# Patient Record
Sex: Male | Born: 1935
Health system: Southern US, Community
[De-identification: ages and names within clinical notes are randomized; demographics above are authoritative.]

## PROBLEM LIST (undated history)

## (undated) DIAGNOSIS — N400 Enlarged prostate without lower urinary tract symptoms: Secondary | ICD-10-CM

## (undated) DIAGNOSIS — Z89619 Acquired absence of unspecified leg above knee: Secondary | ICD-10-CM

## (undated) DIAGNOSIS — J449 Chronic obstructive pulmonary disease, unspecified: Secondary | ICD-10-CM

## (undated) DIAGNOSIS — G629 Polyneuropathy, unspecified: Secondary | ICD-10-CM

## (undated) DIAGNOSIS — F329 Major depressive disorder, single episode, unspecified: Secondary | ICD-10-CM

## (undated) DIAGNOSIS — I10 Essential (primary) hypertension: Secondary | ICD-10-CM

## (undated) DIAGNOSIS — G4730 Sleep apnea, unspecified: Secondary | ICD-10-CM

## (undated) DIAGNOSIS — I739 Peripheral vascular disease, unspecified: Secondary | ICD-10-CM

## (undated) DIAGNOSIS — F419 Anxiety disorder, unspecified: Secondary | ICD-10-CM

## (undated) DIAGNOSIS — I82409 Acute embolism and thrombosis of unspecified deep veins of unspecified lower extremity: Secondary | ICD-10-CM

## (undated) DIAGNOSIS — F32A Depression, unspecified: Secondary | ICD-10-CM

## (undated) DIAGNOSIS — M8700 Idiopathic aseptic necrosis of unspecified bone: Secondary | ICD-10-CM

## (undated) DIAGNOSIS — K219 Gastro-esophageal reflux disease without esophagitis: Secondary | ICD-10-CM

## (undated) HISTORY — PX: BYPASS GRAFT: SHX909

## (undated) HISTORY — DX: Idiopathic aseptic necrosis of unspecified bone: M87.00

## (undated) HISTORY — DX: Acquired absence of unspecified leg above knee: Z89.619

## (undated) HISTORY — PX: BACK SURGERY: SHX140

## (undated) HISTORY — DX: Benign prostatic hyperplasia without lower urinary tract symptoms: N40.0

## (undated) HISTORY — DX: Acute embolism and thrombosis of unspecified deep veins of unspecified lower extremity: I82.409

## (undated) HISTORY — DX: Sleep apnea, unspecified: G47.30

---

## 1986-01-05 HISTORY — PX: ABDOMINAL AORTIC ANEURYSM REPAIR: SUR1152

## 1999-10-28 ENCOUNTER — Encounter: Payer: Self-pay | Admitting: Family Medicine

## 1999-10-28 ENCOUNTER — Encounter: Admission: RE | Admit: 1999-10-28 | Discharge: 1999-10-28 | Payer: Self-pay | Admitting: Family Medicine

## 2000-02-18 ENCOUNTER — Encounter: Payer: Self-pay | Admitting: Gastroenterology

## 2000-02-18 ENCOUNTER — Ambulatory Visit (HOSPITAL_COMMUNITY): Admission: RE | Admit: 2000-02-18 | Discharge: 2000-02-18 | Payer: Self-pay | Admitting: Gastroenterology

## 2004-06-05 HISTORY — PX: LEG AMPUTATION THROUGH KNEE: SHX696

## 2004-07-01 ENCOUNTER — Inpatient Hospital Stay (HOSPITAL_COMMUNITY)
Admission: RE | Admit: 2004-07-01 | Discharge: 2004-07-09 | Payer: Self-pay | Admitting: Physical Medicine & Rehabilitation

## 2004-07-01 ENCOUNTER — Ambulatory Visit: Payer: Self-pay | Admitting: Physical Medicine & Rehabilitation

## 2004-09-15 ENCOUNTER — Ambulatory Visit: Payer: Self-pay | Admitting: Physical Medicine & Rehabilitation

## 2004-09-15 ENCOUNTER — Inpatient Hospital Stay (HOSPITAL_COMMUNITY)
Admission: RE | Admit: 2004-09-15 | Discharge: 2004-09-23 | Payer: Self-pay | Admitting: Physical Medicine & Rehabilitation

## 2004-12-02 ENCOUNTER — Encounter
Admission: RE | Admit: 2004-12-02 | Discharge: 2005-03-02 | Payer: Self-pay | Admitting: Physical Medicine & Rehabilitation

## 2004-12-02 ENCOUNTER — Ambulatory Visit: Payer: Self-pay | Admitting: Physical Medicine & Rehabilitation

## 2004-12-02 ENCOUNTER — Encounter
Admission: RE | Admit: 2004-12-02 | Discharge: 2004-12-02 | Payer: Self-pay | Admitting: Physical Medicine & Rehabilitation

## 2005-01-13 ENCOUNTER — Encounter
Admission: RE | Admit: 2005-01-13 | Discharge: 2005-04-13 | Payer: Self-pay | Admitting: Physical Medicine & Rehabilitation

## 2005-01-26 ENCOUNTER — Ambulatory Visit: Payer: Self-pay | Admitting: Physical Medicine & Rehabilitation

## 2005-03-23 ENCOUNTER — Ambulatory Visit: Payer: Self-pay | Admitting: Physical Medicine & Rehabilitation

## 2005-03-23 ENCOUNTER — Encounter
Admission: RE | Admit: 2005-03-23 | Discharge: 2005-06-21 | Payer: Self-pay | Admitting: Physical Medicine & Rehabilitation

## 2006-06-18 ENCOUNTER — Encounter: Admission: RE | Admit: 2006-06-18 | Discharge: 2006-06-18 | Payer: Self-pay | Admitting: Family Medicine

## 2008-06-25 ENCOUNTER — Encounter: Admission: RE | Admit: 2008-06-25 | Discharge: 2008-06-25 | Payer: Self-pay | Admitting: Family Medicine

## 2010-05-23 NOTE — H&P (Signed)
Mark Russell, Mark Russell               ACCOUNT NO.:  [id number]   MEDICAL RECORD NO.:  [id number]          PATIENT TYPE:  IPS   LOCATION:  4005                         FACILITY:  [facility]   PHYSICIAN:  Ranelle Oyster, M.D.DATE OF BIRTH:  1935/06/12   DATE OF ADMISSION:  09/15/2004  DATE OF DISCHARGE:                                HISTORY & PHYSICAL   CHIEF COMPLAINT:  Left below-knee amputation.   HISTORY OF PRESENT ILLNESS:  This 75 year old white male, well known to me,  is status post right above-knee amputation in June, who was admitted to  [facility] again on August 31 with an ischemic left lower extremity.  Ultimately the patient had a left below-knee amputation performed on  September 1 by Dr. Fredia Sorrow.  The patient developed postoperatively cellulitis  and underwent I&D of his left below-knee amputation and vacuum was required.  The patient has been maintained on various antibiotics for wound coverage  including IV Cipro, Zosyn, and vancomycin for Enterococcus.  The patient has  been up with therapy and is transferring at a moderate assistance level  supine to sit.   REVIEW OF SYSTEMS:  The patient reports weakness, anxiety, depression, and  low back pain.  He is having some phantom pain to the left lower extremity.  He denies right lower extremity phantom pain.   PAST MEDICAL HISTORY:  1.  Peripheral vascular disease.  2.  Gastroesophageal reflux disease.  3.  Obesity.  4.  Lumbar back surgery.  5.  Chronic pain secondary to neuropathy.  6.  Abdominal aortic aneurysm repair in 1988.  7.  Hypertension.  8.  Anxiety and depression.  9.  History of alcohol abuse (remote).   FAMILY HISTORY:  Positive for coronary artery disease, peripheral vascular  disease, and diabetes.   SOCIAL HISTORY:  The patient is married.  A 35-pack-year history of smoking.  He is on disability.  He has a one-level home with a ramp.  He has an  Mining engineer wheelchair and bed with trapeze.  He  has family assistance as  needed.  He has hand controls in his car.   MEDICATIONS:  Include Neurontin, Prozac, Klonopin, Lopressor, OxyIR, Cipro,  Coumadin, OxyContin CR, Celebrex, Nexium, Ultram.   PHYSICAL EXAMINATION:  VITAL SIGNS: Blood pressure 120/70, pulse 80,  respiratory rate 18, temperature 98.  GENERAL:  The patient is pleasant, in no acute distress.  Alert and oriented  x3.  Affect is bright and appropriate.  HEENT:  Pupils equal, round, and reactive to light and accommodation.  Extraocular eye movements are intact.  Ear, nose, and throat examination  unremarkable.  NECK:  Supple without JVD or lymphadenopathy.  CHEST:  Clear to auscultation bilaterally without wheezes, rales, or  rhonchi.  HEART:  Regular rate and rhythm without murmurs, rubs, or gallops.  ABDOMEN:  Soft, nontender.  Bowel sounds are positive.  There is an old  abdominal scar noted.  MUSCULOSKELETAL:  Gait was not tested.  The patient has intact upper  extremity range of motion, intact range of motion of right hip, left knee,  and left hip.  NEUROLOGIC:  Cranial nerves II-XII intact.  Reflexes are 1+.  Sensation is  grossly intact.  Judgment is appropriate as well as orientation.  The  patient's motor function is 5/5 in the upper extremities.  Left lower  extremity is 3 to 4/5 at the hip and knee.  The right hip is 4/5.  EXTREMITIES:  He has an area of erythema along the left arm near the lateral  triceps.  Left BKA wound is clean with some serosanguineous drainage  medially and a vacuum laterally over the wound line.  Right above-knee-  amputation wound is healing with granulation.  Skin is severely puckered.   ASSESSMENT AND PLAN:  1.  Functional deficits secondary to left below-knee amputation September 1,      right above-knee amputation in June 2006.  The patient is admitted for      comprehensive inpatient rehabilitation.  Estimated length of stay is 10      days.  Prognosis is fair to good.   Goals are at the wheelchair level.      We hope to achieve modified independence.  2.  Pain management with OxyContin CR 40 mg q.12 h., oxycodone p.r.n.,      Neurontin 300 mg t.i.d.  3.  Deep vein thrombosis prophylaxis with subcutaneous Lovenox at 40 mg a      day.  4.  Hypertension: Lopressor.  5.  Depression and anxiety:  Continue Prozac and Klonopin.  6.  Diabetes: Continue CBG checks.  7.  Wound care: Continue VAC treatments to left leg and wet-to-dry      treatments right thigh.  8.  Infectious disease:  Continue doxycycline and Cipro for now.  Discuss      duration with infectious disease or surgical team at [facility].  9.  Gastroesophageal reflux disease: Continue Protonix.      Ranelle Oyster, M.D.  Electronically Signed     ZTS/MEDQ  D:  09/15/2004  T:  09/15/2004  Job:  [id number]

## 2010-05-23 NOTE — Assessment & Plan Note (Signed)
Mark Russell is back regarding his bilateral above-knee amputations.  He  continues to be involved with outpatient PT.  He is progressing slowly.  He  has been a bit discouraged by his progress to this point.  He has walked a  few feet for therapy in the support harness but has not done much more than  that.  He still complains of phantom pain which is more persistent at night  time.  He also notes some while he is up and walking in his prosthetic  limbs.  Skin is intact.  No changes in medications have been made.  He  continues to struggle with his weight.  He has weak upper extremities  relatively speaking as well.  Sleep is fair.   REVIEW OF SYSTEMS:  Positive for numbness, tingling, phantom pain.  Other  pertinent positives as above, and a full review is in the health and history  section of the chart.   PHYSICAL EXAMINATION:  VITAL SIGNS:  Blood pressure is 113/61, pulse is 88,  respiratory rate is 16, he is saturating 97% on room air.  GENERAL:  The patient is pleasant, in no acute distress.  He is alert and  oriented x3.  Affect is bright.  MUSCULOSKELETAL/NEUROLOGIC:  Gait is stable.  Coordination is fair.  Reflexes are 2+.  Sensation is fair.  Residual limbs are intact without  signs of breakdown.  He has fair range of motion.  Rotator cuff signs were  equivocal on both shoulders.  Strength is 4+/5/5 throughout still today.  CARDIAC:  Heart is regular rate and rhythm.  LUNGS:  Clear.   ASSESSMENT:  1.  Status post bilateral above-knee amputations.  The patient should      continue with outpatient physical therapy.  I have nothing further to      offer at this point.  I did encourage exercises with the trainer at the      [facility] to increase upper body strength and cardiovascular endurance.  He      needs to work on his diet as well as far as changing the type and the      quantity of food he is eating.  He is limited with his aerobic exercise,      obviously, due to his amputations.   I think he has realistic      expectations regarding his legs and what he will be able to do with the      prosthetic limbs.  He remains motivated.  I hope he continues to      progress with his mobility.  2.  I asked to see the patient on an as-needed basis in the future.  Nothing      new to offer at this point.  The patient agreed.      Ranelle Oyster, M.D.  Electronically Signed     ZTS/MedQ  D:  03/24/2005 15:02:33  T:  03/25/2005 15:51:11  Job #:  [id number]

## 2010-05-23 NOTE — Assessment & Plan Note (Signed)
HISTORY OF PRESENT ILLNESS:  Mark Russell is here in follow up of his bilateral  above-the-knee amputations which took place over the summer months after  multiple histories with wound healing, etc.  The legs have finally healed.  The patient is here for prosthetic evaluation.  It has been some time since  the patient has actually ambulated.  Even before his amputations, he was  walking on a limited basis due to pain in the limbs related to claudication.  The patient is anxious to get up on prosthetic limbs.  He has reasonable  expectations about what he would like to accomplish.  He states he would  like to be able to ambulate to the bathroom and short distances within the  house and possibly into church.  He does have a power scooter.  He has no  manual wheelchair at home.   CURRENT MEDICATIONS:  1.  Neurontin 600 mg t.i.d.  2.  Coumadin 5 mg daily.  3.  Klonopin 0.5 mg daily.  4.  Prozac 10 mg daily.  5.  Capozide 50/25 mg daily.  6.  Valium 10 mg daily.   REVIEW OF SYSTEMS:  Negative for any new pain complaints.  Denies any GU, GI  or cardiorespiratory issues today.   SOCIAL HISTORY:  The patient's wife is with him today.  She is very  supportive.   PHYSICAL EXAMINATION:  VITAL SIGNS:  Blood pressure 124/82, pulse 86,  respirations 16, saturating 97% on room air.  EXTREMITIES:  The patient's upper extremity strength is within normal limits  except for some pain with rotator cuff movements bilaterally.  The patient  is able to transfer from his wheelchair to the table with contact guard and  minimal assistance.  He had full passive and active range of motion of both  hips today.  Multiple scars are noted along with lower abdomen, inguinal  regions and legs.  The right leg wound is convoluted and has some adhesions  along with femur but is intact and without signs of breakdown.  Left leg  wound is much smoother and regular in appearance.  Both areas were nontender  to palpation  today.  The patient had 4+ to 5/5 strength in both legs today.  He gets short of breath with his transferring activities in the room today.  HEART:  Regular rate and rhythm.  LUNGS:  Clear.  ABDOMEN:  Soft, nontender.  The patient seems to have gained some weight  since we last saw him.   ASSESSMENT:  Status post bilateral above-the-knee amputations.  The  patient's a K2+ ambulator.  He is very determined to get up on his legs for  mobility for short distances.  He might be able to maneuver using his power  chair, since it is able to elevate to an adequate height to allow transfers.   PLAN:  [facility].  We will be constructing bilateral above-the-  knee prostheses with a pelvic suspension Velcro strap system and 3R-60  knees.  [facility] will attempt to use a [place] light foot to allow spring  and anterior weight shift.  The patient was fitted with stump shrinkers  today.  Will see [facility] for fitting, and see [place] back here at the office  for checkout in 4-6 weeks time.     Ranelle Oyster, M.D.  Electronically Signed    ZTS/MedQ  D:  12/02/2004 16:38:29  T:  12/03/2004 07:22:44  Job #:  [id number]

## 2010-05-23 NOTE — Assessment & Plan Note (Signed)
HISTORY OF PRESENT ILLNESS:  [name] is here for follow up of his  bilateral above-the-knee amputations.  He as fitted with bilateral DR60  knees with Velcro strap systems and [place] light feet.  The patient has  been to four therapy sessions thus far, after fitting, and has been able to  stand for approximately 10 minutes in the [employer] system.  System has  essentially been supporting 50 of his 236 pounds.  He has had some pain in  the right residual limb since he has been fitted with the prosthetic limbs  and has been up on the legs.  He complains of the pain being in the distal  end.  Describes as a throbbing, burning pain.  Denies any pins and needles  sensations.  The pain averages a 6/10 when it peaks.  The pain interferes  with general activity, relationship with others, enjoyment of life on a mild  to moderate level.  The patient had been on oxycodone previously for pain,  but is not taking anything specifically now except for Neurontin for  neuropathic symptoms.   On review of systems, pertinent positive as listed above.  Full review is in  the Health and History section.   SOCIAL HISTORY:  Has not changed.  His wife is supportive.   PHYSICAL EXAMINATION:  VITAL SIGNS:  Blood pressure 120/84, pulse 82,  respirations 16.  GENERAL APPEARANCE:  The patient is pleasant, no acute distress.  EXTREMITIES:  Upper extremity strength is stable except for some rotator  cuff symptoms bilaterally.  The right leg was clean and intact.  He had some  __________ in the wound line, but no breakdown was seen.  The posterior  femur was tender with palpation along the lateral aspect, and this  reproduced his pain when pressed upon today.  Left leg was intact and no  signs of breakdown.  Both liners seemed to be fitting appropriately.  Strength was generally 4+ to 5/5 in both lower extremities today.  HEART:  Regular rate and rhythm.  LUNGS:  Clear.   ASSESSMENT:  Status post bilateral  above-the-knee amputations.  Continue  with outpatient physical therapy.  [facility] will assess socket  and likely put some distal padding in to allow for further cushioning of the  posterior femur.  This will be built into the socket itself.  No other  changes will be made in the suspension system or liner today.   Again, the patient too the Ultram to take as needed during the day and also  30 minutes prior to therapy to help with his pain tolerance.   I asked him to follow up in two months time, if he is still having pain  issues at that point.  I suspect these will improve as he wears the sockets  more and appropriate padding is provided.      Ranelle Oyster, M.D.  Electronically Signed     ZTS/MedQ  D:  01/27/2005 13:59:46  T:  01/28/2005 06:36:56  Job #:  [id number]

## 2010-05-23 NOTE — Discharge Summary (Signed)
NAMEPHU, RECORD               ACCOUNT NO.:  [id number]   MEDICAL RECORD NO.:  [id number]          PATIENT TYPE:  IPS   LOCATION:  4005                         FACILITY:  [facility]   PHYSICIAN:  Ranelle Oyster, M.D.DATE OF BIRTH:  July 25, 1935   DATE OF ADMISSION:  09/15/2004  DATE OF DISCHARGE:  09/23/2004                                 DISCHARGE SUMMARY   DISCHARGE DIAGNOSES:  1.  Left below-knee amputation September 05, 2004, secondary to peripheral      vascular disease.  2.  Postoperative cellulitis of below-knee amputation, irrigation and      debridement and V.A.C. placement on September 11, 2004.  3.  VRE-MRSA with contact precautions.  4.  Chronic pain syndrome.  5.  Subcutaneous Lovenox for deep venous thrombosis prophylaxis.  6.  Hypertension.  7.  Depression.  8.  Right above-knee amputation.  9.  Non-insulin-dependent diabetes mellitus.  10. Gastroesophageal reflux disease.  11. Hyperlipidemia.   HISTORY OF PRESENT ILLNESS:  This is a 75 year old white male with history  of peripheral vascular disease, right above-knee amputation June 10, 2004 of  which he received inpatient rehab services for. Now admitted to [facility] September 04, 2004 with severe ischemic changes of  the left lower extremity, a gangrenous left great toe.  The patient on  chronic Coumadin prior to admission for deep venous thrombosis.  Underwent a  left below-knee amputation September 1 for gangrenous left fore foot and  findings of chronically occluded aortobifemoral graft, September 1 by Dr.  Everardo Beals.  Postoperative cellulitis, left above-knee amputation stump.  Underwent irrigation and debridement with V.A.C. placement.  As of September  7, he was maintained on Cipro and doxycycline for positive wound cultures of  Enterococcus.  He was admitted for comprehensive rehab program.   PAST MEDICAL HISTORY:  See discharge diagnoses.   SOCIAL HISTORY:  Married.  He is 35-pack-year  smoker.  He uses social  alcohol.  He is on disability.  They live in a one-level home with a ramp.  He has an Passenger transport manager.  Family assistance is needed.  He has a car  with hand controls. He has not used a prosthesis for right above-knee  amputation due to slow healing.   MEDICATIONS PRIOR TO ADMISSION:  1.  Neurontin 300 mg twice daily.  2.  Prozac 10 mg daily.  3.  Klonopin 0.5 mg daily.  4.  Lopressor 25 mg twice daily.  5.  Oxycodone as needed.  6.  Cipro 750 mg twice daily.  7.  Coumadin 5 mg daily.  8.  OxyContin sustained release 10 mg twice daily.  9.  Celebrex 200 mg daily.  10. Nexium 20 mg daily.   HOSPITAL COURSE:  The patient with progressive gains while in rehab services  with therapies initiated on a twice a day basis.  The following issues are  followed during the patient's rehab course.   Pertaining to Mr. Bhola left above-knee amputation, surgical site healing  slowly.  Routine dressing changes.  His V.A.C. had recently been  discontinued.  He was receiving wet-to-dry  dressing with packing to the left  below-knee amputation site daily as needed with a nurse to be arranged for  home.  Right above-knee amputation as of July 2006 with good granulation  tissue.  He was receiving normal saline wet-to-dry dressing dressings.  Again a home health nurse has been arranged.  He remained no Cipro and  doxycycline for positive wound cultures. He remained afebrile.  He will  follow up with Dr. Everardo Beals on recommendations duration of antibiotics.  Chronic pain control with OxyContin sustained release 40 mg every 12 hours.  Neurontin 300 mg three times daily and oxycodone for breakthrough pain.  He  remained subcutaneous Lovenox for deep venous thrombosis prophylaxis  throughout his rehab course.  He had been on chronic Coumadin in the past  for a history of deep venous thrombosis.  Now that the patient was bilateral  amputee, again it would be at the discretion of Dr.  Everardo Beals when to resume  Coumadin therapy if at all.  His blood pressure remained well controlled on  Lopressor.  Blood sugars overall remained well controlled.  A hemoglobin A1C  that was checked was 6.3.  Blood sugars were 109 and 112.  He remained on  his Nexium for gastroesophageal reflux disease, his Lipitor for  hyperlipidemia.  Overall he did cooperate with his therapies. He was  modified independent for wheelchair mobility, moderate bed-to-chair  transfers. No prosthesis at this time due to his healing wounds although he  would follow up in the amputee clinic as advised.   LABORATORY DATA:  Latest labs showed hemoglobin 11.7,  hematocrit 35.4,  platelet count 565,000.  Sodium 141, potassium 3.6, BUN 23, creatinine 1.1.   DISCHARGE MEDICATIONS:  1.  Vitamin C 1000 mg twice daily.  2.  Os-Cal 500 mg three times daily.  3.  Klonopin 0.5 mg daily.  4.  Neurontin 300 mg three times daily.  5.  Magnesium oxide had been discontinued.  6.  Lopressor 25 mg twice daily.  7.  Nexium 20 mg daily.  8.  Lipitor 20 mg daily.  9.  Doxycycline 100 mg twice daily.  10. Cipro 750 mg twice daily.  11. Capozide 50/25 daily.  12. Prozac 10 mg daily.  13. OxyContin sustained release 40 mg every 12 hours.  14. Oxycodone as needed for breakthrough pain.  15. Potassium chloride 20 mEq twice daily.  16. Multivitamin daily.   ACTIVITY:  As tolerated.   DIET:  No added sweets.   FOLLOW UP:  He would follow up with Dr. Riley Kill at the outpatient rehab  service amputee clinic on December 02, 2004.  Home health nurse has been  arranged for dressing changes, wet-to-dry dressing packing left below-knee  site daily as needed.  Normal saline wet-to-dry dressing right above-knee  site daily as needed.      Mariam Dollar, P.A.      Ranelle Oyster, M.D.  Electronically Signed    DA/MEDQ  D:  09/23/2004  T:  09/23/2004  Job:  [id number]   cc:   Gretta Arab. Valentina Lucks, M.D. [address] [place]  [place] [address]  Fax: [phone]   Arman Filter, M.D.   Dr. Everardo Beals  [facility]

## 2010-05-23 NOTE — Procedures (Signed)
Mark Russell. [facility]  Patient:    Mark Russell, Mark Russell                        MRN: [id number] Proc. Date: 02/18/00 Attending:  Verlin Grills, M.D. CC:         Gretta Arab. Valentina Lucks, M.D.   Procedure Report  REFERRING PHYSICIAN:  Gretta Arab. Valentina Lucks, M.D.  DATE OF BIRTH:  1935-12-28  ENDOSCOPIST:  Verlin Grills, M.D.  PROCEDURE PERFORMED:  Esophagogastroduodenoscopy with Savary esophageal dilation.  INDICATIONS FOR PROCEDURE:  The patient is a 75 year old male who is undergoing diagnostic esophagogastroduodenoscopy to evaluate chronic solid food dysphagia.  Mr. Warzecha has had heartburn for years.  He uses a combination of baking soda and Zantac.  His heartburn was most severe when he was consuming alcohol which he has now stopped consuming.  He was diagnosed with a peptic ulcer years ago.  Mr. Stupka has experienced intermittent solid food dysphagia for years.  He denies associated weight loss, gastrointestinal bleeding, odynophagia or vomiting.  Mr. Balling is on chronic Coumadin therapy.  He has severe peripheral vascular disease and has an number of peripheral vascular grafts.  He is reluctant to stop his Coumadin for his esophageal dilation.  Mr. Broadwater is familiar with esophagogastroduodenoscopy because his wife has undergone the procedure.  I have discussed with him the complications associated with esophageal dilation including intestinal bleeding and intestinal perforation.  Mr. Lemme has signed the operative permit.  Mr. Jefferys did receive 2 gm intravenous ampicillin prior to the procedure because of the presence of his peripheral vascular grafts.  MEDICAL ALLERGIES:  None.  CHRONIC MEDICATIONS:   Capozide, Klonopin, Prozac, Coumadine, Lodine, MS-Contin, Zantac and Valium.  PAST MEDICAL HISTORY:  Hypertension, degenerative joint disease, peripheral vascular disease, ruptured disk in his back requiring surgery, right axillofemoral  bypass graft, femoral-femoral bypass graft, right femoral-popliteal bypass graft, hypercholesterolemia.  HABITS:  Mr. Ealy does not smoke cigarets.  He quit consuming alcohol years ago.  SOCIAL HISTORY:  Mr. Dubs is married.  His 61 year old wife recently had coronary bypass surgery.  His daughters (ages 63, 65 and 95) and his son age 34 are all in good health.  PREMEDICATION:  Fentanyl 100 mcg, Versed 12.5 mg,  ENDOSCOPE:  Olympus gastroscope.  16 mm Savary dilator.  DESCRIPTION OF PROCEDURE:  After obtaining informed consent, the patient was placed in the left lateral decubitus position on the fluoroscopy table.  I administered intravenous fentanyl and intravenous Versed to achieve conscious sedation for the procedure.  The patients blood pressure, oxygen saturation and cardiac rhythm were monitored throughout the procedure and documented in the medical record.  The Olympus gastroscope was passed through the posterior hypopharynx into the proximal esophagus without difficulty.  The hypopharynx, larynx and vocal cords appeared normal.  Esophagoscopy:  The proximal and midsegments of the esophagus appeared normal. There is a benign minimally obstructing stricture at the esophagogastric junction unassociated with erosive esophagitis, Barretts esophagus, esophageal ulcers or esophageal tumors.  Gastroscopy:  A retroflex view of the gastric cardia and fundus was normal. The diaphragmatic hiatus is somewhat patulous.  The gastric body, antrum and pylorus appeared normal.  Duodenoscopy:  The duodenal bulb and descending duodenum appeared normal.  Savary esophageal dilation:  The Savary dilator wire was passed through the endoscope and the tip of the guide wire advanced to the distal gastric antrum as confirmed both endoscopically and fluoroscopically.  Under fluoroscopic guidance, a  16 mm Savary dilator passed without resistance.  Repeat esophagogastroscopy following Savary  esophageal dilation confirmed satisfactory dilation of the benign peptic stricture at the esophagogastric junction and no evidence of gastric trauma due to the guide wire.  Despite the patient being on Coumadin, there was not an excessive amount of bleeding with the esophageal dilation.  ASSESSMENT:  Benign stricture, probably secondary to chronic gastroesophageal reflux, noted at the esophagogastric junction and unassociated with erosive esophagitis, Barretts esophagus, esophageal tumors or esophageal ulcers.  The benign distal esophageal stricture was dilated with a 16 mm Savary dilator.  RECOMMENDATIONS:  I have encouraged Mr. Pertuit to discontinue baking soda.  I would recommend switching him from Zantac to Prilosec to prevent heartburn. His wife is currently on Prilosec. DD:  02/18/00 TD:  02/18/00 Job: [id number]

## 2010-05-23 NOTE — H&P (Signed)
NAMEVALENTINO, Mark Russell               ACCOUNT NO.:  [id number]   MEDICAL RECORD NO.:  [id number]          PATIENT TYPE:  IPS   LOCATION:  4011                         FACILITY:  [facility]   PHYSICIAN:  Ranelle Oyster, M.D.DATE OF BIRTH:  1935/11/27   DATE OF ADMISSION:  07/01/2004  DATE OF DISCHARGE:                                HISTORY & PHYSICAL   CHIEF COMPLAINT:  Right leg pain and amputation.   HISTORY OF PRESENT ILLNESS:  This is a 75 year old white male with a history  of significant peripheral vascular disease who failed a lower extremity  bypass procedure secondary to thrombosis and infection.  Ultimately, the  right lower extremity required right above knee amputation and I&D of his  distal right fem-pop bypass graft on June 19.  This surgery was performed by  Dr. Everardo Beals.  The patient had some postoperative pain control which is  generally improved on OxyContin.  The patient continues on antibiotics for  wound prophylaxis which includes Cipro and minocycline.  The patient, upon  last therapy evaluation, was min assist for sliding board transfer, mod to  max assist for pivot transfers, and min to mod assist for lower body  dressing.   REVIEW OF SYSTEMS:  The patient reports reflux, some cough, and anxiety.  He  states his pain is much better controlled.  Full review of systems is in the  written H&P.   PAST MEDICAL HISTORY:  1.  Peripheral vascular disease as above.  2.  Gastroesophageal reflux.  3.  Obesity.  4.  Lumbar surgery in 1988 and 1989.  5.  Chronic pain due to neuropathy.  6.  AAA repair.   FAMILY HISTORY:  Positive for coronary artery disease.   SOCIAL HISTORY:  The patient is married, has a 35-pack year history of  smoking.  He is disabled.  He has a history of alcohol abuse.  He has not  smoked in nine years.   The patient can ambulate short distances with his cane and was not  wheelchair bound until recently when the foot became increasingly  effected.   MEDICATIONS PRIOR TO ADMISSION:  Neurontin, Prozac, Valium, Klonopin,  Prilosec, Cipro, minocycline, OxyContin, and oxycodone p.r.n.   ALLERGIES:  No known drug allergies.   LABORATORY VALUES:  Include a hemoglobin of 10.6, white count 10.5,  platelets 596.  Sodium 142, potassium 4.2, BUN and creatinine 11 and 0.5.   PHYSICAL EXAMINATION:  VITAL SIGNS:  Today the patient's blood pressure was  120/84, pulse 88, respiratory rate 16.  GENERAL:  The patient is a pleasant, obese male who is alert and oriented.  HEENT:  Pupils equal, round, reactive to light and accommodation.  Extraocular eye movements are intact.  NECK:  Supple without JVD or lymphadenopathy.  RESPIRATORY:  Revealed normal auscultation without wheezes, rales, or  rhonchi.  HEART:  Regular rate and rhythm without murmurs, rubs, or gallops.  ABDOMEN:  Soft, nontender.  Bowel sounds are positive.  EXTREMITIES:  Right leg revealed the wound to be well approximated.  He had  some serosanguineous drainage centrally and on the lateral border  of the  wound.  He had a scar along the medial thigh as well with sutures which was  intact, some other residual scarring was noted with skin generally looking  well.  Pulses were 1+ and 2+ bilateral upper extremities.  Left pedal pulse  was trace.  NEUROLOGIC:  The patient had normal cognition.  Cranial nerves II-XII are  intact.  Reflexes are 1+.  Sensation decreased in the left foot and ankle.  Mood was appropriate.  Strength was 5/5 both upper extremities.  He is 4/5  right lower extremity and 4+ to 5/5 left lower extremity .   ASSESSMENT/PLAN:  1.  Functional deficit secondary to severe peripheral vascular disease and a      new right above the knee amputation, postoperative day number eight.  He      had inpatient rehabilitation including physical therapy, occupational      therapy, registered nurse, medical doctor, and case management.      1.  Goals:  Modified  independent at the wheelchair level.      2.  Estimated length of stay is 7+ days.      3.  Prognosis is good.  2.  Pain management with oxycodone, controlled release and as needed      oxycodone.  3.  Deep vein thrombosis prophylaxis with Coumadin.  4.  Hypertension.  Continue with Capozide and Lopressor.  5.  Wound prophylaxis.  Continue Cipro and minocycline.  6.  Wound care.  Continue dry dressing.  The wound is stable.  We will begin      shrinker once the wound drainage decreases to a certain extent.       ZTS/MEDQ  D:  07/01/2004  T:  07/01/2004  Job:  [id number]

## 2010-05-23 NOTE — Discharge Summary (Signed)
Mark Russell, Mark Russell               ACCOUNT NO.:  [id number]   MEDICAL RECORD NO.:  [id number]          PATIENT TYPE:  IPS   LOCATION:  4011                         FACILITY:  [facility]   PHYSICIAN:  Ranelle Oyster, M.D.DATE OF BIRTH:  [birth year]   DATE OF ADMISSION:  07/01/2004  DATE OF DISCHARGE:  07/09/2004                                 DISCHARGE SUMMARY   DISCHARGE DIAGNOSES:  1.  Peripheral vascular disease with gangrenous changes, right lower      extremity, requiring a right above knee amputation.  2.  Hypertension.  3.  Chronic pain secondary to neuropathy.  4.  Anxiety.   HISTORY OF THE PRESENT ILLNESS:  Mark Russell is a 75 year old make with a  history of peripheral vascular disease, previous bypass procedure, secondary  thrombosis and infection with exacerbation __________  with ischemic  necrosis of the right foot despite antibiotic suppression.  The patient was  admitted to __________  on June 22, 2004 with right AKA and irrigation and  debridement of distal right fem-pop bypass graft on June 23, 2004 by Dr.  __________ .  Postop the patient was placed on Coumadin secondary to his  history of thrombosis; INR 2.1.  The patient was maintained on IV vancomycin  until June 30, 2004.  He currently continues on __________  and Cipro  chronically.  Pain control continued to be an issue.  The patient's mobility  is also noted to be __________ .  He is moderate-to-maximum assistance for  transfers, minimal-to5modest assistance for ADLs.  Rehab was consulted for  further therapy.   PAST MEDICAL HISTORY:  The past medical history is significant for:  1.  Severe peripheral vascular disease with multiple bypass grafts and I&Ds.  2.  GERD.  3.  Obesity.  4.  Lumbar surgery in 1988 with redo in 1989.  5.  Chronic pain.  6.  Abdominal aortic aneurysm repair.   ALLERGIES:  No known drug allergies.   SOCIAL HISTORY:  The patient is married and has been disabled  secondary to  back  problems.  He has a 25 pack/year tobacco use history and quit nine  years ago.  He has a history of alcohol abuse in the past.  The patient was  ambulating short distances until three months ago and has been wheelchair  bound most recently.   HOSPITAL COURSE:  Mark Russell was admitted to rehab on July 01, 2004  for inpatient therapies to consist of PT/OT daily.  After admission he was  maintained on Coumadin.  The patient has multiple pain control issues.  He  was started on OxyContin and Oxy-IR at low doses.  The night after admission  the patient was noted to have increasing confusion with sundowning.  Oxycodone was discontinued with hydrocodone used initially.  Ultram was also  added to help with pain and neuropathy.  Celebrex was added to help  potentiate narcotics with efficacy.  The patient continued to have pain  control issues therefore he was changed back to oxycodone without any  recurrent confusion or delusions.  Anxiety has been an issue.  He was  started on low-dose Klonopin, which has helped this overall.   Follow up labs done after admission revealed hemoglobin 11.4, hematocrit  34.0, white count 11.3 and platelets 63,000.  Check of lytes revealed sodium  140, potassium 3.3, chloride 104, CO2 28, BUN 9, creatinine 0.7, and glucose  119.  His hypokalemia was supplemented .  The patient has urine cultures  done in July 02, 2004 and July 07, 2004, which were noted to be negative.  The right BKA site was noted to be boggy with a moderate amount of  serosanguineous drainage. There was a concern regarding wounds, especially  with __________  and some dehiscence with laxity of the wound edges  medially.  [facility] was contacted regarding follow up appointment for wound care.  The patient was setup to follow up with Dr. __________  Demetrius Charity.M. July 10, 2004  at 10 A.M. in general surgery clinic for reevaluation of wound and for input  on any changes in antibiotics and treatment.    During his stay in rehab [name]  progressed along well.  He was at close  supervision to minimal assistance for transfers, supervision for __________  transfers; ambulation not tested.  He was modified independent for upper  body care, minimal assistance for lower body care and at supervision for  toileting.  Further follow up therapies were setup including home health  PT/OT by [facility] and home health [facility] for wound care.   On July 10, 2002 the patient is discharged to home with recommendations to  follow up with Dr. __________  on July 10, 2004.   DISCHARGE MEDICATIONS:  1.  Captopril hydrochlorothiazide 50/25 per day.  2.  Celebrex 200 mg a day.  3.  Cipro 750 mg twice a day,  4.  Klonopin 0.5 mg half in the A.M. and1 in the P.M.  5.  Prozac 10 mg a day.  6.  Neurontin 300 mg q.i.d.  7.  __________  100 mg twice a day.  8.  Lopressor 50 mg 1/2 tab twice a day.  9.  Prilosec 20 mg twice a day.  10. OxyContin CR 10 mg q.12 hours.  11. Ultram 50 mg twice a day.  12. Oxy-IR 5-10 mg q.4-6 hours p.r.n. pain.  13. Coumadin 2 mg 2 p.o. q.p.m.  14. K-Dur 20 mEq a day.   DIET:  Low-salt.   ACTIVITY:  Activities are at wheelchair level with supervision and  assistance p.r.n.   WOUND CARE:  Cleanse right wound with antibacterial soap and water, pat dry  and change the dressings on daily to twice a day basis.   FOLLOW UP:  The patient is to follow up with Dr., __________  on July 10, 2004.  Follow up with Dr. Riley Kill on September 17, 2004.      Greg Cutter, P.A.      Ranelle Oyster, M.D.  Electronically Signed    PP/MEDQ  D:  09/25/2004  T:  09/26/2004  Job:  [id number]   cc:   Gretta Arab. Valentina Lucks, M.D.  Fax: [phone]

## 2010-11-03 ENCOUNTER — Emergency Department (HOSPITAL_COMMUNITY): Payer: Medicare Other

## 2010-11-03 ENCOUNTER — Emergency Department (HOSPITAL_COMMUNITY)
Admission: EM | Admit: 2010-11-03 | Discharge: 2010-11-03 | Disposition: A | Discharge status: Home / Self Care | Payer: Medicare Other | Attending: Emergency Medicine | Admitting: Emergency Medicine

## 2010-11-03 DIAGNOSIS — R109 Unspecified abdominal pain: Secondary | ICD-10-CM | POA: Insufficient documentation

## 2010-11-03 DIAGNOSIS — E119 Type 2 diabetes mellitus without complications: Secondary | ICD-10-CM | POA: Insufficient documentation

## 2010-11-03 DIAGNOSIS — S78119A Complete traumatic amputation at level between unspecified hip and knee, initial encounter: Secondary | ICD-10-CM | POA: Insufficient documentation

## 2010-11-03 DIAGNOSIS — Z9889 Other specified postprocedural states: Secondary | ICD-10-CM | POA: Insufficient documentation

## 2010-11-03 DIAGNOSIS — I10 Essential (primary) hypertension: Secondary | ICD-10-CM | POA: Insufficient documentation

## 2010-11-03 DIAGNOSIS — I739 Peripheral vascular disease, unspecified: Secondary | ICD-10-CM | POA: Insufficient documentation

## 2010-11-03 DIAGNOSIS — R1310 Dysphagia, unspecified: Secondary | ICD-10-CM | POA: Insufficient documentation

## 2010-11-03 DIAGNOSIS — E785 Hyperlipidemia, unspecified: Secondary | ICD-10-CM | POA: Insufficient documentation

## 2010-11-04 ENCOUNTER — Emergency Department (HOSPITAL_COMMUNITY): Payer: Medicare Other

## 2010-11-04 ENCOUNTER — Emergency Department (HOSPITAL_COMMUNITY)
Admission: EM | Admit: 2010-11-04 | Discharge: 2010-11-04 | Disposition: A | Discharge status: Home / Self Care | Payer: Medicare Other | Attending: Emergency Medicine | Admitting: Emergency Medicine

## 2010-11-04 DIAGNOSIS — T18108A Unspecified foreign body in esophagus causing other injury, initial encounter: Secondary | ICD-10-CM | POA: Insufficient documentation

## 2010-11-04 DIAGNOSIS — E785 Hyperlipidemia, unspecified: Secondary | ICD-10-CM | POA: Insufficient documentation

## 2010-11-04 DIAGNOSIS — K208 Other esophagitis without bleeding: Secondary | ICD-10-CM | POA: Insufficient documentation

## 2010-11-04 DIAGNOSIS — I739 Peripheral vascular disease, unspecified: Secondary | ICD-10-CM | POA: Insufficient documentation

## 2010-11-04 DIAGNOSIS — I10 Essential (primary) hypertension: Secondary | ICD-10-CM | POA: Insufficient documentation

## 2010-11-04 DIAGNOSIS — Z7901 Long term (current) use of anticoagulants: Secondary | ICD-10-CM | POA: Insufficient documentation

## 2010-11-04 DIAGNOSIS — E119 Type 2 diabetes mellitus without complications: Secondary | ICD-10-CM | POA: Insufficient documentation

## 2010-11-04 DIAGNOSIS — Z79899 Other long term (current) drug therapy: Secondary | ICD-10-CM | POA: Insufficient documentation

## 2010-11-04 DIAGNOSIS — K2961 Other gastritis with bleeding: Secondary | ICD-10-CM | POA: Insufficient documentation

## 2010-11-04 LAB — CBC
HCT: 47.9 % (ref 39.0–52.0)
Hemoglobin: 15.8 g/dL (ref 13.0–17.0)
MCH: 32.5 pg (ref 26.0–34.0)
MCHC: 33 g/dL (ref 30.0–36.0)
MCV: 98.6 fL (ref 78.0–100.0)
Platelets: 381 10*3/uL (ref 150–400)
RBC: 4.86 MIL/uL (ref 4.22–5.81)
RDW: 15 % (ref 11.5–15.5)
WBC: 14 10*3/uL — ABNORMAL HIGH (ref 4.0–10.5)

## 2010-11-04 LAB — POCT I-STAT, CHEM 8
BUN: 24 mg/dL — ABNORMAL HIGH (ref 6–23)
Calcium, Ion: 1.1 mmol/L — ABNORMAL LOW (ref 1.12–1.32)
Chloride: 100 mEq/L (ref 96–112)
Creatinine, Ser: 0.9 mg/dL (ref 0.50–1.35)
Glucose, Bld: 147 mg/dL — ABNORMAL HIGH (ref 70–99)
HCT: 51 % (ref 39.0–52.0)
Hemoglobin: 17.3 g/dL — ABNORMAL HIGH (ref 13.0–17.0)
Potassium: 3.2 mEq/L — ABNORMAL LOW (ref 3.5–5.1)
Sodium: 144 mEq/L (ref 135–145)
TCO2: 33 mmol/L (ref 0–100)

## 2010-11-04 MED ORDER — IOHEXOL 300 MG/ML  SOLN
100.0000 mL | Freq: Once | INTRAMUSCULAR | Status: AC | PRN
  Administered 2010-11-04: 100 mL via INTRAVENOUS

## 2011-08-06 ENCOUNTER — Inpatient Hospital Stay (HOSPITAL_COMMUNITY)
Admission: EM | Admit: 2011-08-06 | Discharge: 2011-08-15 | DRG: 871 | Disposition: A | Discharge status: Home Health | Payer: Medicare Other | Attending: Family Medicine | Admitting: Family Medicine

## 2011-08-06 ENCOUNTER — Encounter (HOSPITAL_COMMUNITY): Payer: Self-pay | Admitting: *Deleted

## 2011-08-06 ENCOUNTER — Emergency Department (HOSPITAL_COMMUNITY): Payer: Medicare Other

## 2011-08-06 DIAGNOSIS — I4891 Unspecified atrial fibrillation: Secondary | ICD-10-CM | POA: Diagnosis not present

## 2011-08-06 DIAGNOSIS — D72829 Elevated white blood cell count, unspecified: Secondary | ICD-10-CM

## 2011-08-06 DIAGNOSIS — I739 Peripheral vascular disease, unspecified: Secondary | ICD-10-CM | POA: Diagnosis present

## 2011-08-06 DIAGNOSIS — I82409 Acute embolism and thrombosis of unspecified deep veins of unspecified lower extremity: Secondary | ICD-10-CM | POA: Diagnosis present

## 2011-08-06 DIAGNOSIS — G929 Unspecified toxic encephalopathy: Secondary | ICD-10-CM | POA: Diagnosis present

## 2011-08-06 DIAGNOSIS — G92 Toxic encephalopathy: Secondary | ICD-10-CM | POA: Diagnosis present

## 2011-08-06 DIAGNOSIS — Z66 Do not resuscitate: Secondary | ICD-10-CM | POA: Diagnosis present

## 2011-08-06 DIAGNOSIS — E860 Dehydration: Secondary | ICD-10-CM

## 2011-08-06 DIAGNOSIS — Z87891 Personal history of nicotine dependence: Secondary | ICD-10-CM

## 2011-08-06 DIAGNOSIS — Z993 Dependence on wheelchair: Secondary | ICD-10-CM

## 2011-08-06 DIAGNOSIS — E662 Morbid (severe) obesity with alveolar hypoventilation: Secondary | ICD-10-CM | POA: Diagnosis present

## 2011-08-06 DIAGNOSIS — Z86718 Personal history of other venous thrombosis and embolism: Secondary | ICD-10-CM

## 2011-08-06 DIAGNOSIS — A419 Sepsis, unspecified organism: Principal | ICD-10-CM | POA: Diagnosis present

## 2011-08-06 DIAGNOSIS — E119 Type 2 diabetes mellitus without complications: Secondary | ICD-10-CM | POA: Diagnosis present

## 2011-08-06 DIAGNOSIS — E872 Acidosis, unspecified: Secondary | ICD-10-CM | POA: Diagnosis present

## 2011-08-06 DIAGNOSIS — Z7901 Long term (current) use of anticoagulants: Secondary | ICD-10-CM

## 2011-08-06 DIAGNOSIS — E876 Hypokalemia: Secondary | ICD-10-CM | POA: Diagnosis not present

## 2011-08-06 DIAGNOSIS — J811 Chronic pulmonary edema: Secondary | ICD-10-CM | POA: Diagnosis present

## 2011-08-06 DIAGNOSIS — Z6841 Body Mass Index (BMI) 40.0 and over, adult: Secondary | ICD-10-CM

## 2011-08-06 DIAGNOSIS — S78119A Complete traumatic amputation at level between unspecified hip and knee, initial encounter: Secondary | ICD-10-CM

## 2011-08-06 DIAGNOSIS — Z89611 Acquired absence of right leg above knee: Secondary | ICD-10-CM

## 2011-08-06 DIAGNOSIS — Z951 Presence of aortocoronary bypass graft: Secondary | ICD-10-CM

## 2011-08-06 DIAGNOSIS — D649 Anemia, unspecified: Secondary | ICD-10-CM | POA: Diagnosis not present

## 2011-08-06 DIAGNOSIS — N179 Acute kidney failure, unspecified: Secondary | ICD-10-CM | POA: Diagnosis present

## 2011-08-06 DIAGNOSIS — Z89612 Acquired absence of left leg above knee: Secondary | ICD-10-CM

## 2011-08-06 DIAGNOSIS — M6282 Rhabdomyolysis: Secondary | ICD-10-CM | POA: Diagnosis present

## 2011-08-06 DIAGNOSIS — J9600 Acute respiratory failure, unspecified whether with hypoxia or hypercapnia: Secondary | ICD-10-CM

## 2011-08-06 DIAGNOSIS — I4892 Unspecified atrial flutter: Secondary | ICD-10-CM | POA: Diagnosis not present

## 2011-08-06 DIAGNOSIS — R6510 Systemic inflammatory response syndrome (SIRS) of non-infectious origin without acute organ dysfunction: Secondary | ICD-10-CM | POA: Diagnosis present

## 2011-08-06 DIAGNOSIS — R0902 Hypoxemia: Secondary | ICD-10-CM | POA: Diagnosis present

## 2011-08-06 DIAGNOSIS — N170 Acute kidney failure with tubular necrosis: Secondary | ICD-10-CM | POA: Diagnosis present

## 2011-08-06 DIAGNOSIS — E8729 Other acidosis: Secondary | ICD-10-CM

## 2011-08-06 DIAGNOSIS — R4182 Altered mental status, unspecified: Secondary | ICD-10-CM | POA: Diagnosis present

## 2011-08-06 DIAGNOSIS — I2510 Atherosclerotic heart disease of native coronary artery without angina pectoris: Secondary | ICD-10-CM | POA: Diagnosis present

## 2011-08-06 HISTORY — DX: Depression, unspecified: F32.A

## 2011-08-06 HISTORY — DX: Peripheral vascular disease, unspecified: I73.9

## 2011-08-06 HISTORY — DX: Gastro-esophageal reflux disease without esophagitis: K21.9

## 2011-08-06 HISTORY — DX: Polyneuropathy, unspecified: G62.9

## 2011-08-06 HISTORY — DX: Major depressive disorder, single episode, unspecified: F32.9

## 2011-08-06 HISTORY — DX: Essential (primary) hypertension: I10

## 2011-08-06 HISTORY — DX: Anxiety disorder, unspecified: F41.9

## 2011-08-06 LAB — BLOOD GAS, ARTERIAL
Acid-Base Excess: 4.8 mmol/L — ABNORMAL HIGH (ref 0.0–2.0)
Bicarbonate: 29.1 mEq/L — ABNORMAL HIGH (ref 20.0–24.0)
Drawn by: 129801
O2 Content: 4 L/min
O2 Saturation: 93.6 %
Patient temperature: 98.6
TCO2: 25.3 mmol/L (ref 0–100)
pCO2 arterial: 43.4 mmHg (ref 35.0–45.0)
pH, Arterial: 7.442 (ref 7.350–7.450)
pO2, Arterial: 67.3 mmHg — ABNORMAL LOW (ref 80.0–100.0)

## 2011-08-06 LAB — GLUCOSE, CAPILLARY: Glucose-Capillary: 161 mg/dL — ABNORMAL HIGH (ref 70–99)

## 2011-08-06 LAB — URINALYSIS, ROUTINE W REFLEX MICROSCOPIC
Bilirubin Urine: NEGATIVE
Glucose, UA: NEGATIVE mg/dL
Ketones, ur: NEGATIVE mg/dL
Leukocytes, UA: NEGATIVE
Nitrite: NEGATIVE
Protein, ur: 100 mg/dL — AB
Specific Gravity, Urine: 1.021 (ref 1.005–1.030)
Urobilinogen, UA: 1 mg/dL (ref 0.0–1.0)
pH: 6 (ref 5.0–8.0)

## 2011-08-06 LAB — COMPREHENSIVE METABOLIC PANEL
ALT: 13 U/L (ref 0–53)
AST: 19 U/L (ref 0–37)
Albumin: 3.8 g/dL (ref 3.5–5.2)
Alkaline Phosphatase: 65 U/L (ref 39–117)
BUN: 12 mg/dL (ref 6–23)
CO2: 25 mEq/L (ref 19–32)
Calcium: 9.5 mg/dL (ref 8.4–10.5)
Chloride: 91 mEq/L — ABNORMAL LOW (ref 96–112)
Creatinine, Ser: 0.95 mg/dL (ref 0.50–1.35)
GFR calc Af Amer: >90 mL/min (ref >90)
GFR calc non Af Amer: 80 mL/min — ABNORMAL LOW (ref >90)
Glucose, Bld: 285 mg/dL — ABNORMAL HIGH (ref 70–99)
Potassium: 3.4 mEq/L — ABNORMAL LOW (ref 3.5–5.1)
Sodium: 137 mEq/L (ref 135–145)
Total Bilirubin: 0.4 mg/dL (ref 0.3–1.2)
Total Protein: 8.1 g/dL (ref 6.0–8.3)

## 2011-08-06 LAB — PROTIME-INR
INR: 2.38 — ABNORMAL HIGH (ref 0.00–1.49)
Prothrombin Time: 26.4 seconds — ABNORMAL HIGH (ref 11.6–15.2)

## 2011-08-06 LAB — PROCALCITONIN: Procalcitonin: 32.64 ng/mL

## 2011-08-06 LAB — URINE MICROSCOPIC-ADD ON

## 2011-08-06 LAB — CBC WITH DIFFERENTIAL/PLATELET
Basophils Absolute: 0 10*3/uL (ref 0.0–0.1)
Basophils Relative: 0 % (ref 0–1)
Eosinophils Absolute: 0.1 10*3/uL (ref 0.0–0.7)
Eosinophils Relative: 1 % (ref 0–5)
HCT: 46.1 % (ref 39.0–52.0)
Hemoglobin: 15.4 g/dL (ref 13.0–17.0)
Lymphocytes Relative: 3 % — ABNORMAL LOW (ref 12–46)
Lymphs Abs: 0.7 10*3/uL (ref 0.7–4.0)
MCH: 32.4 pg (ref 26.0–34.0)
MCHC: 33.4 g/dL (ref 30.0–36.0)
MCV: 96.8 fL (ref 78.0–100.0)
Monocytes Absolute: 2.1 10*3/uL — ABNORMAL HIGH (ref 0.1–1.0)
Monocytes Relative: 9 % (ref 3–12)
Neutro Abs: 20.9 10*3/uL — ABNORMAL HIGH (ref 1.7–7.7)
Neutrophils Relative %: 87 % — ABNORMAL HIGH (ref 43–77)
Platelets: 396 10*3/uL (ref 150–400)
RBC: 4.76 MIL/uL (ref 4.22–5.81)
RDW: 15.1 % (ref 11.5–15.5)
WBC: 23.9 10*3/uL — ABNORMAL HIGH (ref 4.0–10.5)

## 2011-08-06 LAB — MRSA PCR SCREENING: MRSA by PCR: NEGATIVE

## 2011-08-06 LAB — APTT: aPTT: 30 seconds (ref 24–37)

## 2011-08-06 LAB — LACTIC ACID, PLASMA
Lactic Acid, Venous: 8.2 mmol/L — ABNORMAL HIGH (ref 0.5–2.2)
Lactic Acid, Venous: 3.7 mmol/L — ABNORMAL HIGH (ref 0.5–2.2)

## 2011-08-06 MED ORDER — PANTOPRAZOLE SODIUM 40 MG PO TBEC
40.0000 mg | DELAYED_RELEASE_TABLET | Freq: Every day | ORAL | Status: DC
  Administered 2011-08-07 – 2011-08-08 (×2): 40 mg via ORAL
  Filled 2011-08-06 (×3): qty 1

## 2011-08-06 MED ORDER — SODIUM CHLORIDE 0.9 % IJ SOLN
3.0000 mL | Freq: Two times a day (BID) | INTRAMUSCULAR | Status: DC
  Administered 2011-08-06 – 2011-08-09 (×5): 3 mL via INTRAVENOUS
  Administered 2011-08-10 (×2): via INTRAVENOUS
  Administered 2011-08-11 – 2011-08-14 (×5): 3 mL via INTRAVENOUS

## 2011-08-06 MED ORDER — PIPERACILLIN-TAZOBACTAM 3.375 G IVPB
3.3750 g | Freq: Once | INTRAVENOUS | Status: AC
  Administered 2011-08-06: 3.375 g via INTRAVENOUS
  Filled 2011-08-06: qty 50

## 2011-08-06 MED ORDER — SODIUM CHLORIDE 0.9 % IV SOLN
INTRAVENOUS | Status: DC
  Administered 2011-08-06 – 2011-08-08 (×7): via INTRAVENOUS

## 2011-08-06 MED ORDER — VANCOMYCIN HCL 10 G IV SOLR
1.0000 g | Freq: Once | INTRAVENOUS | Status: DC
  Filled 2011-08-06: qty 1000

## 2011-08-06 MED ORDER — ACETAMINOPHEN 650 MG RE SUPP: 650.0000 mg | Freq: Four times a day (QID) | RECTAL | Status: DC | PRN

## 2011-08-06 MED ORDER — IMIPRAMINE HCL 10 MG PO TABS
20.0000 mg | ORAL_TABLET | Freq: Every day | ORAL | Status: DC
  Administered 2011-08-06 – 2011-08-08 (×3): 20 mg via ORAL
  Filled 2011-08-06 (×5): qty 2

## 2011-08-06 MED ORDER — ONDANSETRON HCL 4 MG PO TABS: 4.0000 mg | ORAL_TABLET | Freq: Four times a day (QID) | ORAL | Status: DC | PRN

## 2011-08-06 MED ORDER — PIPERACILLIN-TAZOBACTAM 3.375 G IVPB
3.3750 g | Freq: Three times a day (TID) | INTRAVENOUS | Status: DC
  Administered 2011-08-06 – 2011-08-08 (×5): 3.375 g via INTRAVENOUS
  Filled 2011-08-06 (×6): qty 50

## 2011-08-06 MED ORDER — SODIUM CHLORIDE 0.9 % IV SOLN: INTRAVENOUS | Status: DC

## 2011-08-06 MED ORDER — FLUOXETINE HCL 20 MG PO TABS
10.0000 mg | ORAL_TABLET | Freq: Every day | ORAL | Status: DC
  Filled 2011-08-06: qty 1

## 2011-08-06 MED ORDER — VANCOMYCIN HCL IN DEXTROSE 1-5 GM/200ML-% IV SOLN
1000.0000 mg | Freq: Two times a day (BID) | INTRAVENOUS | Status: DC
  Administered 2011-08-07: 1000 mg via INTRAVENOUS
  Filled 2011-08-06 (×2): qty 200

## 2011-08-06 MED ORDER — SODIUM CHLORIDE 0.9 % IV BOLUS (SEPSIS)
1000.0000 mL | INTRAVENOUS | Status: AC
  Administered 2011-08-06: 1000 mL via INTRAVENOUS

## 2011-08-06 MED ORDER — HYDROMORPHONE HCL PF 1 MG/ML IJ SOLN
0.5000 mg | INTRAMUSCULAR | Status: DC | PRN
  Administered 2011-08-06 – 2011-08-08 (×3): 0.5 mg via INTRAVENOUS
  Filled 2011-08-06 (×3): qty 1

## 2011-08-06 MED ORDER — DIAZEPAM 5 MG PO TABS
10.0000 mg | ORAL_TABLET | Freq: Three times a day (TID) | ORAL | Status: DC | PRN
  Administered 2011-08-06 – 2011-08-08 (×3): 10 mg via ORAL
  Filled 2011-08-06: qty 1
  Filled 2011-08-06: qty 2
  Filled 2011-08-06: qty 1
  Filled 2011-08-06: qty 2

## 2011-08-06 MED ORDER — WARFARIN SODIUM 4 MG PO TABS
4.0000 mg | ORAL_TABLET | Freq: Every day | ORAL | Status: DC
  Administered 2011-08-06 – 2011-08-07 (×2): 4 mg via ORAL
  Filled 2011-08-06 (×3): qty 1

## 2011-08-06 MED ORDER — ACETAMINOPHEN 325 MG PO TABS
650.0000 mg | ORAL_TABLET | Freq: Four times a day (QID) | ORAL | Status: DC | PRN
  Administered 2011-08-10 – 2011-08-13 (×3): 650 mg via ORAL
  Filled 2011-08-06 (×4): qty 2

## 2011-08-06 MED ORDER — ACETAMINOPHEN 325 MG PO TABS
650.0000 mg | ORAL_TABLET | Freq: Once | ORAL | Status: AC
  Administered 2011-08-06: 650 mg via ORAL
  Filled 2011-08-06: qty 2

## 2011-08-06 MED ORDER — WARFARIN - PHARMACIST DOSING INPATIENT
Freq: Every day | Status: DC
  Administered 2011-08-07: 17:00:00

## 2011-08-06 MED ORDER — INSULIN ASPART 100 UNIT/ML ~~LOC~~ SOLN
0.0000 [IU] | Freq: Three times a day (TID) | SUBCUTANEOUS | Status: DC
  Administered 2011-08-06 – 2011-08-07 (×2): 2 [IU] via SUBCUTANEOUS
  Administered 2011-08-07: 1 [IU] via SUBCUTANEOUS
  Administered 2011-08-07: 2 [IU] via SUBCUTANEOUS
  Administered 2011-08-08 (×2): 1 [IU] via SUBCUTANEOUS
  Administered 2011-08-08: 2 [IU] via SUBCUTANEOUS
  Administered 2011-08-09 (×2): 1 [IU] via SUBCUTANEOUS

## 2011-08-06 MED ORDER — VANCOMYCIN HCL IN DEXTROSE 1-5 GM/200ML-% IV SOLN
1000.0000 mg | Freq: Once | INTRAVENOUS | Status: AC
  Administered 2011-08-06: 1000 mg via INTRAVENOUS
  Filled 2011-08-06: qty 200

## 2011-08-06 MED ORDER — FLUOXETINE HCL 10 MG PO CAPS
10.0000 mg | ORAL_CAPSULE | Freq: Every day | ORAL | Status: DC
  Administered 2011-08-06 – 2011-08-09 (×4): 10 mg via ORAL
  Filled 2011-08-06 (×4): qty 1

## 2011-08-06 MED ORDER — CLONAZEPAM 0.5 MG PO TABS: 0.5000 mg | ORAL_TABLET | Freq: Every evening | ORAL | Status: DC | PRN

## 2011-08-06 MED ORDER — SIMVASTATIN 40 MG PO TABS
40.0000 mg | ORAL_TABLET | Freq: Every day | ORAL | Status: DC
  Administered 2011-08-06 – 2011-08-07 (×2): 40 mg via ORAL
  Filled 2011-08-06 (×3): qty 1

## 2011-08-06 MED ORDER — GABAPENTIN 300 MG PO CAPS
900.0000 mg | ORAL_CAPSULE | Freq: Three times a day (TID) | ORAL | Status: DC
  Administered 2011-08-06 – 2011-08-08 (×8): 900 mg via ORAL
  Filled 2011-08-06 (×12): qty 3

## 2011-08-06 MED ORDER — ONDANSETRON HCL 4 MG/2ML IJ SOLN: 4.0000 mg | Freq: Four times a day (QID) | INTRAMUSCULAR | Status: DC | PRN

## 2011-08-06 MED ORDER — SODIUM CHLORIDE 0.9 % IV SOLN: Freq: Once | INTRAVENOUS | Status: DC

## 2011-08-06 NOTE — ED Provider Notes (Signed)
History     CSN: [id number]  Arrival date & time 08/06/11  4098   First MD Initiated Contact with Patient 08/06/11 [phone]      Chief Complaint  Patient presents with  . Altered Mental Status    (Consider location/radiation/quality/duration/timing/severity/associated sxs/prior treatment) HPI Comments: Pt presents via EMS for further evaluation of altered mental status.  His spouse found him slumped in his powered chair, confused and slumped over.  He was sweating profusely but able to respond and answer questions.  He reported that he had started his usual morning routine that includes starting the coffee pot, going outside for the paper, and shaving however his wife states he had done none of those things this morning with the exception of possibly shaving.    He states that he feels fine with the exception of feeling extremely hot.  Patient is a 76 y.o. male presenting with altered mental status. The history is provided by the patient and the spouse (and his son). The history is limited by the condition of the patient (pt is altered and appears to have an inaccurate recollection of events).  Altered Mental Status This is a new problem. Episode onset: noted am today over last 2 hours. The problem occurs constantly. The problem has not changed since onset.Pertinent negatives include no chest pain, no abdominal pain, no headaches and no shortness of breath. Associated symptoms comments: Diaphoresis and confusion.. Nothing aggravates the symptoms. Nothing relieves the symptoms. He has tried nothing for the symptoms.    Past Medical History  Diagnosis Date  . Diabetes mellitus   . Hypertension     Past Surgical History  Procedure Date  . Leg amputation through knee     No family history on file.  History  Substance Use Topics  . Smoking status: Never Smoker   . Smokeless tobacco: Not on file  . Alcohol Use: No      Review of Systems  Constitutional: Positive for fever,  diaphoresis and fatigue. Negative for chills, activity change and appetite change.  HENT: Negative for nosebleeds, congestion, facial swelling, rhinorrhea, neck pain and neck stiffness.   Eyes: Negative.   Respiratory: Negative for shortness of breath.   Cardiovascular: Negative for chest pain, palpitations and leg swelling.  Gastrointestinal: Negative for nausea, vomiting, abdominal pain, diarrhea and constipation.  Genitourinary: Negative for urgency, frequency, hematuria, flank pain, decreased urine volume and difficulty urinating.  Musculoskeletal: Negative for back pain, joint swelling and arthralgias.       C/o constant leg/stump pain (hx bilat AKAs)  Neurological: Positive for weakness and light-headedness. Negative for dizziness, tremors, seizures, syncope and headaches.  Hematological: Negative for adenopathy. Does not bruise/bleed easily.  Psychiatric/Behavioral: Positive for confusion, decreased concentration and altered mental status. Negative for hallucinations, behavioral problems, dysphoric mood and agitation.    Allergies  Review of patient's allergies indicates not on file.  Home Medications  No current outpatient prescriptions on file.  BP 102/65  Pulse 133  Temp 100.6 F (38.1 C) (Oral)  Resp 20  SpO2 91%  Physical Exam  Nursing note and vitals reviewed. Constitutional: He appears well-developed and well-nourished. No distress.  HENT:  Head: Normocephalic and atraumatic.  Right Ear: External ear normal.  Left Ear: External ear normal.  Nose: Nose normal.  Mouth/Throat: Oropharynx is clear and moist. No oropharyngeal exudate.  Eyes: Conjunctivae and EOM are normal. Pupils are equal, round, and reactive to light. Right eye exhibits no discharge. Left eye exhibits no discharge. No scleral icterus.  Neck: Normal range of motion. Neck supple. No JVD present. No tracheal deviation present. No thyromegaly present.  Cardiovascular: Regular rhythm, S1 normal, S2  normal, normal heart sounds and intact distal pulses.  Tachycardia present.  PMI is not displaced.  Exam reveals no gallop, no distant heart sounds and no friction rub.   No murmur heard. Pulmonary/Chest: Effort normal. No stridor. No respiratory distress. He has no decreased breath sounds. He has wheezes. He has rhonchi. He has no rales.  Abdominal: Soft. Bowel sounds are normal. He exhibits distension. He exhibits no shifting dullness, no pulsatile liver, no fluid wave, no ascites and no mass. There is no tenderness. There is no rebound, no guarding and no CVA tenderness.       Protuberant abdomen, note prev surgical scars and hernias umbilical and LL  Musculoskeletal: Normal range of motion. He exhibits no edema and no tenderness.       bilat AKA  Lymphadenopathy:    He has no cervical adenopathy.  Neurological: He is alert. He has normal strength. No cranial nerve deficit or sensory deficit. Coordination normal. GCS eye subscore is 4. GCS verbal subscore is 4. GCS motor subscore is 6.  Skin: Skin is warm. No abrasion, no ecchymosis and no rash noted. He is diaphoretic. No erythema. No pallor.  Psychiatric: He has a normal mood and affect. His behavior is normal. Judgment and thought content normal. His speech is not rapid and/or pressured, not delayed, not tangential and not slurred. Cognition and memory are not impaired. He is communicative. He exhibits abnormal recent memory. He exhibits normal remote memory.       Note just mild confusion.    ED Course  Procedures (including critical care time)   Labs Reviewed  CBC WITH DIFFERENTIAL  COMPREHENSIVE METABOLIC PANEL  PROTIME-INR  APTT  CULTURE, BLOOD (ROUTINE X 2)  CULTURE, BLOOD (ROUTINE X 2)  URINALYSIS, WITH MICROSCOPIC  URINE CULTURE  BLOOD GAS, ARTERIAL  LACTIC ACID, PLASMA   No results found.   No diagnosis found.   Date: 08/06/2011  Rate:133bpm    Rhythm: atrial fibrillation, tachtcardia  QRS Axis: normal   Intervals:   ST/T Wave abnormalities: nonspecific ST changes  Conduction Disutrbances:none  Narrative Interpretation: irregular tachycardia appears to be atrial afib/flutter.  Diffuse nonspec ST changes.  Old EKG Reviewed: none available, unchanged and changes noted     MDM  Pt presents for evaluation of altered mental status.  He is currently febrile with noted tachycardia and low O2 sat.  Will initiate a sepsis protocol including basic labs, CXR, U/A, blood & urine cultures, IVF, tylenol, and broad abx.  Will review results as available and adjust antibiotic coverage accordingly.  Anticipate admission.  Pt's HR has improved after IVF bolus.  He still has a depressed O2 sat but appears comfortable.  Note significant leukocytosis and lactic acidemia (no abdominal tenderness observed on repeat exam).  CXR does not demonstrate an infiltrate.  Pt has had no urine output as of yet.  Plan place foley catheter, will continue IVF and abx, consult has been placed to hospitalist.  Pt's HR, O2 sat, and BP are improved.  He will be admitted to the hospitalist service to a step-down bed.  CRITICAL CARE Performed by: Dana Allan T   Total critical care time: 45+  Critical care time was exclusive of separately billable procedures and treating other patients.  Critical care was necessary to treat or prevent imminent or life-threatening deterioration.  Critical care was time  spent personally by me on the following activities: development of treatment plan with patient and/or surrogate as well as nursing, discussions with consultants, evaluation of patient's response to treatment, examination of patient, obtaining history from patient or surrogate, ordering and performing treatments and interventions, ordering and review of laboratory studies, ordering and review of radiographic studies, pulse oximetry and re-evaluation of patient's condition.       Tobin Chad, MD 08/06/11 1330

## 2011-08-06 NOTE — Progress Notes (Signed)
Mark Russell, is a 76 y.o. male,   MRN: [id number]  -  DOB - [birth year]  Outpatient Primary MD for the patient is Astrid Divine, MD  in for    Chief Complaint  Patient presents with  . Altered Mental Status     Blood pressure 102/65, pulse 133, temperature 100.6 F (38.1 C), temperature source Oral, resp. rate 20, SpO2 91.00%.  Principal Problem:  *SIRS (systemic inflammatory response syndrome) Active Problems:  Hypoxia  Leukocytosis  Acidosis    76 yo hx DM, HTN bilateral LE amputee lives at home with wife. Was in normal state of health until this am when he awakened altered according to wife. Slumped in chair,lethargic,  confused on activities of the morning.  No recent cough, fever, diarrhea nausea vomiting. Wife does report recent dark urine with foul odor. In ED HR 130, BP102/65, sats 80's room air, WC 23.9, chloride 91, gap 21, lactic acid 8.2 chest xray yields  stable exam. Mild chronic bilateral peribronchial thickening. This can be seen in the setting of acute or chronic bronchitis, smoking, or asthma.  No definite airspace disease is identified. Given IV fluids and HR improved to 100 BP 120/60 respers 20 sats 94 on 3L.   Admit to [facility].

## 2011-08-06 NOTE — Progress Notes (Signed)
[facility] ED CM went to see pt about CM consult from Admission RN but pt in CT presently per ED RN

## 2011-08-06 NOTE — ED Notes (Signed)
Per ems; family called for ams. Family is primary care givers. Pt is hot to touch pt is diaphoretic. Pt does c/o groin pain. Pt is also c/o diarrhea. Pt was alert and oriented for ems. Pt's son states pt was unable to answer question this morning. Pt's cbg 320

## 2011-08-06 NOTE — ED Notes (Signed)
ZOX:[id number]<AV> Expected date:08/06/11<BR> Expected time: 9:00 AM<BR> Means of arrival:Ambulance<BR> Comments:<BR> 76yoM, AMS

## 2011-08-06 NOTE — ED Notes (Signed)
Patient transported to X-ray 

## 2011-08-06 NOTE — ED Notes (Signed)
Unable to get 2nd set of blood cultures. rn attempted in the lac x2. edmd notified abx started with out 2nd set of cultures

## 2011-08-06 NOTE — Progress Notes (Signed)
ANTICOAGULATION/ANTIBIOTIC CONSULT NOTE - Initial Consult  Pharmacy Consult for Vancomycin/Zosyn and Coumadin Indication: Sepsis and VTE treatment  Allergies  Allergen Reactions  . Morphine And Related Other (See Comments)    "mean"    Patient Measurements:     Vital Signs: Temp: 99 F (37.2 C) (08/01 1530) Temp src: Oral (08/01 0926) BP: 135/81 mmHg (08/01 1300) Pulse Rate: 91  (08/01 1530)  Labs:  Basename 08/06/11 1000  HGB 15.4  HCT 46.1  PLT 396  APTT 30  LABPROT 26.4*  INR 2.38*  HEPARINUNFRC --  CREATININE 0.95  CKTOTAL --  CKMB --  TROPONINI --    CrCl is unknown because there is no height on file for the current visit.   Medical History: Past Medical History  Diagnosis Date  . Diabetes mellitus   . Hypertension   . GERD (gastroesophageal reflux disease)   . Anxiety   . Depression   . Peripheral vascular disease   . Neuropathy     Medications:  Zosyn 3.375g IV x 1 in ER 8/1 @ 1024 Vancomycin 1g IV x 1 in ER 8/1 @ 1058  Assessment: 79 yoM presents to ED with AMS, sepsis.  Pharmacy asked to manage empiric Vancomycin and Zosyn therapy.      Tm 100.6, WBC 23.9  SCr 0.95 - estimated normalized CrCl ~69 ml/min (regular CrCl equation may underestimate clearance due to height calculated from amputation).    Urine, blood cultures pending  Pt also has history of DVTs and is on coumadin as outpatient.  Home dose: 4mg  po daily (confirmed with wife)  INR currently 2.38 - therapeutic.    Hgb, plts WNL  No bleeding noted  Goal of Therapy:  Vancomycin 15-20 mcg/ml INR 2-3 Monitor platelets by anticoagulation protocol: Yes   Plan:  1.  Zosyn 3.375g IV q 8 hours (4 hour infusion) 2.  Vancomycin 1 gram q 12 hours 3.  Restart Coumadin home dose 4mg  po daily 4.  Daily PT/INR 5.  F/u renal function, CBC, cultures, clinical course  Teffany Blaszczyk E 08/06/2011,4:02 PM

## 2011-08-06 NOTE — H&P (Signed)
PCP:   Astrid Divine, MD   Chief Complaint:  Weakness,lethargy/fever  HPI: [name] is a 76 year old white male with history of diabetes, peripheral vascular disease, history of DVTs on Coumadin, bilateral above-knee amputation, wheelchair-bound at baseline was found slumped over in his wheelchair by his wife this morning. He was found to be febrile , and diaphoretic at home and subsequently brought to to the ER by EMS . Patient currently feels much better after receiving IV Zosyn in the ER. He complains of a slight cough, occasionally productive of white sputum with wheezing off and on for 2 weeks, had a chest x-ray done about 10 days ago by his PCP which was reportedly normal.has been taking some Alka-Seltzer at home without much benefit. She denies any chest pain shortness of breath Denies abdominal pain nausea vomiting, diarrhea or dysuria. Wife reports urinary frequency and foul-smelling urine, denies any headache, photophobia, rashes or skin breakdown. Upon evaluation in emergency room and noted to have a white count of 23,000 with a left shift and lactic acid of 8.2   Allergies:  Past Surgical History  Procedure Date  . Leg amputation through knee 06/2004  . Abdominal aortic aneurysm repair 1988  . Back surgery 1988, 1989    Prior to Admission medications   Medication Sig Start Date End Date Taking? Authorizing Provider  clonazePAM (KLONOPIN) 0.5 MG tablet Take 0.5 mg by mouth at bedtime as needed.   Yes Historical Provider, MD  diazepam (VALIUM) 10 MG tablet Take 10 mg by mouth 3 (three) times daily as needed.   Yes Historical Provider, MD  FLUoxetine (PROZAC) 10 MG tablet Take 10 mg by mouth daily.   Yes Historical Provider, MD  gabapentin (NEURONTIN) 300 MG capsule Take 900 mg by mouth 3 (three) times daily.   Yes Historical Provider, MD  glimepiride (AMARYL) 2 MG tablet Take 2 mg by mouth daily before breakfast.   Yes Historical Provider, MD  imipramine (TOFRANIL)  10 MG tablet Take 20 mg by mouth at bedtime.   Yes Historical Provider, MD  losartan-hydrochlorothiazide (HYZAAR) 100-25 MG per tablet Take 1 tablet by mouth daily.   Yes Historical Provider, MD  omeprazole (PRILOSEC) 20 MG capsule Take 40 mg by mouth 2 (two) times daily.   Yes Historical Provider, MD  simvastatin (ZOCOR) 40 MG tablet Take 40 mg by mouth every evening.   Yes Historical Provider, MD    Social History: he is married lives at home with his wife, wheelchair-bound, quit smoking in 89, 35-pack-year tobacco use, denies alcohol use   reports that he quit smoking about 16 years ago. His smoking use included Cigarettes. He has never used smokeless tobacco. He reports that he does not drink alcohol. His drug history not on file.  History reviewed. No pertinent family history.  Review of Systems:  Constitutional: Denies fever, chills, diaphoresis, appetite change and fatigue.  HEENT: Denies photophobia, eye pain, redness, hearing loss, ear pain, congestion, sore throat, rhinorrhea, sneezing, mouth sores, trouble swallowing, neck pain, neck stiffness and tinnitus.   Respiratory: Denies SOB, DOE, cough, chest tightness,  and wheezing.   Cardiovascular: Denies chest pain, palpitations and leg swelling.  Gastrointestinal: Denies nausea, vomiting, abdominal pain, diarrhea, constipation, blood in stool and abdominal distention.  Genitourinary: Denies dysuria, urgency, frequency, hematuria, flank pain and difficulty urinating.  Musculoskeletal: Denies myalgias, back pain, joint swelling, arthralgias and gait problem.  Skin: Denies pallor, rash and wound.  Neurological: Denies dizziness, seizures, syncope, weakness, light-headedness, numbness and headaches.  Hematological: Denies adenopathy. Easy bruising, personal or family bleeding history  Psychiatric/Behavioral: Denies suicidal ideation, mood changes, confusion, nervousness, sleep disturbance and agitation   Physical Exam: Blood pressure  135/81, pulse 94, temperature 99.7 F (37.6 C), temperature source Oral, resp. rate 18, SpO2 98.00%. Alert awake oriented x3 in no acute distress, slight flushing of the face  HEENT : Pupils equal reactive to light, oral mucosa moist and pink CVS: S1S2/RRR, no m/r/g Lungs: CTAB Abd: soft, obese, a large vertical surgical scar, multiple small ventral abdominal hernia is without evidence of strangulation No flank tenderness Ext: bilateral AKA, no evidence of skin breakdown   For and he HEENT mission:  Results for orders placed during the hospital encounter of 08/06/11 (from the past 48 hour(s))  CBC WITH DIFFERENTIAL     Status: Abnormal   Collection Time   08/06/11 10:00 AM      Component Value Range Comment   WBC 23.9 (*) 4.0 - 10.5 K/uL    RBC 4.76  4.22 - 5.81 MIL/uL    Hemoglobin 15.4  13.0 - 17.0 g/dL    HCT 47.8  29.5 - 62.1 %    MCV 96.8  78.0 - 100.0 fL    MCH 32.4  26.0 - 34.0 pg    MCHC 33.4  30.0 - 36.0 g/dL    RDW 30.8  65.7 - 84.6 %    Platelets 396  150 - 400 K/uL    Neutrophils Relative 87 (*) 43 - 77 %    Neutro Abs 20.9 (*) 1.7 - 7.7 K/uL    Lymphocytes Relative 3 (*) 12 - 46 %    Lymphs Abs 0.7  0.7 - 4.0 K/uL    Monocytes Relative 9  3 - 12 %    Monocytes Absolute 2.1 (*) 0.1 - 1.0 K/uL    Eosinophils Relative 1  0 - 5 %    Eosinophils Absolute 0.1  0.0 - 0.7 K/uL    Basophils Relative 0  0 - 1 %    Basophils Absolute 0.0  0.0 - 0.1 K/uL   COMPREHENSIVE METABOLIC PANEL     Status: Abnormal   Collection Time   08/06/11 10:00 AM      Component Value Range Comment   Sodium 137  135 - 145 mEq/L    Potassium 3.4 (*) 3.5 - 5.1 mEq/L    Chloride 91 (*) 96 - 112 mEq/L    CO2 25  19 - 32 mEq/L    Glucose, Bld 285 (*) 70 - 99 mg/dL    BUN 12  6 - 23 mg/dL    Creatinine, Ser 9.62  0.50 - 1.35 mg/dL    Calcium 9.5  8.4 - 95.2 mg/dL    Total Protein 8.1  6.0 - 8.3 g/dL    Albumin 3.8  3.5 - 5.2 g/dL    AST 19  0 - 37 U/L    ALT 13  0 - 53 U/L    Alkaline  Phosphatase 65  39 - 117 U/L    Total Bilirubin 0.4  0.3 - 1.2 mg/dL    GFR calc non Af Amer 80 (*) >90 mL/min    GFR calc Af Amer >90  >90 mL/min   PROTIME-INR     Status: Abnormal   Collection Time   08/06/11 10:00 AM      Component Value Range Comment   Prothrombin Time 26.4 (*) 11.6 - 15.2 seconds    INR 2.38 (*) 0.00 - 1.49  APTT     Status: Normal   Collection Time   08/06/11 10:00 AM      Component Value Range Comment   aPTT 30  24 - 37 seconds   LACTIC ACID, PLASMA     Status: Abnormal   Collection Time   08/06/11 10:00 AM      Component Value Range Comment   Lactic Acid, Venous 8.2 (*) 0.5 - 2.2 mmol/L   BLOOD GAS, ARTERIAL     Status: Abnormal   Collection Time   08/06/11 10:30 AM      Component Value Range Comment   O2 Content 4.0      Delivery systems NASAL CANNULA      pH, Arterial 7.442  7.350 - 7.450    pCO2 arterial 43.4  35.0 - 45.0 mmHg    pO2, Arterial 67.3 (*) 80.0 - 100.0 mmHg    Bicarbonate 29.1 (*) 20.0 - 24.0 mEq/L    TCO2 25.3  0 - 100 mmol/L    Acid-Base Excess 4.8 (*) 0.0 - 2.0 mmol/L    O2 Saturation 93.6      Patient temperature 98.6      Collection site BRACHIAL ARTERY      Drawn by [id number]      Sample type ARTERIAL     URINALYSIS, ROUTINE W REFLEX MICROSCOPIC     Status: Abnormal   Collection Time   08/06/11 11:57 AM      Component Value Range Comment   Color, Urine YELLOW  YELLOW    APPearance CLOUDY (*) CLEAR    Specific Gravity, Urine 1.021  1.005 - 1.030    pH 6.0  5.0 - 8.0    Glucose, UA NEGATIVE  NEGATIVE mg/dL    Hgb urine dipstick LARGE (*) NEGATIVE    Bilirubin Urine NEGATIVE  NEGATIVE    Ketones, ur NEGATIVE  NEGATIVE mg/dL    Protein, ur 696 (*) NEGATIVE mg/dL    Urobilinogen, UA 1.0  0.0 - 1.0 mg/dL    Nitrite NEGATIVE  NEGATIVE    Leukocytes, UA NEGATIVE  NEGATIVE   URINE MICROSCOPIC-ADD ON     Status: Abnormal   Collection Time   08/06/11 11:57 AM      Component Value Range Comment   Squamous Epithelial / LPF RARE  RARE     WBC, UA 0-2  <3 WBC/hpf    RBC / HPF 3-6  <3 RBC/hpf    Casts HYALINE CASTS (*) NEGATIVE GRANULAR CAST   Urine-Other MUCOUS PRESENT   SPERM PRESENT    Radiological Exams on Admission: Dg Chest 2 View  08/06/2011  *RADIOLOGY REPORT*  Clinical Data: Shortness of breath.  History of diabetes.  CHEST - 2 VIEW  Comparison: Chest radiograph 07/22/2011 and 06/25/2008  Findings: Lung volumes are slightly low.  Heart size is borderline enlarged and stable.  Surgical clips noted over the right axillary region/ versus anterior right chest wall.  Bilateral peribronchial thickening appears unchanged compared to prior chest radiographs.No focal airspace disease, effusion, or pulmonary edema is identified.  Negative for pneumothorax.  No acute bony abnormalities identified.  Surgical clips project over the upper abdomen, on the lateral view.  IMPRESSION:  1.  Stable exam.  Mild chronic bilateral peribronchial thickening. This can be seen in the setting of acute or chronic bronchitis, smoking, or asthma. 2.  No definite airspace disease is identified.  Original Report Authenticated By: Britta Mccreedy, M.D.    Assessment/Plan 1. Sepsis Suspect urine may be a source,  symptoms for few days, UA questionable  Empiric IV Vancomycin and Zosyn FU Blood and urine cultures IVF Repeat Lactic acid now and check pro calcitonin level as well  2. Toxic metabolic encephalopathy Secondary to sepsis Improved already No meningeal signs  3. Lactic acidosis: secondary to sepsis, repeat stat to make sure this is trending down, abdominal exam benign not suggestive of bowel pathology  3. H/o DVT: continue coumadin  4. PVD  S/P Bilateral AKA  5. DM: hold amaryl, SSI  Code status: DNR   Time Spent on Admission:  Milledge Gerding [facility] Pager: [phone] 08/06/2011, 2:43 PM

## 2011-08-06 NOTE — ED Notes (Signed)
Attempted to call and give receiving nurse report. Nurse was unable to answer phone will re try

## 2011-08-06 NOTE — ED Notes (Signed)
Pt is refusing to be cath at this time for urine. Pt is explained ua is needed.

## 2011-08-07 ENCOUNTER — Inpatient Hospital Stay (HOSPITAL_COMMUNITY): Payer: Medicare Other

## 2011-08-07 DIAGNOSIS — N179 Acute kidney failure, unspecified: Secondary | ICD-10-CM | POA: Diagnosis present

## 2011-08-07 DIAGNOSIS — I82409 Acute embolism and thrombosis of unspecified deep veins of unspecified lower extremity: Secondary | ICD-10-CM

## 2011-08-07 LAB — BASIC METABOLIC PANEL
BUN: 23 mg/dL (ref 6–23)
CO2: 30 mEq/L (ref 19–32)
Calcium: 8.6 mg/dL (ref 8.4–10.5)
Chloride: 95 mEq/L — ABNORMAL LOW (ref 96–112)
Creatinine, Ser: 2.68 mg/dL — ABNORMAL HIGH (ref 0.50–1.35)
GFR calc Af Amer: 25 mL/min — ABNORMAL LOW (ref >90)
GFR calc non Af Amer: 22 mL/min — ABNORMAL LOW (ref >90)
Glucose, Bld: 149 mg/dL — ABNORMAL HIGH (ref 70–99)
Potassium: 3.2 mEq/L — ABNORMAL LOW (ref 3.5–5.1)
Sodium: 138 mEq/L (ref 135–145)

## 2011-08-07 LAB — GLUCOSE, CAPILLARY
Glucose-Capillary: 125 mg/dL — ABNORMAL HIGH (ref 70–99)
Glucose-Capillary: 150 mg/dL — ABNORMAL HIGH (ref 70–99)
Glucose-Capillary: 166 mg/dL — ABNORMAL HIGH (ref 70–99)
Glucose-Capillary: 168 mg/dL — ABNORMAL HIGH (ref 70–99)
Glucose-Capillary: 142 mg/dL — ABNORMAL HIGH (ref 70–99)

## 2011-08-07 LAB — PROTIME-INR
INR: 2.23 — ABNORMAL HIGH (ref 0.00–1.49)
Prothrombin Time: 25.1 seconds — ABNORMAL HIGH (ref 11.6–15.2)

## 2011-08-07 LAB — COMPREHENSIVE METABOLIC PANEL
ALT: 38 U/L (ref 0–53)
AST: 378 U/L — ABNORMAL HIGH (ref 0–37)
Albumin: 3.1 g/dL — ABNORMAL LOW (ref 3.5–5.2)
Alkaline Phosphatase: 51 U/L (ref 39–117)
BUN: 22 mg/dL (ref 6–23)
CO2: 33 mEq/L — ABNORMAL HIGH (ref 19–32)
Calcium: 8.7 mg/dL (ref 8.4–10.5)
Chloride: 92 mEq/L — ABNORMAL LOW (ref 96–112)
Creatinine, Ser: 2.58 mg/dL — ABNORMAL HIGH (ref 0.50–1.35)
GFR calc Af Amer: 27 mL/min — ABNORMAL LOW (ref >90)
GFR calc non Af Amer: 23 mL/min — ABNORMAL LOW (ref >90)
Glucose, Bld: 148 mg/dL — ABNORMAL HIGH (ref 70–99)
Potassium: 3.2 mEq/L — ABNORMAL LOW (ref 3.5–5.1)
Sodium: 135 mEq/L (ref 135–145)
Total Bilirubin: 0.4 mg/dL (ref 0.3–1.2)
Total Protein: 7.1 g/dL (ref 6.0–8.3)

## 2011-08-07 LAB — CBC
HCT: 42.6 % (ref 39.0–52.0)
Hemoglobin: 14 g/dL (ref 13.0–17.0)
MCH: 31.7 pg (ref 26.0–34.0)
MCHC: 32.9 g/dL (ref 30.0–36.0)
MCV: 96.6 fL (ref 78.0–100.0)
Platelets: 311 10*3/uL (ref 150–400)
RBC: 4.41 MIL/uL (ref 4.22–5.81)
RDW: 15.2 % (ref 11.5–15.5)
WBC: 19.7 10*3/uL — ABNORMAL HIGH (ref 4.0–10.5)

## 2011-08-07 LAB — VANCOMYCIN, RANDOM: Vancomycin Rm: 10.5 ug/mL

## 2011-08-07 LAB — URINE CULTURE
Colony Count: NO GROWTH
Culture: NO GROWTH

## 2011-08-07 LAB — LACTIC ACID, PLASMA: Lactic Acid, Venous: 1.2 mmol/L (ref 0.5–2.2)

## 2011-08-07 MED ORDER — VANCOMYCIN HCL 1000 MG IV SOLR
1500.0000 mg | INTRAVENOUS | Status: DC
  Administered 2011-08-07: 1500 mg via INTRAVENOUS
  Filled 2011-08-07 (×2): qty 1500

## 2011-08-07 NOTE — Progress Notes (Addendum)
Assessment/Plan  1. Sepsis -improved Suspect urine may be a source, symptoms for few days, UA questionable  Continue empiric IV Vancomycin and Zosyn  FU Blood and urine cultures  IVF  Repeat Lactic acid normalized   2. Toxic metabolic encephalopathy  Secondary to sepsis  resolved No meningeal signs   3. ARF: ?ATN secondary to sepsis,  will check renal USG, continue IVF, I/Os,    Renal consult STAT Vancomycin level, hold next dose of VAnc Was on ARB and diuretic prior to admission which was held since yesterday  4. Lactic acidosis: secondary to sepsis, repeat normalized, abdominal exam benign not suggestive of bowel pathology   5. H/o DVT: continue coumadin per pharmacy  6. PVD S/P Bilateral AKA   7. DM: hold amaryl, SSI   Code status: DNR    Harding Thomure  [facility]  Pager: [phone]  08/07/2011,     Subjective: Feels good, slept ok, occasional cough, had a normal BM   Objective: Vital signs in last 24 hours: Temp:  [97.9 F (36.6 C)-100.6 F (38.1 C)] 97.9 F (36.6 C) (08/02 0240) Pulse Rate:  [77-133] 77  (08/02 0240) Resp:  [14-21] 14  (08/02 0240) BP: (102-151)/(47-81) 133/51 mmHg (08/02 0240) SpO2:  [91 %-98 %] 94 % (08/02 0240) Weight:  [115.9 kg (255 lb 8.2 oz)-118.6 kg (261 lb 7.5 oz)] 118.6 kg (261 lb 7.5 oz) (08/02 0300) Weight change:  Last BM Date:  (PTA)  Intake/Output from previous day: 08/01 0701 - 08/02 0700 In: 2363 [P.O.:960; I.V.:1303; IV Piggyback:100] Out: 500 [Urine:500]     Physical Exam: Alert awake oriented x3 in no acute distress, slight flushing of the face  HEENT : Pupils equal reactive to light, oral mucosa moist and pink  CVS: S1S2/RRR, no m/r/g  Lungs: CTAB  Abd: soft, obese, a large vertical surgical scar, multiple small ventral abdominal hernia is without evidence of strangulation  No flank tenderness  Ext: bilateral AKA, no evidence of skin breakdown    Lab Results: Basic Metabolic Panel:  Basename  08/07/11 0608 08/06/11 1000  NA 135 137  K 3.2* 3.4*  CL 92* 91*  CO2 33* 25  GLUCOSE 148* 285*  BUN 22 12  CREATININE 2.58* 0.95  CALCIUM 8.7 9.5  MG -- --  PHOS -- --   Liver Function Tests:  Basename 08/07/11 0608 08/06/11 1000  AST 378* 19  ALT 38 13  ALKPHOS 51 65  BILITOT 0.4 0.4  PROT 7.1 8.1  ALBUMIN 3.1* 3.8   No results found for this basename: LIPASE:2,AMYLASE:2 in the last 72 hours No results found for this basename: AMMONIA:2 in the last 72 hours CBC:  Basename 08/07/11 0608 08/06/11 1000  WBC 19.7* 23.9*  NEUTROABS -- 20.9*  HGB 14.0 15.4  HCT 42.6 46.1  MCV 96.6 96.8  PLT 311 396   Cardiac Enzymes: No results found for this basename: CKTOTAL:3,CKMB:3,CKMBINDEX:3,TROPONINI:3 in the last 72 hours BNP: No results found for this basename: PROBNP:3 in the last 72 hours D-Dimer: No results found for this basename: DDIMER:2 in the last 72 hours CBG:  Basename 08/06/11 2119 08/06/11 1704  GLUCAP 125* 161*   Hemoglobin A1C: No results found for this basename: HGBA1C in the last 72 hours Fasting Lipid Panel: No results found for this basename: CHOL,HDL,LDLCALC,TRIG,CHOLHDL,LDLDIRECT in the last 72 hours Thyroid Function Tests: No results found for this basename: TSH,T4TOTAL,FREET4,T3FREE,THYROIDAB in the last 72 hours Anemia Panel: No results found for this basename: VITAMINB12,FOLATE,FERRITIN,TIBC,IRON,RETICCTPCT in the last 72 hours Coagulation:  Basename 08/07/11 0608 08/06/11 1000  LABPROT 25.1* 26.4*  INR 2.23* 2.38*   Urine Drug Screen: Drugs of Abuse  No results found for this basename: labopia, cocainscrnur, labbenz, amphetmu, thcu, labbarb    Alcohol Level: No results found for this basename: ETH:2 in the last 72 hours Urinalysis:  Basename 08/06/11 1157  COLORURINE YELLOW  LABSPEC 1.021  PHURINE 6.0  GLUCOSEU NEGATIVE  HGBUR LARGE*  BILIRUBINUR NEGATIVE  KETONESUR NEGATIVE  PROTEINUR 100*  UROBILINOGEN 1.0  NITRITE NEGATIVE    LEUKOCYTESUR NEGATIVE    Recent Results (from the past 240 hour(s))  MRSA PCR SCREENING     Status: Normal   Collection Time   08/06/11  3:45 PM      Component Value Range Status Comment   MRSA by PCR NEGATIVE  NEGATIVE Final     Studies/Results: Dg Chest 2 View  08/06/2011  *RADIOLOGY REPORT*  Clinical Data: Shortness of breath.  History of diabetes.  CHEST - 2 VIEW  Comparison: Chest radiograph 07/22/2011 and 06/25/2008  Findings: Lung volumes are slightly low.  Heart size is borderline enlarged and stable.  Surgical clips noted over the right axillary region/ versus anterior right chest wall.  Bilateral peribronchial thickening appears unchanged compared to prior chest radiographs.No focal airspace disease, effusion, or pulmonary edema is identified.  Negative for pneumothorax.  No acute bony abnormalities identified.  Surgical clips project over the upper abdomen, on the lateral view.  IMPRESSION:  1.  Stable exam.  Mild chronic bilateral peribronchial thickening. This can be seen in the setting of acute or chronic bronchitis, smoking, or asthma. 2.  No definite airspace disease is identified.  Original Report Authenticated By: Britta Mccreedy, M.D.    Medications: Scheduled Meds:   . acetaminophen  650 mg Oral Once  . FLUoxetine  10 mg Oral Daily  . gabapentin  900 mg Oral TID  . imipramine  20 mg Oral QHS  . insulin aspart  0-9 Units Subcutaneous TID WC  . pantoprazole  40 mg Oral Q1200  . piperacillin-tazobactam (ZOSYN)  IV  3.375 g Intravenous Once  . piperacillin-tazobactam (ZOSYN)  IV  3.375 g Intravenous Q8H  . simvastatin  40 mg Oral q1800  . sodium chloride  1,000 mL Intravenous STAT  . sodium chloride  3 mL Intravenous Q12H  . vancomycin  1,000 mg Intravenous Once  . vancomycin  1,000 mg Intravenous Q12H  . warfarin  4 mg Oral q1800  . Warfarin - Pharmacist Dosing Inpatient   Does not apply q1800  . DISCONTD: sodium chloride   Intravenous Once  . DISCONTD: sodium chloride    Intravenous STAT  . DISCONTD: FLUoxetine  10 mg Oral Daily  . DISCONTD: vancomycin  1 g Intravenous Once   Continuous Infusions:   . sodium chloride 100 mL/hr at 08/07/11 0216   PRN Meds:.acetaminophen, acetaminophen, clonazePAM, diazepam, HYDROmorphone (DILAUDID) injection, ondansetron (ZOFRAN) IV, ondansetron     LOS: 1 day   [facility] Pager: [phone] 08/07/2011, 7:33 AM

## 2011-08-07 NOTE — Progress Notes (Signed)
ANTIBIOTIC CONSULT NOTE - Follow up  Pharmacy Consult for Vancomycin/Zosyn Indication: Sepsis and VTE treatment  Allergies  Allergen Reactions  . Morphine And Related Other (See Comments)    "mean"   Patient Measurements: Height: 4' (121.9 cm) (per nurse) Weight: 261 lb 7.5 oz (118.6 kg) IBW/kg (Calculated) : 22.4   Vital Signs: Temp: 98.4 F (36.9 C) (08/02 0800) Temp src: Oral (08/02 0800) BP: 131/47 mmHg (08/02 0700) Pulse Rate: 93  (08/02 0700)  Labs:  Basename 08/07/11 0828 08/07/11 0608 08/06/11 1000  HGB -- 14.0 15.4  HCT -- 42.6 46.1  PLT -- 311 396  APTT -- -- 30  LABPROT -- 25.1* 26.4*  INR -- 2.23* 2.38*  HEPARINUNFRC -- -- --  CREATININE 2.68* 2.58* 0.95  CKTOTAL -- -- --  CKMB -- -- --  TROPONINI -- -- --   Estimated Creatinine Clearance: 20.8 ml/min (by C-G formula based on Cr of 2.68).  Assessment:  76 yo M presented to ED with AMS, sepsis.  Suspected urinary source.   Day #2 Vanc and Zosyn.  Afebrile today.   WBC improving.  SCr up significantly overnight. ATN is suspected. CrCl 47ml/min/1.73m2.    Urine, blood cultures pending   Goal of Therapy:  Vancomycin 15-20 mcg/ml   Plan:   Continue Zosyn 3.375g IV q 8 hours (4 hour infusion) for now.   Reduce Vancomycin to 1.5g q48h with next dose tonight.  F/u renal function, CBC, cultures, clinical course, check trough at steady state.  Charolotte Eke, PharmD, pager [phone]. 08/07/2011,10:38 AM.

## 2011-08-07 NOTE — Progress Notes (Signed)
ANTICOAGULATION CONSULT NOTE - Follow up  Pharmacy Consult for Coumadin Indication: VTE treatment  Allergies  Allergen Reactions  . Morphine And Related Other (See Comments)    "mean"   Patient Measurements: Height: 4' (121.9 cm) (per nurse) Weight: 261 lb 7.5 oz (118.6 kg) IBW/kg (Calculated) : 22.4   Vital Signs: Temp: 98.4 F (36.9 C) (08/02 0800) Temp src: Oral (08/02 0800) BP: 131/47 mmHg (08/02 0700) Pulse Rate: 93  (08/02 0700)  Labs:  Basename 08/07/11 0828 08/07/11 0608 08/06/11 1000  HGB -- 14.0 15.4  HCT -- 42.6 46.1  PLT -- 311 396  APTT -- -- 30  LABPROT -- 25.1* 26.4*  INR -- 2.23* 2.38*  HEPARINUNFRC -- -- --  CREATININE 2.68* 2.58* 0.95  CKTOTAL -- -- --  CKMB -- -- --  TROPONINI -- -- --   Estimated Creatinine Clearance: 20.8 ml/min (by C-G formula based on Cr of 2.68).  Medical History: Past Medical History  Diagnosis Date  . Diabetes mellitus   . Hypertension   . GERD (gastroesophageal reflux disease)   . Anxiety   . Depression   . Peripheral vascular disease   . Neuropathy    Assessment:  15 yoM presents to ED with AMS, sepsis. On chronic Coumadin 4mg /day for hx DVTs. Obese with bilateral AKAs.       INR in therapeutic range this am.  Broad-spectrum abx can increase sensitivity to Coumadin.  Hgb, plts WNL.  No bleeding reported.  Goal of Therapy:  INR 2-3 Monitor platelets by anticoagulation protocol: Yes   Plan:  Cont with Coumadin home dose 4mg  po daily. F/u PT/INR daily.  Charolotte Eke, PharmD, pager [phone]. 08/07/2011,10:42 AM.

## 2011-08-07 NOTE — Progress Notes (Signed)
76 yo male with hx of Bilat AKA admitted at 1600 on 08/06/11 to stepdown unit with r/o sepsis, AMS. IVF and abx therapy - vancomycin and zosyn. VSS, no complaints verbalized. A/O x 4, follows commands. Plan of care reviewed, verbalized understanding of treatment.

## 2011-08-07 NOTE — Progress Notes (Signed)
CARE MANAGEMENT NOTE 08/07/2011  Patient:  Mark Russell, Mark Russell   Account Number:  [id number]  Date Initiated:  08/07/2011  Documentation initiated by:  DAVIS,RHONDA  Subjective/Objective Assessment:   pagtient with hx of bil bka, wheelchair bound, found unresponsvie by wife,     Action/Plan:   lives at home with family   Anticipated DC Date:  08/10/2011   Anticipated DC Plan:  HOME/SELF CARE  In-house referral  NA      DC Planning Services  NA      [facility] Choice  NA   Choice offered to / List presented to:  NA   DME arranged  NA      DME agency  NA     HH arranged  NA      HH agency  NA   Status of service:  In process, will continue to follow Medicare Important Message given?  NA - LOS <3 / Initial given by admissions (If response is "NO", the following Medicare IM given date fields will be blank) Date Medicare IM given:   Date Additional Medicare IM given:    Discharge Disposition:    Per UR Regulation:  Reviewed for med. necessity/level of care/duration of stay  If discussed at Long Length of Stay Meetings, dates discussed:    Comments:  [id number]/Rhonda Earlene Plater, RN, BSN, CCM: CHART REVIEWED AND UPDATED. NO DISCHARGE NEEDS PRESENT AT THIS TIME. CASE MANAGEMENT [phone]

## 2011-08-07 NOTE — Consult Note (Signed)
WOC consult Note Reason for Consult:Administrative Coordinator contacted this Clinical research associate and requested an order for a bariatric bed with an air overlay.  Patient was noted to be at high risk for skin breakdown. I will not follow.  Please re-consult if needed. Thanks, Ladona Mow, MSN, RN, [facility], CWOCN [phone])

## 2011-08-07 NOTE — Consult Note (Signed)
Mark Russell 08/07/2011 Mark Russell D Requesting Physician:  Dr. Jomarie Longs  Reason for Consult:  Acute renal failure HPI: The patient is a 76 y.o. year-old with hx of DM, HTN, hx DVT's, WC-bound, was found poorly responsive by family yesterday and brought to ER by EMS. He was febrile and diaphoretic, WBC 19K.  BP was 102/65 and admit creatinine was 0.95.  Today creat increased to 2.58 and renal service asked to see pt for AKI. US showed 13-14 cm kidneys with no hydro, normal echogenicity. Pt feeling much better, do not see any + cultures yet. UA 0-2 wbc and 3-6 rbc's.   Creatinine, Ser  Date/Time Value Range Status  08/07/2011  8:28 AM 2.68* 0.50 - 1.35 mg/dL Final  04/11/9627  5:28 AM 2.58* 0.50 - 1.35 mg/dL Final     DELTA CHECK NOTED     REPEATED TO VERIFY  08/06/2011 10:00 AM 0.95  0.50 - 1.35 mg/dL Final  41/32/4401 02:72 PM 0.90  0.50 - 1.35 mg/dL Final    Past Medical History:  Past Medical History  Diagnosis Date  . Diabetes mellitus   . Hypertension   . GERD (gastroesophageal reflux disease)   . Anxiety   . Depression   . Peripheral vascular disease   . Neuropathy     Past Surgical History:  Past Surgical History  Procedure Date  . Leg amputation through knee 06/2004  . Abdominal aortic aneurysm repair 1988  . Back surgery 1988, 1989    Family History: History reviewed. No pertinent family history. Social History:  reports that he quit smoking about 16 years ago. His smoking use included Cigarettes. He has never used smokeless tobacco. He reports that he does not drink alcohol. His drug history not on file.  Allergies:  Allergies  Allergen Reactions  . Morphine And Related Other (See Comments)    "mean"    Home medications: Prior to Admission medications   Medication Sig Start Date End Date Taking? Authorizing Provider  clonazePAM (KLONOPIN) 0.5 MG tablet Take 0.5 mg by mouth at bedtime as needed.   Yes Historical Provider, MD  diazepam (VALIUM) 10 MG tablet Take  10 mg by mouth 3 (three) times daily as needed.   Yes Historical Provider, MD  FLUoxetine (PROZAC) 10 MG tablet Take 10 mg by mouth daily.   Yes Historical Provider, MD  gabapentin (NEURONTIN) 300 MG capsule Take 900 mg by mouth 3 (three) times daily.   Yes Historical Provider, MD  glimepiride (AMARYL) 2 MG tablet Take 2 mg by mouth daily before breakfast.   Yes Historical Provider, MD  imipramine (TOFRANIL) 10 MG tablet Take 20 mg by mouth at bedtime.   Yes Historical Provider, MD  losartan-hydrochlorothiazide (HYZAAR) 100-25 MG per tablet Take 1 tablet by mouth daily.   Yes Historical Provider, MD  omeprazole (PRILOSEC) 20 MG capsule Take 40 mg by mouth 2 (two) times daily.   Yes Historical Provider, MD  simvastatin (ZOCOR) 40 MG tablet Take 40 mg by mouth every evening.   Yes Historical Provider, MD    Inpatient medications:    . FLUoxetine  10 mg Oral Daily  . gabapentin  900 mg Oral TID  . imipramine  20 mg Oral QHS  . insulin aspart  0-9 Units Subcutaneous TID WC  . pantoprazole  40 mg Oral Q1200  . piperacillin-tazobactam (ZOSYN)  IV  3.375 g Intravenous Q8H  . simvastatin  40 mg Oral q1800  . sodium chloride  3 mL Intravenous Q12H  .  vancomycin  1,500 mg Intravenous Q48H  . warfarin  4 mg Oral q1800  . Warfarin - Pharmacist Dosing Inpatient   Does not apply q1800  . DISCONTD: vancomycin  1,000 mg Intravenous Q12H    Review of Systems Gen:  Denies headache, fever, chills, sweats.  No weight loss. HEENT:  No visual change, sore throat, difficulty swallowing. Resp:  No difficulty breathing, DOE.  No cough or hemoptysis. Cardiac:  No chest pain, orthopnea, PND.  Denies edema. GI:   Denies abdominal pain.   No nausea, vomiting, diarrhea.  No constipation. GU:  Denies difficulty or change in voiding.  No change in urine color.     MS:  Denies joint pain or swelling.   Derm:  Denies skin rash or itching.  No chronic skin conditions.  Neuro:   Denies focal weakness, memory  problems, hx stroke or TIA.   Psych:  Denies symptoms of depression of anxiety.  No hallucination.    Labs: Basic Metabolic Panel:  Lab 08/07/11 2956 08/07/11 0608 08/06/11 1000  NA 138 135 137  K 3.2* 3.2* 3.4*  CL 95* 92* 91*  CO2 30 33* 25  GLUCOSE 149* 148* 285*  BUN 23 22 12   CREATININE 2.68* 2.58* 0.95  ALB -- -- --  CALCIUM 8.6 8.7 9.5  PHOS -- -- --   Liver Function Tests:  Lab 08/07/11 0608 08/06/11 1000  AST 378* 19  ALT 38 13  ALKPHOS 51 65  BILITOT 0.4 0.4  PROT 7.1 8.1  ALBUMIN 3.1* 3.8   No results found for this basename: LIPASE:3,AMYLASE:3 in the last 168 hours No results found for this basename: AMMONIA:3 in the last 168 hours CBC:  Lab 08/07/11 0608 08/06/11 1000  WBC 19.7* 23.9*  NEUTROABS -- 20.9*  HGB 14.0 15.4  HCT 42.6 46.1  MCV 96.6 96.8  PLT 311 396   PT/INR: @labrcntip (inr:5) Cardiac Enzymes: No results found for this basename: CKTOTAL:5,CKMB:5,CKMBINDEX:5,TROPONINI:5 in the last 168 hours CBG:  Lab 08/07/11 1141 08/07/11 0753 08/06/11 2119 08/06/11 1704  GLUCAP 166* 150* 125* 161*    Iron Studies: No results found for this basename: IRON:30,TIBC:30,TRANSFERRIN:30,FERRITIN:30 in the last 168 hours  Xrays/Other Studies: Dg Chest 2 View  08/06/2011  *RADIOLOGY REPORT*  Clinical Data: Shortness of breath.  History of diabetes.  CHEST - 2 VIEW  Comparison: Chest radiograph 07/22/2011 and 06/25/2008  Findings: Lung volumes are slightly low.  Heart size is borderline enlarged and stable.  Surgical clips noted over the right axillary region/ versus anterior right chest wall.  Bilateral peribronchial thickening appears unchanged compared to prior chest radiographs.No focal airspace disease, effusion, or pulmonary edema is identified.  Negative for pneumothorax.  No acute bony abnormalities identified.  Surgical clips project over the upper abdomen, on the lateral view.  IMPRESSION:  1.  Stable exam.  Mild chronic bilateral peribronchial  thickening. This can be seen in the setting of acute or chronic bronchitis, smoking, or asthma. 2.  No definite airspace disease is identified.  Original Report Authenticated By: Britta Mccreedy, M.D.   US Renal Port  08/07/2011  *RADIOLOGY REPORT*  Clinical Data: History of acute renal failure.  History of diabetes.  RENAL/URINARY TRACT ULTRASOUND COMPLETE  Comparison:  None.  Findings:  Right Kidney:  Right renal length is 13.4 cm.  Left Kidney:  Left renal length is 13.8 cm.  Examination of each kidney shows no evidence of hydronephrosis, solid or cystic mass, calculus, parenchymal loss, or parenchymal textural abnormality.  Bladder:  Bladder is decompressed by Foley catheter.  IMPRESSION: No renal abnormalities are evident by ultrasound.  Original Report Authenticated By: Crawford Givens, M.D.    Physical Exam:  Blood pressure 131/47, pulse 93, temperature 98.1 F (36.7 C), temperature source Oral, resp. rate 16, height 4' (1.219 m), weight 118.6 kg (261 lb 7.5 oz), SpO2 96.00%.  Gen: alert, oriented x 3, coughing, no distress Skin: no rash, cyanosis HEENT:  EOMI, sclera anicteric, throat clear Neck: no JVD, no bruits or LAN Chest: some coarse wheezing L base Heart: regular, no rub or gallop, no murmur Abdomen: soft, obese, +BS hyperactive Ext: bilat AKA no edema or ulceration Neuro: alert, Ox3, no focal deficit   Impression/Plan 1. AKI in setting of acute illness, fever, cough. No nephrotoxins, low volume has been sick several days. Suspect due to acute illness/vol depletion. He is making lots of urine now with IVF"s and would continue for now. Repeat UA, recheck creat in am, suspect he is improving already.  2. Fever ^WBC and cough? PNA not picked up on CXR. UCx is negative. Blood cx's pending.  3. DM 2 4. Bilat AKA  Will follow.   Vinson Moselle  MD [employer] [phone] pgr    [phone] cell 08/07/2011, 4:58 PM

## 2011-08-08 ENCOUNTER — Inpatient Hospital Stay (HOSPITAL_COMMUNITY): Payer: Medicare Other

## 2011-08-08 LAB — CBC
HCT: 37.5 % — ABNORMAL LOW (ref 39.0–52.0)
Hemoglobin: 12.1 g/dL — ABNORMAL LOW (ref 13.0–17.0)
MCH: 31.2 pg (ref 26.0–34.0)
MCHC: 32.3 g/dL (ref 30.0–36.0)
MCV: 96.6 fL (ref 78.0–100.0)
Platelets: 274 10*3/uL (ref 150–400)
RBC: 3.88 MIL/uL — ABNORMAL LOW (ref 4.22–5.81)
RDW: 15.6 % — ABNORMAL HIGH (ref 11.5–15.5)
WBC: 14.4 10*3/uL — ABNORMAL HIGH (ref 4.0–10.5)

## 2011-08-08 LAB — GLUCOSE, CAPILLARY
Glucose-Capillary: 159 mg/dL — ABNORMAL HIGH (ref 70–99)
Glucose-Capillary: 148 mg/dL — ABNORMAL HIGH (ref 70–99)
Glucose-Capillary: 123 mg/dL — ABNORMAL HIGH (ref 70–99)
Glucose-Capillary: 140 mg/dL — ABNORMAL HIGH (ref 70–99)

## 2011-08-08 LAB — BASIC METABOLIC PANEL
BUN: 31 mg/dL — ABNORMAL HIGH (ref 6–23)
CO2: 25 mEq/L (ref 19–32)
Calcium: 7.9 mg/dL — ABNORMAL LOW (ref 8.4–10.5)
Chloride: 97 mEq/L (ref 96–112)
Creatinine, Ser: 4.2 mg/dL — ABNORMAL HIGH (ref 0.50–1.35)
GFR calc Af Amer: 15 mL/min — ABNORMAL LOW (ref >90)
GFR calc non Af Amer: 13 mL/min — ABNORMAL LOW (ref >90)
Glucose, Bld: 152 mg/dL — ABNORMAL HIGH (ref 70–99)
Potassium: 3.6 mEq/L (ref 3.5–5.1)
Sodium: 137 mEq/L (ref 135–145)

## 2011-08-08 LAB — PROTIME-INR
INR: 2.78 — ABNORMAL HIGH (ref 0.00–1.49)
Prothrombin Time: 29.8 seconds — ABNORMAL HIGH (ref 11.6–15.2)

## 2011-08-08 LAB — CK: Total CK: 13318 U/L — ABNORMAL HIGH (ref 7–232)

## 2011-08-08 LAB — CREATININE, URINE, RANDOM: Creatinine, Urine: 32.4 mg/dL

## 2011-08-08 LAB — SODIUM, URINE, RANDOM: Sodium, Ur: 40 mEq/L

## 2011-08-08 MED ORDER — WARFARIN SODIUM 2 MG PO TABS
2.0000 mg | ORAL_TABLET | Freq: Once | ORAL | Status: AC
  Administered 2011-08-08: 2 mg via ORAL
  Filled 2011-08-08: qty 1

## 2011-08-08 MED ORDER — LEVOFLOXACIN 500 MG PO TABS
500.0000 mg | ORAL_TABLET | ORAL | Status: DC
  Administered 2011-08-08: 500 mg via ORAL
  Filled 2011-08-08: qty 1

## 2011-08-08 MED ORDER — PIPERACILLIN-TAZOBACTAM IN DEX 2-0.25 GM/50ML IV SOLN
2.2500 g | Freq: Three times a day (TID) | INTRAVENOUS | Status: DC
  Filled 2011-08-08: qty 50

## 2011-08-08 NOTE — Progress Notes (Signed)
ANTICOAGULATION CONSULT NOTE - Follow up  Pharmacy Consult for Coumadin Indication: VTE treatment  Allergies  Allergen Reactions  . Morphine And Related Other (See Comments)    "mean"   Patient Measurements: Height: 4' (121.9 cm) (per nurse) Weight: 261 lb 7.5 oz (118.6 kg) IBW/kg (Calculated) : 22.4   Vital Signs: Temp: 98.4 F (36.9 C) (08/03 0700) Temp src: Oral (08/03 0700) BP: 152/68 mmHg (08/03 0700) Pulse Rate: 94  (08/03 0700)  Labs:  Basename 08/08/11 0643 08/07/11 0828 08/07/11 0608 08/06/11 1000  HGB 12.1* -- 14.0 --  HCT 37.5* -- 42.6 46.1  PLT 274 -- 311 396  APTT -- -- -- 30  LABPROT 29.8* -- 25.1* 26.4*  INR 2.78* -- 2.23* 2.38*  HEPARINUNFRC -- -- -- --  CREATININE -- 2.68* 2.58* 0.95  CKTOTAL -- -- -- --  CKMB -- -- -- --  TROPONINI -- -- -- --   Estimated Creatinine Clearance: 20.8 ml/min (by C-G formula based on Cr of 2.68).  Medical History: Past Medical History  Diagnosis Date  . Diabetes mellitus   . Hypertension   . GERD (gastroesophageal reflux disease)   . Anxiety   . Depression   . Peripheral vascular disease   . Neuropathy    Assessment:  45 yoM presents to ED with AMS, sepsis. On chronic Coumadin 4mg /day for hx DVTs. Obese with bilateral AKAs.       INR in therapeutic range this am.  Broad-spectrum abx can increase sensitivity to Coumadin.  Hgb, plts decreased but WNL.  INR increased today, no bleeding reported.  Goal of Therapy:  INR 2-3 Monitor platelets by anticoagulation protocol: Yes   Plan:  Reduce Warfarin dose to 2mg  today. F/u PT/INR daily.  Otho Bellows PharmD Pager [phone] . 08/08/2011,7:42 AM.

## 2011-08-08 NOTE — Progress Notes (Signed)
Subjective: Good UOP, 2575 yesterday. Creat up 4.2. No complaints, transferred to floor yesterday.   Objective Vital signs in last 24 hours: Filed Vitals:   08/07/11 1500 08/07/11 1915 08/07/11 2230 08/08/11 0700  BP: 138/54 124/57 126/54 152/68  Pulse:  91 89 94  Temp:  98.4 F (36.9 C) 99.4 F (37.4 C) 98.4 F (36.9 C)  TempSrc:  Oral Oral Oral  Resp: 18 20 20 20   Height:      Weight:      SpO2:  96% 95% 98%   Weight change:   Intake/Output Summary (Last 24 hours) at 08/08/11 1345 Last data filed at 08/08/11 1244  Gross per 24 hour  Intake   1480 ml  Output   2525 ml  Net  -1045 ml   Labs: Basic Metabolic Panel:  Lab 08/08/11 4098 08/07/11 0828 08/07/11 0608 08/06/11 1000  NA 137 138 135 137  K 3.6 3.2* 3.2* 3.4*  CL 97 95* 92* 91*  CO2 25 30 33* 25  GLUCOSE 152* 149* 148* 285*  BUN 31* 23 22 12   CREATININE 4.20* 2.68* 2.58* 0.95  ALB -- -- -- --  CALCIUM 7.9* 8.6 8.7 9.5  PHOS -- -- -- --   Liver Function Tests:  Lab 08/07/11 0608 08/06/11 1000  AST 378* 19  ALT 38 13  ALKPHOS 51 65  BILITOT 0.4 0.4  PROT 7.1 8.1  ALBUMIN 3.1* 3.8   No results found for this basename: LIPASE:3,AMYLASE:3 in the last 168 hours No results found for this basename: AMMONIA:3 in the last 168 hours CBC:  Lab 08/08/11 0643 08/07/11 0608 08/06/11 1000  WBC 14.4* 19.7* 23.9*  NEUTROABS -- -- 20.9*  HGB 12.1* 14.0 15.4  HCT 37.5* 42.6 46.1  MCV 96.6 96.6 96.8  PLT 274 311 396   PT/INR: @labrcntip (inr:5) Cardiac Enzymes:  Lab 08/08/11 0645  CKTOTAL 11914*  CKMB --  CKMBINDEX --  TROPONINI --   CBG:  Lab 08/08/11 1132 08/08/11 0727 08/07/11 2222 08/07/11 1734 08/07/11 1141  GLUCAP 148* 159* 142* 168* 166*    Iron Studies: No results found for this basename: IRON:30,TIBC:30,TRANSFERRIN:30,FERRITIN:30 in the last 168 hours  Physical Exam:  Blood pressure 152/68, pulse 94, temperature 98.4 F (36.9 C), temperature source Oral, resp. rate 20, height 4' (1.219 m),  weight 118.6 kg (261 lb 7.5 oz), SpO2 98.00%.  Physical Exam: Blood pressure 131/47, pulse 93, temperature 98.1 F (36.7 C), temperature source Oral, resp. rate 16, height 4' (1.219 m), weight 118.6 kg (261 lb 7.5 oz), SpO2 96.00%.  Gen: alert, obese, NAD Skin: no rash, cyanosis  Neck: no JVD, no bruits or LAN  Chest: clear bilat Heart: regular, no rub or gallop, no murmur  Abdomen: soft, obese, +BS hyperactive  Ext: bilat AKA no edema or ulceration  Neuro: alert, Ox3, no focal deficit   UA > 0-2wbc, 3-6rbc, large bld UNa - 40,  UCreat- 32.4 CXR- bronchial thickening CPK- 13,300 today   Impression/Plan  1. AKI due to ATN from rhabdomyolysis. Rhabdomyolysis due to acute infection (viral?) and/or statins. Making urine which should be a good sign.  No need for HD, not uremic, no vol excess. Supportive care, may need HD if significantly worsens, but hopefully not.  2. Fever / ^ WBC- on empiric abx with levaquin now. Vanc/zosyn stopped. UCx neg.   3. DM 2 4. Bilat AKA 5. Obesity Vinson Moselle  MD [facility] [phone] pgr    [phone] cell 08/08/2011, 1:45 PM

## 2011-08-08 NOTE — Progress Notes (Addendum)
Assessment/Plan  1. Sepsis -improved Urine cultures and blood cultures negative Congestion and cough, raising concern for bronchitis vs early pneumonia as source although CXR was normal on 8/1, will repeat 2 view CXR today  DC IV Vancomycin and Zosyn Will transition to Oral levaquin-renal dosing  IVF  Repeat Lactic acid normalized   2. Toxic metabolic encephalopathy  Secondary to sepsis  resolved No meningeal signs   3. ARF: ?ATN secondary to sepsis/Abx, and rhabdomyolysis, CK 13K  worsening creatinine but urine output has picked up well (2500 in last 24H) Etiology of rhabdo unclear, will DC simvastatin Will stop Vanc/Zosyn renal USG normal Vanc level-random was ok,  continue IVF, I/Os,   Repeat CK in am Greatly appreciate Dr.Schertz's consult Hopefully kidneys will improve especially since urine output has   4. Lactic acidosis: secondary to sepsis, repeat normalized, abdominal exam benign not suggestive of bowel pathology   5. H/o DVT: continue coumadin per pharmacy  6. PVD S/P Bilateral AKA   7. DM: hold amaryl, SSI   Code status: DNR    Mark Russell  [facility]  Pager: [phone]  08/07/2011,     Subjective: Very upset about needing to give up his stepdown bed, fels ok, congestion and cough noted  Objective: Vital signs in last 24 hours: Temp:  [98.1 F (36.7 C)-99.4 F (37.4 C)] 98.4 F (36.9 C) (08/03 0700) Pulse Rate:  [89-94] 94  (08/03 0700) Resp:  [18-20] 20  (08/03 0700) BP: (124-152)/(54-68) 152/68 mmHg (08/03 0700) SpO2:  [95 %-98 %] 98 % (08/03 0700) Weight change:  Last BM Date: 08/07/11  Intake/Output from previous day: 08/02 0701 - 08/03 0700 In: 1600 [I.V.:1100; IV Piggyback:500] Out: 2575 [Urine:2575] Total I/O In: 240 [P.O.:240] Out: -    Physical Exam: Alert awake oriented x3 in no acute distress, slight flushing of the face  HEENT : Pupils equal reactive to light, oral mucosa moist and pink  CVS: S1S2/RRR, no m/r/g    Lungs: scattered wheezing, poor air movement bilaterally Abd: soft, obese, a large vertical surgical scar, multiple small ventral abdominal hernia is without evidence of strangulation  No flank tenderness  Ext: bilateral AKA, no evidence of skin breakdown    Lab Results: Basic Metabolic Panel:  Basename 08/08/11 0643 08/07/11 0828  NA 137 138  K 3.6 3.2*  CL 97 95*  CO2 25 30  GLUCOSE 152* 149*  BUN 31* 23  CREATININE 4.20* 2.68*  CALCIUM 7.9* 8.6  MG -- --  PHOS -- --   Liver Function Tests:  Basename 08/07/11 0608 08/06/11 1000  AST 378* 19  ALT 38 13  ALKPHOS 51 65  BILITOT 0.4 0.4  PROT 7.1 8.1  ALBUMIN 3.1* 3.8   No results found for this basename: LIPASE:2,AMYLASE:2 in the last 72 hours No results found for this basename: AMMONIA:2 in the last 72 hours CBC:  Basename 08/08/11 0643 08/07/11 0608 08/06/11 1000  WBC 14.4* 19.7* --  NEUTROABS -- -- 20.9*  HGB 12.1* 14.0 --  HCT 37.5* 42.6 --  MCV 96.6 96.6 --  PLT 274 311 --   Cardiac Enzymes: No results found for this basename: CKTOTAL:3,CKMB:3,CKMBINDEX:3,TROPONINI:3 in the last 72 hours BNP: No results found for this basename: PROBNP:3 in the last 72 hours D-Dimer: No results found for this basename: DDIMER:2 in the last 72 hours CBG:  Basename 08/08/11 0727 08/07/11 2222 08/07/11 1734 08/07/11 1141 08/07/11 0753 08/06/11 2119  GLUCAP 159* 142* 168* 166* 150* 125*   Hemoglobin A1C: No results  found for this basename: HGBA1C in the last 72 hours Fasting Lipid Panel: No results found for this basename: CHOL,HDL,LDLCALC,TRIG,CHOLHDL,LDLDIRECT in the last 72 hours Thyroid Function Tests: No results found for this basename: TSH,T4TOTAL,FREET4,T3FREE,THYROIDAB in the last 72 hours Anemia Panel: No results found for this basename: VITAMINB12,FOLATE,FERRITIN,TIBC,IRON,RETICCTPCT in the last 72 hours Coagulation:  Basename 08/08/11 0643 08/07/11 0608  LABPROT 29.8* 25.1*  INR 2.78* 2.23*   Urine Drug  Screen: Drugs of Abuse  No results found for this basename: labopia,  cocainscrnur,  labbenz,  amphetmu,  thcu,  labbarb    Alcohol Level: No results found for this basename: ETH:2 in the last 72 hours Urinalysis:  Basename 08/06/11 1157  COLORURINE YELLOW  LABSPEC 1.021  PHURINE 6.0  GLUCOSEU NEGATIVE  HGBUR LARGE*  BILIRUBINUR NEGATIVE  KETONESUR NEGATIVE  PROTEINUR 100*  UROBILINOGEN 1.0  NITRITE NEGATIVE  LEUKOCYTESUR NEGATIVE    Recent Results (from the past 240 hour(s))  CULTURE, BLOOD (ROUTINE X 2)     Status: Normal (Preliminary result)   Collection Time   08/06/11 10:00 AM      Component Value Range Status Comment   Specimen Description BLOOD LEFT HAND   Final    Special Requests BOTTLES DRAWN AEROBIC AND ANAEROBIC [facility] EACH   Final    Culture  Setup Time 08/06/2011 12:54   Final    Culture     Final    Value:        BLOOD CULTURE RECEIVED NO GROWTH TO DATE CULTURE WILL BE HELD FOR 5 DAYS BEFORE ISSUING A FINAL NEGATIVE REPORT   Report Status PENDING   Incomplete   CULTURE, BLOOD (ROUTINE X 2)     Status: Normal (Preliminary result)   Collection Time   08/06/11 11:32 AM      Component Value Range Status Comment   Specimen Description BLOOD LEFT ARM   Final    Special Requests BOTTLES DRAWN AEROBIC AND ANAEROBIC 3CC EACH   Final    Culture  Setup Time 08/06/2011 23:11   Final    Culture     Final    Value:        BLOOD CULTURE RECEIVED NO GROWTH TO DATE CULTURE WILL BE HELD FOR 5 DAYS BEFORE ISSUING A FINAL NEGATIVE REPORT   Report Status PENDING   Incomplete   URINE CULTURE     Status: Normal   Collection Time   08/06/11 11:57 AM      Component Value Range Status Comment   Specimen Description URINE, CATHETERIZED   Final    Special Requests NONE   Final    Culture  Setup Time 08/06/2011 19:58   Final    Colony Count NO GROWTH   Final    Culture NO GROWTH   Final    Report Status 08/07/2011 FINAL   Final   MRSA PCR SCREENING     Status: Normal   Collection Time     08/06/11  3:45 PM      Component Value Range Status Comment   MRSA by PCR NEGATIVE  NEGATIVE Final     Studies/Results: Dg Chest 2 View  08/06/2011  *RADIOLOGY REPORT*  Clinical Data: Shortness of breath.  History of diabetes.  CHEST - 2 VIEW  Comparison: Chest radiograph 07/22/2011 and 06/25/2008  Findings: Lung volumes are slightly low.  Heart size is borderline enlarged and stable.  Surgical clips noted over the right axillary region/ versus anterior right chest wall.  Bilateral peribronchial thickening appears unchanged compared  to prior chest radiographs.No focal airspace disease, effusion, or pulmonary edema is identified.  Negative for pneumothorax.  No acute bony abnormalities identified.  Surgical clips project over the upper abdomen, on the lateral view.  IMPRESSION:  1.  Stable exam.  Mild chronic bilateral peribronchial thickening. This can be seen in the setting of acute or chronic bronchitis, smoking, or asthma. 2.  No definite airspace disease is identified.  Original Report Authenticated By: Britta Mccreedy, M.D.   US Renal Port  08/07/2011  *RADIOLOGY REPORT*  Clinical Data: History of acute renal failure.  History of diabetes.  RENAL/URINARY TRACT ULTRASOUND COMPLETE  Comparison:  None.  Findings:  Right Kidney:  Right renal length is 13.4 cm.  Left Kidney:  Left renal length is 13.8 cm.  Examination of each kidney shows no evidence of hydronephrosis, solid or cystic mass, calculus, parenchymal loss, or parenchymal textural abnormality.  Bladder:  Bladder is decompressed by Foley catheter.  IMPRESSION: No renal abnormalities are evident by ultrasound.  Original Report Authenticated By: Crawford Givens, M.D.    Medications: Scheduled Meds:    . FLUoxetine  10 mg Oral Daily  . gabapentin  900 mg Oral TID  . imipramine  20 mg Oral QHS  . insulin aspart  0-9 Units Subcutaneous TID WC  . levofloxacin  500 mg Oral Q48H  . pantoprazole  40 mg Oral Q1200  . simvastatin  40 mg Oral q1800  .  sodium chloride  3 mL Intravenous Q12H  . warfarin  2 mg Oral ONCE-1800  . Warfarin - Pharmacist Dosing Inpatient   Does not apply q1800  . DISCONTD: piperacillin-tazobactam (ZOSYN)  IV  2.25 g Intravenous Q8H  . DISCONTD: piperacillin-tazobactam (ZOSYN)  IV  3.375 g Intravenous Q8H  . DISCONTD: vancomycin  1,500 mg Intravenous Q48H  . DISCONTD: vancomycin  1,000 mg Intravenous Q12H  . DISCONTD: warfarin  4 mg Oral q1800   Continuous Infusions:    . sodium chloride 100 mL/hr at 08/08/11 0043   PRN Meds:.acetaminophen, acetaminophen, clonazePAM, diazepam, HYDROmorphone (DILAUDID) injection, ondansetron (ZOFRAN) IV, ondansetron     LOS: 2 days   [facility] Pager: [phone] 08/08/2011, 10:09 AM

## 2011-08-08 NOTE — Progress Notes (Signed)
ANTIBIOTIC CONSULT NOTE - Follow up  Pharmacy Consult for Vancomycin/Zosyn Indication: Sepsis and VTE treatment  Allergies  Allergen Reactions  . Morphine And Related Other (See Comments)    "mean"   Patient Measurements: Height: 4' (121.9 cm) (per nurse) Weight: 261 lb 7.5 oz (118.6 kg) IBW/kg (Calculated) : 22.4   Vital Signs: Temp: 98.4 F (36.9 C) (08/03 0700) Temp src: Oral (08/03 0700) BP: 152/68 mmHg (08/03 0700) Pulse Rate: 94  (08/03 0700)  Labs:  Basename 08/08/11 0643 08/07/11 0828 08/07/11 0608 08/06/11 1000  HGB 12.1* -- 14.0 --  HCT 37.5* -- 42.6 46.1  PLT 274 -- 311 396  APTT -- -- -- 30  LABPROT 29.8* -- 25.1* 26.4*  INR 2.78* -- 2.23* 2.38*  HEPARINUNFRC -- -- -- --  CREATININE 4.20* 2.68* 2.58* --  CKTOTAL -- -- -- --  CKMB -- -- -- --  TROPONINI -- -- -- --   Estimated Creatinine Clearance: 13.3 ml/min (by C-G formula based on Cr of 4.2).  Assessment:  76 yo M presented to ED with AMS, sepsis.  Suspected urinary source.   Day #3 Vanc and Zosyn.  WBC improving.  SCr up significantly again. ATN is suspected. CrCl 51ml/min/1.73m2.    Urine cx no growth-final, blood cultures pending   Goal of Therapy:  Vancomycin 15-20 mcg/ml   Plan:   Reduce Zosyn to 2.25g IV q 8 hours    Vancomycin was reduced to 1.5g q48h yesterday  F/u renal function, CBC, cultures, clinical course, Vanc trough at steady state.  Otho Bellows PharmD Pager [phone] . 08/08/2011,7:55 AM.

## 2011-08-09 ENCOUNTER — Inpatient Hospital Stay (HOSPITAL_COMMUNITY): Payer: Medicare Other

## 2011-08-09 DIAGNOSIS — E872 Acidosis: Secondary | ICD-10-CM

## 2011-08-09 DIAGNOSIS — R4182 Altered mental status, unspecified: Secondary | ICD-10-CM

## 2011-08-09 DIAGNOSIS — E8729 Other acidosis: Secondary | ICD-10-CM

## 2011-08-09 DIAGNOSIS — J9600 Acute respiratory failure, unspecified whether with hypoxia or hypercapnia: Secondary | ICD-10-CM

## 2011-08-09 DIAGNOSIS — R6520 Severe sepsis without septic shock: Secondary | ICD-10-CM

## 2011-08-09 DIAGNOSIS — R0902 Hypoxemia: Secondary | ICD-10-CM

## 2011-08-09 DIAGNOSIS — A419 Sepsis, unspecified organism: Principal | ICD-10-CM

## 2011-08-09 LAB — BASIC METABOLIC PANEL
BUN: 36 mg/dL — ABNORMAL HIGH (ref 6–23)
BUN: 38 mg/dL — ABNORMAL HIGH (ref 6–23)
CO2: 25 mEq/L (ref 19–32)
CO2: 26 mEq/L (ref 19–32)
Calcium: 8.4 mg/dL (ref 8.4–10.5)
Calcium: 8.2 mg/dL — ABNORMAL LOW (ref 8.4–10.5)
Chloride: 98 mEq/L (ref 96–112)
Chloride: 97 mEq/L (ref 96–112)
Creatinine, Ser: 4.92 mg/dL — ABNORMAL HIGH (ref 0.50–1.35)
Creatinine, Ser: 5.3 mg/dL — ABNORMAL HIGH (ref 0.50–1.35)
GFR calc Af Amer: 12 mL/min — ABNORMAL LOW (ref >90)
GFR calc Af Amer: 11 mL/min — ABNORMAL LOW (ref >90)
GFR calc non Af Amer: 10 mL/min — ABNORMAL LOW (ref >90)
GFR calc non Af Amer: 10 mL/min — ABNORMAL LOW (ref >90)
Glucose, Bld: 136 mg/dL — ABNORMAL HIGH (ref 70–99)
Glucose, Bld: 123 mg/dL — ABNORMAL HIGH (ref 70–99)
Potassium: 4.9 mEq/L (ref 3.5–5.1)
Potassium: 3.9 mEq/L (ref 3.5–5.1)
Sodium: 136 mEq/L (ref 135–145)
Sodium: 136 mEq/L (ref 135–145)

## 2011-08-09 LAB — BLOOD GAS, ARTERIAL
Acid-base deficit: 4.2 mmol/L — ABNORMAL HIGH (ref 0.0–2.0)
Acid-base deficit: 2.4 mmol/L — ABNORMAL HIGH (ref 0.0–2.0)
Bicarbonate: 26 mEq/L — ABNORMAL HIGH (ref 20.0–24.0)
Bicarbonate: 27.8 mEq/L — ABNORMAL HIGH (ref 20.0–24.0)
Delivery systems: POSITIVE
Drawn by: 340271
Expiratory PAP: 7
FIO2: 0.4 %
Inspiratory PAP: 14
Mode: POSITIVE
O2 Content: 3 L/min
O2 Saturation: 87.6 %
O2 Saturation: 94.3 %
Patient temperature: 98.6
Patient temperature: 98.6
RATE: 22 resp/min
TCO2: 24.8 mmol/L (ref 0–100)
TCO2: 25.8 mmol/L (ref 0–100)
pCO2 arterial: 76.4 mmHg (ref 35.0–45.0)
pCO2 arterial: 75.7 mmHg (ref 35.0–45.0)
pH, Arterial: 7.158 — CL (ref 7.350–7.450)
pH, Arterial: 7.19 — CL (ref 7.350–7.450)
pO2, Arterial: 58.3 mmHg — ABNORMAL LOW (ref 80.0–100.0)
pO2, Arterial: 79 mmHg — ABNORMAL LOW (ref 80.0–100.0)

## 2011-08-09 LAB — GLUCOSE, CAPILLARY
Glucose-Capillary: 143 mg/dL — ABNORMAL HIGH (ref 70–99)
Glucose-Capillary: 123 mg/dL — ABNORMAL HIGH (ref 70–99)
Glucose-Capillary: 129 mg/dL — ABNORMAL HIGH (ref 70–99)
Glucose-Capillary: 120 mg/dL — ABNORMAL HIGH (ref 70–99)

## 2011-08-09 LAB — PRO B NATRIURETIC PEPTIDE: Pro B Natriuretic peptide (BNP): 1971 pg/mL — ABNORMAL HIGH (ref 0–125)

## 2011-08-09 MED ORDER — FLUMAZENIL 0.5 MG/5ML IV SOLN
0.5000 mg | Freq: Once | INTRAVENOUS | Status: AC
  Administered 2011-08-09: 0.5 mg via INTRAVENOUS
  Filled 2011-08-09: qty 5

## 2011-08-09 MED ORDER — LEVALBUTEROL HCL 0.63 MG/3ML IN NEBU
0.6300 mg | INHALATION_SOLUTION | Freq: Four times a day (QID) | RESPIRATORY_TRACT | Status: DC
  Administered 2011-08-09 – 2011-08-13 (×14): 0.63 mg via RESPIRATORY_TRACT
  Filled 2011-08-09 (×19): qty 3

## 2011-08-09 MED ORDER — WARFARIN SODIUM 1 MG PO TABS
1.0000 mg | ORAL_TABLET | Freq: Once | ORAL | Status: AC
  Administered 2011-08-09: 1 mg via ORAL
  Filled 2011-08-09: qty 1

## 2011-08-09 MED ORDER — NALOXONE HCL 0.4 MG/ML IJ SOLN
0.2000 mg | Freq: Once | INTRAMUSCULAR | Status: AC
  Administered 2011-08-09: 0.2 mg via INTRAVENOUS

## 2011-08-09 MED ORDER — NALOXONE HCL 0.4 MG/ML IJ SOLN
0.2500 mg/h | INTRAVENOUS | Status: DC
  Administered 2011-08-09 – 2011-08-10 (×4): 0.25 mg/h via INTRAVENOUS
  Filled 2011-08-09 (×4): qty 2.5

## 2011-08-09 MED ORDER — PANTOPRAZOLE SODIUM 40 MG IV SOLR
40.0000 mg | Freq: Every day | INTRAVENOUS | Status: DC
  Administered 2011-08-09 – 2011-08-12 (×4): 40 mg via INTRAVENOUS
  Filled 2011-08-09 (×5): qty 40

## 2011-08-09 MED ORDER — NALOXONE HCL 0.4 MG/ML IJ SOLN
INTRAMUSCULAR | Status: AC
  Administered 2011-08-09: 0.2 mg via INTRAVENOUS
  Filled 2011-08-09: qty 1

## 2011-08-09 MED ORDER — LEVOFLOXACIN IN D5W 500 MG/100ML IV SOLN
500.0000 mg | INTRAVENOUS | Status: DC
  Administered 2011-08-10 – 2011-08-12 (×2): 500 mg via INTRAVENOUS
  Filled 2011-08-09 (×2): qty 100

## 2011-08-09 MED ORDER — IPRATROPIUM BROMIDE 0.02 % IN SOLN
0.5000 mg | Freq: Four times a day (QID) | RESPIRATORY_TRACT | Status: DC
  Administered 2011-08-09 – 2011-08-13 (×14): 0.5 mg via RESPIRATORY_TRACT
  Filled 2011-08-09 (×15): qty 2.5

## 2011-08-09 MED ORDER — VITAMINS A & D EX OINT
TOPICAL_OINTMENT | CUTANEOUS | Status: AC
  Administered 2011-08-09: 5
  Filled 2011-08-09: qty 5

## 2011-08-09 MED ORDER — LEVOFLOXACIN IN D5W 500 MG/100ML IV SOLN
500.0000 mg | INTRAVENOUS | Status: DC
  Filled 2011-08-09: qty 100

## 2011-08-09 MED ORDER — GABAPENTIN 100 MG PO CAPS
100.0000 mg | ORAL_CAPSULE | Freq: Three times a day (TID) | ORAL | Status: DC
  Filled 2011-08-09 (×3): qty 1

## 2011-08-09 MED ORDER — FUROSEMIDE 10 MG/ML IJ SOLN
160.0000 mg | Freq: Two times a day (BID) | INTRAVENOUS | Status: DC
  Administered 2011-08-09 – 2011-08-11 (×5): 160 mg via INTRAVENOUS
  Filled 2011-08-09 (×8): qty 16

## 2011-08-09 NOTE — Consult Note (Signed)
Name: Deaire Mcwhirter MRN: [id number] DOB: 1935/08/11    LOS: 3  Referring Provider: [facility]) Reason for Referral:  Hypercarbic resp failure.  PULMONARY / CRITICAL CARE MEDICINE  HPI:  76 yo former smoker with a plethora of health problems was found with AMS 8/1 and admitted to [facility]. Found to have rhabdo and acute renal failure. Treated with IVF and renal was consulted. 8/4 he was lethargic and abg revealed hypercarbic resp failure most likely exacerbated by renal failure and continuation of home pain and anxiety medication. Transferred to ICU and DNR temporarily suspended with short term intubation allowed.  NIMVS instituted and PCCM asked to assist in care.   Past Medical History  Diagnosis Date  . Diabetes mellitus   . Hypertension   . GERD (gastroesophageal reflux disease)   . Anxiety   . Depression   . Peripheral vascular disease   . Neuropathy    Past Surgical History  Procedure Date  . Leg amputation through knee 06/2004  . Abdominal aortic aneurysm repair 1988  . Back surgery 1988, 1989   Prior to Admission medications   Medication Sig Start Date End Date Taking? Authorizing Provider  clonazePAM (KLONOPIN) 0.5 MG tablet Take 0.5 mg by mouth at bedtime as needed.   Yes Historical Provider, MD  diazepam (VALIUM) 10 MG tablet Take 10 mg by mouth 3 (three) times daily as needed.   Yes Historical Provider, MD  FLUoxetine (PROZAC) 10 MG tablet Take 10 mg by mouth daily.   Yes Historical Provider, MD  gabapentin (NEURONTIN) 300 MG capsule Take 900 mg by mouth 3 (three) times daily.   Yes Historical Provider, MD  glimepiride (AMARYL) 2 MG tablet Take 2 mg by mouth daily before breakfast.   Yes Historical Provider, MD  imipramine (TOFRANIL) 10 MG tablet Take 20 mg by mouth at bedtime.   Yes Historical Provider, MD  losartan-hydrochlorothiazide (HYZAAR) 100-25 MG per tablet Take 1 tablet by mouth daily.   Yes Historical Provider, MD  omeprazole (PRILOSEC) 20 MG capsule Take 40 mg  by mouth 2 (two) times daily.   Yes Historical Provider, MD  simvastatin (ZOCOR) 40 MG tablet Take 40 mg by mouth every evening.   Yes Historical Provider, MD   Allergies Allergies  Allergen Reactions  . Morphine And Related Other (See Comments)    "mean"    Family History History reviewed. No pertinent family history. Social History  reports that he quit smoking about 16 years ago. His smoking use included Cigarettes. He has never used smokeless tobacco. He reports that he does not drink alcohol. His drug history not on file.  Review Of Systems:  NA secondary to decreased loc.  Brief patient description:  MOWM on NIMVS  Events Since Admission: 8/4 tx to ICU.  Current Status: Guarded Vital Signs: Temp:  [98 F (36.7 C)-98.6 F (37 C)] 98.5 F (36.9 C) (08/04 0839) Pulse Rate:  [77-101] 100  (08/04 0839) Resp:  [18-20] 20  (08/04 0839) BP: (129-171)/(57-78) 157/78 mmHg (08/04 0839) SpO2:  [90 %-96 %] 91 % (08/04 0839)  Physical Examination: General:  MOWM on bipap Neuro:  Follows commands but lethargic when not stimulated. Has received romazicon.  HEENT:  Full fm in place Neck:  No jvd Cardiovascular:  hsd Lungs:  Decrease air movement despite bipap  Abdomen:  obses +bs Musculoskeletal:  Bilateral AKA Skin:  warm  Active Problems:  Hypoxia  Leukocytosis  Sepsis  PVD (peripheral vascular disease)  S/P AKA (above knee amputation) bilateral  DVT (deep venous thrombosis)  ARF (acute renal failure)   ASSESSMENT AND PLAN  PULMONARY  Lab 08/09/11 1000 08/06/11 1030  PHART 7.158* 7.442  PCO2ART 76.4* 43.4  PO2ART 58.3* 67.3*  HCO3 26.0* 29.1*  O2SAT 87.6 93.6   Ventilator Settings:   CXR:   Dg Chest 2 View  08/08/2011  *RADIOLOGY REPORT*  Clinical Data: Suspect pneumonia.  CHEST - 2 VIEW  Comparison: 08/06/2011  Findings: Heart size appears normal.  Chronic bilateral peribronchial thickening is again noted and appears unchanged from previous exam.  No  pleural effusion or interstitial edema.  No airspace consolidation.  IMPRESSION:  1.  Stable chronic bilateral peribronchial thickening.  Original Report Authenticated By: Rosealee Albee, M.D.   Dg Chest Port 1 View  08/09/2011  *RADIOLOGY REPORT*  Clinical Data: Respiratory distress.  PORTABLE CHEST - 1 VIEW  Comparison: 08/08/2011  Findings: Heart size appears enlarged.  Lung volumes are low and there is diffuse pulmonary edema.  Focal area of consolidation is noted in the right upper lobe.  IMPRESSION:  1.  Pulmonary edema 2.  Right upper lobe consolidation.  Original Report Authenticated By: Rosealee Albee, M.D.    ETT:    A:  Hypercarbic resp failure exacerbated by renal failure and oversedation.   P. -NIMVS -reverse valium -repeat abg if not improved will need intubation but short term only. -diuresis  -add bd's  CARDIOVASCULAR  Lab 08/07/11 0609 08/06/11 1605 08/06/11 1000  TROPONINI -- -- --  LATICACIDVEN 1.2 3.7* 8.2*  PROBNP -- -- --   ECG:  Aflutter Lines:  A: CAD/ AFib/flutter P:  -coumadin   RENAL  Lab 08/09/11 0910 08/08/11 0643 08/07/11 0828 08/07/11 0608 08/06/11 1000  NA 136 137 138 135 137  K 4.9 3.6 -- -- --  CL 98 97 95* 92* 91*  CO2 25 25 30  33* 25  BUN 36* 31* 23 22 12   CREATININE 4.92* 4.20* 2.68* 2.58* 0.95  CALCIUM 8.4 7.9* 8.6 8.7 9.5  MG -- -- -- -- --  PHOS -- -- -- -- --   Intake/Output      08/03 0701 - 08/04 0700 08/04 0701 - 08/05 0700   P.O. 720    I.V. (mL/kg)  10 (0.1)   IV Piggyback     Total Intake(mL/kg) 720 (6.1) 10 (0.1)   Urine (mL/kg/hr) 2325 (0.8)    Total Output 2325    Net -1605 +10         Foley:  8/4  A: ARF P:   -per renal  GASTROINTESTINAL  Lab 08/07/11 0608 08/06/11 1000  AST 378* 19  ALT 38 13  ALKPHOS 51 65  BILITOT 0.4 0.4  PROT 7.1 8.1  ALBUMIN 3.1* 3.8    A:  No acute issue P:     HEMATOLOGIC  Lab 08/08/11 0643 08/07/11 0608 08/06/11 1000  HGB 12.1* 14.0 15.4  HCT 37.5* 42.6 46.1    PLT 274 311 396  INR 2.78* 2.23* 2.38*  APTT -- -- 30   Cardiac Panel (last 3 results)  Basename 08/08/11 0645  CKTOTAL 40981*  CKMB --  TROPONINI --  RELINDX --   A: Rhabdomyolysis  P:  -fluids stopped 8/4  INFECTIOUS  Lab 08/08/11 0643 08/07/11 0608 08/06/11 1604 08/06/11 1000  WBC 14.4* 19.7* -- 23.9*  PROCALCITON -- -- 32.64 --   Cultures: 8/2 bc x 2>> 8/1 uc>> Antibiotics: 8/4 levaquin>>  A: Presumed infection NSI P:   -per IM  ENDOCRINE  Lab 08/09/11 0821 08/08/11 2124 08/08/11 1620 08/08/11 1132 08/08/11 0727  GLUCAP 143* 140* 123* 148* 159*   A:  dm  P:   -ssi  NEUROLOGIC  A:  AMS from presumed benzo's in renal failure P:   -dc sedation  BEST PRACTICE / DISPOSITION Level of Care:  ICU Primary Service:  [facility] Consultants:  Renal/PCCM Code Status:  DNR but rescinded 8/4 and OTT allowed  Diet:  npo DVT Px: on coumadin.  GI Px:  ppi Skin Integrity:   Social / Family:  Family at bedside  MINOR, Chrissie Noa, M.D. Pulmonary and Critical Care Medicine [facility] Pager: [phone]  08/09/2011, 11:42 AM  This is all medication related, anticipate will improve with time.  Narcan will be attempted.  ABG with a pH of 7.19 but patient is more arousable now.  Concern is that if we intubate the patient is unlikely to come of the vent.  Will attempt a narcan drip if ineffective then will intubate.  CC time 45 min.  Patient seen and examined, agree with above note.  I dictated the care and orders written for this patient under my direction.  Koren Bound, M.D. [phone]

## 2011-08-09 NOTE — Progress Notes (Addendum)
Assessment/Plan  1. Sepsis -improved Urine cultures and blood cultures negative Congestion and cough, raising concern for bronchitis vs early pneumonia as source although CXR was normal on 8/1, repeat 2 view CXR 8/3 with peribronchial thickening only Dced IV Vancomycin and Zosyn 8/3, transitioned to levaquin 8/3,   Repeat Lactic acid normalized   2. Toxic metabolic encephalopathy  Secondary to sepsis had completely resolved by Day 1 of admission Worsened today(8/4) due to CO2 narcosis, meds Flumazenil given x1 today  Keep NPO till mentation improves  3. Acute Hypercarbic respiratory failure With acute change in mental status Suspect multifactorial, drug induced from decreased renal clearing(worsening GFR) of sedatives/Benzos/Gebapentin( was getting 900mg  TID) and OSA Also check STAT CXR Transfer to ICU BIPAP per RT Repeat ABG in 3-4 hours All sedating home meds stopped   4. ARF: ATN secondary to Rhabdomyolysis/ Sepsis   worsening creatinine,  but urine output is good, (2300 in last 24H)  simvastatin discontinued renal USG normal Vanc level-random was ok,  continue IVF, I/Os,   Repeat CK in am Greatly appreciate Dr.Schertz's consult Hopefully kidneys will improve especially since urine output has   5. Lactic acidosis: on admission, resolved in 24H  6. H/o DVT: continue coumadin per pharmacy  7. PVD S/P Bilateral AKA   8. DM: hold amaryl, SSI   Code status: full code, discussed with wife, she is agreeable with mechanical ventilation if needed temporarily   Laurena Valko  [facility]  Pager: [phone]  08/09/2011,     Subjective: Significant change in mentation today, lethargic, denies any chest pain/SoB  Objective: Vital signs in last 24 hours: Temp:  [98 F (36.7 C)-98.6 F (37 C)] 98.5 F (36.9 C) (08/04 0839) Pulse Rate:  [77-101] 100  (08/04 0839) Resp:  [18-20] 20  (08/04 0839) BP: (129-171)/(57-78) 157/78 mmHg (08/04 0839) SpO2:  [90 %-96 %] 91 %  (08/04 0839) Weight change:  Last BM Date: 08/09/11  Intake/Output from previous day: 08/03 0701 - 08/04 0700 In: 720 [P.O.:720] Out: 2325 [Urine:2325] Total I/O In: 10 [I.V.:10] Out: -    Physical Exam: Lethargic, arousible, answers questions when aroused, in no acute distress, slight flushing of the face  HEENT : Pupils equal reactive to light, oral mucosa moist and pink  CVS: S1S2/RRR, no m/r/g  Lungs: poor air movement bilaterally, coarse BS  Abd: soft, obese, a large vertical surgical scar, multiple small ventral abdominal hernia is without evidence of strangulation  No flank tenderness  Ext: bilateral AKA, no evidence of skin breakdown    Lab Results: Basic Metabolic Panel:  Basename 08/09/11 0910 08/08/11 0643  NA 136 137  K 4.9 3.6  CL 98 97  CO2 25 25  GLUCOSE 136* 152*  BUN 36* 31*  CREATININE 4.92* 4.20*  CALCIUM 8.4 7.9*  MG -- --  PHOS -- --   Liver Function Tests:  [facility] 08/07/11 0608  AST 378*  ALT 38  ALKPHOS 51  BILITOT 0.4  PROT 7.1  ALBUMIN 3.1*   No results found for this basename: LIPASE:2,AMYLASE:2 in the last 72 hours No results found for this basename: AMMONIA:2 in the last 72 hours CBC:  Basename 08/08/11 0643 08/07/11 0608  WBC 14.4* 19.7*  NEUTROABS -- --  HGB 12.1* 14.0  HCT 37.5* 42.6  MCV 96.6 96.6  PLT 274 311   Cardiac Enzymes:  Basename 08/08/11 0645  CKTOTAL 65784*  CKMB --  CKMBINDEX --  TROPONINI --   BNP: No results found for this basename: PROBNP:3 in  the last 72 hours D-Dimer: No results found for this basename: DDIMER:2 in the last 72 hours CBG:  Basename 08/09/11 0821 08/08/11 2124 08/08/11 1620 08/08/11 1132 08/08/11 0727 08/07/11 2222  GLUCAP 143* 140* 123* 148* 159* 142*   Hemoglobin A1C: No results found for this basename: HGBA1C in the last 72 hours Fasting Lipid Panel: No results found for this basename: CHOL,HDL,LDLCALC,TRIG,CHOLHDL,LDLDIRECT in the last 72 hours Thyroid Function  Tests: No results found for this basename: TSH,T4TOTAL,FREET4,T3FREE,THYROIDAB in the last 72 hours Anemia Panel: No results found for this basename: VITAMINB12,FOLATE,FERRITIN,TIBC,IRON,RETICCTPCT in the last 72 hours Coagulation:  Basename 08/08/11 0643 08/07/11 0608  LABPROT 29.8* 25.1*  INR 2.78* 2.23*   Urine Drug Screen: Drugs of Abuse  No results found for this basename: labopia,  cocainscrnur,  labbenz,  amphetmu,  thcu,  labbarb    Alcohol Level: No results found for this basename: ETH:2 in the last 72 hours Urinalysis:  Basename 08/06/11 1157  COLORURINE YELLOW  LABSPEC 1.021  PHURINE 6.0  GLUCOSEU NEGATIVE  HGBUR LARGE*  BILIRUBINUR NEGATIVE  KETONESUR NEGATIVE  PROTEINUR 100*  UROBILINOGEN 1.0  NITRITE NEGATIVE  LEUKOCYTESUR NEGATIVE    Recent Results (from the past 240 hour(s))  CULTURE, BLOOD (ROUTINE X 2)     Status: Normal (Preliminary result)   Collection Time   08/06/11 10:00 AM      Component Value Range Status Comment   Specimen Description BLOOD LEFT HAND   Final    Special Requests BOTTLES DRAWN AEROBIC AND ANAEROBIC [facility] EACH   Final    Culture  Setup Time 08/06/2011 12:54   Final    Culture     Final    Value:        BLOOD CULTURE RECEIVED NO GROWTH TO DATE CULTURE WILL BE HELD FOR 5 DAYS BEFORE ISSUING A FINAL NEGATIVE REPORT   Report Status PENDING   Incomplete   CULTURE, BLOOD (ROUTINE X 2)     Status: Normal (Preliminary result)   Collection Time   08/06/11 11:32 AM      Component Value Range Status Comment   Specimen Description BLOOD LEFT ARM   Final    Special Requests BOTTLES DRAWN AEROBIC AND ANAEROBIC 3CC EACH   Final    Culture  Setup Time 08/06/2011 23:11   Final    Culture     Final    Value:        BLOOD CULTURE RECEIVED NO GROWTH TO DATE CULTURE WILL BE HELD FOR 5 DAYS BEFORE ISSUING A FINAL NEGATIVE REPORT   Report Status PENDING   Incomplete   URINE CULTURE     Status: Normal   Collection Time   08/06/11 11:57 AM      Component  Value Range Status Comment   Specimen Description URINE, CATHETERIZED   Final    Special Requests NONE   Final    Culture  Setup Time 08/06/2011 19:58   Final    Colony Count NO GROWTH   Final    Culture NO GROWTH   Final    Report Status 08/07/2011 FINAL   Final   MRSA PCR SCREENING     Status: Normal   Collection Time   08/06/11  3:45 PM      Component Value Range Status Comment   MRSA by PCR NEGATIVE  NEGATIVE Final     Studies/Results: Dg Chest 2 View  08/08/2011  *RADIOLOGY REPORT*  Clinical Data: Suspect pneumonia.  CHEST - 2 VIEW  Comparison: 08/06/2011  Findings: Heart size appears normal.  Chronic bilateral peribronchial thickening is again noted and appears unchanged from previous exam.  No pleural effusion or interstitial edema.  No airspace consolidation.  IMPRESSION:  1.  Stable chronic bilateral peribronchial thickening.  Original Report Authenticated By: Rosealee Albee, M.D.   US Renal Port  08/07/2011  *RADIOLOGY REPORT*  Clinical Data: History of acute renal failure.  History of diabetes.  RENAL/URINARY TRACT ULTRASOUND COMPLETE  Comparison:  None.  Findings:  Right Kidney:  Right renal length is 13.4 cm.  Left Kidney:  Left renal length is 13.8 cm.  Examination of each kidney shows no evidence of hydronephrosis, solid or cystic mass, calculus, parenchymal loss, or parenchymal textural abnormality.  Bladder:  Bladder is decompressed by Foley catheter.  IMPRESSION: No renal abnormalities are evident by ultrasound.  Original Report Authenticated By: Crawford Givens, M.D.    Medications: Scheduled Meds:    . insulin aspart  0-9 Units Subcutaneous TID WC  . levofloxacin  500 mg Oral Q48H  . pantoprazole  40 mg Oral Q1200  . sodium chloride  3 mL Intravenous Q12H  . warfarin  1 mg Oral ONCE-1800  . warfarin  2 mg Oral ONCE-1800  . Warfarin - Pharmacist Dosing Inpatient   Does not apply q1800  . DISCONTD: FLUoxetine  10 mg Oral Daily  . DISCONTD: gabapentin  100 mg Oral TID  .  DISCONTD: gabapentin  900 mg Oral TID  . DISCONTD: imipramine  20 mg Oral QHS  . DISCONTD: simvastatin  40 mg Oral q1800   Continuous Infusions:    . sodium chloride 150 mL/hr at 08/08/11 2058   PRN Meds:.acetaminophen, acetaminophen, HYDROmorphone (DILAUDID) injection, ondansetron (ZOFRAN) IV, ondansetron, DISCONTD: clonazePAM, DISCONTD: diazepam     LOS: 3 days   Dashiell Franchino [facility] Pager: [phone] 08/09/2011, 10:32 AM

## 2011-08-09 NOTE — Progress Notes (Signed)
Subjective:   Objective Vital signs in last 24 hours: Filed Vitals:   08/08/11 2200 08/09/11 0544 08/09/11 0639 08/09/11 0839  BP: 154/67  171/73 157/78  Pulse: 82 101  100  Temp: 98.6 F (37 C) 98.1 F (36.7 C)  98.5 F (36.9 C)  TempSrc: Oral Oral  Axillary  Resp: 18 18  20   Height:      Weight:      SpO2: 94% 90%  91%   Weight change:   Intake/Output Summary (Last 24 hours) at 08/09/11 1132 Last data filed at 08/09/11 0956  Gross per 24 hour  Intake    490 ml  Output   2025 ml  Net  -1535 ml   Labs: Basic Metabolic Panel:  Lab 08/09/11 1914 08/08/11 0643 08/07/11 0828 08/07/11 0608 08/06/11 1000  NA 136 137 138 135 137  K 4.9 3.6 3.2* 3.2* 3.4*  CL 98 97 95* 92* 91*  CO2 25 25 30  33* 25  GLUCOSE 136* 152* 149* 148* 285*  BUN 36* 31* 23 22 12   CREATININE 4.92* 4.20* 2.68* 2.58* 0.95  ALB -- -- -- -- --  CALCIUM 8.4 7.9* 8.6 8.7 9.5  PHOS -- -- -- -- --   Liver Function Tests:  Lab 08/07/11 0608 08/06/11 1000  AST 378* 19  ALT 38 13  ALKPHOS 51 65  BILITOT 0.4 0.4  PROT 7.1 8.1  ALBUMIN 3.1* 3.8   No results found for this basename: LIPASE:3,AMYLASE:3 in the last 168 hours No results found for this basename: AMMONIA:3 in the last 168 hours CBC:  Lab 08/08/11 0643 08/07/11 0608 08/06/11 1000  WBC 14.4* 19.7* 23.9*  NEUTROABS -- -- 20.9*  HGB 12.1* 14.0 15.4  HCT 37.5* 42.6 46.1  MCV 96.6 96.6 96.8  PLT 274 311 396   PT/INR: @labrcntip (inr:5) Cardiac Enzymes:  Lab 08/08/11 0645  CKTOTAL 78295*  CKMB --  CKMBINDEX --  TROPONINI --   CBG:  Lab 08/09/11 0821 08/08/11 2124 08/08/11 1620 08/08/11 1132 08/08/11 0727  GLUCAP 143* 140* 123* 148* 159*    Iron Studies: No results found for this basename: IRON:30,TIBC:30,TRANSFERRIN:30,FERRITIN:30 in the last 168 hours  Physical Exam:  Blood pressure 157/78, pulse 100, temperature 98.5 F (36.9 C), temperature source Axillary, resp. rate 20, height 4' (1.219 m), weight 118.6 kg (261 lb 7.5 oz),  SpO2 91.00%.  Physical Exam: Blood pressure 131/47, pulse 93, temperature 98.1 F (36.7 C), temperature source Oral, resp. rate 16, height 4' (1.219 m), weight 118.6 kg (261 lb 7.5 oz), SpO2 96.00%.  Gen: alert, obese, NAD Skin: no rash, cyanosis  Neck: no JVD, no bruits or LAN  Chest: clear bilat Heart: regular, no rub or gallop, no murmur  Abdomen: soft, obese, +BS hyperactive  Ext: bilat AKA no edema or ulceration  Neuro: alert, Ox3, no focal deficit   UA > 0-2wbc, 3-6rbc, large bld UNa - 40,  UCreat- 32.4 CXR- bronchial thickening CPK- 13,300 yest   Impression/Plan  1. AKI due to ATN from rhabdomyolysis- creat up, but not a lot. Suspect trying to improve. Nonoliguric.   2. AMS- prob medication induced, valium and/or neurontin. Rec'd romazicon w good response. Also put on Bipap for CO2 retention.  3. Pulm edema - cxr this am is wet, have stopped IVF"s and started IV lasix.   4. Fever / ^ WBC- on empiric abx with levaquin now. Vanc/zosyn stopped. UCx neg.   5. DM 2 6. Bilat AKA 7. Obesity Mark Moselle  MD [facility] [phone] pgr    [phone] cell 08/09/2011, 11:32 AM

## 2011-08-09 NOTE — Progress Notes (Signed)
ANTICOAGULATION CONSULT NOTE - Follow up  Pharmacy Consult for Coumadin Indication: VTE treatment  Allergies  Allergen Reactions  . Morphine And Related Other (See Comments)    "mean"   Patient Measurements: Height: 4' (121.9 cm) (per nurse) Weight: 261 lb 7.5 oz (118.6 kg) IBW/kg (Calculated) : 22.4   Vital Signs: Temp: 98.5 F (36.9 C) (08/04 0839) Temp src: Axillary (08/04 0839) BP: 157/78 mmHg (08/04 0839) Pulse Rate: 100  (08/04 0839)  Labs:  Alvira Philips 08/09/11 0910 08/08/11 0645 08/08/11 1610 08/07/11 0828 08/07/11 0608  HGB -- -- 12.1* -- 14.0  HCT -- -- 37.5* -- 42.6  PLT -- -- 274 -- 311  APTT -- -- -- -- --  LABPROT -- -- 29.8* -- 25.1*  INR -- -- 2.78* -- 2.23*  HEPARINUNFRC -- -- -- -- --  CREATININE 4.92* -- 4.20* 2.68* --  CKTOTAL -- 13318* -- -- --  CKMB -- -- -- -- --  TROPONINI -- -- -- -- --   Estimated Creatinine Clearance: 11.3 ml/min (by C-G formula based on Cr of 4.92).  Medical History: Past Medical History  Diagnosis Date  . Diabetes mellitus   . Hypertension   . GERD (gastroesophageal reflux disease)   . Anxiety   . Depression   . Peripheral vascular disease   . Neuropathy    Assessment:  67 yoM presents to ED with AMS, sepsis (appears resolved)   On chronic Coumadin 4mg /day for hx DVTs. Obese with bilateral AKAs.       Levaquin from 8/3 can increase protime, likely see effect in 3-4 days.  Hgb, plts decreased but WNL.  INR increased 8/3, 2mg  Warfarin given.  Unable to obtain INR for this am 8/4, hesitate to hold Coumadin completely; will give a 1mg  Coumadin dose today, hope to obtain INR tomorrow.  Goal of Therapy:  INR 2-3 Monitor platelets by anticoagulation protocol: Yes   Plan:  Reduce Warfarin dose to 1mg  today. F/u PT/INR daily.  Otho Bellows PharmD Pager [phone] . 08/09/2011,10:09 AM.

## 2011-08-09 NOTE — Progress Notes (Signed)
Prolongned discussion with family and patient. They do no want intubation or dialysis.  Discussion with Dr. Jomarie Longs to confirm no intubation. Hemodialysis Catheter insertion without intubation is not feasible, therefore will not be done. Plan is keep on narcan drip and NIMVS through the night, if he declines then no further aggressive interventions will instituted and goal of care will become comfort.  Note greater than 45 minutes was spent with multiple family members who are in accord with these decisions.  Brett Canales Minor ACNP was also involved in these discussions.   Alyson Reedy, M.D. [facility] Pulmonary/Critical Care Medicine. Pager: [phone]. After hours pager: [phone].

## 2011-08-10 ENCOUNTER — Inpatient Hospital Stay (HOSPITAL_COMMUNITY): Payer: Medicare Other

## 2011-08-10 DIAGNOSIS — E872 Acidosis: Secondary | ICD-10-CM

## 2011-08-10 DIAGNOSIS — A419 Sepsis, unspecified organism: Secondary | ICD-10-CM

## 2011-08-10 DIAGNOSIS — N179 Acute kidney failure, unspecified: Secondary | ICD-10-CM

## 2011-08-10 LAB — BLOOD GAS, ARTERIAL
Acid-Base Excess: 2 mmol/L (ref 0.0–2.0)
Acid-base deficit: 0.7 mmol/L (ref 0.0–2.0)
Bicarbonate: 28.2 mEq/L — ABNORMAL HIGH (ref 20.0–24.0)
Bicarbonate: 29.4 mEq/L — ABNORMAL HIGH (ref 20.0–24.0)
Delivery systems: POSITIVE
Delivery systems: POSITIVE
Drawn by: 308601
Drawn by: 129801
Expiratory PAP: 10
Expiratory PAP: 10
FIO2: 0.4 %
FIO2: 0.4 %
Inspiratory PAP: 20
Inspiratory PAP: 20
Mode: POSITIVE
O2 Saturation: 98.8 %
O2 Saturation: 97.7 %
Patient temperature: 98.6
Patient temperature: 98.6
TCO2: 26.3 mmol/L (ref 0–100)
TCO2: 26.6 mmol/L (ref 0–100)
pCO2 arterial: 70.3 mmHg (ref 35.0–45.0)
pCO2 arterial: 61 mmHg (ref 35.0–45.0)
pH, Arterial: 7.227 — ABNORMAL LOW (ref 7.350–7.450)
pH, Arterial: 7.304 — ABNORMAL LOW (ref 7.350–7.450)
pO2, Arterial: 181 mmHg — ABNORMAL HIGH (ref 80.0–100.0)
pO2, Arterial: 106 mmHg — ABNORMAL HIGH (ref 80.0–100.0)

## 2011-08-10 LAB — BASIC METABOLIC PANEL
BUN: 40 mg/dL — ABNORMAL HIGH (ref 6–23)
CO2: 30 mEq/L (ref 19–32)
Calcium: 8.5 mg/dL (ref 8.4–10.5)
Chloride: 96 mEq/L (ref 96–112)
Creatinine, Ser: 5.61 mg/dL — ABNORMAL HIGH (ref 0.50–1.35)
GFR calc Af Amer: 10 mL/min — ABNORMAL LOW (ref >90)
GFR calc non Af Amer: 9 mL/min — ABNORMAL LOW (ref >90)
Glucose, Bld: 137 mg/dL — ABNORMAL HIGH (ref 70–99)
Potassium: 4.4 mEq/L (ref 3.5–5.1)
Sodium: 137 mEq/L (ref 135–145)

## 2011-08-10 LAB — GLUCOSE, CAPILLARY
Glucose-Capillary: 140 mg/dL — ABNORMAL HIGH (ref 70–99)
Glucose-Capillary: 99 mg/dL (ref 70–99)
Glucose-Capillary: 99 mg/dL (ref 70–99)
Glucose-Capillary: 117 mg/dL — ABNORMAL HIGH (ref 70–99)

## 2011-08-10 LAB — PHOSPHORUS: Phosphorus: 7 mg/dL — ABNORMAL HIGH (ref 2.3–4.6)

## 2011-08-10 LAB — CBC
HCT: 39.3 % (ref 39.0–52.0)
Hemoglobin: 12.7 g/dL — ABNORMAL LOW (ref 13.0–17.0)
MCH: 31.7 pg (ref 26.0–34.0)
MCHC: 32.3 g/dL (ref 30.0–36.0)
MCV: 98 fL (ref 78.0–100.0)
Platelets: 291 10*3/uL (ref 150–400)
RBC: 4.01 MIL/uL — ABNORMAL LOW (ref 4.22–5.81)
RDW: 15.4 % (ref 11.5–15.5)
WBC: 12.3 10*3/uL — ABNORMAL HIGH (ref 4.0–10.5)

## 2011-08-10 LAB — CK: Total CK: 8428 U/L — ABNORMAL HIGH (ref 7–232)

## 2011-08-10 LAB — PROTIME-INR
INR: 3.12 — ABNORMAL HIGH (ref 0.00–1.49)
Prothrombin Time: 32.6 seconds — ABNORMAL HIGH (ref 11.6–15.2)

## 2011-08-10 LAB — MAGNESIUM: Magnesium: 2.2 mg/dL (ref 1.5–2.5)

## 2011-08-10 MED ORDER — BIOTENE DRY MOUTH MT LIQD
15.0000 mL | Freq: Two times a day (BID) | OROMUCOSAL | Status: DC
  Administered 2011-08-10 – 2011-08-15 (×10): 15 mL via OROMUCOSAL

## 2011-08-10 MED ORDER — CHLORHEXIDINE GLUCONATE 0.12 % MT SOLN
15.0000 mL | Freq: Two times a day (BID) | OROMUCOSAL | Status: DC
  Administered 2011-08-10 – 2011-08-15 (×7): 15 mL via OROMUCOSAL
  Filled 2011-08-10 (×10): qty 15

## 2011-08-10 MED ORDER — VITAMINS A & D EX OINT
TOPICAL_OINTMENT | CUTANEOUS | Status: AC
  Administered 2011-08-10: 5
  Filled 2011-08-10: qty 5

## 2011-08-10 MED ORDER — INSULIN ASPART 100 UNIT/ML ~~LOC~~ SOLN
0.0000 [IU] | SUBCUTANEOUS | Status: DC
  Administered 2011-08-10: 2 [IU] via SUBCUTANEOUS

## 2011-08-10 NOTE — Progress Notes (Signed)
Placed pt. On bipap NIV/PC. Pt. Is currently resting and tolerating well at this time.

## 2011-08-10 NOTE — Progress Notes (Signed)
Assessment/Plan  1. Sepsis -improved Urine cultures and blood cultures negative Congestion and cough, raising concern for bronchitis vs early pneumonia as source although CXR was normal on 8/1, repeat 2 view CXR 8/3 with peribronchial thickening only, CXR 8/4 w/ RUL consolidation Dced IV Vancomycin and Zosyn 8/3, transitioned to levaquin 8/3,   Repeat Lactic acid normalized   2. Toxic metabolic encephalopathy  Secondary to sepsis had completely resolved by Day 1 of admission Worsened today(8/4) due to CO2 narcosis, meds Flumazenil given x1 8/4  Keep NPO till mentation improves DC Narcan drip  3. Acute Hypercarbic respiratory failure With acute change in mental status 8/4 With improvement since last night, more alert and interactive per family Suspect multifactorial, drug induced from decreased renal clearing(worsening GFR) of sedatives/Benzos/Gebapentin, Pulmonary edema and OSA Repeat CXR w/ mild improvement in pulmonary edema Continue IV lasix per Renal Continue BIPAP per RT Repeat ABG in 3-4 hours All sedating home meds stopped PCCM consult yesterday noted, DNI DC Narcan drip, did not get any narcotics recently   4. ARF: ATN secondary to Rhabdomyolysis/ Sepsis  Creatinine plateaued,  but urine output is robust, 4500 in 24H, net 3.3L negative CK improving Simvastatin discontinued renal USG normal Vanc level-random was ok,  I/Os,   Repeat CK in am IVF Dced yesterday am, currently on diuretics   5. Lactic acidosis: on admission, resolved in 24H  6. H/o DVT: continue coumadin per pharmacy  7. PVD S/P Bilateral AKA   8. DM: hold amaryl, SSI changed to Q4  Code status: was changed to DNR yesterday after discussion between PCCM and family, They do not want Intubation and hemodialysis at this point, Will discuss with Renal again   [facility]  [facility]  Pager: [phone]  08/09/2011,     Subjective: Didn't sleep at all last pm, sleeping since 3am on BIPAP,  per Son was more alert and appropriate by last night  Objective: Vital signs in last 24 hours: Temp:  [97.7 F (36.5 C)-99.9 F (37.7 C)] 98.2 F (36.8 C) (08/05 0700) Pulse Rate:  [68-100] 78  (08/05 0700) Resp:  [11-24] 23  (08/05 0700) BP: (127-164)/(45-83) 157/72 mmHg (08/05 0700) SpO2:  [91 %-98 %] 96 % (08/05 0700) FiO2 (%):  [30 %-50 %] 40 % (08/05 0615) Weight:  [120.8 kg (266 lb 5.1 oz)-122.6 kg (270 lb 4.5 oz)] 120.8 kg (266 lb 5.1 oz) (08/05 0400) Weight change:  Last BM Date: 08/09/11  Intake/Output from previous day: 08/04 0701 - 08/05 0700 In: 1472 [I.V.:1010; IV Piggyback:462] Out: 4500 [Urine:4500]     Physical Exam: Sleeping, tired, with BIPAP mask on, no acute distress, slight flushing of the face  HEENT : Pupils equal reactive to light, oral mucosa moist and pink  CVS: S1S2/RRR, no m/r/g  Lungs: poor air movement bilaterally, coarse BS  Abd: soft, obese, a large vertical surgical scar, multiple small ventral abdominal hernia is without evidence of strangulation  No flank tenderness  Ext: bilateral AKA, no evidence of skin breakdown    Lab Results: Basic Metabolic Panel:  Basename 08/10/11 0407 08/09/11 1759  NA 137 136  K 4.4 3.9  CL 96 97  CO2 30 26  GLUCOSE 137* 123*  BUN 40* 38*  CREATININE 5.61* 5.30*  CALCIUM 8.5 8.2*  MG 2.2 --  PHOS 7.0* --   Liver Function Tests: No results found for this basename: AST:2,ALT:2,ALKPHOS:2,BILITOT:2,PROT:2,ALBUMIN:2 in the last 72 hours No results found for this basename: LIPASE:2,AMYLASE:2 in the last 72 hours No results  found for this basename: AMMONIA:2 in the last 72 hours CBC:  Basename 08/10/11 0407 08/08/11 0643  WBC 12.3* 14.4*  NEUTROABS -- --  HGB 12.7* 12.1*  HCT 39.3 37.5*  MCV 98.0 96.6  PLT 291 274   Cardiac Enzymes:  Basename 08/10/11 0407 08/08/11 0645  CKTOTAL 8428* 13318*  CKMB -- --  CKMBINDEX -- --  TROPONINI -- --   BNP:  Basename 08/09/11 0910  PROBNP 1971.0*    D-Dimer: No results found for this basename: DDIMER:2 in the last 72 hours CBG:  Basename 08/09/11 2128 08/09/11 1654 08/09/11 1159 08/09/11 0821 08/08/11 2124 08/08/11 1620  GLUCAP 120* 129* 123* 143* 140* 123*   Hemoglobin A1C: No results found for this basename: HGBA1C in the last 72 hours Fasting Lipid Panel: No results found for this basename: CHOL,HDL,LDLCALC,TRIG,CHOLHDL,LDLDIRECT in the last 72 hours Thyroid Function Tests: No results found for this basename: TSH,T4TOTAL,FREET4,T3FREE,THYROIDAB in the last 72 hours Anemia Panel: No results found for this basename: VITAMINB12,FOLATE,FERRITIN,TIBC,IRON,RETICCTPCT in the last 72 hours Coagulation:  Basename 08/10/11 0407 08/08/11 0643  LABPROT 32.6* 29.8*  INR 3.12* 2.78*   Urine Drug Screen: Drugs of Abuse  No results found for this basename: labopia,  cocainscrnur,  labbenz,  amphetmu,  thcu,  labbarb    Alcohol Level: No results found for this basename: ETH:2 in the last 72 hours Urinalysis: No results found for this basename: COLORURINE:2,APPERANCEUR:2,LABSPEC:2,PHURINE:2,GLUCOSEU:2,HGBUR:2,BILIRUBINUR:2,KETONESUR:2,PROTEINUR:2,UROBILINOGEN:2,NITRITE:2,LEUKOCYTESUR:2 in the last 72 hours  Recent Results (from the past 240 hour(s))  CULTURE, BLOOD (ROUTINE X 2)     Status: Normal (Preliminary result)   Collection Time   08/06/11 10:00 AM      Component Value Range Status Comment   Specimen Description BLOOD LEFT HAND   Final    Special Requests BOTTLES DRAWN AEROBIC AND ANAEROBIC [facility] EACH   Final    Culture  Setup Time 08/06/2011 12:54   Final    Culture     Final    Value:        BLOOD CULTURE RECEIVED NO GROWTH TO DATE CULTURE WILL BE HELD FOR 5 DAYS BEFORE ISSUING A FINAL NEGATIVE REPORT   Report Status PENDING   Incomplete   CULTURE, BLOOD (ROUTINE X 2)     Status: Normal (Preliminary result)   Collection Time   08/06/11 11:32 AM      Component Value Range Status Comment   Specimen Description BLOOD LEFT ARM    Final    Special Requests BOTTLES DRAWN AEROBIC AND ANAEROBIC 3CC EACH   Final    Culture  Setup Time 08/06/2011 23:11   Final    Culture     Final    Value:        BLOOD CULTURE RECEIVED NO GROWTH TO DATE CULTURE WILL BE HELD FOR 5 DAYS BEFORE ISSUING A FINAL NEGATIVE REPORT   Report Status PENDING   Incomplete   URINE CULTURE     Status: Normal   Collection Time   08/06/11 11:57 AM      Component Value Range Status Comment   Specimen Description URINE, CATHETERIZED   Final    Special Requests NONE   Final    Culture  Setup Time 08/06/2011 19:58   Final    Colony Count NO GROWTH   Final    Culture NO GROWTH   Final    Report Status 08/07/2011 FINAL   Final   MRSA PCR SCREENING     Status: Normal   Collection Time   08/06/11  3:45 PM      Component Value Range Status Comment   MRSA by PCR NEGATIVE  NEGATIVE Final     Studies/Results: Dg Chest 2 View  08/08/2011  *RADIOLOGY REPORT*  Clinical Data: Suspect pneumonia.  CHEST - 2 VIEW  Comparison: 08/06/2011  Findings: Heart size appears normal.  Chronic bilateral peribronchial thickening is again noted and appears unchanged from previous exam.  No pleural effusion or interstitial edema.  No airspace consolidation.  IMPRESSION:  1.  Stable chronic bilateral peribronchial thickening.  Original Report Authenticated By: Rosealee Albee, M.D.   Dg Chest Port 1 View  08/10/2011  *RADIOLOGY REPORT*  Clinical Data: CHF.  Pulmonary edema.  PORTABLE CHEST - 1 VIEW  Comparison: 08/09/2011.  Findings: Slightly improved airspace disease in the upper lobes. Persistent bilateral basilar airspace disease and atelectasis. Cardiomegaly.  There is improved aeration of the right upper lobe, likely representing resolution of mucous plugging.  IMPRESSION: Mild improvement in pulmonary aeration/edema with mild to moderate CHF.  Original Report Authenticated By: Andreas Newport, M.D.   Dg Chest Port 1 View  08/09/2011  *RADIOLOGY REPORT*  Clinical Data: Respiratory  distress.  PORTABLE CHEST - 1 VIEW  Comparison: 08/08/2011  Findings: Heart size appears enlarged.  Lung volumes are low and there is diffuse pulmonary edema.  Focal area of consolidation is noted in the right upper lobe.  IMPRESSION:  1.  Pulmonary edema 2.  Right upper lobe consolidation.  Original Report Authenticated By: Rosealee Albee, M.D.    Medications: Scheduled Meds:    . antiseptic oral rinse  15 mL Mouth Rinse q12n4p  . chlorhexidine  15 mL Mouth Rinse BID  . flumazenil  0.5 mg Intravenous Once  . furosemide  160 mg Intravenous Q12H  . insulin aspart  0-9 Units Subcutaneous TID WC  . ipratropium  0.5 mg Nebulization Q6H  . levalbuterol  0.63 mg Nebulization Q6H  . levofloxacin (LEVAQUIN) IV  500 mg Intravenous Q48H  . naloxone (NARCAN) injection  0.2 mg Intravenous Once  . pantoprazole (PROTONIX) IV  40 mg Intravenous QHS  . sodium chloride  3 mL Intravenous Q12H  . vitamin A & D      . warfarin  1 mg Oral ONCE-1800  . Warfarin - Pharmacist Dosing Inpatient   Does not apply q1800  . DISCONTD: FLUoxetine  10 mg Oral Daily  . DISCONTD: gabapentin  100 mg Oral TID  . DISCONTD: imipramine  20 mg Oral QHS  . DISCONTD: levofloxacin (LEVAQUIN) IV  500 mg Intravenous Q48H  . DISCONTD: levofloxacin  500 mg Oral Q48H  . DISCONTD: pantoprazole  40 mg Oral Q1200   Continuous Infusions:    . naloxone 0.25 mg/hr (08/10/11 0259)  . DISCONTD: sodium chloride 150 mL/hr at 08/08/11 2058   PRN Meds:.acetaminophen, acetaminophen, ondansetron (ZOFRAN) IV, ondansetron, DISCONTD: clonazePAM, DISCONTD: diazepam, DISCONTD:  HYDROmorphone (DILAUDID) injection     LOS: 4 days   Dyani Babel [facility] Pager: [phone] 08/10/2011, 7:37 AM

## 2011-08-10 NOTE — Progress Notes (Addendum)
ANTICOAGULATION CONSULT NOTE - Follow Up Consult  Pharmacy Consult for Coumadin Indication: h/o DVTs  Allergies  Allergen Reactions  . Morphine And Related Other (See Comments)    "mean"    Patient Measurements: Height: 4' (121.9 cm) Weight: 266 lb 5.1 oz (120.8 kg) IBW/kg (Calculated) : 22.4   Vital Signs: Temp: 98.2 F (36.8 C) (08/05 0800) Temp src: Core (Comment) (08/05 0400) BP: 102/50 mmHg (08/05 0800) Pulse Rate: 59  (08/05 0800)  Labs:  Basename 08/10/11 0407 08/09/11 1759 08/09/11 0910 08/08/11 0645 08/08/11 0643  HGB 12.7* -- -- -- 12.1*  HCT 39.3 -- -- -- 37.5*  PLT 291 -- -- -- 274  APTT -- -- -- -- --  LABPROT 32.6* -- -- -- 29.8*  INR 3.12* -- -- -- 2.78*  HEPARINUNFRC -- -- -- -- --  CREATININE 5.61* 5.30* 4.92* -- --  CKTOTAL 8428* -- -- 16109* --  CKMB -- -- -- -- --  TROPONINI -- -- -- -- --    Estimated Creatinine Clearance: 10.1 ml/min (by C-G formula based on Cr of 5.61).  Assessment:  74 YOM with h/o DVTs on Coumadin PTA.  PTA dose was 4 mg/day (confirmed with wife).    INR increased 8/3, coumadin dose decreased.  Unable to obtain INR on 8/4, hesitated to hold coumadin so 1mg  was given.  INR supratherapeutic today at 3.12.    Per RN, pt with small scratch on face and bleeding noted from this area.    Goal of Therapy:  INR 2-3 Monitor platelets by anticoagulation protocol: Yes   Plan:   No coumadin tonight.   Daily PT/INR, pharmacy will f/u  Geoffry Paradise Thi 08/10/2011,8:50 AM

## 2011-08-10 NOTE — Progress Notes (Signed)
Subjective:  Still  Requiring bipap most of the time, but O2 sats seem better.  He has responded very well to lasix , over 4 liters of output. Creatinine stilll has not peaked but rate of rise has decreased some  Objective Vital signs in last 24 hours: Filed Vitals:   08/10/11 0800 08/10/11 0840 08/10/11 1000 08/10/11 1200  BP: 102/50  118/54 129/50  Pulse: 59  90 93  Temp: 98.2 F (36.8 C)  98.4 F (36.9 C) 98.8 F (37.1 C)  TempSrc:      Resp: 16  15 19   Height:      Weight:      SpO2: 97% 96% 96% 94%   Weight change:   Intake/Output Summary (Last 24 hours) at 08/10/11 1351 Last data filed at 08/10/11 1148  Gross per 24 hour  Intake 1624.5 ml  Output   4900 ml  Net -3275.5 ml   Labs: Basic Metabolic Panel:  Lab 08/10/11 3086 08/09/11 1759 08/09/11 0910 08/08/11 0643 08/07/11 0828 08/07/11 0608 08/06/11 1000  NA 137 136 136 137 138 135 137  K 4.4 3.9 4.9 3.6 3.2* 3.2* 3.4*  CL 96 97 98 97 95* 92* 91*  CO2 30 26 25 25 30  33* 25  GLUCOSE 137* 123* 136* 152* 149* 148* 285*  BUN 40* 38* 36* 31* 23 22 12   CREATININE 5.61* 5.30* 4.92* 4.20* 2.68* 2.58* 0.95  ALB -- -- -- -- -- -- --  CALCIUM 8.5 8.2* 8.4 7.9* 8.6 8.7 9.5  PHOS 7.0* -- -- -- -- -- --   Liver Function Tests:  Lab 08/07/11 0608 08/06/11 1000  AST 378* 19  ALT 38 13  ALKPHOS 51 65  BILITOT 0.4 0.4  PROT 7.1 8.1  ALBUMIN 3.1* 3.8   No results found for this basename: LIPASE:3,AMYLASE:3 in the last 168 hours No results found for this basename: AMMONIA:3 in the last 168 hours CBC:  Lab 08/10/11 0407 08/08/11 0643 08/07/11 0608 08/06/11 1000  WBC 12.3* 14.4* 19.7* 23.9*  NEUTROABS -- -- -- 20.9*  HGB 12.7* 12.1* 14.0 15.4  HCT 39.3 37.5* 42.6 46.1  MCV 98.0 96.6 96.6 96.8  PLT 291 274 311 396   PT/INR: @labrcntip (inr:5) Cardiac Enzymes:  Lab 08/10/11 0407 08/08/11 0645  CKTOTAL 8428* 13318*  CKMB -- --  CKMBINDEX -- --  TROPONINI -- --   CBG:  Lab 08/10/11 1146 08/10/11 0757 08/09/11  2128 08/09/11 1654 08/09/11 1159  GLUCAP 99 140* 120* 129* 123*    Iron Studies: No results found for this basename: IRON:30,TIBC:30,TRANSFERRIN:30,FERRITIN:30 in the last 168 hours  Physical Exam:  Blood pressure 129/50, pulse 93, temperature 98.8 F (37.1 C), temperature source Core (Comment), resp. rate 19, height 4' (1.219 m), weight 120.8 kg (266 lb 5.1 oz), SpO2 94.00%.  Physical Exam: Blood pressure 131/47, pulse 93, temperature 98.1 F (36.7 C), temperature source Oral, resp. rate 16, height 4' (1.219 m), weight 118.6 kg (261 lb 7.5 oz), SpO2 96.00%.  Gen: alert, obese, NAD Skin: no rash, cyanosis  Neck: no JVD, no bruits or LAN  Chest: clear bilat Heart: regular, no rub or gallop, no murmur  Abdomen: soft, obese, +BS hyperactive  Ext: bilat AKA no edema or ulceration  Neuro: alert, Ox3, no focal deficit     CPK- down to 8400 today   Impression/Plan  1. AKI due to ATN from rhabdomyolysis- creat up, but not a lot. Suspect trying to improve. Nonoliguric.  Did respond very well to lasix, will  continue.  CK has improved.  Since renal function normal at baseline, hopeful for recovery. No indications for HD 2. AMS- prob medication induced, valium and/or neurontin. Rec'd romazicon w good response. Also put on Bipap for CO2 retention.  3. Pulm edema - cxr was wet, will continue lasix 4. Fever / ^ WBC- on empiric abx with levaquin now. Vanc/zosyn stopped. UCx neg.  Per CCM 5. DM 2- per primary 6. Bilat AKA 7. Obesity   Mark Russell A l 08/10/2011, 1:51 PM

## 2011-08-10 NOTE — Progress Notes (Signed)
PT has small facial scratch on right side near nose. Bleeds profusely if disturbed. Held pressure til clot formed.

## 2011-08-10 NOTE — Progress Notes (Signed)
Name: Mark Russell MRN: [id number] DOB: 03/03/1935    LOS: 4  Referring Provider: [facility]) Reason for Referral:  Hypercarbic resp failure.  PULMONARY / CRITICAL CARE MEDICINE  HPI:  76 yo former smoker with a plethora of health problems was found with AMS 8/1 and admitted to [facility]. Found to have rhabdo and acute renal failure. Treated with IVF and renal was consulted. 8/4 he was lethargic and abg revealed hypercarbic resp failure most likely exacerbated by renal failure and continuation of home pain and anxiety medication. Transferred to ICU and DNR temporarily suspended with short term intubation allowed.  NIMVS instituted and PCCM asked to assist in care.   Subjective States he feels a little better. Still speech slurred and weak.   Vital Signs: Temp:  [97.7 F (36.5 C)-99.9 F (37.7 C)] 98.2 F (36.8 C) (08/05 0800) Pulse Rate:  [59-98] 59  (08/05 0800) Resp:  [11-24] 16  (08/05 0800) BP: (102-164)/(45-83) 102/50 mmHg (08/05 0800) SpO2:  [92 %-98 %] 97 % (08/05 0800) FiO2 (%):  [30 %-50 %] 40 % (08/05 0800) Weight:  [120.8 kg (266 lb 5.1 oz)-122.6 kg (270 lb 4.5 oz)] 120.8 kg (266 lb 5.1 oz) (08/05 0400)  Intake/Output Summary (Last 24 hours) at 08/10/11 1156 Last data filed at 08/10/11 1148  Gross per 24 hour  Intake 1690.5 ml  Output   5450 ml  Net -3759.5 ml    Physical Examination: General:  MOWM, lethargic  Neuro:  Follows commands, Oriented. Drifts off to sleep quickly.  HEENT:  Neck large. Unable to assess JVD. + upper airway wheeze Cardiovascular:  hsd Lungs:Upper airway wheeze translocated over thorax. Decreased BS t/o. Occ scattered rhonchi.  Abdomen:  obses +bs Musculoskeletal:  Bilateral AKA Skin:  warm  Active Problems:  Hypoxia  Leukocytosis  Sepsis  PVD (peripheral vascular disease)  S/P AKA (above knee amputation) bilateral  DVT (deep venous thrombosis)  ARF (acute renal failure)  Acute respiratory failure  Hypoxemia  Respiratory  acidosis   ASSESSMENT AND PLAN  PULMONARY  Lab 08/10/11 0520 08/09/11 1335 08/09/11 1000 08/06/11 1030  PHART 7.227* 7.190* 7.158* 7.442  PCO2ART 70.3* 75.7* 76.4* 43.4  PO2ART 181.0* 79.0* 58.3* 67.3*  HCO3 28.2* 27.8* 26.0* 29.1*  O2SAT 98.8 94.3 87.6 93.6   Ventilator Settings: Vent Mode:  [-] PCV FiO2 (%):  [30 %-50 %] 40 % Set Rate:  [15 bmp] 15 bmp PEEP:  [10 cmH20] 10 cmH20 CXR:   Improved aeration, w/ persistent R>L basilar volume loss.  ETT:    A:  Hypercarbic resp failure exacerbated by renal failure and oversedation and OHS.  Gas exchange improved. Still atelectatic on CXR. Clearly has baseline OHS/OSA and prob chronic respiratory failure.  P. -NIMVS PRN -diuresis as per renal - may need to back off as S Cr rising -cont  Bd's -hold sedating meds   CARDIOVASCULAR  Lab 08/09/11 0910 08/07/11 0609 08/06/11 1605 08/06/11 1000  TROPONINI -- -- -- --  LATICACIDVEN -- 1.2 3.7* 8.2*  PROBNP 1971.0* -- -- --   ECG:  Aflutter Lines:  A: CAD/ AFib/flutter P:  -coumadin   RENAL  Lab 08/10/11 0407 08/09/11 1759 08/09/11 0910 08/08/11 0643 08/07/11 0828  NA 137 136 136 137 138  K 4.4 3.9 -- -- --  CL 96 97 98 97 95*  CO2 30 26 25 25 30   BUN 40* 38* 36* 31* 23  CREATININE 5.61* 5.30* 4.92* 4.20* 2.68*  CALCIUM 8.5 8.2* 8.4 7.9* 8.6  MG 2.2 -- -- -- --  PHOS 7.0* -- -- -- --   Intake/Output      08/04 0701 - 08/05 0700 08/05 0701 - 08/06 0700   P.O.     I.V. (mL/kg) 1010 (8.4) 62.5 (0.5)   IV Piggyback 462 100   Total Intake(mL/kg) 1472 (12.2) 162.5 (1.3)   Urine (mL/kg/hr) 4500 (1.6) 325   Total Output 4500 325   Net -3028 -162.5        Stool Occurrence  1 x    Foley:  8/4  A: ARF in setting of sepsis and rhabdo. Scr worse, but UOP is good. Hope that this represents a plateau  P:   -per renal  GASTROINTESTINAL  Lab 08/07/11 0608 08/06/11 1000  AST 378* 19  ALT 38 13  ALKPHOS 51 65  BILITOT 0.4 0.4  PROT 7.1 8.1  ALBUMIN 3.1* 3.8    A:   No acute issue P:   Supportive care  HEMATOLOGIC  Lab 08/10/11 0407 08/08/11 0643 08/07/11 0608 08/06/11 1000  HGB 12.7* 12.1* 14.0 15.4  HCT 39.3 37.5* 42.6 46.1  PLT 291 274 311 396  INR 3.12* 2.78* 2.23* 2.38*  APTT -- -- -- 30  A: mild anemia P: F/u cbc Trigger for x fusion <7  INFECTIOUS  Lab 08/10/11 0407 08/08/11 0643 08/07/11 0608 08/06/11 1604 08/06/11 1000  WBC 12.3* 14.4* 19.7* -- 23.9*  PROCALCITON -- -- -- 32.64 --   Cultures: 8/2 bc x 2>> 8/1 uc>>neg  Antibiotics: 8/4 levaquin>>  A: Presumed sepsis but source never identified.  P:   -per IM  ENDOCRINE  Lab 08/10/11 0757 08/09/11 2128 08/09/11 1654 08/09/11 1159 08/09/11 0821  GLUCAP 140* 120* 129* 123* 143*   A:  dm  P:   -ssi  NEUROLOGIC  A:  Acute encephalopathy due to narcs, benzo's in renal failure + hypercapnia P:   -avoid sedating meds.   BEST PRACTICE / DISPOSITION Level of Care:  ICU Primary Service:  [facility]:  Renal/PCCM Code Status:  DNR Diet:  npo DVT Px: on coumadin.  GI Px:  ppi Skin Integrity:   Social / Family:  Family at bedside. They are on the fence about short term HD.   BABCOCK,PETE, 08/10/2011, 9:18 AM   Attending Addendum:  I have seen the patient, discussed the issues, test results and plans with Kreg Shropshire, NP. I agree with the Assessment and Plans as ammended above.   Levy Pupa, MD, PhD 08/10/2011, 11:59 AM [facility] [phone] or if no answer [phone]    [phone]

## 2011-08-10 NOTE — Progress Notes (Signed)
Pt's wife insisted on shaving patient with disposable razor after I requested she use an electric razor or wait to shave the patient.

## 2011-08-11 ENCOUNTER — Inpatient Hospital Stay (HOSPITAL_COMMUNITY): Payer: Medicare Other

## 2011-08-11 LAB — GLUCOSE, CAPILLARY
Glucose-Capillary: 120 mg/dL — ABNORMAL HIGH (ref 70–99)
Glucose-Capillary: 131 mg/dL — ABNORMAL HIGH (ref 70–99)
Glucose-Capillary: 113 mg/dL — ABNORMAL HIGH (ref 70–99)
Glucose-Capillary: 156 mg/dL — ABNORMAL HIGH (ref 70–99)
Glucose-Capillary: 169 mg/dL — ABNORMAL HIGH (ref 70–99)
Glucose-Capillary: 162 mg/dL — ABNORMAL HIGH (ref 70–99)

## 2011-08-11 LAB — BASIC METABOLIC PANEL
BUN: 52 mg/dL — ABNORMAL HIGH (ref 6–23)
CO2: 32 mEq/L (ref 19–32)
Calcium: 8.7 mg/dL (ref 8.4–10.5)
Chloride: 96 mEq/L (ref 96–112)
Creatinine, Ser: 6.1 mg/dL — ABNORMAL HIGH (ref 0.50–1.35)
GFR calc Af Amer: 9 mL/min — ABNORMAL LOW (ref >90)
GFR calc non Af Amer: 8 mL/min — ABNORMAL LOW (ref >90)
Glucose, Bld: 130 mg/dL — ABNORMAL HIGH (ref 70–99)
Potassium: 3.6 mEq/L (ref 3.5–5.1)
Sodium: 142 mEq/L (ref 135–145)

## 2011-08-11 LAB — CBC
HCT: 41 % (ref 39.0–52.0)
Hemoglobin: 13.1 g/dL (ref 13.0–17.0)
MCH: 31.4 pg (ref 26.0–34.0)
MCHC: 32 g/dL (ref 30.0–36.0)
MCV: 98.3 fL (ref 78.0–100.0)
Platelets: 349 10*3/uL (ref 150–400)
RBC: 4.17 MIL/uL — ABNORMAL LOW (ref 4.22–5.81)
RDW: 15.5 % (ref 11.5–15.5)
WBC: 12.8 10*3/uL — ABNORMAL HIGH (ref 4.0–10.5)

## 2011-08-11 LAB — PROTIME-INR
INR: 2.82 — ABNORMAL HIGH (ref 0.00–1.49)
Prothrombin Time: 30.1 seconds — ABNORMAL HIGH (ref 11.6–15.2)

## 2011-08-11 LAB — CK: Total CK: 5644 U/L — ABNORMAL HIGH (ref 7–232)

## 2011-08-11 MED ORDER — LORAZEPAM 2 MG/ML IJ SOLN
INTRAMUSCULAR | Status: AC
  Administered 2011-08-11: 0.25 mg
  Filled 2011-08-11: qty 1

## 2011-08-11 MED ORDER — FUROSEMIDE 10 MG/ML IJ SOLN
80.0000 mg | Freq: Two times a day (BID) | INTRAMUSCULAR | Status: DC
  Administered 2011-08-11: 80 mg via INTRAVENOUS
  Filled 2011-08-11 (×3): qty 8

## 2011-08-11 MED ORDER — WARFARIN 0.5 MG HALF TABLET
0.5000 mg | ORAL_TABLET | Freq: Once | ORAL | Status: AC
  Administered 2011-08-11: 0.5 mg via ORAL
  Filled 2011-08-11: qty 1

## 2011-08-11 MED ORDER — INSULIN ASPART 100 UNIT/ML ~~LOC~~ SOLN
0.0000 [IU] | Freq: Three times a day (TID) | SUBCUTANEOUS | Status: DC
  Administered 2011-08-11 – 2011-08-12 (×3): 2 [IU] via SUBCUTANEOUS
  Administered 2011-08-12: 1 [IU] via SUBCUTANEOUS
  Administered 2011-08-12: 2 [IU] via SUBCUTANEOUS
  Administered 2011-08-13: 1 [IU] via SUBCUTANEOUS
  Administered 2011-08-13: 2 [IU] via SUBCUTANEOUS
  Administered 2011-08-13 – 2011-08-14 (×4): 1 [IU] via SUBCUTANEOUS
  Administered 2011-08-15: 2 [IU] via SUBCUTANEOUS

## 2011-08-11 MED ORDER — LORAZEPAM 2 MG/ML IJ SOLN
0.2500 mg | Freq: Once | INTRAMUSCULAR | Status: AC
  Administered 2011-08-11: 0.25 mg via INTRAVENOUS

## 2011-08-11 MED ORDER — POTASSIUM CHLORIDE CRYS ER 20 MEQ PO TBCR
40.0000 meq | EXTENDED_RELEASE_TABLET | Freq: Once | ORAL | Status: AC
  Administered 2011-08-11: 40 meq via ORAL
  Filled 2011-08-11: qty 1

## 2011-08-11 MED ORDER — LORAZEPAM 2 MG/ML IJ SOLN
0.5000 mg | Freq: Four times a day (QID) | INTRAMUSCULAR | Status: DC | PRN
  Administered 2011-08-12 (×2): 0.5 mg via INTRAVENOUS
  Filled 2011-08-11 (×2): qty 1

## 2011-08-11 MED ORDER — POTASSIUM CHLORIDE 10 MEQ/100ML IV SOLN
10.0000 meq | INTRAVENOUS | Status: DC
  Administered 2011-08-11: 10 meq via INTRAVENOUS
  Filled 2011-08-11: qty 100

## 2011-08-11 NOTE — Progress Notes (Signed)
Brief Nutrition Note  Reason: Change in status  Patient reported that his appetite and intake have been well. He reported he was eating well PTA. He denies any problems chewing or swallowing. PO intake documented 75% at meals on Dysphagia diet. Patient was without any nutrition questions or concerns. Patient not at nutrition risk at this time.   RD available for nutrition needs.   Iven Finn [facility] [phone]

## 2011-08-11 NOTE — Progress Notes (Signed)
CARE MANAGE MENT UTILIZATION REVIEW NOTE 08/11/2011     Patient:  Mark Russell, Mark Russell   Account Number:  [id number]  Documented by:  Bjorn Loser Julyana Woolverton   Per Ur Regulation Reviewed for med. necessity/level of care/duration of stay

## 2011-08-11 NOTE — Progress Notes (Signed)
Bipap poorly tolerated due to pt anxiety. Daughter expressed reluctance about dad getting any anti-anxiety meds, although she stated that he took  valium routinely at home. 0.25 mg ativan IV given to help pt relax, n/c O2 at 5L placed in mouth due to pt "mouth breathing". Rested more comfortably afterward on n/c O2. Breathing treatments and lasix iv given to increase oxygenation.

## 2011-08-11 NOTE — Progress Notes (Signed)
Subjective:  More alert, off bipap, wife is feeding him.  He is talking but still a little confused  He has responded very well to lasix  over 4 liters of output. Creatinine stilll has not peaked but rate of rise has decreased  Objective Vital signs in last 24 hours: Filed Vitals:   08/11/11 0900 08/11/11 0935 08/11/11 1030 08/11/11 1100  BP:   148/65   Pulse: 66  90 90  Temp: 98.6 F (37 C)  99.3 F (37.4 C) 99 F (37.2 C)  TempSrc:      Resp: 13  15 19   Height:      Weight:      SpO2: 94% 92% 95% 90%   Weight change:   Intake/Output Summary (Last 24 hours) at 08/11/11 1252 Last data filed at 08/11/11 1000  Gross per 24 hour  Intake    426 ml  Output   3850 ml  Net  -3424 ml   Labs: Basic Metabolic Panel:  Lab 08/11/11 1914 08/10/11 0407 08/09/11 1759 08/09/11 0910 08/08/11 0643 08/07/11 0828 08/07/11 0608  NA 142 137 136 136 137 138 135  K 3.6 4.4 3.9 4.9 3.6 3.2* 3.2*  CL 96 96 97 98 97 95* 92*  CO2 32 30 26 25 25 30  33*  GLUCOSE 130* 137* 123* 136* 152* 149* 148*  BUN 52* 40* 38* 36* 31* 23 22  CREATININE 6.10* 5.61* 5.30* 4.92* 4.20* 2.68* 2.58*  ALB -- -- -- -- -- -- --  CALCIUM 8.7 8.5 8.2* 8.4 7.9* 8.6 8.7  PHOS -- 7.0* -- -- -- -- --   Liver Function Tests:  Lab 08/07/11 0608 08/06/11 1000  AST 378* 19  ALT 38 13  ALKPHOS 51 65  BILITOT 0.4 0.4  PROT 7.1 8.1  ALBUMIN 3.1* 3.8   No results found for this basename: LIPASE:3,AMYLASE:3 in the last 168 hours No results found for this basename: AMMONIA:3 in the last 168 hours CBC:  Lab 08/11/11 0513 08/10/11 0407 08/08/11 0643 08/07/11 0608 08/06/11 1000  WBC 12.8* 12.3* 14.4* 19.7* --  NEUTROABS -- -- -- -- 20.9*  HGB 13.1 12.7* 12.1* 14.0 --  HCT 41.0 39.3 37.5* 42.6 --  MCV 98.3 98.0 96.6 96.6 --  PLT 349 291 274 311 --   PT/INR: @labrcntip (inr:5) Cardiac Enzymes:  Lab 08/11/11 0513 08/10/11 0407 08/08/11 0645  CKTOTAL 5644* 8428* 13318*  CKMB -- -- --  CKMBINDEX -- -- --  TROPONINI -- --  --   CBG:  Lab 08/11/11 0756 08/11/11 0428 08/10/11 2315 08/10/11 1919 08/10/11 1550  GLUCAP 113* 131* 120* 117* 99    Iron Studies: No results found for this basename: IRON:30,TIBC:30,TRANSFERRIN:30,FERRITIN:30 in the last 168 hours  Physical Exam:  Blood pressure 148/65, pulse 90, temperature 99 F (37.2 C), temperature source Core (Comment), resp. rate 19, height 4' (1.219 m), weight 120.8 kg (266 lb 5.1 oz), SpO2 90.00%.   Gen: alert, obese, NAD- talkative Skin: no rash, cyanosis  Neck: no JVD, no bruits or LAN  Chest: clear bilat Heart: regular, no rub or gallop, no murmur  Abdomen: soft, obese, +BS hyperactive  Ext: bilat AKA no edema or ulceration  Neuro: alert, Ox3, no focal deficit     CPK- down to 5600 today   Impression/Plan  1. AKI due to ATN from rhabdomyolysis- creat up, but not a lot. Suspect trying to improve. Nonoliguric.  Did respond very well to lasix, diuresed 4 liters, I will decrease dose by half so  as not to cloud the issue.  CK has improved.  Since renal function normal at baseline, hopeful for recovery. No indications for HD 2. AMS- prob medication induced, valium and/or neurontin. Rec'd romazicon w good response. Also put on Bipap for CO2 retention. Much better today according to the family 3. Pulm edema - cxr was wet, will continue lasix but at decreased dose 4. Fever / ^ WBC- on empiric abx with levaquin now. Vanc/zosyn stopped. UCx neg.  Per CCM 5. DM 2- per primary 6. Bilat AKA 7. Obesity   Mark Russell A l 08/11/2011, 12:52 PM

## 2011-08-11 NOTE — Progress Notes (Signed)
Name: Mark Russell MRN: [id number] DOB: 12-16-1935    LOS: 5  Referring Provider: [facility]) Reason for Referral:  Hypercarbic resp failure.  PULMONARY / CRITICAL CARE MEDICINE  HPI:  76 yo former smoker with a plethora of health problems was found with AMS 8/1 and admitted to [facility]. Found to have rhabdo and acute renal failure. Treated with IVF and renal was consulted. 8/4 he was lethargic and abg revealed hypercarbic resp failure most likely exacerbated by renal failure and continuation of home pain and anxiety medication. Transferred to ICU and DNR temporarily suspended with short term intubation allowed.  NIMVS instituted and PCCM asked to assist in care.   Subjective States he feels a little better. Still speech slurred and weak.  Off BiPAP last night  Vital Signs: Temp:  [96.8 F (36 C)-99.3 F (37.4 C)] 98.4 F (36.9 C) (08/06 0700) Pulse Rate:  [81-96] 95  (08/06 0700) Resp:  [15-25] 16  (08/06 0630) BP: (108-159)/(43-69) 146/69 mmHg (08/06 0700) SpO2:  [83 %-98 %] 90 % (08/06 0700) FiO2 (%):  [40 %] 40 % (08/05 1000)  Intake/Output Summary (Last 24 hours) at 08/11/11 0809 Last data filed at 08/11/11 0700  Gross per 24 hour  Intake    352 ml  Output   4050 ml  Net  -3698 ml   5 liters n/c  Physical Examination: General:  MOWM, lethargic  Neuro:  Follows commands, Oriented. Drifts off to sleep quickly.  HEENT:  Neck large. Unable to assess JVD. + upper airway wheeze, decreased Cardiovascular:  hsd Lungs: Decreased in bases.  Abdomen:  obses +bs Musculoskeletal:  Bilateral AKA Skin:  warm  Active Problems:  Hypoxia  Leukocytosis  Sepsis  PVD (peripheral vascular disease)  S/P AKA (above knee amputation) bilateral  DVT (deep venous thrombosis)  ARF (acute renal failure)  Acute respiratory failure  Hypoxemia  Respiratory acidosis   ASSESSMENT AND PLAN  PULMONARY  Lab 08/10/11 1615 08/10/11 0520 08/09/11 1335 08/09/11 1000 08/06/11 1030  PHART  7.304* 7.227* 7.190* 7.158* 7.442  PCO2ART 61.0* 70.3* 75.7* 76.4* 43.4  PO2ART 106.0* 181.0* 79.0* 58.3* 67.3*  HCO3 29.4* 28.2* 27.8* 26.0* 29.1*  O2SAT 97.7 98.8 94.3 87.6 93.6   Ventilator Settings: Vent Mode:  [-]  FiO2 (%):  [40 %] 40 % CXR:   Improved aeration bilaterally.   ETT:    A:  Hypercarbic resp failure exacerbated by renal failure and oversedation and OHS.  Gas exchange improved. Still atelectatic on CXR. Clearly has baseline OHS/OSA and prob chronic respiratory failure. Off NIPPV all night. Gas exchange better.  P -diuresis as per renal - may need to back off as S Cr rising -cont  Bd's -hold sedating meds   CARDIOVASCULAR  Lab 08/09/11 0910 08/07/11 0609 08/06/11 1605 08/06/11 1000  TROPONINI -- -- -- --  LATICACIDVEN -- 1.2 3.7* 8.2*  PROBNP 1971.0* -- -- --   ECG:  Aflutter Lines:  A: CAD/ AFib/flutter P:  -coumadin   RENAL  Lab 08/11/11 0513 08/10/11 0407 08/09/11 1759 08/09/11 0910 08/08/11 0643  NA 142 137 136 136 137  K 3.6 4.4 -- -- --  CL 96 96 97 98 97  CO2 32 30 26 25 25   BUN 52* 40* 38* 36* 31*  CREATININE 6.10* 5.61* 5.30* 4.92* 4.20*  CALCIUM 8.7 8.5 8.2* 8.4 7.9*  MG -- 2.2 -- -- --  PHOS -- 7.0* -- -- --   Intake/Output      08/05 0701 - 08/06 0700 08/06  0701 - 08/07 0700   I.V. (mL/kg) 182.5 (1.5)    IV Piggyback 232    Total Intake(mL/kg) 414.5 (3.4)    Urine (mL/kg/hr) 4375 (1.5)    Total Output 4375    Net -3960.5         Stool Occurrence 1 x     Foley:  8/4  A: ARF in setting of sepsis and rhabdo. Scr worse, but UOP is good, we may need to back off on diuretics as his creatinine continues to rise.  P:   -per renal -Would decrease lasix given continued rise in S Cr and stabilization of resp status  GASTROINTESTINAL  Lab 08/07/11 0608 08/06/11 1000  AST 378* 19  ALT 38 13  ALKPHOS 51 65  BILITOT 0.4 0.4  PROT 7.1 8.1  ALBUMIN 3.1* 3.8    A:  No acute issue P:   Supportive care  HEMATOLOGIC  Lab  08/11/11 0513 08/10/11 0407 08/08/11 0643 08/07/11 0608 08/06/11 1000  HGB 13.1 12.7* 12.1* 14.0 15.4  HCT 41.0 39.3 37.5* 42.6 46.1  PLT 349 291 274 311 396  INR 2.82* 3.12* 2.78* 2.23* 2.38*  APTT -- -- -- -- 30  A: mild anemia, stable no evidence of bleeding. P: F/u cbc Trigger for x fusion <7  INFECTIOUS  Lab 08/11/11 0513 08/10/11 0407 08/08/11 0643 08/07/11 0608 08/06/11 1604 08/06/11 1000  WBC 12.8* 12.3* 14.4* 19.7* -- 23.9*  PROCALCITON -- -- -- -- 32.64 --   Cultures: 8/2 bc x 2>> 8/1 uc>>neg  Antibiotics: 8/4 levaquin>>  A: Presumed sepsis but source never identified.  P:   -per IM, plan to complete Levaquin after 8/10  ENDOCRINE  Lab 08/11/11 0756 08/11/11 0428 08/10/11 2315 08/10/11 1919 08/10/11 1550  GLUCAP 113* 131* 120* 117* 99   A:  dm  Excellent control P:   -ssi  NEUROLOGIC  A:  Acute encephalopathy due to narcs, benzo's in renal failure + hypercapnia P:   -avoid sedating meds.   BEST PRACTICE / DISPOSITION Level of Care:  ICU Primary Service:  [facility]:  Renal/PCCM-->PCCM will sign off call PRN.  Code Status:  DNR Diet:  npo DVT Px: on coumadin.  GI Px:  ppi Skin Integrity:   Social / Family:  Family at bedside. They are on the fence about short term HD.   BABCOCK,PETE, 08/11/2011, 8:09 AM  Attending Addendum:  I have seen the patient, discussed the issues, test results and plans with Kreg Shropshire, NP. I agree with the Assessment and Plans as ammended above.   Levy Pupa, MD, PhD 08/11/2011, 10:30 AM [facility] [phone] or if no answer [phone]

## 2011-08-11 NOTE — Progress Notes (Addendum)
ANTICOAGULATION CONSULT NOTE - Follow Up Consult  Pharmacy Consult for Coumadin Indication: h/o DVTs  Allergies  Allergen Reactions  . Morphine And Related Other (See Comments)    "mean"    Patient Measurements: Height: 4' (121.9 cm) Weight: 266 lb 5.1 oz (120.8 kg) IBW/kg (Calculated) : 22.4   Vital Signs: Temp: 98.6 F (37 C) (08/06 0900) BP: 152/62 mmHg (08/06 0800) Pulse Rate: 66  (08/06 0900)  Labs:  Basename 08/11/11 0513 08/10/11 0407 08/09/11 1759  HGB 13.1 12.7* --  HCT 41.0 39.3 --  PLT 349 291 --  APTT -- -- --  LABPROT 30.1* 32.6* --  INR 2.82* 3.12* --  HEPARINUNFRC -- -- --  CREATININE 6.10* 5.61* 5.30*  CKTOTAL 5644* 8428* --  CKMB -- -- --  TROPONINI -- -- --    Estimated Creatinine Clearance: 9.3 ml/min (by C-G formula based on Cr of 6.1).  Assessment:  37 YOM with h/o DVTs on Coumadin PTA.  PTA dose was 4 mg/day (confirmed with wife).    Per RN 8/5, pt with small scratch on face and bleeding noted from this area.  No more bleeding per RN this AM.   INR down to 2.82.  CBC wnl.  DDI with Levaquin noted - potentially can increase INR  Goal of Therapy:  INR 2-3 Monitor platelets by anticoagulation protocol: Yes   Plan:   Coumadin 0.5 mg po x 1 tonight  Daily PT/INR, pharmacy will f/u  Geoffry Paradise Thi 08/11/2011,9:30 AM

## 2011-08-11 NOTE — Progress Notes (Addendum)
Brief summary: [name] is a 76/M with history of diabetes,PVD/AKAs, history of DVTs on Coumadin, was found slumped over in his wheelchair by his wife morning of admission.  He was found to be febrile , and diaphoretic at home and subsequently brought to to the ER by EMS .  He complained of cough, occasionally productive of white sputum with wheezing off and on for 2 weeks, had a chest x-ray done about 10 days ago by his PCP which was reportedly normal. Subsequently admitted w/ sepsis suspected to be from a pulmonary source, started on Empiric antibiotics clinically improved, But by day 2, his creatinine started climbing, was seen by Renal in consultation on 8/2, His ARF was felt to be from Rhabdomyolysis and sepsis.  In addition on 8/4 he was noted to be in acute hypercarbic resp failure from meds/pulm edema/OSA, started on Bipap, also seen by Pulmonary in consultation. Then started on diuretics per Renal and respiratory statu has started to improve, now off bipap.     Assessment/Plan  1. Sepsis -improved Urine cultures and blood cultures negative Congestion and cough, raising concern for bronchitis vs early pneumonia as source although CXR was normal on 8/1, repeat 2 view CXR 8/3 with peribronchial thickening only, CXR 8/4 w/ RUL consolidation Dced IV Vancomycin and Zosyn 8/3, transitioned to levaquin 8/3, continue through 8/10 Repeat Lactic acid normalized   2. Toxic metabolic encephalopathy  Secondary to sepsis had completely resolved by Day 1 of admission Worsened (8/4) due to CO2 narcosis, meds Flumazenil given x1 8/4  Mentation improving Start soft diet  3. Acute Hypercarbic respiratory failure With acute change in mental status 8/4 With improvement since 8/4 night, more alert and interactive per family Suspect multifactorial, drug induced from decreased renal clearing(worsening GFR) of sedatives/Benzos/Gebapentin, Pulmonary edema and OSA Repeat CXR this am, CXR 8/5 w/ mild  improvement in pulmonary edema Net 5.4L negative, I'm inclined to cut down diuretics, will d/w Renal Off Bipap since last pm, last ABG with improvement in resp acidosis All sedating home meds stopped PCCM consult appreciated, DNI  4. ARF: ATN secondary to Rhabdomyolysis/ Sepsis  Creatinine plateaued,  Urine output still good, 2.4L in 24H, net 5.4L negative CK improving Simvastatin discontinued renal USG normal Vanc level-random was ok,  I/Os,   Repeat CK in am IVF Dced 8/4 am, will discuss diuretics with renal  5. Lactic acidosis: on admission, resolved in 24H  6. H/o DVT: continue coumadin per pharmacy  7. PVD S/P Bilateral AKA   8. DM: hold amaryl, SSI change to [facility] & HS  Code status: was changed to DNR 8/4 after discussion between PCCM and family, They do not want Intubation and hemodialysis at this point.   Zannie Cove  [facility]  Pager: [phone]  08/09/2011,     Subjective: More alert and appropriate this am, agitated last pm, received ativan with relief, couldn't tolerate BIPAP last pm on 5L nasal cannula   Objective: Vital signs in last 24 hours: Temp:  [96.8 F (36 C)-99.3 F (37.4 C)] 98.4 F (36.9 C) (08/06 0700) Pulse Rate:  [59-96] 95  (08/06 0700) Resp:  [15-25] 16  (08/06 0630) BP: (102-159)/(43-69) 146/69 mmHg (08/06 0700) SpO2:  [83 %-98 %] 90 % (08/06 0700) FiO2 (%):  [40 %] 40 % (08/05 1000) Weight change:  Last BM Date: 08/10/11  Intake/Output from previous day: 08/05 0701 - 08/06 0700 In: 414.5 [I.V.:182.5; IV Piggyback:232] Out: 2475 [Urine:2475]     Physical Exam: Alert, awake, oriented to  self, partly to place, initially said we're in [place], no acute distress, slight flushing of the face  HEENT : Pupils equal reactive to light, oral mucosa moist and pink  CVS: S1S2/RRR, no m/r/g  Lungs: poor air movement bilaterally, coarse BS  Abd: soft, obese, a large vertical surgical scar, multiple small ventral abdominal hernia is  without evidence of strangulation  No flank tenderness  Ext: bilateral AKA, no evidence of skin breakdown    Lab Results: Basic Metabolic Panel:  Basename 08/11/11 0513 08/10/11 0407  NA 142 137  K 3.6 4.4  CL 96 96  CO2 32 30  GLUCOSE 130* 137*  BUN 52* 40*  CREATININE 6.10* 5.61*  CALCIUM 8.7 8.5  MG -- 2.2  PHOS -- 7.0*   Liver Function Tests: No results found for this basename: AST:2,ALT:2,ALKPHOS:2,BILITOT:2,PROT:2,ALBUMIN:2 in the last 72 hours No results found for this basename: LIPASE:2,AMYLASE:2 in the last 72 hours No results found for this basename: AMMONIA:2 in the last 72 hours CBC:  Basename 08/11/11 0513 08/10/11 0407  WBC 12.8* 12.3*  NEUTROABS -- --  HGB 13.1 12.7*  HCT 41.0 39.3  MCV 98.3 98.0  PLT 349 291   Cardiac Enzymes:  Basename 08/11/11 0513 08/10/11 0407  CKTOTAL 5644* 8428*  CKMB -- --  CKMBINDEX -- --  TROPONINI -- --   BNP:  Basename 08/09/11 0910  PROBNP 1971.0*   D-Dimer: No results found for this basename: DDIMER:2 in the last 72 hours CBG:  Basename 08/11/11 0428 08/10/11 2315 08/10/11 1919 08/10/11 1550 08/10/11 1146 08/10/11 0757  GLUCAP 131* 120* 117* 99 99 140*   Hemoglobin A1C: No results found for this basename: HGBA1C in the last 72 hours Fasting Lipid Panel: No results found for this basename: CHOL,HDL,LDLCALC,TRIG,CHOLHDL,LDLDIRECT in the last 72 hours Thyroid Function Tests: No results found for this basename: TSH,T4TOTAL,FREET4,T3FREE,THYROIDAB in the last 72 hours Anemia Panel: No results found for this basename: VITAMINB12,FOLATE,FERRITIN,TIBC,IRON,RETICCTPCT in the last 72 hours Coagulation:  Basename 08/11/11 0513 08/10/11 0407  LABPROT 30.1* 32.6*  INR 2.82* 3.12*   Urine Drug Screen: Drugs of Abuse  No results found for this basename: labopia,  cocainscrnur,  labbenz,  amphetmu,  thcu,  labbarb    Alcohol Level: No results found for this basename: ETH:2 in the last 72 hours Urinalysis: No  results found for this basename: COLORURINE:2,APPERANCEUR:2,LABSPEC:2,PHURINE:2,GLUCOSEU:2,HGBUR:2,BILIRUBINUR:2,KETONESUR:2,PROTEINUR:2,UROBILINOGEN:2,NITRITE:2,LEUKOCYTESUR:2 in the last 72 hours  Recent Results (from the past 240 hour(s))  CULTURE, BLOOD (ROUTINE X 2)     Status: Normal (Preliminary result)   Collection Time   08/06/11 10:00 AM      Component Value Range Status Comment   Specimen Description BLOOD LEFT HAND   Final    Special Requests BOTTLES DRAWN AEROBIC AND ANAEROBIC [facility] EACH   Final    Culture  Setup Time 08/06/2011 12:54   Final    Culture     Final    Value:        BLOOD CULTURE RECEIVED NO GROWTH TO DATE CULTURE WILL BE HELD FOR 5 DAYS BEFORE ISSUING A FINAL NEGATIVE REPORT   Report Status PENDING   Incomplete   CULTURE, BLOOD (ROUTINE X 2)     Status: Normal (Preliminary result)   Collection Time   08/06/11 11:32 AM      Component Value Range Status Comment   Specimen Description BLOOD LEFT ARM   Final    Special Requests BOTTLES DRAWN AEROBIC AND ANAEROBIC [facility] EACH   Final    Culture  Setup Time  08/06/2011 23:11   Final    Culture     Final    Value:        BLOOD CULTURE RECEIVED NO GROWTH TO DATE CULTURE WILL BE HELD FOR 5 DAYS BEFORE ISSUING A FINAL NEGATIVE REPORT   Report Status PENDING   Incomplete   URINE CULTURE     Status: Normal   Collection Time   08/06/11 11:57 AM      Component Value Range Status Comment   Specimen Description URINE, CATHETERIZED   Final    Special Requests NONE   Final    Culture  Setup Time 08/06/2011 19:58   Final    Colony Count NO GROWTH   Final    Culture NO GROWTH   Final    Report Status 08/07/2011 FINAL   Final   MRSA PCR SCREENING     Status: Normal   Collection Time   08/06/11  3:45 PM      Component Value Range Status Comment   MRSA by PCR NEGATIVE  NEGATIVE Final     Studies/Results: Dg Chest Port 1 View  08/10/2011  *RADIOLOGY REPORT*  Clinical Data: CHF.  Pulmonary edema.  PORTABLE CHEST - 1 VIEW  Comparison:  08/09/2011.  Findings: Slightly improved airspace disease in the upper lobes. Persistent bilateral basilar airspace disease and atelectasis. Cardiomegaly.  There is improved aeration of the right upper lobe, likely representing resolution of mucous plugging.  IMPRESSION: Mild improvement in pulmonary aeration/edema with mild to moderate CHF.  Original Report Authenticated By: Andreas Newport, M.D.   Dg Chest Port 1 View  08/09/2011  *RADIOLOGY REPORT*  Clinical Data: Respiratory distress.  PORTABLE CHEST - 1 VIEW  Comparison: 08/08/2011  Findings: Heart size appears enlarged.  Lung volumes are low and there is diffuse pulmonary edema.  Focal area of consolidation is noted in the right upper lobe.  IMPRESSION:  1.  Pulmonary edema 2.  Right upper lobe consolidation.  Original Report Authenticated By: Rosealee Albee, M.D.    Medications: Scheduled Meds:    . antiseptic oral rinse  15 mL Mouth Rinse q12n4p  . chlorhexidine  15 mL Mouth Rinse BID  . furosemide  160 mg Intravenous Q12H  . insulin aspart  0-9 Units Subcutaneous Q4H  . ipratropium  0.5 mg Nebulization Q6H  . levalbuterol  0.63 mg Nebulization Q6H  . levofloxacin (LEVAQUIN) IV  500 mg Intravenous Q48H  . LORazepam      . LORazepam  0.26 mg Intravenous Once  . pantoprazole (PROTONIX) IV  40 mg Intravenous QHS  . sodium chloride  3 mL Intravenous Q12H  . vitamin A & D      . Warfarin - Pharmacist Dosing Inpatient   Does not apply q1800  . DISCONTD: insulin aspart  0-9 Units Subcutaneous TID WC   Continuous Infusions:    . DISCONTD: naloxone 0.25 mg/hr (08/10/11 0259)   PRN Meds:.acetaminophen, acetaminophen, LORazepam, ondansetron (ZOFRAN) IV, ondansetron     LOS: 5 days   Saja Bartolini [facility] Pager: [phone] 08/11/2011, 7:30 AM

## 2011-08-11 NOTE — Progress Notes (Signed)
Pt. Only tolerated bipap for about a hour. The pt. Then became agitated with attempts to pull the mask off. RN took pt. Off the bipap and placed the pt. On nasal cannula. The pt. Currently on 5L nasal cannula.

## 2011-08-12 LAB — CBC
HCT: 40.5 % (ref 39.0–52.0)
Hemoglobin: 13.4 g/dL (ref 13.0–17.0)
MCH: 31.7 pg (ref 26.0–34.0)
MCHC: 33.1 g/dL (ref 30.0–36.0)
MCV: 95.7 fL (ref 78.0–100.0)
Platelets: 368 10*3/uL (ref 150–400)
RBC: 4.23 MIL/uL (ref 4.22–5.81)
RDW: 15.2 % (ref 11.5–15.5)
WBC: 11.2 10*3/uL — ABNORMAL HIGH (ref 4.0–10.5)

## 2011-08-12 LAB — GLUCOSE, CAPILLARY
Glucose-Capillary: 142 mg/dL — ABNORMAL HIGH (ref 70–99)
Glucose-Capillary: 151 mg/dL — ABNORMAL HIGH (ref 70–99)
Glucose-Capillary: 152 mg/dL — ABNORMAL HIGH (ref 70–99)
Glucose-Capillary: 160 mg/dL — ABNORMAL HIGH (ref 70–99)

## 2011-08-12 LAB — CULTURE, BLOOD (ROUTINE X 2)
Culture: NO GROWTH
Culture: NO GROWTH

## 2011-08-12 LAB — BASIC METABOLIC PANEL
BUN: 61 mg/dL — ABNORMAL HIGH (ref 6–23)
CO2: 30 mEq/L (ref 19–32)
Calcium: 8.7 mg/dL (ref 8.4–10.5)
Chloride: 91 mEq/L — ABNORMAL LOW (ref 96–112)
Creatinine, Ser: 5.97 mg/dL — ABNORMAL HIGH (ref 0.50–1.35)
GFR calc Af Amer: 10 mL/min — ABNORMAL LOW (ref >90)
GFR calc non Af Amer: 8 mL/min — ABNORMAL LOW (ref >90)
Glucose, Bld: 141 mg/dL — ABNORMAL HIGH (ref 70–99)
Potassium: 3.2 mEq/L — ABNORMAL LOW (ref 3.5–5.1)
Sodium: 138 mEq/L (ref 135–145)

## 2011-08-12 LAB — CK: Total CK: 3226 U/L — ABNORMAL HIGH (ref 7–232)

## 2011-08-12 LAB — PROTIME-INR
INR: 2.14 — ABNORMAL HIGH (ref 0.00–1.49)
Prothrombin Time: 24.3 seconds — ABNORMAL HIGH (ref 11.6–15.2)

## 2011-08-12 MED ORDER — LEVOFLOXACIN 500 MG PO TABS
500.0000 mg | ORAL_TABLET | ORAL | Status: DC
  Administered 2011-08-14: 500 mg via ORAL
  Filled 2011-08-12: qty 1

## 2011-08-12 MED ORDER — WARFARIN SODIUM 3 MG PO TABS
3.0000 mg | ORAL_TABLET | Freq: Once | ORAL | Status: AC
  Administered 2011-08-12: 3 mg via ORAL
  Filled 2011-08-12: qty 1

## 2011-08-12 MED ORDER — POTASSIUM CHLORIDE CRYS ER 20 MEQ PO TBCR
40.0000 meq | EXTENDED_RELEASE_TABLET | Freq: Once | ORAL | Status: AC
  Administered 2011-08-12: 40 meq via ORAL
  Filled 2011-08-12 (×3): qty 1

## 2011-08-12 NOTE — Progress Notes (Signed)
Subjective:  No further need for bipap.  Continues to respond well to lasix even at a decreased dose. Creatinine has trended down for the first time !!! Family upset about discussion with the hospitalist Objective Vital signs in last 24 hours: Filed Vitals:   08/12/11 0125 08/12/11 0200 08/12/11 0300 08/12/11 0500  BP:  117/52 128/56 149/61  Pulse:  73 47 55  Temp:  98.4 F (36.9 C) 98.6 F (37 C) 99.3 F (37.4 C)  TempSrc:      Resp:  18 17 17   Height:      Weight:      SpO2: 93% 90% 89% 89%   Weight change:   Intake/Output Summary (Last 24 hours) at 08/12/11 1132 Last data filed at 08/12/11 0558  Gross per 24 hour  Intake    224 ml  Output   3380 ml  Net  -3156 ml   Labs: Basic Metabolic Panel:  Lab 08/12/11 1610 08/11/11 0513 08/10/11 0407 08/09/11 1759 08/09/11 0910 08/08/11 0643 08/07/11 0828  NA 138 142 137 136 136 137 138  K 3.2* 3.6 4.4 3.9 4.9 3.6 3.2*  CL 91* 96 96 97 98 97 95*  CO2 30 32 30 26 25 25 30   GLUCOSE 141* 130* 137* 123* 136* 152* 149*  BUN 61* 52* 40* 38* 36* 31* 23  CREATININE 5.97* 6.10* 5.61* 5.30* 4.92* 4.20* 2.68*  ALB -- -- -- -- -- -- --  CALCIUM 8.7 8.7 8.5 8.2* 8.4 7.9* 8.6  PHOS -- -- 7.0* -- -- -- --   Liver Function Tests:  Lab 08/07/11 0608 08/06/11 1000  AST 378* 19  ALT 38 13  ALKPHOS 51 65  BILITOT 0.4 0.4  PROT 7.1 8.1  ALBUMIN 3.1* 3.8   No results found for this basename: LIPASE:3,AMYLASE:3 in the last 168 hours No results found for this basename: AMMONIA:3 in the last 168 hours CBC:  Lab 08/12/11 0325 08/11/11 0513 08/10/11 0407 08/08/11 0643 08/06/11 1000  WBC 11.2* 12.8* 12.3* 14.4* --  NEUTROABS -- -- -- -- 20.9*  HGB 13.4 13.1 12.7* 12.1* --  HCT 40.5 41.0 39.3 37.5* --  MCV 95.7 98.3 98.0 96.6 --  PLT 368 349 291 274 --   PT/INR: @labrcntip (inr:5) Cardiac Enzymes:  Lab 08/12/11 0325 08/11/11 0513 08/10/11 0407 08/08/11 0645  CKTOTAL 3226* 5644* 8428* 13318*  CKMB -- -- -- --  CKMBINDEX -- -- -- --    TROPONINI -- -- -- --   CBG:  Lab 08/11/11 2136 08/11/11 1609 08/11/11 1137 08/11/11 0756 08/11/11 0428  GLUCAP 162* 169* 156* 113* 131*    Iron Studies: No results found for this basename: IRON:30,TIBC:30,TRANSFERRIN:30,FERRITIN:30 in the last 168 hours  Physical Exam:  Blood pressure 149/61, pulse 55, temperature 99.3 F (37.4 C), temperature source Core (Comment), resp. rate 17, height 4' (1.219 m), weight 120.8 kg (266 lb 5.1 oz), SpO2 89.00%.   Gen: alert, obese, NAD- talkative but confused Skin: no rash, cyanosis  Neck: no JVD, no bruits or LAN  Chest: clear bilat Heart: regular, no rub or gallop, no murmur  Abdomen: soft, obese, +BS hyperactive  Ext: bilat AKA no edema or ulceration  Neuro: alert, Ox3, no focal deficit     CPK- down to 3200 today   Impression/Plan  1. AKI due to ATN from rhabdomyolysis- creat now seems like it is trying to plateau and improve. Nonoliguric. continues to respond very well to lasix, even at decreased dose.  Will hold for now and  see what he does on his own now that out of pulmonary edema trouble.   CK has improved.  Since renal function normal at baseline, hopeful for recovery. No indications for HD and according to discussion with other MDs does not want? Did not confirm but will keep in mind.  Thankfully no indications to date.  2. AMS- prob medication induced, valium and/or neurontin. Rec'd romazicon w good response.  Much better today according to the family but still appears confused 3. Pulm edema - cxr was wet, better now.  Holding lasix and to follow 4. Fever / ^ WBC- on empiric abx with levaquin now. Vanc/zosyn stopped. UCx neg.  Per CCM 5. DM 2- per primary 6. Bilat AKA 7. Obesity 8. Hypokalemia- will re order repletion and follow 9. Anemia- not an issue so far  I agree will likely need placement at discharge and I did share this with the family   Aaralyn Kil A l 08/12/2011, 11:32 AM

## 2011-08-12 NOTE — Progress Notes (Signed)
ANTICOAGULATION CONSULT NOTE - Follow Up Consult  Pharmacy Consult for Coumadin Indication: h/o DVTs  Allergies  Allergen Reactions  . Morphine And Related Other (See Comments)    "mean"    Patient Measurements: Height: 4' (121.9 cm) Weight: 266 lb 5.1 oz (120.8 kg) IBW/kg (Calculated) : 22.4   Vital Signs: Temp: 99.3 F (37.4 C) (08/07 0500) BP: 149/61 mmHg (08/07 0500) Pulse Rate: 55  (08/07 0500)  Labs:  Basename 08/12/11 0325 08/11/11 0513 08/10/11 0407  HGB 13.4 13.1 --  HCT 40.5 41.0 39.3  PLT 368 349 291  APTT -- -- --  LABPROT 24.3* 30.1* 32.6*  INR 2.14* 2.82* 3.12*  HEPARINUNFRC -- -- --  CREATININE 5.97* 6.10* 5.61*  CKTOTAL 3226* 5644* 8428*  CKMB -- -- --  TROPONINI -- -- --    Estimated Creatinine Clearance: 9.5 ml/min (by C-G formula based on Cr of 5.97).  Assessment:  76 YOM with h/o DVTs on Coumadin PTA.  PTA dose was 4 mg/day (confirmed with wife).    INR in therapeutic range cont to trend down after 0.5mg  yesterday.   CBC wnl. No bleeding reported/documented.  Dysphagia diet.  DDI with Levaquin noted - potentially can increase INR.  Goal of Therapy:  INR 2-3 Monitor platelets by anticoagulation protocol: Yes   Plan:   Coumadin 3mg  today at noon.  F/u daily PT/INR.  Charolotte Eke, PharmD, pager [phone]. 08/12/2011,9:59 AM.

## 2011-08-12 NOTE — Progress Notes (Signed)
Pharmacy - Brief note  Assessment: Patient on Levaquin IV since 8/3 for suspected urosepsis. ARF followed by Renal. SCr leveling off, CrCl ~26ml/min. All cx negative. MD changed to PO route today with plans to discontinue on 8/10.  Plan: Agree with Levaquin 500mg  PO q48h(next dose on 8/9)  Will f/u daily.    Charolotte Eke, PharmD, pager [phone]. 08/12/2011,11:29 AM.

## 2011-08-12 NOTE — Progress Notes (Signed)
PROGRESS NOTE  Mark Russell WUJ:[id number] DOB: 03-23-1935 DOA: 08/06/2011  PCP: Astrid Divine, MD  Brief narrative:  [name] is a 76/M with history of diabetes,PVD/AKAs, history of DVTs on Coumadin, was found slumped over in his wheelchair by his wife 08/06/11.  He was found to be febrile, and diaphoretic at home and subsequently brought to to the ER by EMS .  He complained of cough, occasionally productive of white sputum with wheezing off and on for 2 weeks, had a chest x-ray done about 10 days ago by his PCP which was reportedly normal.  Subsequently admitted w/ sepsis suspected to be from a pulmonary source, started on Empiric antibiotics clinically improved, But by day 2, his creatinine started climbing, was seen by Renal in consultation on 8/2, His ARF was felt to be from Rhabdomyolysis and sepsis.  In addition on 8/4 he was noted to be in acute hypercarbic resp failure from meds/pulm edema/OSA, started on Bipap, also seen by Pulmonary in consultation. Then started on diuretics per Renal and respiratory statu has started to improve, now off bipap.   Past medical history-As per Problem list  Chart reviewed as below-  Admit 07/01/04 c RLE gangrene + R AKA  Htn  Anxiety  Previous bypass surgery  AAA repair in 1988  Htn  ETOH abuse   Consultants:  Pulmonary  Renal  Procedures:  Chest x-ray two-view 08/06/2011 = stable exam, mild chronic bilateral peribronchial thickening, can be seen in setting of acute or chronic bronchitis, no definite airspace disease  Chest x-ray two-view 08/11/2011 = grossly unchanged findings suggestive of pulmonary edema  Antibiotics:  Zosyn 8/1--8/2  Vancomycin 8/1--8/2  Levaquin 8/3 >>>8/10   Subjective   Doing fair today.  Not very verbally responsive. No chest pain, no shortness of breath, nausea no vomiting no blurred vision no double vision No weakness     Objective   Interim History:  Nursing reports that patient was agitated this  morning and had to be medicated at 3 AM and at ADM with Ativan 0.5   Telemetry:  Sinus rhythm but desats   Objective:  Filed Vitals:    08/12/11 0125  08/12/11 0200  08/12/11 0300  08/12/11 0500   BP:   117/52  128/56  149/61   Pulse:   73  47  55   Temp:   98.4 F (36.9 C)  98.6 F (37 C)  99.3 F (37.4 C)   TempSrc:       Resp:   18  17  17    Height:       Weight:       SpO2:  93%  90%  89%  89%     Intake/Output Summary (Last 24 hours) at 08/12/11 0755 Last data filed at 08/12/11 0558   Gross per 24 hour   Intake  464 ml   Output  3805 ml   Net  -3341 ml    Exam:  General: Obese alert oriented x1 Caucasian male  Cardiovascular: S1-S2 no murmur rub or gallop  Respiratory: Fine wheezes to the left posterior base to regions of the lungs  Abdomen: Soft nontender nondistended no rebound or guarding midline scar noted  Skin patient has bilateral BKA's  Neuro slightly somnolent from Ativan and therefore not able to participate fully on examination   Data Reviewed:  Basic Metabolic Panel:   Lab  08/12/11 0325  08/11/11 0513  08/10/11 0407  08/09/11 1759  08/09/11 0910   NA  138  142  137  136  136   K  3.2*  3.6  --  --  --   CL  91*  96  96  97  98   CO2  30  32  30  26  25    GLUCOSE  141*  130*  137*  123*  136*   BUN  61*  52*  40*  38*  36*   CREATININE  5.97*  6.10*  5.61*  5.30*  4.92*   CALCIUM  8.7  8.7  8.5  8.2*  8.4   MG  --  --  2.2  --  --   PHOS  --  --  7.0*  --  --    Liver Function Tests:   Lab  08/07/11 0608  08/06/11 1000   AST  378*  19   ALT  38  13   ALKPHOS  51  65   BILITOT  0.4  0.4   PROT  7.1  8.1   ALBUMIN  3.1*  3.8    No results found for this basename: LIPASE:5,AMYLASE:5 in the last 168 hours  No results found for this basename: AMMONIA:5 in the last 168 hours  CBC:   Lab  08/12/11 0325  08/11/11 0513  08/10/11 0407  08/08/11 0643  08/07/11 0608  08/06/11 1000   WBC  11.2*  12.8*  12.3*  14.4*  19.7*  --   NEUTROABS  --  --   --  --  --  20.9*   HGB  13.4  13.1  12.7*  12.1*  14.0  --   HCT  40.5  41.0  39.3  37.5*  42.6  --   MCV  95.7  98.3  98.0  96.6  96.6  --   PLT  368  349  291  274  311  --    Cardiac Enzymes:   Lab  08/12/11 0325  08/11/11 0513  08/10/11 0407  08/08/11 0645   CKTOTAL  3226*  5644*  8428*  13318*   CKMB  --  --  --  --   CKMBINDEX  --  --  --  --   TROPONINI  --  --  --  --    BNP:  No components found with this basename: POCBNP:5  CBG:   Lab  08/11/11 2136  08/11/11 1609  08/11/11 1137  08/11/11 0756  08/11/11 0428   GLUCAP  162*  169*  156*  113*  131*    Recent Results (from the past 240 hour(s))   CULTURE, BLOOD (ROUTINE X 2) Status: Normal (Preliminary result)    Collection Time    08/06/11 10:00 AM   Component  Value  Range  Status  Comment    Specimen Description  BLOOD LEFT HAND   Final     Special Requests  BOTTLES DRAWN AEROBIC AND ANAEROBIC [facility] EACH   Final     Culture Setup Time  08/06/2011 12:54   Final     Culture    Final     Value:  BLOOD CULTURE RECEIVED NO GROWTH TO DATE CULTURE WILL BE HELD FOR 5 DAYS BEFORE ISSUING A FINAL NEGATIVE REPORT    Report Status  PENDING   Incomplete    CULTURE, BLOOD (ROUTINE X 2) Status: Normal (Preliminary result)    Collection Time    08/06/11 11:32 AM   Component  Value  Range  Status  Comment    Specimen  Description  BLOOD LEFT ARM   Final     Special Requests  BOTTLES DRAWN AEROBIC AND ANAEROBIC 3CC EACH   Final     Culture Setup Time  08/06/2011 23:11   Final     Culture    Final     Value:  BLOOD CULTURE RECEIVED NO GROWTH TO DATE CULTURE WILL BE HELD FOR 5 DAYS BEFORE ISSUING A FINAL NEGATIVE REPORT    Report Status  PENDING   Incomplete    URINE CULTURE Status: Normal    Collection Time    08/06/11 11:57 AM   Component  Value  Range  Status  Comment    Specimen Description  URINE, CATHETERIZED   Final     Special Requests  NONE   Final     Culture Setup Time  08/06/2011 19:58   Final     Colony Count  NO GROWTH    Final     Culture  NO GROWTH   Final     Report Status  08/07/2011 FINAL   Final    MRSA PCR SCREENING Status: Normal    Collection Time    08/06/11 3:45 PM   Component  Value  Range  Status  Comment    MRSA by PCR  NEGATIVE  NEGATIVE  Final     Studies:  All Imaging reviewed and is as per above notation   Scheduled Meds:  .  antiseptic oral rinse  15 mL  Mouth Rinse  q12n4p   .  chlorhexidine  15 mL  Mouth Rinse  BID   .  furosemide  80 mg  Intravenous  Q12H   .  insulin aspart  0-9 Units  Subcutaneous  TID WC   .  ipratropium  0.5 mg  Nebulization  Q6H   .  levalbuterol  0.63 mg  Nebulization  Q6H   .  levofloxacin (LEVAQUIN) IV  500 mg  Intravenous  Q48H   .  pantoprazole (PROTONIX) IV  40 mg  Intravenous  QHS   .  potassium chloride  40 mEq  Oral  Once   .  sodium chloride  3 mL  Intravenous  Q12H   .  warfarin  0.5 mg  Oral  ONCE-1800   .  Warfarin - Pharmacist Dosing Inpatient   Does not apply  q1800   .  DISCONTD: furosemide  160 mg  Intravenous  Q12H   .  DISCONTD: potassium chloride  10 mEq  Intravenous  Q1 Hr x 4    Continuous Infusions:  Assessment/Plan:  1. Sepsis -improved  Urine cultures and blood cultures negative  Congestion and cough, raising concern for bronchitis vs early pneumonia as source although CXR was normal on 8/1, repeat 2 view CXR 8/3 with peribronchial thickening only, CXR 8/4 w/ RUL consolidation  Dced IV Vancomycin and Zosyn 8/3, transitioned to levaquin 8/3, continue through 8/10-patient changed to by mouth Levaquin this morning Repeat Lactic acid normalized   2. Toxic metabolic encephalopathy  Secondary to sepsis had completely resolved by Day 1 of admission  Worsened (8/4) due to CO2 narcosis, meds  Flumazenil given x1 8/4  Mentation improving  Tolerating full diet now-graduate to full diabetic diet  3. Acute Hypercarbic respiratory failure  With acute change in mental status 8/4  With improvement since 8/4 night, more alert and interactive  per family  Suspect multifactorial, drug induced from decreased renal clearing(worsening GFR) of sedatives/Benzos/Gebapentin, Pulmonary edema and OSA  Repeat CXR 08/11/2011 shows  persistent pulmonary edema  Off Bipap since last pm, last ABG with improvement in resp acidosis-patient has a pickwickian habitus and will need BiPAP at night and formal sleep study as an outpatient All sedating home meds stopped  PCCM consult appreciated, DNI   4. ARF: ATN secondary to Rhabdomyolysis/ Sepsis  Creatinine plateaued, Urine output good I/O last 3 completed shifts: In: 640 [P.O.:280; I.V.:110; IV Piggyback:250] Out: 5705 [Urine:5705] CK improving  Simvastatin discontinued  renal USG normal  Vanc level-random was ok, IVF Dced 8/4 am, will discuss diuretics with renal   5. Lactic acidosis: on admission, resolved in 24H   6. H/o DVT: continue coumadin per pharmacy- Lab Results  Component Value Date   INR 2.14* 08/12/2011   INR 2.82* 08/11/2011   INR 3.12* 08/10/2011   7. PVD S/P Bilateral AKA-still complains of phantom pain.  Will be difficult to treat this with only gabapentin however the risks of sedation and confusion with opiates is to great.  Family aware  8. DM: hold amaryl, SSI change to [facility] & HS-CBG's styable  Code status: was changed to DNR 8/4 after discussion between PCCM and family, They do not want Intubation and hemodialysis at this point.  I had a detailed discussion with [name] the patient's daughter as well as his wife at bedside with regards to course of care and ultimate disposition-I definitely think that with the difficulty that he is eating and taking care of himself as well as difficulty with coordination he will need PT OT and but at the very least a skilled nursing facility long-term placement.  If patient continues to make decline we will consider discussion about palliative care   Patient stable for transfer to general telemetry Updated nursing about  Pleas Koch, MD    [facility]  Pager [phone]  08/12/2011, 7:55 AM  LOS: 6 days

## 2011-08-12 NOTE — Progress Notes (Signed)
Pt refusing to allow staff to care for him or come near him. Repeating "he is ready to go, we can't keep him here". Pt is A&O x3. Son-in-law at the bedside. Accusing him of allowing [place] to keep him here. Dr. Kizzie Bane notified of pt's condition. Pt has pulled of tele, bp cuff, and refuses medications. States "he is ready for his wife to come so she can take him home". Stayed at bedside with pt and son-in-law holding pleasant conversation. Wife arrived with daughter. Pt immediately became calm. Gave them a few min of private time. When entered room pt was calm, cooperative, willing to take medications, very apologetic for his behavior. Dr. Kizzie Bane also at bedside. Pt is now at baseline and pleasant.

## 2011-08-13 DIAGNOSIS — I739 Peripheral vascular disease, unspecified: Secondary | ICD-10-CM

## 2011-08-13 DIAGNOSIS — S88119A Complete traumatic amputation at level between knee and ankle, unspecified lower leg, initial encounter: Secondary | ICD-10-CM

## 2011-08-13 DIAGNOSIS — E860 Dehydration: Secondary | ICD-10-CM

## 2011-08-13 DIAGNOSIS — M6282 Rhabdomyolysis: Secondary | ICD-10-CM

## 2011-08-13 DIAGNOSIS — A4189 Other specified sepsis: Secondary | ICD-10-CM

## 2011-08-13 DIAGNOSIS — L98499 Non-pressure chronic ulcer of skin of other sites with unspecified severity: Secondary | ICD-10-CM

## 2011-08-13 LAB — BASIC METABOLIC PANEL
BUN: 70 mg/dL — ABNORMAL HIGH (ref 6–23)
CO2: 35 mEq/L — ABNORMAL HIGH (ref 19–32)
Calcium: 9.1 mg/dL (ref 8.4–10.5)
Chloride: 95 mEq/L — ABNORMAL LOW (ref 96–112)
Creatinine, Ser: 5.77 mg/dL — ABNORMAL HIGH (ref 0.50–1.35)
GFR calc Af Amer: 10 mL/min — ABNORMAL LOW (ref >90)
GFR calc non Af Amer: 9 mL/min — ABNORMAL LOW (ref >90)
Glucose, Bld: 141 mg/dL — ABNORMAL HIGH (ref 70–99)
Potassium: 3.7 mEq/L (ref 3.5–5.1)
Sodium: 142 mEq/L (ref 135–145)

## 2011-08-13 LAB — CBC WITH DIFFERENTIAL/PLATELET
Basophils Absolute: 0.1 10*3/uL (ref 0.0–0.1)
Basophils Relative: 1 % (ref 0–1)
Eosinophils Absolute: 0.7 10*3/uL (ref 0.0–0.7)
Eosinophils Relative: 7 % — ABNORMAL HIGH (ref 0–5)
HCT: 42.4 % (ref 39.0–52.0)
Hemoglobin: 13.4 g/dL (ref 13.0–17.0)
Lymphocytes Relative: 9 % — ABNORMAL LOW (ref 12–46)
Lymphs Abs: 1 10*3/uL (ref 0.7–4.0)
MCH: 31.1 pg (ref 26.0–34.0)
MCHC: 31.6 g/dL (ref 30.0–36.0)
MCV: 98.4 fL (ref 78.0–100.0)
Monocytes Absolute: 1.1 10*3/uL — ABNORMAL HIGH (ref 0.1–1.0)
Monocytes Relative: 11 % (ref 3–12)
Neutro Abs: 7.6 10*3/uL (ref 1.7–7.7)
Neutrophils Relative %: 73 % (ref 43–77)
Platelets: 393 10*3/uL (ref 150–400)
RBC: 4.31 MIL/uL (ref 4.22–5.81)
RDW: 15.3 % (ref 11.5–15.5)
WBC: 10.4 10*3/uL (ref 4.0–10.5)

## 2011-08-13 LAB — PROTIME-INR
INR: 1.86 — ABNORMAL HIGH (ref 0.00–1.49)
Prothrombin Time: 21.8 seconds — ABNORMAL HIGH (ref 11.6–15.2)

## 2011-08-13 LAB — CK: Total CK: 1324 U/L — ABNORMAL HIGH (ref 7–232)

## 2011-08-13 MED ORDER — FLUOXETINE HCL 10 MG PO CAPS
10.0000 mg | ORAL_CAPSULE | Freq: Every day | ORAL | Status: DC
  Administered 2011-08-13 – 2011-08-15 (×3): 10 mg via ORAL
  Filled 2011-08-13 (×3): qty 1

## 2011-08-13 MED ORDER — PANTOPRAZOLE SODIUM 40 MG PO TBEC
40.0000 mg | DELAYED_RELEASE_TABLET | Freq: Every day | ORAL | Status: DC
  Administered 2011-08-13 – 2011-08-15 (×3): 40 mg via ORAL
  Filled 2011-08-13 (×3): qty 1

## 2011-08-13 MED ORDER — IPRATROPIUM BROMIDE 0.02 % IN SOLN
0.5000 mg | Freq: Three times a day (TID) | RESPIRATORY_TRACT | Status: DC
  Administered 2011-08-14 – 2011-08-15 (×4): 0.5 mg via RESPIRATORY_TRACT
  Filled 2011-08-13 (×5): qty 2.5

## 2011-08-13 MED ORDER — ACETAMINOPHEN 500 MG PO TABS: 1000.0000 mg | ORAL_TABLET | Freq: Once | ORAL | Status: DC

## 2011-08-13 MED ORDER — ACETAMINOPHEN 500 MG PO TABS
ORAL_TABLET | ORAL | Status: AC
  Administered 2011-08-13: 1000 mg
  Filled 2011-08-13: qty 2

## 2011-08-13 MED ORDER — WARFARIN SODIUM 3 MG PO TABS
3.0000 mg | ORAL_TABLET | Freq: Once | ORAL | Status: AC
  Administered 2011-08-13: 3 mg via ORAL
  Filled 2011-08-13: qty 1

## 2011-08-13 MED ORDER — GABAPENTIN 300 MG PO CAPS
900.0000 mg | ORAL_CAPSULE | Freq: Three times a day (TID) | ORAL | Status: DC
  Administered 2011-08-13 – 2011-08-15 (×7): 900 mg via ORAL
  Filled 2011-08-13 (×9): qty 3

## 2011-08-13 MED ORDER — FLUOXETINE HCL 20 MG PO TABS
10.0000 mg | ORAL_TABLET | Freq: Every day | ORAL | Status: DC
  Filled 2011-08-13: qty 1

## 2011-08-13 MED ORDER — LEVALBUTEROL HCL 0.63 MG/3ML IN NEBU
0.6300 mg | INHALATION_SOLUTION | Freq: Three times a day (TID) | RESPIRATORY_TRACT | Status: DC
  Administered 2011-08-14 – 2011-08-15 (×4): 0.63 mg via RESPIRATORY_TRACT
  Filled 2011-08-13 (×7): qty 3

## 2011-08-13 NOTE — Progress Notes (Signed)
PROGRESS NOTE  Mark Russell ZOX:[id number] DOB: January 24, 1935 DOA: 08/06/2011  PCP: Astrid Divine, MD  Brief narrative:  Mark Russell is a 76/M with history of diabetes,PVD/AKAs, history of DVTs on Coumadin, was found slumped over in his wheelchair by his wife 08/06/11.  He was found to be febrile, and diaphoretic at home and subsequently brought to to the ER by EMS .  He complained of cough, occasionally productive of white sputum with wheezing off and on for 2 weeks, had a chest x-ray done about 10 days ago by his PCP which was reportedly normal.  Subsequently admitted w/ sepsis suspected to be from a pulmonary source, started on Empiric antibiotics clinically improved, But by day 2, his creatinine started climbing, was seen by Renal in consultation on 8/2, His ARF was felt to be from Rhabdomyolysis and sepsis.  In addition on 8/4 he was noted to be in acute hypercarbic resp failure from meds/pulm edema/OSA, started on Bipap, also seen by Pulmonary in consultation. Then started on diuretics per Renal and respiratory statu has started to improve, now off bipap-he was transferred back to [facility] 08/12/11 and did not have any recurrence of CO2 narcosis  Past medical history-As per Problem list  Chart reviewed as below-  Admit 07/01/04 c RLE gangrene + R AKA  Htn  Anxiety  Previous bypass surgery  AAA repair in 1988  Htn  ETOH abuse   Consultants:  Pulmonary  Renal  Procedures:  Chest x-ray two-view 08/06/2011 = stable exam, mild chronic bilateral peribronchial thickening, can be seen in setting of acute or chronic bronchitis, no definite airspace disease  Chest x-ray two-view 08/11/2011 = grossly unchanged findings suggestive of pulmonary edema  Antibiotics:  Zosyn 8/1--8/2  Vancomycin 8/1--8/2  Levaquin 8/3 >>>8/10   Subjective   Looking much better today.  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 [employer] He also is able to do most ADLs at home including use a riding lawn mower  Objective   Interim History:  Nursing reports no further agitation last night  Telemetry:  Sinus rhythm with occasional PVCs  Objective:  Filed Vitals:    08/12/11 0125  08/12/11 0200  08/12/11 0300  08/12/11 0500   BP:   117/52  128/56  149/61   Pulse:   73  47  55   Temp:   98.4 F (36.9 C)  98.6 F (37 C)  99.3 F (37.4 C)   TempSrc:       Resp:   18  17  17    Height:       Weight:       SpO2:  93%  90%  89%  89%     Intake/Output Summary (Last 24 hours) at 08/12/11 0755 Last data filed at 08/12/11 0558   Gross per 24 hour   Intake  464 ml   Output  3805 ml   Net  -3341 ml    Exam:  General: Obese alert oriented x1 Caucasian male  Cardiovascular: S1-S2 no murmur rub or gallop  Respiratory: Fine wheezes to the left posterior base to regions of the lungs  Abdomen: Soft nontender nondistended no rebound or guarding midline scar noted  Skin patient has bilateral BKA's  Neuro slightly somnolent from Ativan and therefore not able to participate fully on examination  Data Reviewed:  Basic Metabolic Panel:   Lab  08/12/11 0325  08/11/11 0513  08/10/11 0407  08/09/11 1759  08/09/11 0910   NA  138  142  137  136  136   K  3.2*  3.6  --  --  --   CL  91*  96  96  97  98   CO2  30  32  30  26  25    GLUCOSE  141*  130*  137*  123*  136*   BUN  61*  52*  40*  38*  36*   CREATININE  5.97*  6.10*  5.61*  5.30*  4.92*   CALCIUM  8.7  8.7  8.5  8.2*  8.4   MG  --  --  2.2  --  --   PHOS  --  --  7.0*  --  --    Liver Function Tests:   Lab  08/07/11 0608  08/06/11 1000   AST  378*  19   ALT  38  13   ALKPHOS  51  65   BILITOT  0.4  0.4   PROT  7.1  8.1   ALBUMIN  3.1*  3.8    No results found for this basename: LIPASE:5,AMYLASE:5 in the last 168 hours  No results found for this  basename: AMMONIA:5 in the last 168 hours  CBC:   Lab  08/12/11 0325  08/11/11 0513  08/10/11 0407  08/08/11 0643  08/07/11 0608  08/06/11 1000   WBC  11.2*  12.8*  12.3*  14.4*  19.7*  --   NEUTROABS  --  --  --  --  --  20.9*   HGB  13.4  13.1  12.7*  12.1*  14.0  --   HCT  40.5  41.0  39.3  37.5*  42.6  --   MCV  95.7  98.3  98.0  96.6  96.6  --   PLT  368  349  291  274  311  --    Cardiac Enzymes:   Lab  08/12/11 0325  08/11/11 0513  08/10/11 0407  08/08/11 0645   CKTOTAL  3226*  5644*  8428*  13318*   CKMB  --  --  --  --   CKMBINDEX  --  --  --  --   TROPONINI  --  --  --  --    BNP:  No components found with this basename: POCBNP:5  CBG:   Lab  08/11/11 2136  08/11/11 1609  08/11/11 1137  08/11/11 0756  08/11/11 0428   GLUCAP  162*  169*  156*  113*  131*    Recent Results (from the past 240 hour(s))   CULTURE, BLOOD (ROUTINE X 2) Status: Normal (Preliminary result)    Collection Time    08/06/11 10:00 AM   Component  Value  Range  Status  Comment    Specimen Description  BLOOD LEFT HAND   Final     Special Requests  BOTTLES DRAWN AEROBIC AND ANAEROBIC [facility] EACH   Final     Culture Setup Time  08/06/2011 12:54   Final     Culture    Final     Value:  BLOOD CULTURE RECEIVED NO GROWTH TO DATE CULTURE WILL BE HELD FOR 5 DAYS BEFORE ISSUING A FINAL NEGATIVE REPORT    Report Status  PENDING   Incomplete    CULTURE, BLOOD (ROUTINE X 2)  Status: Normal (Preliminary result)    Collection Time    08/06/11 11:32 AM   Component  Value  Range  Status  Comment    Specimen Description  BLOOD LEFT ARM   Final     Special Requests  BOTTLES DRAWN AEROBIC AND ANAEROBIC 3[facility] EACH   Final     Culture Setup Time  08/06/2011 23:11   Final     Culture    Final     Value:  BLOOD CULTURE RECEIVED NO GROWTH TO DATE CULTURE WILL BE HELD FOR 5 DAYS BEFORE ISSUING A FINAL NEGATIVE REPORT    Report Status  PENDING   Incomplete    URINE CULTURE Status: Normal    Collection Time    08/06/11 11:57 AM    Component  Value  Range  Status  Comment    Specimen Description  URINE, CATHETERIZED   Final     Special Requests  NONE   Final     Culture Setup Time  08/06/2011 19:58   Final     Colony Count  NO GROWTH   Final     Culture  NO GROWTH   Final     Report Status  08/07/2011 FINAL   Final    MRSA PCR SCREENING Status: Normal    Collection Time    08/06/11 3:45 PM   Component  Value  Range  Status  Comment    MRSA by PCR  NEGATIVE  NEGATIVE  Final     Studies:  All Imaging reviewed and is as per above notation   Scheduled Meds:  .  antiseptic oral rinse  15 mL  Mouth Rinse  q12n4p   .  chlorhexidine  15 mL  Mouth Rinse  BID   .  furosemide  80 mg  Intravenous  Q12H   .  insulin aspart  0-9 Units  Subcutaneous  TID WC   .  ipratropium  0.5 mg  Nebulization  Q6H   .  levalbuterol  0.63 mg  Nebulization  Q6H   .  levofloxacin (LEVAQUIN) IV  500 mg  Intravenous  Q48H   .  pantoprazole (PROTONIX) IV  40 mg  Intravenous  QHS   .  potassium chloride  40 mEq  Oral  Once   .  sodium chloride  3 mL  Intravenous  Q12H   .  warfarin  0.5 mg  Oral  ONCE-1800   .  Warfarin - Pharmacist Dosing Inpatient   Does not apply  q1800   .  DISCONTD: furosemide  160 mg  Intravenous  Q12H   .  DISCONTD: potassium chloride  10 mEq  Intravenous  Q1 Hr x 4    Continuous Infusions:  Assessment/Plan:  1. Sepsis -improved  Urine cultures and blood cultures negative  Congestion and cough, raising concern for bronchitis vs early pneumonia as source although CXR was normal on 8/1, repeat 2 view CXR 8/3 with peribronchial thickening only, CXR 8/4 w/ RUL consolidation  Dced IV Vancomycin and Zosyn 8/3, transitioned to levaquin 8/3, continue through 8/10-patient changed to by mouth Levaquin 08/12/2011 Repeat Lactic acid normalized   2. Toxic metabolic encephalopathy  Secondary to sepsis had completely resolved by Day 1 of admission  Worsened (8/4) due to CO2 narcosis, meds  Flumazenil given x1 8/4  Mentation  improving-seems to be a baseline Would limit narcotics and benzodiazepines as an outpatient-will inform Dr. Valentina Lucks about this Tolerating full diet now-graduate to full diabetic diet  3.  Acute Hypercarbic respiratory failure  With acute change in mental status 8/4  With improvement since 8/4 night, more alert and interactive per family  Suspect multifactorial, drug induced from decreased renal clearing(worsening GFR) of sedatives/Benzos/Gabapentin, Pulmonary edema and OSA  Repeat CXR 08/11/2011 shows persistent pulmonary edema  Off Bipap since last pm, last ABG with improvement in resp acidosis-patient has a pickwickian habitus and will need BiPAP at night and formal sleep study as an outpatient-he refused to use BiPAP 8/713 All sedating home meds stopped  PCCM consult appreciated, DNI   4. ARF: ATN secondary to Rhabdomyolysis/ Sepsis  Creatinine improving substantially, Urine output good I/O last 3 completed shifts: In: 658 [P.O.:640; IV Piggyback:18] Out: 3230 [Urine:3230] CK improving  Simvastatin discontinued  renal USG normal  Vanc level-random was ok, IVF Dced 8/4 am, diuretics to be tapered according to renal physician-appreciate input  5. Lactic acidosis: on admission, resolved in 24H   6. H/o DVT: continue coumadin per pharmacy- Lab Results  Component Value Date   INR 1.86* 08/13/2011   INR 2.14* 08/12/2011   INR 2.82* 08/11/2011   7. PVD S/P Bilateral AKA-still complains of phantom pain.  Will be difficult to treat this with only gabapentin however the risks of sedation and confusion with opiates is to great.  Family aware-I have restarted his gabapentin and his Prozac 08/13/2011 at home dosages of 900 3 times a day and 10 mg daily-discontinued all benzodiazepines in his specific case, discontinue opiates  8. DM: hold amaryl, SSI change to [facility] & HS-CBG's stable.  Continue sensitive sliding scale coverage  Code status: was changed to DNR 8/4 after discussion between PCCM and  family, They do not want Intubation and hemodialysis at this point.  I had a detailed discussion with Mark Russell the patient's daughter as well as his wife at bedside with regards to course of care and ultimate disposition  Patient stable for transfer to general telemetry Updated nursing about holding any narcs/BZD's and let me know  Pleas Koch, MD  [facility]  Pager [phone]  08/12/2011, 7:55 AM  LOS: 6 days

## 2011-08-13 NOTE — Progress Notes (Signed)
  Pharmacy Note: IV --> PO Conversion  The patient is receiving Protonix by the intravenous route.  Based on criteria approved by the Pharmacy and Therapeutics Committee and the Medical Executive Committee, the medication is being converted to the equivalent oral dose form.  These criteria include: -No Active GI bleeding -Able to tolerate diet of full liquids (or better) or tube feeding OR able to tolerate other medications by the oral or enteral route  If you have any questions about this conversion, please contact the Pharmacy Department (ext [phone]).  Thank you.  Darrol Angel, PharmD Pager: [phone] 08/13/2011 9:56 AM

## 2011-08-13 NOTE — Progress Notes (Signed)
ANTICOAGULATION CONSULT NOTE - Follow Up  Pharmacy Consult for Coumadin Indication: h/o DVTs  Allergies  Allergen Reactions  . Morphine And Related Other (See Comments)    "mean"    Patient Measurements: Height: 4' (121.9 cm) Weight: 266 lb 5.1 oz (120.8 kg) IBW/kg (Calculated) : 22.4   Vital Signs: Temp: 97.8 F (36.6 C) (08/08 0625) Temp src: Axillary (08/08 0625) BP: 118/42 mmHg (08/08 0710) Pulse Rate: 81  (08/08 0625)  Labs:  Basename 08/13/11 0453 08/12/11 0325 08/11/11 0513  HGB 13.4 13.4 --  HCT 42.4 40.5 41.0  PLT 393 368 349  APTT -- -- --  LABPROT 21.8* 24.3* 30.1*  INR 1.86* 2.14* 2.82*  HEPARINUNFRC -- -- --  CREATININE 5.77* 5.97* 6.10*  CKTOTAL 1324* 3226* 5644*  CKMB -- -- --  TROPONINI -- -- --    Estimated Creatinine Clearance: 9.8 ml/min (by C-G formula based on Cr of 5.77).  Assessment:  40 YOM with h/o DVTs on Coumadin PTA.  PTA dose was 4 mg/day (confirmed with wife).    INR now subtherapeutic after multiple days of lower warfarin doses due to previous INR >3   CBC wnl. No bleeding reported/documented.  Dysphagia diet.  DDI with Levaquin noted - potentially can increase INR.  Goal of Therapy:  INR 2-3 Monitor platelets by anticoagulation protocol: Yes   Plan:   Repeat Coumadin 3mg  today.  F/u daily PT/INR.  Darrol Angel, PharmD Pager: [phone] 08/13/2011 9:51 AM

## 2011-08-13 NOTE — Consult Note (Signed)
Physical Medicine and Rehabilitation Consult Reason for Consult: rhabdomyolysis, renal/respiratory failure, encephalopathy. Referring Physician:  Dr. Mahala Menghini   HPI: Mark Russell is a 76 y.o. male with history of DM, HTN, B-BKA who was found unresponsive by wife on 08/06/11 am. Patient noted to be febrile with leucocytosis and started on IV antibiotics for treatment.  Patient with acute renal failure due to rhabdomyolysis and treated with IVF with good urinary output. Did develop lethargy with hypercarbic respiratory failure requiring brief intubation. Transitioned to BIPAP and fluid overload treated with diuresis with improvement in respiratory status. Patient has continued with bouts of confusion but mentation improving. MD recommending CIR.  Review of Systems  Constitutional: Positive for malaise/fatigue.  Neurological: Positive for weakness.  Psychiatric/Behavioral: Positive for memory loss. The patient is nervous/anxious.   All other systems reviewed and are negative.   Past Medical History  Diagnosis Date  . Diabetes mellitus   . Hypertension   . GERD (gastroesophageal reflux disease)   . Anxiety   . Depression   . Peripheral vascular disease   . Neuropathy    Past Surgical History  Procedure Date  . Leg amputation through knee 06/2004  . Abdominal aortic aneurysm repair 1988  . Back surgery 1988, 1989   History reviewed. No pertinent family history.  Social History:  Married. Independent for transfer PTA. Per reports that he quit smoking about 16 years ago. His smoking use included Cigarettes. He has never used smokeless tobacco. Per  reports that he does not drink alcohol. His drug history not on file.   Allergies  Allergen Reactions  . Morphine And Related Other (See Comments)    "mean"   Medications Prior to Admission  Medication Sig Dispense Refill  . clonazePAM (KLONOPIN) 0.5 MG tablet Take 0.5 mg by mouth at bedtime as needed.      . diazepam (VALIUM) 10 MG  tablet Take 10 mg by mouth 3 (three) times daily as needed.      Marland Kitchen FLUoxetine (PROZAC) 10 MG tablet Take 10 mg by mouth daily.      Marland Kitchen gabapentin (NEURONTIN) 300 MG capsule Take 900 mg by mouth 3 (three) times daily.      Marland Kitchen glimepiride (AMARYL) 2 MG tablet Take 2 mg by mouth daily before breakfast.      . imipramine (TOFRANIL) 10 MG tablet Take 20 mg by mouth at bedtime.      Marland Kitchen losartan-hydrochlorothiazide (HYZAAR) 100-25 MG per tablet Take 1 tablet by mouth daily.      Marland Kitchen omeprazole (PRILOSEC) 20 MG capsule Take 40 mg by mouth 2 (two) times daily.      . simvastatin (ZOCOR) 40 MG tablet Take 40 mg by mouth every evening.        Home: Home Living Lives With: Spouse;Son (son at night) Available Help at Discharge: Family Type of Home: House Home Access: Ramped entrance Home Layout: One level Bathroom Shower/Tub: Engineer, manufacturing systems: Handicapped height Bathroom Accessibility: Yes Home Adaptive Equipment: Tub transfer bench;Wheelchair - powered Additional Comments: has regular bed.  Functional History: Prior Function Driving: Yes (modified) Functional Status:  Mobility:          ADL:    Cognition: Cognition Orientation Level: Oriented X4    Blood pressure 118/42, pulse 81, temperature 97.8 F (36.6 C), temperature source Axillary, resp. rate 19, height 4' (1.219 m), weight 120.8 kg (266 lb 5.1 oz), SpO2 94.00%. Physical Exam  Constitutional: He appears well-developed and well-nourished.       Obese.  Appears comfortable  HENT:  Head: Normocephalic and atraumatic.  Right Ear: External ear normal.  Left Ear: External ear normal.  Eyes: Conjunctivae and EOM are normal. Pupils are equal, round, and reactive to light.  Cardiovascular: Normal rate and regular rhythm.   Pulmonary/Chest: Effort normal and breath sounds normal.  Abdominal: Soft. Bowel sounds are normal.  Musculoskeletal:       Bilateral aka's  Neurological: He is alert. No cranial nerve deficit.    Reflex Scores:      Tricep reflexes are 2+ on the right side and 2+ on the left side.      Bicep reflexes are 2+ on the right side and 2+ on the left side.      Brachioradialis reflexes are 2+ on the right side and 2+ on the left side.      Pt oriented to place month, reason. Follows all simple commands. Is tangential, and sometimes he becomes disoriented. Strength is grossly 4/5 in the upper ext.     Results for orders placed during the hospital encounter of 08/06/11 (from the past 24 hour(s))  GLUCOSE, CAPILLARY     Status: Abnormal   Collection Time   08/12/11 11:34 AM      Component Value Range   Glucose-Capillary 152 (*) 70 - 99 mg/dL   Comment 1 Documented in Chart     Comment 2 Notify RN    GLUCOSE, CAPILLARY     Status: Abnormal   Collection Time   08/12/11  5:38 PM      Component Value Range   Glucose-Capillary 151 (*) 70 - 99 mg/dL  GLUCOSE, CAPILLARY     Status: Abnormal   Collection Time   08/12/11 11:45 PM      Component Value Range   Glucose-Capillary 160 (*) 70 - 99 mg/dL   Comment 1 Notify RN    PROTIME-INR     Status: Abnormal   Collection Time   08/13/11  4:53 AM      Component Value Range   Prothrombin Time 21.8 (*) 11.6 - 15.2 seconds   INR 1.86 (*) 0.00 - 1.49  CK     Status: Abnormal   Collection Time   08/13/11  4:53 AM      Component Value Range   Total CK 1324 (*) 7 - 232 U/L  BASIC METABOLIC PANEL     Status: Abnormal   Collection Time   08/13/11  4:53 AM      Component Value Range   Sodium 142  135 - 145 mEq/L   Potassium 3.7  3.5 - 5.1 mEq/L   Chloride 95 (*) 96 - 112 mEq/L   CO2 35 (*) 19 - 32 mEq/L   Glucose, Bld 141 (*) 70 - 99 mg/dL   BUN 70 (*) 6 - 23 mg/dL   Creatinine, Ser 1.61 (*) 0.50 - 1.35 mg/dL   Calcium 9.1  8.4 - 09.6 mg/dL   GFR calc non Af Amer 9 (*) >90 mL/min   GFR calc Af Amer 10 (*) >90 mL/min  CBC WITH DIFFERENTIAL     Status: Abnormal   Collection Time   08/13/11  4:53 AM      Component Value Range   WBC 10.4  4.0 - 10.5  K/uL   RBC 4.31  4.22 - 5.81 MIL/uL   Hemoglobin 13.4  13.0 - 17.0 g/dL   HCT 04.5  40.9 - 81.1 %   MCV 98.4  78.0 - 100.0 fL   MCH  31.1  26.0 - 34.0 pg   MCHC 31.6  30.0 - 36.0 g/dL   RDW 16.1  09.6 - 04.5 %   Platelets 393  150 - 400 K/uL   Neutrophils Relative 73  43 - 77 %   Neutro Abs 7.6  1.7 - 7.7 K/uL   Lymphocytes Relative 9 (*) 12 - 46 %   Lymphs Abs 1.0  0.7 - 4.0 K/uL   Monocytes Relative 11  3 - 12 %   Monocytes Absolute 1.1 (*) 0.1 - 1.0 K/uL   Eosinophils Relative 7 (*) 0 - 5 %   Eosinophils Absolute 0.7  0.0 - 0.7 K/uL   Basophils Relative 1  0 - 1 %   Basophils Absolute 0.1  0.0 - 0.1 K/uL   No results found.  Assessment/Plan: Diagnosis: rhabdomyolysis and deconditioning, with a history of bilateral aka's 1. Does the need for close, 24 hr/day medical supervision in concert with the patient's rehab needs make it unreasonable for this patient to be served in a less intensive setting? Yes 2. Co-Morbidities requiring supervision/potential complications: ARF, sepsis, acute respiratory failure 3. Due to bladder management, bowel management, safety, skin/wound care, disease management, medication administration, pain management and patient education, does the patient require 24 hr/day rehab nursing? Potentially 4. Does the patient require coordinated care of a physician, rehab nurse, PT (1-2 hrs/day, 5 days/week) and OT (1-2 hrs/day, 5 days/week) to address physical and functional deficits in the context of the above medical diagnosis(es)? Yes and Potentially Addressing deficits in the following areas: balance, endurance, locomotion, strength, transferring, bowel/bladder control, bathing, dressing, feeding, grooming, toileting, cognition and psychosocial support 5. Can the patient actively participate in an intensive therapy program of at least 3 hrs of therapy per day at least 5 days per week? Yes 6. The potential for patient to make measurable gains while on inpatient rehab  is good 7. Anticipated functional outcomes upon discharge from inpatient rehab are supervision to min assist at a wheelchair level with PT, supervision to min assist with OT, mod I with SLP. 8. Estimated rehab length of stay to reach the above functional goals is: 1-2 weeks 9. Does the patient have adequate social supports to accommodate these discharge functional goals? Yes 10. Anticipated D/C setting: Home 11. Anticipated post D/C treatments: HH therapy 12. Overall Rehab/Functional Prognosis: good  RECOMMENDATIONS: This patient's condition is appropriate for continued rehabilitative care in the following setting: CIR Patient has agreed to participate in recommended program. Yes (potentially) Note that insurance prior authorization may be required for reimbursement for recommended care.  Comment: I believe this patient would benefit from a relatively brief CIR admit to address truncal control, strength, transfers, etc. He has most of his equipment at home already.  I think he favors going home, but knows that he will need strengthening before he does so to reduce the level of care his family would otherwise have to provide.  Ivory Broad, MD    08/13/2011

## 2011-08-13 NOTE — Evaluation (Signed)
Physical Therapy Evaluation Patient Details Name: Mark Russell MRN: [id number] DOB: 1935-07-09 Today's Date: 08/13/2011 Time: 4098-1191 PT Time Calculation (min): 28 min  PT Assessment / Plan / Recommendation Clinical Impression  Pt. admitted w/ unresponsiveness, had other medical complications. Pt. has been in hospital x 1 week. Per daughter , pt. will need to be Mod. Independent in order to go home. Pt will need his equipment available in order to determine if pt. is strong enough to transfer after significant illness.    daughter to check on availability of WC and board to be brought to hospital. Pt will benefit from PT to improve in functional mobility and saferty to DC to next venue vs. Home.    PT Assessment  Patient needs continued PT services    Follow Up Recommendations  Inpatient Rehab (or STSNF if not a CIR candidate.)    Barriers to Discharge Decreased caregiver support      Equipment Recommendations  None recommended by PT    Recommendations for Other Services OT consult   Frequency Min 4X/week    Precautions / Restrictions Precautions Precautions: Fall Precaution Comments: bil AKA Restrictions Weight Bearing Restrictions: Yes   Pertinent Vitals/Pain Has phantom pain      Mobility  Bed Mobility Bed Mobility: Sit to Supine Supine to Sit: 3: Mod assist;Other (comment) Sit to Supine: 4: Min assist Details for Bed Mobility Assistance: Pt. fell straight back onto flat bed when he went to lie down.  Pt was not able to sit up from flat HOB. pt states he has a trapeze over his bed.  He has a regular bed. Transfers Details for Transfer Assistance: sprays silicon onto board.    Exercises     PT Diagnosis: Generalized weakness  PT Problem List: Decreased strength;Decreased activity tolerance;Decreased balance;Decreased mobility;Decreased safety awareness PT Treatment Interventions: Functional mobility training;Therapeutic activities;Patient/family education   PT  Goals Acute Rehab PT Goals PT Goal Formulation: With patient/family Time For Goal Achievement: 08/27/11 Potential to Achieve Goals: Good Pt will go Supine/Side to Sit: with modified independence PT Goal: Supine/Side to Sit - Progress: Goal set today Pt will go Sit to Supine/Side: with modified independence PT Goal: Sit to Supine/Side - Progress: Goal set today Pt will Transfer Bed to Chair/Chair to Bed: with supervision (w/ sliding board) PT Transfer Goal: Bed to Chair/Chair to Bed - Progress: Goal set today  Visit Information  Last PT Received On: 08/13/11 Assistance Needed: +2 PT/OT Co-Evaluation/Treatment: Yes    Subjective Data  Subjective: I can do my transfers  Patient Stated Goal: to go home   Prior Functioning  Home Living Lives With: Spouse;Son Available Help at Discharge: Family Type of Home: House Home Access: Ramped entrance Home Layout: One level Bathroom Shower/Tub: Walk-in shower (WC accessible) Firefighter: Handicapped height Bathroom Accessibility: Yes How Accessible: Accessible via wheelchair Home Adaptive Equipment: Tub transfer bench Additional Comments: overhead bar over bed, Prior Function Level of Independence: Independent with assistive device(s) Driving: Yes Vocation: Retired Comments: pt and daughter report that pt  was modified Independent, mowed grass, drives, has modified bathroom. Communication Communication: No difficulties    Cognition  Overall Cognitive Status: Appears within functional limits for tasks assessed/performed Arousal/Alertness: Awake/alert Orientation Level: Disoriented to;Time Behavior During Session: [facility] for tasks performed    Extremity/Trunk Assessment Right Upper Extremity Assessment RUE ROM/Strength/Tone: Within functional levels (strength 4/5; doesn't want theraband) Left Upper Extremity Assessment LUE ROM/Strength/Tone: Within functional levels (strength 5/5) Right Lower Extremity Assessment RLE  ROM/Strength/Tone: Deficits;WFL for tasks  assessed RLE ROM/Strength/Tone Deficits: Hi AKA, ROM is WFL residual limb. Left Lower Extremity Assessment LLE ROM/Strength/Tone: Deficits LLE ROM/Strength/Tone Deficits: residual limb is [facility]   Balance Balance Balance Assessed: Yes Static Sitting Balance Static Sitting - Balance Support: Bilateral upper extremity supported Static Sitting - Level of Assistance: 5: Stand by assistance Static Sitting - Comment/# of Minutes: does lose balance if not using UEs  End of Session PT - End of Session Activity Tolerance: Patient tolerated treatment well Patient left: in bed;with call bell/phone within reach;with family/visitor present Nurse Communication: Mobility status  GP     Rada Hay 08/13/2011, 1:13 PM  [phone]

## 2011-08-13 NOTE — Progress Notes (Signed)
Clinical Social Work Department BRIEF PSYCHOSOCIAL ASSESSMENT 08/13/2011  Patient:  Mark Russell, Mark Russell     Account Number:  [id number]     Admit date:  08/06/2011  Clinical Social Worker:  Doroteo Glassman  Date/Time:  08/13/2011 09:51 AM  Referred by:  Physician  Date Referred:  08/13/2011 Referred for  SNF Placement   Other Referral:   Interview type:  Patient Other interview type:   Pt's daughter present.    PSYCHOSOCIAL DATA Living Status:  WIFE Admitted from facility:   Level of care:   Primary support name:  Braylyn Kalter Primary support relationship to patient:  SPOUSE Degree of support available:   Strong    CURRENT CONCERNS Current Concerns  Post-Acute Placement   Other Concerns:    SOCIAL WORK ASSESSMENT / PLAN CSW met with Pt and daughter to discuss d/c plans.    Pt expressed his strong desire to go home upon d/c.  Pt's daughter cautioned Pt that he cannot go home unless he's able to transfer himself into his wheelchair (Pt is wheelchair bound).  Pt stated that he understood but that he felt as though he's strong enough now and that he wants to go home.    CSW answered SNF questions.  CSW to provide Pt/daughter with [facility] list.    PT/OT to see Pt today and Pt agreed to adhere to their recommendations.    CSW thanked Pt and his daughter for their time.   Assessment/plan status:  Psychosocial Support/Ongoing Assessment of Needs Other assessment/ plan:   Information/referral to community resources:   [facility] SNF list.    PATIENT'S/FAMILY'S RESPONSE TO PLAN OF CARE: Pt and daughter thanked CSW for time and assistance.  CSW to continue to follow.  Providence Crosby, LCSWA Clinical Social Work [phone]

## 2011-08-13 NOTE — Progress Notes (Signed)
Subjective:  He looks really good today, more alert.  Creatinine down again.  Good UOP off lasix.  Objective Vital signs in last 24 hours: Filed Vitals:   08/12/11 2239 08/13/11 0625 08/13/11 0710 08/13/11 0836  BP: 142/63 152/38 118/42   Pulse: 87 81    Temp: 98.4 F (36.9 C) 97.8 F (36.6 C)    TempSrc: Oral Axillary    Resp: 18 19    Height:      Weight:      SpO2: 95% 94%  94%   Weight change:   Intake/Output Summary (Last 24 hours) at 08/13/11 1124 Last data filed at 08/12/11 2251  Gross per 24 hour  Intake    360 ml  Output   1000 ml  Net   -640 ml   Labs: Basic Metabolic Panel:  Lab 08/13/11 1191 08/12/11 0325 08/11/11 0513 08/10/11 0407 08/09/11 1759 08/09/11 0910 08/08/11 0643  NA 142 138 142 137 136 136 137  K 3.7 3.2* 3.6 4.4 3.9 4.9 3.6  CL 95* 91* 96 96 97 98 97  CO2 35* 30 32 30 26 25 25   GLUCOSE 141* 141* 130* 137* 123* 136* 152*  BUN 70* 61* 52* 40* 38* 36* 31*  CREATININE 5.77* 5.97* 6.10* 5.61* 5.30* 4.92* 4.20*  ALB -- -- -- -- -- -- --  CALCIUM 9.1 8.7 8.7 8.5 8.2* 8.4 7.9*  PHOS -- -- -- 7.0* -- -- --   Liver Function Tests:  Lab 08/07/11 0608  AST 378*  ALT 38  ALKPHOS 51  BILITOT 0.4  PROT 7.1  ALBUMIN 3.1*   No results found for this basename: LIPASE:3,AMYLASE:3 in the last 168 hours No results found for this basename: AMMONIA:3 in the last 168 hours CBC:  Lab 08/13/11 0453 08/12/11 0325 08/11/11 0513 08/10/11 0407  WBC 10.4 11.2* 12.8* 12.3*  NEUTROABS 7.6 -- -- --  HGB 13.4 13.4 13.1 12.7*  HCT 42.4 40.5 41.0 39.3  MCV 98.4 95.7 98.3 98.0  PLT 393 368 349 291   PT/INR: @labrcntip (inr:5) Cardiac Enzymes:  Lab 08/13/11 0453 08/12/11 0325 08/11/11 0513 08/10/11 0407 08/08/11 0645  CKTOTAL 1324* 3226* 5644* 8428* 13318*  CKMB -- -- -- -- --  CKMBINDEX -- -- -- -- --  TROPONINI -- -- -- -- --   CBG:  Lab 08/12/11 2345 08/12/11 1738 08/12/11 1134 08/12/11 0752 08/11/11 2136  GLUCAP 160* 151* 152* 142* 162*    Iron  Studies: No results found for this basename: IRON:30,TIBC:30,TRANSFERRIN:30,FERRITIN:30 in the last 168 hours  Physical Exam:  Blood pressure 118/42, pulse 81, temperature 97.8 F (36.6 C), temperature source Axillary, resp. rate 19, height 4' (1.219 m), weight 120.8 kg (266 lb 5.1 oz), SpO2 94.00%.   Gen: alert, obese, NAD- talkative Skin: no rash, cyanosis  Neck: no JVD, no bruits or LAN  Chest: clear bilat Heart: regular, no rub or gallop, no murmur  Abdomen: soft, obese, +BS hyperactive  Ext: bilat AKA no edema or ulceration  Neuro: alert, Ox3, no focal deficit     CPK- down to 1300 today   Impression/Plan  1. AKI due to ATN from rhabdomyolysis- creat now trending down albeit slowly.  BUN is lagging behind as expected. . Nonoliguric even with no diuretics.   CK has improved.  Since renal function normal at baseline, hopeful for recovery. No indications for HD and according to discussion with other MDs does not want? Did not confirm but will keep in mind.  Thankfully no indications to  date.  2. AMS- prob medication induced, valium and/or neurontin. Rec'd romazicon w good response.  Much better today  3. Pulm edema - cxr was wet, better now.  Holding lasix and to follow 4. Fever / ^ WBC- on empiric abx with levaquin now. Vanc/zosyn stopped. UCx neg.  Per CCM 5. DM 2- per primary 6. Bilat AKA 7. Obesity 8. Hypokalemia- will re order repletion and follow 9. Anemia- not an issue so far 10. Dispo- probably need at least 2 more days of creatinine trending down prior to discharge.  Not sure if will be going home or to facility, will depend on his abilities and family willing to care for him.    Krystn Dermody A l 08/13/2011, 11:24 AM

## 2011-08-13 NOTE — Progress Notes (Signed)
Occupational Therapy Evaluation Patient Details Name: Mark Russell MRN: [id number] DOB: 10-26-1935 Today's Date: 08/13/2011 Time: 4098-1191 OT Time Calculation (min): 25 min  OT Assessment / Plan / Recommendation Clinical Impression  This 76 year old man was admitted with hypoxia, slumped in w/c.  He has h/o Bil AKA and is modified independent with adls.  Will follow pt in acute setting to make sure that he can perform adls at this level.      OT Assessment  Patient needs continued OT Services    Follow Up Recommendations  If pt mod I, none; if not will need CIR vs. STSNF    Barriers to Discharge Decreased caregiver support pt must be mod I to return home  Equipment Recommendations  None recommended by OT    Recommendations for Other Services    Frequency  Min 2X/week    Precautions / Restrictions Precautions Precautions: Fall Precaution Comments: bil AKA Restrictions Weight Bearing Restrictions: Yes   Pertinent Vitals/Pain No pain    ADL  Transfers/Ambulation Related to ADLs: sat in bed and used arms to laterally scoot.  Uses a transfer board with silicone spray or towel on top for transfer to commode.   ADL Comments: Pt is set up for adls, hob up and rolling/weightshifting.  Able to sit unsupported (long sit)    OT Diagnosis: Generalized weakness  OT Problem List: Decreased strength;Decreased activity tolerance;Impaired balance (sitting and/or standing) OT Treatment Interventions: Self-care/ADL training;Patient/family education   OT Goals Acute Rehab OT Goals OT Goal Formulation: With patient Time For Goal Achievement: 08/20/11 Potential to Achieve Goals: Good ADL Goals Pt Will Transfer to Toilet: with supervision;with transfer board (from w/c and perform hygiene clothes management at this level) ADL Goal: Toilet Transfer - Progress: Goal set today Miscellaneous OT Goals Miscellaneous OT Goal #1: Bed will be mod I  with bed mobility (trapeze/flat bed) in prep for  toilet transfers  Visit Information  Last OT Received On: 08/13/11 Assistance Needed: +2 PT/OT Co-Evaluation/Treatment: Yes    Subjective Data  Subjective: I know I'll be OK at home Patient Stated Goal: home; does everything including riding mower for 2 acres   Prior Functioning  Vision/Perception  Home Living Lives With: Spouse;Son (son at night) Available Help at Discharge: Family Type of Home: House Home Access: Ramped entrance Home Layout: One level Bathroom Shower/Tub: Walk-in shower (roll in) Bathroom Toilet: Handicapped height Bathroom Accessibility: Yes Home Adaptive Equipment: Tub transfer bench;Wheelchair - powered Additional Comments: overhead bar over bed, Prior Function Level of Independence: Independent with assistive device(s) Driving: Yes (modified) Vocation: Retired Comments: pt and daughter report that pt  was modified Independent, mowed grass, drives, has modified bathroom. Communication Communication: No difficulties      Cognition  Overall Cognitive Status: Appears within functional limits for tasks assessed/performed Arousal/Alertness: Awake/alert Orientation Level: Appears intact for tasks assessed Behavior During Session: [facility] for tasks performed    Extremity/Trunk Assessment Right Upper Extremity Assessment RUE ROM/Strength/Tone: Within functional levels (strength 4/5; doesn't want theraband) Left Upper Extremity Assessment LUE ROM/Strength/Tone: Within functional levels (strength 5/5)   Mobility Bed Mobility Bed Mobility: Supine to Sit Supine to Sit: HOB elevated;With rails;3: Mod assist Transfers Details for Transfer Assistance: uses sliding board; supervision to laterally scoot in bed   Exercise    Balance Balance Balance Assessed: Yes Static Sitting Balance Static Sitting - Balance Support: Bilateral upper extremity supported Static Sitting - Level of Assistance: 5: Stand by assistance  End of Session OT - End of Session Activity  Tolerance: Patient tolerated treatment well Patient left: in bed;with call bell/phone within reach  GO     [name] 08/13/2011, 1:00 PM Marica Otter, OTR/L [phone] 08/13/2011

## 2011-08-14 ENCOUNTER — Inpatient Hospital Stay (HOSPITAL_COMMUNITY): Payer: Medicare Other

## 2011-08-14 LAB — GLUCOSE, CAPILLARY
Glucose-Capillary: 142 mg/dL — ABNORMAL HIGH (ref 70–99)
Glucose-Capillary: 144 mg/dL — ABNORMAL HIGH (ref 70–99)
Glucose-Capillary: 152 mg/dL — ABNORMAL HIGH (ref 70–99)
Glucose-Capillary: 138 mg/dL — ABNORMAL HIGH (ref 70–99)
Glucose-Capillary: 145 mg/dL — ABNORMAL HIGH (ref 70–99)
Glucose-Capillary: 133 mg/dL — ABNORMAL HIGH (ref 70–99)
Glucose-Capillary: 134 mg/dL — ABNORMAL HIGH (ref 70–99)
Glucose-Capillary: 160 mg/dL — ABNORMAL HIGH (ref 70–99)

## 2011-08-14 LAB — BASIC METABOLIC PANEL
BUN: 76 mg/dL — ABNORMAL HIGH (ref 6–23)
CO2: 33 mEq/L — ABNORMAL HIGH (ref 19–32)
Calcium: 9 mg/dL (ref 8.4–10.5)
Chloride: 93 mEq/L — ABNORMAL LOW (ref 96–112)
Creatinine, Ser: 5.18 mg/dL — ABNORMAL HIGH (ref 0.50–1.35)
GFR calc Af Amer: 11 mL/min — ABNORMAL LOW (ref >90)
GFR calc non Af Amer: 10 mL/min — ABNORMAL LOW (ref >90)
Glucose, Bld: 146 mg/dL — ABNORMAL HIGH (ref 70–99)
Potassium: 3.6 mEq/L (ref 3.5–5.1)
Sodium: 139 mEq/L (ref 135–145)

## 2011-08-14 LAB — CK: Total CK: 588 U/L — ABNORMAL HIGH (ref 7–232)

## 2011-08-14 LAB — PROTIME-INR
INR: 1.93 — ABNORMAL HIGH (ref 0.00–1.49)
Prothrombin Time: 22.4 seconds — ABNORMAL HIGH (ref 11.6–15.2)

## 2011-08-14 MED ORDER — WARFARIN SODIUM 3 MG PO TABS
3.0000 mg | ORAL_TABLET | Freq: Once | ORAL | Status: AC
  Administered 2011-08-14: 3 mg via ORAL
  Filled 2011-08-14: qty 1

## 2011-08-14 NOTE — Progress Notes (Signed)
Pt still having bigeminy on tele. Pt is asymptomatic. He is resting comfortably in bed. A&Ox3 with no complaints in NAD. Prior notes illustrate the same and that providers are aware. Will notify upon rounds today and continue to monitor for any acute changes.

## 2011-08-14 NOTE — Progress Notes (Signed)
PROGRESS NOTE  Mark Russell XBM:[id number] DOB: [birth year] DOA: 08/06/2011  PCP: Astrid Divine, MD  Brief narrative:  [name] is a 76/M with history of diabetes,PVD/AKAs, history of DVTs on Coumadin, was found slumped over in his wheelchair by his wife 08/06/11.  He was found to be febrile, and diaphoretic at home and subsequently brought to to the ER by EMS .  He complained of cough, occasionally productive of white sputum with wheezing off and on for 2 weeks, had a chest x-ray done about 10 days ago by his PCP which was reportedly normal.  Subsequently admitted w/ sepsis suspected to be from a pulmonary source, started on Empiric antibiotics clinically improved, But by day 2, his creatinine started climbing, was seen by Renal in consultation on 8/2, His ARF was felt to be from Rhabdomyolysis and sepsis.  In addition on 8/4 he was noted to be in acute hypercarbic resp failure from meds/pulm edema/OSA, started on Bipap, also seen by Pulmonary in consultation. Then started on diuretics per Renal and respiratory statu has started to improve, now off bipap-he was transferred back to [facility] 08/12/11 and did not have any recurrence of CO2 narcosis  Past medical history-As per Problem list  Chart reviewed as below-  Admit 07/01/04 c RLE gangrene + R AKA  Htn  Anxiety  Previous bypass surgery  AAA repair in 1988  Htn  ETOH abuse   Consultants:  Pulmonary  Renal  Procedures:  Chest x-ray two-view 08/06/2011 = stable exam, mild chronic bilateral peribronchial thickening, can be seen in setting of acute or chronic bronchitis, no definite airspace disease  Chest x-ray two-view 08/11/2011 = grossly unchanged findings suggestive of pulmonary edema  Antibiotics:  Zosyn 8/1--8/2  Vancomycin 8/1--8/2  Levaquin 8/3 >>>8/10   Subjective   Looking much better today.  Producing some sputum however No chest pain, no shortness of breath, nausea no vomiting no blurred vision no double vision No  weakness Seems to be a baseline per daughter   Objective   Interim History:  Nursing reports no further agitation last night  Telemetry:  Sinus rhythm with occasional bigeminy  Objective:  Filed Vitals:   08/14/11 0527 08/14/11 0934 08/14/11 1431 08/14/11 1433  BP: 138/73   111/53  Pulse: 98   66  Temp: 98.4 F (36.9 C)   98.2 F (36.8 C)  TempSrc: Oral   Oral  Resp: 19   20  Height:      Weight:      SpO2: 97% 98% 94% 93%    Exam:  General: Obese alert oriented x1 Caucasian male  Cardiovascular: S1-S2 no murmur rub or gallop  Respiratory: Fine wheezes to the left posterior base to regions of the lungs  Abdomen: Soft nontender nondistended no rebound or guarding midline scar noted  Skin patient has bilateral BKA's  Neuro slightly somnolent from Ativan and therefore not able to participate fully on examination   Data Reviewed:  Basic Metabolic Panel:   Lab  08/12/11 0325  08/11/11 0513  08/10/11 0407  08/09/11 1759  08/09/11 0910   NA  138  142  137  136  136   K  3.2*  3.6  --  --  --   CL  91*  96  96  97  98   CO2  30  32  30  26  25    GLUCOSE  141*  130*  137*  123*  136*   BUN  61*  52*  40*  38*  36*   CREATININE  5.97*  6.10*  5.61*  5.30*  4.92*   CALCIUM  8.7  8.7  8.5  8.2*  8.4   MG  --  --  2.2  --  --   PHOS  --  --  7.0*  --  --    Liver Function Tests:   Lab  08/07/11 0608  08/06/11 1000   AST  378*  19   ALT  38  13   ALKPHOS  51  65   BILITOT  0.4  0.4   PROT  7.1  8.1   ALBUMIN  3.1*  3.8    No results found for this basename: LIPASE:5,AMYLASE:5 in the last 168 hours  No results found for this basename: AMMONIA:5 in the last 168 hours  CBC:   Lab  08/12/11 0325  08/11/11 0513  08/10/11 0407  08/08/11 0643  08/07/11 0608  08/06/11 1000   WBC  11.2*  12.8*  12.3*  14.4*  19.7*  --   NEUTROABS  --  --  --  --  --  20.9*   HGB  13.4  13.1  12.7*  12.1*  14.0  --   HCT  40.5  41.0  39.3  37.5*  42.6  --   MCV  95.7  98.3  98.0  96.6   96.6  --   PLT  368  349  291  274  311  --    Cardiac Enzymes:   Lab  08/12/11 0325  08/11/11 0513  08/10/11 0407  08/08/11 0645   CKTOTAL  3226*  5644*  8428*  13318*   CKMB  --  --  --  --   CKMBINDEX  --  --  --  --   TROPONINI  --  --  --  --    BNP:  No components found with this basename: POCBNP:5  CBG:   Lab  08/11/11 2136  08/11/11 1609  08/11/11 1137  08/11/11 0756  08/11/11 0428   GLUCAP  162*  169*  156*  113*  131*    Recent Results (from the past 240 hour(s))   CULTURE, BLOOD (ROUTINE X 2) Status: Normal (Preliminary result)    Collection Time    08/06/11 10:00 AM   Component  Value  Range  Status  Comment    Specimen Description  BLOOD LEFT HAND   Final     Special Requests  BOTTLES DRAWN AEROBIC AND ANAEROBIC [facility] EACH   Final     Culture Setup Time  08/06/2011 12:54   Final     Culture    Final     Value:  BLOOD CULTURE RECEIVED NO GROWTH TO DATE CULTURE WILL BE HELD FOR 5 DAYS BEFORE ISSUING A FINAL NEGATIVE REPORT    Report Status  PENDING   Incomplete    CULTURE, BLOOD (ROUTINE X 2) Status: Normal (Preliminary result)    Collection Time    08/06/11 11:32 AM   Component  Value  Range  Status  Comment    Specimen Description  BLOOD LEFT ARM   Final     Special Requests  BOTTLES DRAWN AEROBIC AND ANAEROBIC 3CC EACH   Final     Culture Setup Time  08/06/2011 23:11   Final     Culture    Final     Value:  BLOOD CULTURE RECEIVED NO GROWTH TO DATE CULTURE WILL BE HELD FOR 5 DAYS BEFORE ISSUING  A FINAL NEGATIVE REPORT    Report Status  PENDING   Incomplete    URINE CULTURE Status: Normal    Collection Time    08/06/11 11:57 AM   Component  Value  Range  Status  Comment    Specimen Description  URINE, CATHETERIZED   Final     Special Requests  NONE   Final     Culture Setup Time  08/06/2011 19:58   Final     Colony Count  NO GROWTH   Final     Culture  NO GROWTH   Final     Report Status  08/07/2011 FINAL   Final    MRSA PCR SCREENING Status: Normal    Collection  Time    08/06/11 3:45 PM   Component  Value  Range  Status  Comment    MRSA by PCR  NEGATIVE  NEGATIVE  Final     Studies:  All Imaging reviewed and is as per above notation   Scheduled Meds:  .  antiseptic oral rinse  15 mL  Mouth Rinse  q12n4p   .  chlorhexidine  15 mL  Mouth Rinse  BID   .  furosemide  80 mg  Intravenous  Q12H   .  insulin aspart  0-9 Units  Subcutaneous  TID WC   .  ipratropium  0.5 mg  Nebulization  Q6H   .  levalbuterol  0.63 mg  Nebulization  Q6H   .  levofloxacin (LEVAQUIN) IV  500 mg  Intravenous  Q48H   .  pantoprazole (PROTONIX) IV  40 mg  Intravenous  QHS   .  potassium chloride  40 mEq  Oral  Once   .  sodium chloride  3 mL  Intravenous  Q12H   .  warfarin  0.5 mg  Oral  ONCE-1800   .  Warfarin - Pharmacist Dosing Inpatient   Does not apply  q1800   .  DISCONTD: furosemide  160 mg  Intravenous  Q12H   .  DISCONTD: potassium chloride  10 mEq  Intravenous  Q1 Hr x 4    Continuous Infusions:  Assessment/Plan:  1. Sepsis -improved  Urine cultures and blood cultures negative  Congestion and cough, raising concern for bronchitis vs early pneumonia as source although CXR was normal on 8/1, repeat 2 view CXR 8/3 with peribronchial thickening only, CXR 8/4 w/ RUL consolidation  Dced IV Vancomycin and Zosyn 8/3, transitioned to levaquin 8/3, continue through 8/10-patient changed to by mouth Levaquin 08/12/2011 to complete course 8/10 Repeat Lactic acid normalized   2. Toxic metabolic encephalopathy  Secondary to sepsis had completely resolved by Day 1 of admission  Worsened (8/4) due to CO2 narcosis, meds  Flumazenil given x1 8/4  Mentation improving-seems to be a baseline Would limit narcotics and benzodiazepines as an outpatient-will inform Dr. Valentina Lucks about this Tolerating full diet now-graduate to full diabetic diet  3. Acute Hypercarbic respiratory failure  With acute change in mental status 8/4  With improvement since 8/4 night, more alert and  interactive per family  Suspect multifactorial, drug induced from decreased renal clearing(worsening GFR) of sedatives/Benzos/Gabapentin, Pulmonary edema and OSA  Repeat CXR 08/11/2011 shows persistent pulmonary edema  Off Bipap since last pm, last ABG with improvement in resp acidosis-patient has a pickwickian habitus and will need BiPAP at night and formal sleep study as an outpatient-he refused to use BiPAP 8/713 All sedating home meds stopped  PCCM consult appreciated, DNI  4. ARF: ATN secondary to Rhabdomyolysis/ Sepsis  Creatinine improving substantially, Urine output good I/O last 3 completed shifts: In: 240 [P.O.:240] Out: 2300 [Urine:2300] renal USG normal  Vanc level-random was ok, IVF Dced 8/4 am, diuretics started and subsequently discontinued by nephrologist  5. Lactic acidosis: on admission, resolved in 24H   6. H/o DVT: continue coumadin per pharmacy- Lab Results  Component Value Date   INR 1.93* 08/14/2011   INR 1.86* 08/13/2011   INR 2.14* 08/12/2011   7. PVD S/P Bilateral AKA-still complains of phantom pain.  Will be difficult to treat this with only gabapentin however the risks of sedation and confusion with opiates is to great.  Family aware-I have restarted his gabapentin and his Prozac 08/13/2011 at home dosages of 900 3 times a day and 10 mg daily-discontinued all benzodiazepines in his specific case, discontinue opiates  8. DM: hold amaryl, SSI change to [facility]-CBG's stable.  Continue sensitive sliding scale coverage  Code status: was changed to DNR 8/4 after discussion between PCCM and family, They do not want Intubation and hemodialysis at this point.  I had a detailed discussion with [name] the patient's daughter as well as his wife at bedside with regards to course of care and ultimate disposition-PT OT as seen the patient and patient is potentially dischargeable home tomorrow 8/10 2030  Patient stable for transfer to general telemetry Updated nursing about  holding any narcs/BZD's and let me know  Pleas Koch, MD  [facility]  Pager [phone]  08/12/2011, 7:55 AM  LOS: 6 days

## 2011-08-14 NOTE — Progress Notes (Signed)
PHYSICAL THERAPY NOTE.  Pt was assisted  OOB via lift by nursing. Pt. Now plans to DC home. Spoke w/ family re getting a lift for home if they feel they  Need it.. Recommend HHPT Blanchard Kelch PT [phone]

## 2011-08-14 NOTE — Progress Notes (Signed)
Subjective: Disposition planning has been main issue.  Creatinine down again.  Good UOP off lasix.  Objective Vital signs in last 24 hours: Filed Vitals:   08/13/11 2135 08/14/11 0032 08/14/11 0527 08/14/11 0934  BP: 118/50 126/61 138/73   Pulse: 66 86 98   Temp: 98.2 F (36.8 C) 98.4 F (36.9 C) 98.4 F (36.9 C)   TempSrc: Oral Oral Oral   Resp: 19 19 19    Height:      Weight:      SpO2: 97% 98% 97% 98%   Weight change:   Intake/Output Summary (Last 24 hours) at 08/14/11 1055 Last data filed at 08/14/11 0900  Gross per 24 hour  Intake    720 ml  Output   2550 ml  Net  -1830 ml   Labs: Basic Metabolic Panel:  Lab 08/14/11 4540 08/13/11 0453 08/12/11 0325 08/11/11 0513 08/10/11 0407 08/09/11 1759 08/09/11 0910  NA 139 142 138 142 137 136 136  K 3.6 3.7 3.2* 3.6 4.4 3.9 4.9  CL 93* 95* 91* 96 96 97 98  CO2 33* 35* 30 32 30 26 25   GLUCOSE 146* 141* 141* 130* 137* 123* 136*  BUN 76* 70* 61* 52* 40* 38* 36*  CREATININE 5.18* 5.77* 5.97* 6.10* 5.61* 5.30* 4.92*  ALB -- -- -- -- -- -- --  CALCIUM 9.0 9.1 8.7 8.7 8.5 8.2* 8.4  PHOS -- -- -- -- 7.0* -- --   Liver Function Tests: No results found for this basename: AST:3,ALT:3,ALKPHOS:3,BILITOT:3,PROT:3,ALBUMIN:3 in the last 168 hours No results found for this basename: LIPASE:3,AMYLASE:3 in the last 168 hours No results found for this basename: AMMONIA:3 in the last 168 hours CBC:  Lab 08/13/11 0453 08/12/11 0325 08/11/11 0513 08/10/11 0407  WBC 10.4 11.2* 12.8* 12.3*  NEUTROABS 7.6 -- -- --  HGB 13.4 13.4 13.1 12.7*  HCT 42.4 40.5 41.0 39.3  MCV 98.4 95.7 98.3 98.0  PLT 393 368 349 291   PT/INR: @labrcntip (inr:5) Cardiac Enzymes:  Lab 08/14/11 0503 08/13/11 0453 08/12/11 0325 08/11/11 0513 08/10/11 0407  CKTOTAL 588* 1324* 3226* 5644* 8428*  CKMB -- -- -- -- --  CKMBINDEX -- -- -- -- --  TROPONINI -- -- -- -- --   CBG:  Lab 08/14/11 0736 08/13/11 2138 08/13/11 1707 08/13/11 1109 08/13/11 0731  GLUCAP 145*  138* 152* 144* 142*    Iron Studies: No results found for this basename: IRON:30,TIBC:30,TRANSFERRIN:30,FERRITIN:30 in the last 168 hours  Physical Exam:  Blood pressure 138/73, pulse 98, temperature 98.4 F (36.9 C), temperature source Oral, resp. rate 19, height 4' (1.219 m), weight 120.8 kg (266 lb 5.1 oz), SpO2 98.00%.   Gen: alert, obese, NAD- talkative Skin: no rash, cyanosis  Neck: no JVD, no bruits or LAN  Chest: clear bilat Heart: regular, no rub or gallop, no murmur  Abdomen: soft, obese, +BS hyperactive  Ext: bilat AKA no edema or ulceration  Neuro: alert, Ox3, no focal deficit     CPK- down to 588 today- probably do not need to continue to check daily   Impression/Plan  1. AKI due to ATN from rhabdomyolysis- creat now trending down albeit slowly.  BUN is lagging behind as expected. . Nonoliguric even with no diuretics.   CK has improved.  Since renal function normal at baseline, hopeful for continued recovery. No indications for HD and according to discussion with other MDs does not want? Did not confirm but will keep in mind.  Thankfully no indications to date.  2. AMS- prob medication induced, valium and/or neurontin. Rec'd romazicon w good response.  Much better today  3. Pulm edema - cxr was wet, better now.  Holding lasix and to follow 4. Fever / ^ WBC- on empiric abx with levaquin now. Vanc/zosyn stopped. UCx neg.  Per CCM 5. DM 2- per primary 6. Bilat AKA 7. Obesity 8. Hypokalemia- will re order repletion and follow 9. Anemia- not an issue so far 10. Dispo- probably need at least 1 more days of creatinine trending down prior to discharge.  Not sure if will be going home or to facility vs CIR will depend on his abilities and family willing to care for him. If leaves the hospital will need chemistries 2 times per week to continue to reassess renal recovery. I dont think would need automatic renal follow up, would just need if doesn't continue to recover.     Mark Russell A l 08/14/2011, 10:55 AM

## 2011-08-14 NOTE — Progress Notes (Signed)
Page returned by T. Willia Craze. No orders received at this time.

## 2011-08-14 NOTE — Progress Notes (Signed)
Discussed MD and PT's recommendation of SNF, as CIR declined Pt.  Pt adamant that he will be going home upon d/c and insists that he can transfer safely.  Pt amenable to HHPT.  CSW notified RNCM.  No additional needs identified.  CSW to sign off.  Providence Crosby, LCSWA Clinical Social Work [phone]

## 2011-08-14 NOTE — Progress Notes (Signed)
Clarified with physician the need for continued IV access as pt is not getting any meds through it currently, it is due for rotation and pt has limited, poor venous access. Orders received.

## 2011-08-14 NOTE — Progress Notes (Signed)
Pt transferred to chair via lift with no problems. Pt currently sitting up in chair visiting with family.

## 2011-08-14 NOTE — Progress Notes (Signed)
ANTICOAGULATION CONSULT NOTE - Follow Up  Pharmacy Consult for Coumadin Indication: h/o DVTs  Allergies  Allergen Reactions  . Morphine And Related Other (See Comments)    "mean"    Patient Measurements: Height: 4' (121.9 cm) Weight: 266 lb 5.1 oz (120.8 kg) IBW/kg (Calculated) : 22.4   Vital Signs: Temp: 98.4 F (36.9 C) (08/09 0527) Temp src: Oral (08/09 0527) BP: 138/73 mmHg (08/09 0527) Pulse Rate: 98  (08/09 0527)  Labs:  Basename 08/14/11 0503 08/13/11 0453 08/12/11 0325  HGB -- 13.4 13.4  HCT -- 42.4 40.5  PLT -- 393 368  APTT -- -- --  LABPROT 22.4* 21.8* 24.3*  INR 1.93* 1.86* 2.14*  HEPARINUNFRC -- -- --  CREATININE 5.18* 5.77* 5.97*  CKTOTAL 588* 1324* 3226*  CKMB -- -- --  TROPONINI -- -- --    Estimated Creatinine Clearance: 10.9 ml/min (by C-G formula based on Cr of 5.18).  Assessment:  78 YOM with h/o DVTs on Coumadin PTA.  PTA dose was 4 mg/day (confirmed with wife).    INR slightly subtherapeutic (increasing now after holding due to supratherapeutic levels)  No bleeding reported/documented.  Dysphagia diet.  DDI with Levaquin noted - potentially can increase INR.  Goal of Therapy:  INR 2-3 Monitor platelets by anticoagulation protocol: Yes   Plan:   Repeat warfarin 3mg  today.  F/u daily PT/INR.   Lynann Beaver PharmD, BCPS Pager [phone] 08/14/2011 11:11 AM

## 2011-08-14 NOTE — Progress Notes (Signed)
Paged T. Callahan,NP to notify that pt was having bigeminy. Pt is resting in bed with no complaints. VS are stable.

## 2011-08-15 LAB — CK: Total CK: 267 U/L — ABNORMAL HIGH (ref 7–232)

## 2011-08-15 LAB — BASIC METABOLIC PANEL
BUN: 79 mg/dL — ABNORMAL HIGH (ref 6–23)
CO2: 33 mEq/L — ABNORMAL HIGH (ref 19–32)
Calcium: 9.1 mg/dL (ref 8.4–10.5)
Chloride: 95 mEq/L — ABNORMAL LOW (ref 96–112)
Creatinine, Ser: 4.46 mg/dL — ABNORMAL HIGH (ref 0.50–1.35)
GFR calc Af Amer: 14 mL/min — ABNORMAL LOW (ref >90)
GFR calc non Af Amer: 12 mL/min — ABNORMAL LOW (ref >90)
Glucose, Bld: 153 mg/dL — ABNORMAL HIGH (ref 70–99)
Potassium: 3.7 mEq/L (ref 3.5–5.1)
Sodium: 139 mEq/L (ref 135–145)

## 2011-08-15 LAB — PROTIME-INR
INR: 1.96 — ABNORMAL HIGH (ref 0.00–1.49)
Prothrombin Time: 22.7 seconds — ABNORMAL HIGH (ref 11.6–15.2)

## 2011-08-15 LAB — GLUCOSE, CAPILLARY
Glucose-Capillary: 119 mg/dL — ABNORMAL HIGH (ref 70–99)
Glucose-Capillary: 177 mg/dL — ABNORMAL HIGH (ref 70–99)

## 2011-08-15 MED ORDER — WARFARIN SODIUM 4 MG PO TABS: 4.0000 mg | ORAL_TABLET | Freq: Once | ORAL | Status: DC

## 2011-08-15 MED ORDER — CLONAZEPAM 0.5 MG PO TABS: 0.5000 mg | ORAL_TABLET | Freq: Every evening | ORAL | Status: DC | PRN

## 2011-08-15 MED ORDER — WARFARIN SODIUM 4 MG PO TABS
4.0000 mg | ORAL_TABLET | Freq: Once | ORAL | Status: DC
  Filled 2011-08-15: qty 1

## 2011-08-15 NOTE — Progress Notes (Signed)
Subjective: Disposition planning has been main issue.  Creatinine down again.  Good UOP off lasix. No new complaints.  Objective Vital signs in last 24 hours: Filed Vitals:   08/14/11 2040 08/14/11 2111 08/15/11 0604 08/15/11 0930  BP:  152/57 152/70   Pulse:  86 79   Temp:  98.6 F (37 C) 98.6 F (37 C)   TempSrc:  Oral Oral   Resp:  20 18   Height:      Weight:      SpO2: 94% 93% 94% 93%   Weight change:   Intake/Output Summary (Last 24 hours) at 08/15/11 1119 Last data filed at 08/15/11 1016  Gross per 24 hour  Intake    720 ml  Output   1925 ml  Net  -1205 ml   Labs: Basic Metabolic Panel:  Lab 08/15/11 1610 08/14/11 0503 08/13/11 0453 08/12/11 0325 08/11/11 0513 08/10/11 0407 08/09/11 1759  NA 139 139 142 138 142 137 136  K 3.7 3.6 3.7 3.2* 3.6 4.4 3.9  CL 95* 93* 95* 91* 96 96 97  CO2 33* 33* 35* 30 32 30 26  GLUCOSE 153* 146* 141* 141* 130* 137* 123*  BUN 79* 76* 70* 61* 52* 40* 38*  CREATININE 4.46* 5.18* 5.77* 5.97* 6.10* 5.61* 5.30*  ALB -- -- -- -- -- -- --  CALCIUM 9.1 9.0 9.1 8.7 8.7 8.5 8.2*  PHOS -- -- -- -- -- 7.0* --   Liver Function Tests: No results found for this basename: AST:3,ALT:3,ALKPHOS:3,BILITOT:3,PROT:3,ALBUMIN:3 in the last 168 hours No results found for this basename: LIPASE:3,AMYLASE:3 in the last 168 hours No results found for this basename: AMMONIA:3 in the last 168 hours CBC:  Lab 08/13/11 0453 08/12/11 0325 08/11/11 0513 08/10/11 0407  WBC 10.4 11.2* 12.8* 12.3*  NEUTROABS 7.6 -- -- --  HGB 13.4 13.4 13.1 12.7*  HCT 42.4 40.5 41.0 39.3  MCV 98.4 95.7 98.3 98.0  PLT 393 368 349 291   PT/INR: @labrcntip (inr:5) Cardiac Enzymes:  Lab 08/15/11 0625 08/14/11 0503 08/13/11 0453 08/12/11 0325 08/11/11 0513  CKTOTAL 267* 588* 1324* 3226* 5644*  CKMB -- -- -- -- --  CKMBINDEX -- -- -- -- --  TROPONINI -- -- -- -- --   CBG:  Lab 08/15/11 0738 08/14/11 2108 08/14/11 1652 08/14/11 1126 08/14/11 0736  GLUCAP 119* 160* 134* 133*  145*    Iron Studies: No results found for this basename: IRON:30,TIBC:30,TRANSFERRIN:30,FERRITIN:30 in the last 168 hours  Physical Exam:  Blood pressure 152/70, pulse 79, temperature 98.6 F (37 C), temperature source Oral, resp. rate 18, height 4' (1.219 m), weight 120.8 kg (266 lb 5.1 oz), SpO2 93.00%.   Gen: alert, obese, NAD- talkative Skin: no rash, cyanosis  Neck: no JVD, no bruits or LAN  Chest: clear bilat Heart: regular, no rub or gallop, no murmur  Abdomen: soft, obese, +BS hyperactive  Ext: bilat AKA no edema or ulceration  Neuro: alert, Ox3, no focal deficit     CPK- down to 588 today- probably do not need to continue to check daily   Impression/Plan  1. AKI due to ATN from rhabdomyolysis- creat now trending down albeit slowly.  BUN is lagging behind as expected. . Nonoliguric even with no diuretics.   CK has improved.  Since renal function normal at baseline, hopeful for continued recovery.  2. AMS- prob medication induced, valium and/or neurontin. Rec'd romazicon w good response.  Much better today  3. Pulm edema - cxr was wet, better now.  Holding lasix and to follow 4. Fever / ^ WBC- on empiric abx with levaquin now. Vanc/zosyn stopped. UCx neg.  Per CCM 5. DM 2- per primary 6. Bilat AKA 7. Obesity 8. Hypokalemia- will re order repletion and follow 9. Anemia- not an issue so far 10. Dispo- .  Not sure if will be going home or to facility vs CIR will depend on his abilities and family willing to care for him. With the trend so far I am comfortable having him discharged and the trend can continue to be followed as OP If leaves the hospital will need chemistries 2 times per week to continue to reassess renal recovery. I dont think would need automatic renal follow up, would just need to see him as OP  if doesn't continue to recover. I will take his foley out today.  I will sign off, call with questions.    Mark Russell A l 08/15/2011, 11:19  AM

## 2011-08-15 NOTE — Progress Notes (Deleted)
Physician Discharge Summary  Mark Russell YQM:[id number] DOB: [birth year] DOA: 08/06/2011  PCP: Astrid Divine, MD  Admit date: 08/06/2011 Discharge date: 08/15/2011  Recommendations for Outpatient Follow-up:  1. Recommend followup with primary care physician with regards to polypharmacy 2. Continue Coumadin checks-get INR in 5 days 3. Repeat CBC and be met in 5 days.  Patient will need twice-weekly basic metabolic panel checked as per nephrologist-may need to be referred to them as an outpatient if creatinine claims however patient has shown a consistent trend downward     Discharge Diagnoses:  Active Problems:  Hypoxia  Leukocytosis  Sepsis  PVD (peripheral vascular disease)  S/P AKA (above knee amputation) bilateral  DVT (deep venous thrombosis)  ARF (acute renal failure)  Acute respiratory failure  Hypoxemia  Respiratory acidosis   Discharge Condition: Good  Diet recommendation: Low salt low potassium  Filed Weights   08/07/11 0300 08/09/11 1149 08/10/11 0400  Weight: 118.6 kg (261 lb 7.5 oz) 122.6 kg (270 lb 4.5 oz) 120.8 kg (266 lb 5.1 oz)   Mr. Griffee is a 76/M with history of diabetes,PVD/AKAs, history of DVTs on Coumadin, was found slumped over in his wheelchair by his wife 08/06/11.   He was found to be febrile, and diaphoretic at home and subsequently brought to to the ER by EMS .   He complained of cough, occasionally productive of white sputum with wheezing off and on for 2 weeks, had a chest x-ray done about 10 days ago by his PCP which was reportedly normal.   Subsequently admitted w/ sepsis suspected to be from a pulmonary source, started on Empiric antibiotics clinically improved  By day 2, his creatinine started climbing, was seen by Renal in consultation on 8/2, His ARF was felt to be from Rhabdomyolysis and sepsis.   In addition on 8/4 he was noted to be in acute hypercarbic resp failure from meds/pulm edema/OSA, started on Bipap, also seen by Pulmonary in  consultation.  Then started on diuretics per Renal and respiratory status has started to improve, now off bipap-he was transferred back to [facility] 08/12/11 and did not have any recurrence of CO2 narcosis.  See below for details  1. Sepsis -improved  Urine cultures and blood cultures negative   Congestion and cough, raising concern for bronchitis vs early pneumonia as source although CXR was normal on 8/1, repeat 2 view CXR 8/3 with peribronchial thickening only, CXR 8/4 w/ RUL consolidation   Dced IV Vancomycin and Zosyn 8/3, transitioned to levaquin 8/3, continue through 8/10-patient changed to by mouth Levaquin 08/12/2011 to complete course 8/10    2. Toxic metabolic encephalopathy  Secondary to sepsis had completely resolved by Day 1 of admission   Worsened (8/4) due to CO2 narcosis, meds   Flumazenil given x1 8/4   Mentation improving-seems to be a baseline Would limit narcotics and benzodiazepines as an outpatient-will inform Dr. Valentina Lucks about this Tolerating full diet now-graduate to full diabetic diet  3. Acute Hypercarbic respiratory failure  With acute change in mental status 8/4   With improvement since 8/4 night, more alert and interactive per family   Suspect multifactorial, drug induced from decreased renal clearing(worsening GFR) of sedatives/Benzos/Gabapentin, Pulmonary edema and OSA   Repeat CXR 08/11/2011 shows persistent pulmonary edema   Off Bipap since last pm, last ABG with improvement in resp acidosis-patient has a pickwickian habitus and will need BiPAP at night and formal sleep study as an outpatient-he refused to use BiPAP  All sedating home meds stopped  PCCM consult appreciated, DNI   4. ARF: ATN secondary to Rhabdomyolysis/ Sepsis   Creatinine improving substantially, Urine output good I/O last 3 completed shifts: In: 240 [P.O.:240] Out: 2300 [Urine:2300] renal USG normal   Vanc level-random was ok, IVF Dced 8/4 am, diuretics started and subsequently  discontinued by nephrologist Cleared for discharge from a renal standpoint 08/15/2011 but will need close followup and potential referral if this does climb again.  5. Lactic acidosis: on admission, resolved in 24H   6. H/o DVT: continue coumadin per pharmacy- Lab Results   Component  Value  Date     INR  1.93*  08/14/2011     INR  1.86*  08/13/2011     INR  2.14*  08/12/2011    7. PVD S/P Bilateral AKA-still complains of phantom pain.  Will be difficult to treat this with only gabapentin however the risks of sedation and confusion with opiates is to great. Family aware-I have restarted his gabapentin and his Prozac 08/13/2011 at home dosages of 900 3 times a day and 10 mg daily-discontinued all benzodiazepines in his specific case, discontinue opiates  8. DM: held amaryl, SSI change to [facility] & HS-CBG's stable.  continue same as an outpatient.  Code status: was changed to DNR 8/4 after discussion between PCCM and family, They do not want Intubation and hemodialysis at this point.  I had a detailed discussion with [name] the patient's daughter as well as his wife at bedside with regards to course of care and ultimate disposition-PT OT clear patient to be discharged home with 24-hour supervision although they also recommended nursing facility placement.  He potentially can maneuver and manage at home given his home is adopted for him specifically   Discharge Exam: Filed Vitals:   08/15/11 0604  BP: 152/70  Pulse: 79  Temp: 98.6 F (37 C)  Resp: 18   Filed Vitals:   08/14/11 2040 08/14/11 2111 08/15/11 0604 08/15/11 0930  BP:  152/57 152/70   Pulse:  86 79   Temp:  98.6 F (37 C) 98.6 F (37 C)   TempSrc:  Oral Oral   Resp:  20 18   Height:      Weight:      SpO2: 94% 93% 94% 93%   General: Obese alert oriented x3Caucasian male  Cardiovascular: S1-S2 no murmur rub or gallop  Respiratory: Fine wheezes to the left posterior base to regions of the lungs  Abdomen: Soft nontender  nondistended no rebound or guarding midline scar noted  Skin patient has bilateral BKA's  Neuro oriented x3  Discharge Instructions   Medication List  As of 08/15/2011 11:59 AM   ASK your doctor about these medications         clonazePAM 0.5 MG tablet   Commonly known as: KLONOPIN   Take 0.5 mg by mouth at bedtime as needed.      diazepam 10 MG tablet   Commonly known as: VALIUM   Take 10 mg by mouth 3 (three) times daily as needed.      FLUoxetine 10 MG tablet   Commonly known as: PROZAC   Take 10 mg by mouth daily.      gabapentin 300 MG capsule   Commonly known as: NEURONTIN   Take 900 mg by mouth 3 (three) times daily.      glimepiride 2 MG tablet   Commonly known as: AMARYL   Take 2 mg by mouth daily before breakfast.      imipramine  10 MG tablet   Commonly known as: TOFRANIL   Take 20 mg by mouth at bedtime.      losartan-hydrochlorothiazide 100-25 MG per tablet   Commonly known as: HYZAAR   Take 1 tablet by mouth daily.      omeprazole 20 MG capsule   Commonly known as: PRILOSEC   Take 40 mg by mouth 2 (two) times daily.      simvastatin 40 MG tablet   Commonly known as: ZOCOR   Take 40 mg by mouth every evening.              The results of significant diagnostics from this hospitalization (including imaging, microbiology, ancillary and laboratory) are listed below for reference.    Significant Diagnostic Studies: Dg Chest 2 View  08/08/2011  *RADIOLOGY REPORT*  Clinical Data: Suspect pneumonia.  CHEST - 2 VIEW  Comparison: 08/06/2011  Findings: Heart size appears normal.  Chronic bilateral peribronchial thickening is again noted and appears unchanged from previous exam.  No pleural effusion or interstitial edema.  No airspace consolidation.  IMPRESSION:  1.  Stable chronic bilateral peribronchial thickening.  Original Report Authenticated By: Rosealee Albee, M.D.   Dg Chest 2 View  08/06/2011  *RADIOLOGY REPORT*  Clinical Data: Shortness of breath.   History of diabetes.  CHEST - 2 VIEW  Comparison: Chest radiograph 07/22/2011 and 06/25/2008  Findings: Lung volumes are slightly low.  Heart size is borderline enlarged and stable.  Surgical clips noted over the right axillary region/ versus anterior right chest wall.  Bilateral peribronchial thickening appears unchanged compared to prior chest radiographs.No focal airspace disease, effusion, or pulmonary edema is identified.  Negative for pneumothorax.  No acute bony abnormalities identified.  Surgical clips project over the upper abdomen, on the lateral view.  IMPRESSION:  1.  Stable exam.  Mild chronic bilateral peribronchial thickening. This can be seen in the setting of acute or chronic bronchitis, smoking, or asthma. 2.  No definite airspace disease is identified.  Original Report Authenticated By: Britta Mccreedy, M.D.   US Renal Port  08/07/2011  *RADIOLOGY REPORT*  Clinical Data: History of acute renal failure.  History of diabetes.  RENAL/URINARY TRACT ULTRASOUND COMPLETE  Comparison:  None.  Findings:  Right Kidney:  Right renal length is 13.4 cm.  Left Kidney:  Left renal length is 13.8 cm.  Examination of each kidney shows no evidence of hydronephrosis, solid or cystic mass, calculus, parenchymal loss, or parenchymal textural abnormality.  Bladder:  Bladder is decompressed by Foley catheter.  IMPRESSION: No renal abnormalities are evident by ultrasound.  Original Report Authenticated By: Crawford Givens, M.D.   Dg Chest Port 1 View  08/14/2011  *RADIOLOGY REPORT*  Clinical Data: Wheezing and productive cough.  PORTABLE CHEST - 1 VIEW  Comparison: Portal chest 08/11/2011.  Findings: The heart is mildly enlarged.  There is slight improvement in a diffuse interstitial pattern, still compatible with edema.  Postsurgical changes are evident in the right axilla. Mild bibasilar airspace disease likely reflects atelectasis.  IMPRESSION:  1.  Slight improvement in the interstitial pattern compatible with improving  congestive heart failure. 2.  Bibasilar airspace disease likely reflects atelectasis.  Original Report Authenticated By: Jamesetta Orleans. MATTERN, M.D.   Dg Chest Port 1 View  08/11/2011  *RADIOLOGY REPORT*  Clinical Data: Evaluate pulmonary edema, hypertension  PORTABLE CHEST - 1 VIEW  Comparison: 08/10/2011; 08/09/2011; 08/08/2011  Findings:  Grossly unchanged enlarged cardiac silhouette and mediastinal contours.  The pulmonary vasculature remains indistinct.  Grossly unchanged bilateral infrahilar heterogeneous opacities.  Trace bilateral effusions are not excluded.  No pneumothorax.  Unchanged bones.  Surgical clips overlie the right axilla.  IMPRESSION: Grossly unchanged findings suggestive of mild pulmonary edema.  Original Report Authenticated By: Waynard Reeds, M.D.   Dg Chest Port 1 View  08/10/2011  *RADIOLOGY REPORT*  Clinical Data: CHF.  Pulmonary edema.  PORTABLE CHEST - 1 VIEW  Comparison: 08/09/2011.  Findings: Slightly improved airspace disease in the upper lobes. Persistent bilateral basilar airspace disease and atelectasis. Cardiomegaly.  There is improved aeration of the right upper lobe, likely representing resolution of mucous plugging.  IMPRESSION: Mild improvement in pulmonary aeration/edema with mild to moderate CHF.  Original Report Authenticated By: Andreas Newport, M.D.   Dg Chest Port 1 View  08/09/2011  *RADIOLOGY REPORT*  Clinical Data: Respiratory distress.  PORTABLE CHEST - 1 VIEW  Comparison: 08/08/2011  Findings: Heart size appears enlarged.  Lung volumes are low and there is diffuse pulmonary edema.  Focal area of consolidation is noted in the right upper lobe.  IMPRESSION:  1.  Pulmonary edema 2.  Right upper lobe consolidation.  Original Report Authenticated By: Rosealee Albee, M.D.    Microbiology: Recent Results (from the past 240 hour(s))  CULTURE, BLOOD (ROUTINE X 2)     Status: Normal   Collection Time   08/06/11 10:00 AM      Component Value Range Status Comment     Specimen Description BLOOD LEFT HAND   Final    Special Requests BOTTLES DRAWN AEROBIC AND ANAEROBIC [facility]   Final    Culture  Setup Time 08/06/2011 12:54   Final    Culture NO GROWTH 5 DAYS   Final    Report Status 08/12/2011 FINAL   Final   CULTURE, BLOOD (ROUTINE X 2)     Status: Normal   Collection Time   08/06/11 11:32 AM      Component Value Range Status Comment   Specimen Description BLOOD LEFT ARM   Final    Special Requests BOTTLES DRAWN AEROBIC AND ANAEROBIC [facility] EACH   Final    Culture  Setup Time 08/06/2011 23:11   Final    Culture NO GROWTH 5 DAYS   Final    Report Status 08/12/2011 FINAL   Final   URINE CULTURE     Status: Normal   Collection Time   08/06/11 11:57 AM      Component Value Range Status Comment   Specimen Description URINE, CATHETERIZED   Final    Special Requests NONE   Final    Culture  Setup Time 08/06/2011 19:58   Final    Colony Count NO GROWTH   Final    Culture NO GROWTH   Final    Report Status 08/07/2011 FINAL   Final   MRSA PCR SCREENING     Status: Normal   Collection Time   08/06/11  3:45 PM      Component Value Range Status Comment   MRSA by PCR NEGATIVE  NEGATIVE Final      Labs: Basic Metabolic Panel:  Lab 08/15/11 5621 08/14/11 0503 08/13/11 0453 08/12/11 0325 08/11/11 0513 08/10/11 0407  NA 139 139 142 138 142 --  K 3.7 3.6 3.7 3.2* 3.6 --  CL 95* 93* 95* 91* 96 --  CO2 33* 33* 35* 30 32 --  GLUCOSE 153* 146* 141* 141* 130* --  BUN 79* 76* 70* 61* 52* --  CREATININE 4.46* 5.18*  5.77* 5.97* 6.10* --  CALCIUM 9.1 9.0 9.1 8.7 8.7 --  MG -- -- -- -- -- 2.2  PHOS -- -- -- -- -- 7.0*   Liver Function Tests: No results found for this basename: AST:5,ALT:5,ALKPHOS:5,BILITOT:5,PROT:5,ALBUMIN:5 in the last 168 hours No results found for this basename: LIPASE:5,AMYLASE:5 in the last 168 hours No results found for this basename: AMMONIA:5 in the last 168 hours CBC:  Lab 08/13/11 0453 08/12/11 0325 08/11/11 0513 08/10/11 0407  WBC  10.4 11.2* 12.8* 12.3*  NEUTROABS 7.6 -- -- --  HGB 13.4 13.4 13.1 12.7*  HCT 42.4 40.5 41.0 39.3  MCV 98.4 95.7 98.3 98.0  PLT 393 368 349 291   Cardiac Enzymes:  Lab 08/15/11 0625 08/14/11 0503 08/13/11 0453 08/12/11 0325 08/11/11 0513  CKTOTAL 267* 588* 1324* 3226* 5644*  CKMB -- -- -- -- --  CKMBINDEX -- -- -- -- --  TROPONINI -- -- -- -- --   BNP: BNP (last 3 results)  Basename 08/09/11 0910  PROBNP 1971.0*   CBG:  Lab 08/15/11 1123 08/15/11 0738 08/14/11 2108 08/14/11 1652 08/14/11 1126  GLUCAP 177* 119* 160* 134* 133*    Time coordinating discharge: 40 minutes  Signed:  Rhetta Mura  [facility] 08/15/2011, 11:53 AM

## 2011-08-15 NOTE — Progress Notes (Signed)
He is d/c with his electric w/c with his son and a friend without incident.  His sone signs for his prescriptions (Coumadin and Klonipin); and his d/c instructions.  He is awake, alert and in no distress.  He voided without difficulty ~1/2 hour prior to d/c.

## 2011-08-15 NOTE — Progress Notes (Signed)
ANTICOAGULATION CONSULT NOTE - Follow Up  Pharmacy Consult for Warfarin Indication: h/o DVTs  Allergies  Allergen Reactions  . Morphine And Related Other (See Comments)    "mean"    Patient Measurements: Height: 4' (121.9 cm) Weight: 266 lb 5.1 oz (120.8 kg) IBW/kg (Calculated) : 22.4   Vital Signs: Temp: 98.6 F (37 C) (08/10 0604) Temp src: Oral (08/10 0604) BP: 152/70 mmHg (08/10 0604) Pulse Rate: 79  (08/10 0604)  Labs:  Basename 08/15/11 0625 08/14/11 0503 08/13/11 0453  HGB -- -- 13.4  HCT -- -- 42.4  PLT -- -- 393  APTT -- -- --  LABPROT 22.7* 22.4* 21.8*  INR 1.96* 1.93* 1.86*  HEPARINUNFRC -- -- --  CREATININE 4.46* 5.18* 5.77*  CKTOTAL 267* 588* 1324*  CKMB -- -- --  TROPONINI -- -- --    Estimated Creatinine Clearance: 12.7 ml/min (by C-G formula based on Cr of 4.46).  Assessment:  7 YOM with h/o DVTs on Coumadin PTA.  PTA dose was 4 mg/day (confirmed with wife).    INR slightly subtherapeutic (increasing now after holding due to supratherapeutic levels)  No bleeding reported/documented.  Dysphagia diet.  DDI with Levaquin noted - potentially can increase INR.  Per MD notes, will complete Levaquin on 8/10.  Goal of Therapy:  INR 2-3 Monitor platelets by anticoagulation protocol: Yes   Plan:   Warfarin 4mg  today.  F/u daily PT/INR.   Lynann Beaver PharmD, BCPS Pager [phone] 08/15/2011 12:00 PM

## 2011-08-18 NOTE — Discharge Summary (Signed)
Physician Discharge Summary  Mark Russell HQI:[id number] DOB: [birth year] DOA: 08/06/2011  PCP: Astrid Divine, MD  Admit date: 08/06/2011 Discharge date: 08/18/2011  Recommendations for Outpatient Follow-up:  1. Recommend followup with primary care physician with regards to polypharmacy 2. Continue Coumadin checks-get INR in 5 days 3. Repeat CBC and be met in 5 days.  Patient will need twice-weekly basic metabolic panel checked as per nephrologist-may need to be referred to them as an outpatient if creatinine claims however patient has shown a consistent trend downward     Discharge Diagnoses:  Active Problems:  Hypoxia  Leukocytosis  Sepsis  PVD (peripheral vascular disease)  S/P AKA (above knee amputation) bilateral  DVT (deep venous thrombosis)  ARF (acute renal failure)  Acute respiratory failure  Hypoxemia  Respiratory acidosis   Discharge Condition: Good  Diet recommendation: Low salt low potassium  Filed Weights   08/07/11 0300 08/09/11 1149 08/10/11 0400  Weight: 118.6 kg (261 lb 7.5 oz) 122.6 kg (270 lb 4.5 oz) 120.8 kg (266 lb 5.1 oz)   Mark Russell is a 76/M with history of diabetes,PVD/AKAs, history of DVTs on Coumadin, was found slumped over in his wheelchair by his wife 08/06/11.   He was found to be febrile, and diaphoretic at home and subsequently brought to to the ER by EMS .   He complained of cough, occasionally productive of white sputum with wheezing off and on for 2 weeks, had a chest x-ray done about 10 days ago by his PCP which was reportedly normal.   Subsequently admitted w/ sepsis suspected to be from a pulmonary source, started on Empiric antibiotics clinically improved  By day 2, his creatinine started climbing, was seen by Renal in consultation on 8/2, His ARF was felt to be from Rhabdomyolysis and sepsis.   In addition on 8/4 he was noted to be in acute hypercarbic resp failure from meds/pulm edema/OSA, started on Bipap, also seen by Pulmonary in  consultation.  Then started on diuretics per Renal and respiratory status has started to improve, now off bipap-he was transferred back to [facility] 08/12/11 and did not have any recurrence of CO2 narcosis.  See below for details  1. Sepsis -improved  Urine cultures and blood cultures negative   Congestion and cough, raising concern for bronchitis vs early pneumonia as source although CXR was normal on 8/1, repeat 2 view CXR 8/3 with peribronchial thickening only, CXR 8/4 w/ RUL consolidation   Dced IV Vancomycin and Zosyn 8/3, transitioned to levaquin 8/3, continue through 8/10-patient changed to by mouth Levaquin 08/12/2011 to complete course 8/10    2. Toxic metabolic encephalopathy  Secondary to sepsis had completely resolved by Day 1 of admission   Worsened (8/4) due to CO2 narcosis, meds   Flumazenil given x1 8/4   Mentation improving-seems to be a baseline Would limit narcotics and benzodiazepines as an outpatient-will inform Dr. Valentina Lucks about this Tolerating full diet now-graduate to full diabetic diet  3. Acute Hypercarbic respiratory failure  With acute change in mental status 8/4   With improvement since 8/4 night, more alert and interactive per family   Suspect multifactorial, drug induced from decreased renal clearing(worsening GFR) of sedatives/Benzos/Gabapentin, Pulmonary edema and OSA   Repeat CXR 08/11/2011 shows persistent pulmonary edema   Off Bipap since last pm, last ABG with improvement in resp acidosis-patient has a pickwickian habitus and will need BiPAP at night and formal sleep study as an outpatient-he refused to use BiPAP  All sedating home meds stopped  PCCM consult appreciated, DNI   4. ARF: ATN secondary to Rhabdomyolysis/ Sepsis   Creatinine improving substantially, Urine output good I/O last 3 completed shifts: In: 240 [P.O.:240] Out: 2300 [Urine:2300] renal USG normal   Vanc level-random was ok, IVF Dced 8/4 am, diuretics started and subsequently  discontinued by nephrologist Cleared for discharge from a renal standpoint 08/15/2011 but will need close followup and potential referral if this does climb again.  5. Lactic acidosis: on admission, resolved in 24H   6. H/o DVT: continue coumadin per pharmacy- Lab Results   Component  Value  Date     INR  1.93*  08/14/2011     INR  1.86*  08/13/2011     INR  2.14*  08/12/2011    7. PVD S/P Bilateral AKA-still complains of phantom pain.  Will be difficult to treat this with only gabapentin however the risks of sedation and confusion with opiates is to great. Family aware-I have restarted his gabapentin and his Prozac 08/13/2011 at home dosages of 900 3 times a day and 10 mg daily-discontinued all benzodiazepines in his specific case, discontinue opiates  8. DM: held amaryl, SSI change to [facility] & HS-CBG's stable.  continue same as an outpatient.  Code status: was changed to DNR 8/4 after discussion between PCCM and family, They do not want Intubation and hemodialysis at this point.  I had a detailed discussion with Mark Russell the patient's daughter as well as his wife at bedside with regards to course of care and ultimate disposition-PT OT clear patient to be discharged home with 24-hour supervision although they also recommended nursing facility placement.  He potentially can maneuver and manage at home given his home is adopted for him specifically   Discharge Exam: Filed Vitals:   08/15/11 0604  BP: 152/70  Pulse: 79  Temp: 98.6 F (37 C)  Resp: 18   Filed Vitals:   08/14/11 2040 08/14/11 2111 08/15/11 0604 08/15/11 0930  BP:  152/57 152/70   Pulse:  86 79   Temp:  98.6 F (37 C) 98.6 F (37 C)   TempSrc:  Oral Oral   Resp:  20 18   Height:      Weight:      SpO2: 94% 93% 94% 93%   General: Obese alert oriented x3Caucasian male  Cardiovascular: S1-S2 no murmur rub or gallop  Respiratory: Fine wheezes to the left posterior base to regions of the lungs  Abdomen: Soft nontender  nondistended no rebound or guarding midline scar noted  Skin patient has bilateral BKA's  Neuro oriented x3  Discharge Instructions   Medication List  As of 08/18/2011 10:30 AM   STOP taking these medications         diazepam 10 MG tablet      imipramine 10 MG tablet         TAKE these medications         clonazePAM 0.5 MG tablet   Commonly known as: KLONOPIN   Take 1 tablet (0.5 mg total) by mouth at bedtime as needed (sleep).      FLUoxetine 10 MG tablet   Commonly known as: PROZAC   Take 10 mg by mouth daily.      gabapentin 300 MG capsule   Commonly known as: NEURONTIN   Take 900 mg by mouth 3 (three) times daily.      glimepiride 2 MG tablet   Commonly known as: AMARYL   Take 2 mg by mouth daily before breakfast.  losartan-hydrochlorothiazide 100-25 MG per tablet   Commonly known as: HYZAAR   Take 1 tablet by mouth daily.      omeprazole 20 MG capsule   Commonly known as: PRILOSEC   Take 40 mg by mouth 2 (two) times daily.      simvastatin 40 MG tablet   Commonly known as: ZOCOR   Take 40 mg by mouth every evening.      warfarin 4 MG tablet   Commonly known as: COUMADIN   Take 1 tablet (4 mg total) by mouth one time only at 6 PM.           Follow-up Information    Follow up with Astrid Divine, MD. Schedule an appointment as soon as possible for a visit in 5 days.   Contact information:   [address], [address] [place] [address] [phone]           The results of significant diagnostics from this hospitalization (including imaging, microbiology, ancillary and laboratory) are listed below for reference.    Significant Diagnostic Studies: Dg Chest 2 View  08/08/2011  *RADIOLOGY REPORT*  Clinical Data: Suspect pneumonia.  CHEST - 2 VIEW  Comparison: 08/06/2011  Findings: Heart size appears normal.  Chronic bilateral peribronchial thickening is again noted and appears unchanged from previous exam.  No pleural  effusion or interstitial edema.  No airspace consolidation.  IMPRESSION:  1.  Stable chronic bilateral peribronchial thickening.  Original Report Authenticated By: Rosealee Albee, M.D.   Dg Chest 2 View  08/06/2011  *RADIOLOGY REPORT*  Clinical Data: Shortness of breath.  History of diabetes.  CHEST - 2 VIEW  Comparison: Chest radiograph 07/22/2011 and 06/25/2008  Findings: Lung volumes are slightly low.  Heart size is borderline enlarged and stable.  Surgical clips noted over the right axillary region/ versus anterior right chest wall.  Bilateral peribronchial thickening appears unchanged compared to prior chest radiographs.No focal airspace disease, effusion, or pulmonary edema is identified.  Negative for pneumothorax.  No acute bony abnormalities identified.  Surgical clips project over the upper abdomen, on the lateral view.  IMPRESSION:  1.  Stable exam.  Mild chronic bilateral peribronchial thickening. This can be seen in the setting of acute or chronic bronchitis, smoking, or asthma. 2.  No definite airspace disease is identified.  Original Report Authenticated By: Britta Mccreedy, M.D.   US Renal Port  08/07/2011  *RADIOLOGY REPORT*  Clinical Data: History of acute renal failure.  History of diabetes.  RENAL/URINARY TRACT ULTRASOUND COMPLETE  Comparison:  None.  Findings:  Right Kidney:  Right renal length is 13.4 cm.  Left Kidney:  Left renal length is 13.8 cm.  Examination of each kidney shows no evidence of hydronephrosis, solid or cystic mass, calculus, parenchymal loss, or parenchymal textural abnormality.  Bladder:  Bladder is decompressed by Foley catheter.  IMPRESSION: No renal abnormalities are evident by ultrasound.  Original Report Authenticated By: Crawford Givens, M.D.   Dg Chest Port 1 View  08/14/2011  *RADIOLOGY REPORT*  Clinical Data: Wheezing and productive cough.  PORTABLE CHEST - 1 VIEW  Comparison: Portal chest 08/11/2011.  Findings: The heart is mildly enlarged.  There is slight  improvement in a diffuse interstitial pattern, still compatible with edema.  Postsurgical changes are evident in the right axilla. Mild bibasilar airspace disease likely reflects atelectasis.  IMPRESSION:  1.  Slight improvement in the interstitial pattern compatible with improving congestive heart failure. 2.  Bibasilar airspace disease likely reflects atelectasis.  Original Report Authenticated By: Jamesetta Orleans. MATTERN, M.D.   Dg Chest Port 1 View  08/11/2011  *RADIOLOGY REPORT*  Clinical Data: Evaluate pulmonary edema, hypertension  PORTABLE CHEST - 1 VIEW  Comparison: 08/10/2011; 08/09/2011; 08/08/2011  Findings:  Grossly unchanged enlarged cardiac silhouette and mediastinal contours.  The pulmonary vasculature remains indistinct.  Grossly unchanged bilateral infrahilar heterogeneous opacities.  Trace bilateral effusions are not excluded.  No pneumothorax.  Unchanged bones.  Surgical clips overlie the right axilla.  IMPRESSION: Grossly unchanged findings suggestive of mild pulmonary edema.  Original Report Authenticated By: Waynard Reeds, M.D.   Dg Chest Port 1 View  08/10/2011  *RADIOLOGY REPORT*  Clinical Data: CHF.  Pulmonary edema.  PORTABLE CHEST - 1 VIEW  Comparison: 08/09/2011.  Findings: Slightly improved airspace disease in the upper lobes. Persistent bilateral basilar airspace disease and atelectasis. Cardiomegaly.  There is improved aeration of the right upper lobe, likely representing resolution of mucous plugging.  IMPRESSION: Mild improvement in pulmonary aeration/edema with mild to moderate CHF.  Original Report Authenticated By: Andreas Newport, M.D.   Dg Chest Port 1 View  08/09/2011  *RADIOLOGY REPORT*  Clinical Data: Respiratory distress.  PORTABLE CHEST - 1 VIEW  Comparison: 08/08/2011  Findings: Heart size appears enlarged.  Lung volumes are low and there is diffuse pulmonary edema.  Focal area of consolidation is noted in the right upper lobe.  IMPRESSION:  1.  Pulmonary edema 2.   Right upper lobe consolidation.  Original Report Authenticated By: Rosealee Albee, M.D.    Microbiology: No results found for this or any previous visit (from the past 240 hour(s)).   Labs: Basic Metabolic Panel:  Lab 08/15/11 1610 08/14/11 0503 08/13/11 0453 08/12/11 0325  NA 139 139 142 138  K 3.7 3.6 3.7 3.2*  CL 95* 93* 95* 91*  CO2 33* 33* 35* 30  GLUCOSE 153* 146* 141* 141*  BUN 79* 76* 70* 61*  CREATININE 4.46* 5.18* 5.77* 5.97*  CALCIUM 9.1 9.0 9.1 8.7  MG -- -- -- --  PHOS -- -- -- --   Liver Function Tests: No results found for this basename: AST:5,ALT:5,ALKPHOS:5,BILITOT:5,PROT:5,ALBUMIN:5 in the last 168 hours No results found for this basename: LIPASE:5,AMYLASE:5 in the last 168 hours No results found for this basename: AMMONIA:5 in the last 168 hours CBC:  Lab 08/13/11 0453 08/12/11 0325  WBC 10.4 11.2*  NEUTROABS 7.6 --  HGB 13.4 13.4  HCT 42.4 40.5  MCV 98.4 95.7  PLT 393 368   Cardiac Enzymes:  Lab 08/15/11 0625 08/14/11 0503 08/13/11 0453 08/12/11 0325  CKTOTAL 267* 588* 1324* 3226*  CKMB -- -- -- --  CKMBINDEX -- -- -- --  TROPONINI -- -- -- --   BNP: BNP (last 3 results)  Basename 08/09/11 0910  PROBNP 1971.0*   CBG:  Lab 08/15/11 1123 08/15/11 0738 08/14/11 2108 08/14/11 1652 08/14/11 1126  GLUCAP 177* 119* 160* 134* 133*    Time coordinating discharge: 40 minutes  Signed:  Rhetta Mura  [facility] 08/18/2011, 10:30 AM

## 2012-03-05 ENCOUNTER — Emergency Department (HOSPITAL_COMMUNITY): Payer: Medicare Other

## 2012-03-05 ENCOUNTER — Emergency Department (HOSPITAL_COMMUNITY)
Admission: EM | Admit: 2012-03-05 | Discharge: 2012-03-05 | Disposition: A | Discharge status: Home / Self Care | Payer: Medicare Other | Attending: Emergency Medicine | Admitting: Emergency Medicine

## 2012-03-05 DIAGNOSIS — H00036 Abscess of eyelid left eye, unspecified eyelid: Secondary | ICD-10-CM

## 2012-03-05 DIAGNOSIS — H019 Unspecified inflammation of eyelid: Secondary | ICD-10-CM | POA: Insufficient documentation

## 2012-03-05 DIAGNOSIS — F329 Major depressive disorder, single episode, unspecified: Secondary | ICD-10-CM | POA: Insufficient documentation

## 2012-03-05 DIAGNOSIS — F411 Generalized anxiety disorder: Secondary | ICD-10-CM | POA: Insufficient documentation

## 2012-03-05 DIAGNOSIS — I10 Essential (primary) hypertension: Secondary | ICD-10-CM | POA: Insufficient documentation

## 2012-03-05 DIAGNOSIS — K219 Gastro-esophageal reflux disease without esophagitis: Secondary | ICD-10-CM | POA: Insufficient documentation

## 2012-03-05 DIAGNOSIS — F3289 Other specified depressive episodes: Secondary | ICD-10-CM | POA: Insufficient documentation

## 2012-03-05 DIAGNOSIS — H5789 Other specified disorders of eye and adnexa: Secondary | ICD-10-CM | POA: Insufficient documentation

## 2012-03-05 DIAGNOSIS — Z8679 Personal history of other diseases of the circulatory system: Secondary | ICD-10-CM | POA: Insufficient documentation

## 2012-03-05 DIAGNOSIS — E119 Type 2 diabetes mellitus without complications: Secondary | ICD-10-CM | POA: Insufficient documentation

## 2012-03-05 DIAGNOSIS — Z8669 Personal history of other diseases of the nervous system and sense organs: Secondary | ICD-10-CM | POA: Insufficient documentation

## 2012-03-05 DIAGNOSIS — H05019 Cellulitis of unspecified orbit: Secondary | ICD-10-CM | POA: Insufficient documentation

## 2012-03-05 DIAGNOSIS — Z7901 Long term (current) use of anticoagulants: Secondary | ICD-10-CM | POA: Insufficient documentation

## 2012-03-05 DIAGNOSIS — Z87891 Personal history of nicotine dependence: Secondary | ICD-10-CM | POA: Insufficient documentation

## 2012-03-05 DIAGNOSIS — Z79899 Other long term (current) drug therapy: Secondary | ICD-10-CM | POA: Insufficient documentation

## 2012-03-05 MED ORDER — CLINDAMYCIN HCL 300 MG PO CAPS: 300.0000 mg | ORAL_CAPSULE | Freq: Four times a day (QID) | ORAL | Status: DC

## 2012-03-05 MED ORDER — CLINDAMYCIN HCL 300 MG PO CAPS
300.0000 mg | ORAL_CAPSULE | Freq: Once | ORAL | Status: AC
  Administered 2012-03-05: 300 mg via ORAL
  Filled 2012-03-05: qty 1

## 2012-03-05 NOTE — ED Provider Notes (Signed)
History     CSN: [id number]  Arrival date & time 03/05/12  1225   First MD Initiated Contact with Patient 03/05/12 1253      Chief Complaint  Patient presents with  . Eye Problem    (Consider location/radiation/quality/duration/timing/severity/associated sxs/prior treatment) The history is provided by the patient.  Mark Russell is a 77 y.o. male history of peripheral vascular disease status post bilateral AKA, GERD, diabetes, here presenting with left eye swelling. It started with a small lesion on the left eyebrow then it spread to the left entire eye since yesterday. I was swollen shut this morning he used some cold packs and now can open his eye. Denies any purulent discharge from the eye. Denies any fevers or chills. Denies any foreign body in his eye. He said that his vision has not changed. Went to see PMD and sent in for evaluation. WBC 11 in the clinic this AM.    Past Medical History  Diagnosis Date  . Diabetes mellitus   . Hypertension   . GERD (gastroesophageal reflux disease)   . Anxiety   . Depression   . Peripheral vascular disease   . Neuropathy     Past Surgical History  Procedure Laterality Date  . Leg amputation through knee  06/2004  . Abdominal aortic aneurysm repair  1988  . Back surgery  1988, 1989    No family history on file.  History  Substance Use Topics  . Smoking status: Former Smoker    Types: Cigarettes    Quit date: 08/06/1995  . Smokeless tobacco: Never Used  . Alcohol Use: No      Review of Systems  HENT:       L eye swelling and pain   All other systems reviewed and are negative.    Allergies  Morphine and related  Home Medications   Current Outpatient Rx  Name  Route  Sig  Dispense  Refill  . FLUoxetine (PROZAC) 20 MG capsule   Oral   Take 20 mg by mouth daily.         Marland Kitchen gabapentin (NEURONTIN) 300 MG capsule   Oral   Take 600 mg by mouth 2 (two) times daily.          Marland Kitchen glimepiride (AMARYL) 2 MG tablet  Oral   Take 2 mg by mouth daily before breakfast.         . HYDROcodone-acetaminophen (NORCO/VICODIN) 5-325 MG per tablet   Oral   Take 1 tablet by mouth every 8 (eight) hours as needed for pain.         Marland Kitchen omeprazole (PRILOSEC) 20 MG capsule   Oral   Take 20 mg by mouth daily.          . simvastatin (ZOCOR) 40 MG tablet   Oral   Take 40 mg by mouth every evening.         . warfarin (COUMADIN) 4 MG tablet   Oral   Take 2-4 mg by mouth as directed. Takes 1/2 tablet on Sunday Tuesday and Thursday and 1 whole tablet all other days           BP 163/69  Pulse 76  Temp(Src) 98.3 F (36.8 C) (Oral)  Resp 16  Physical Exam  Nursing note and vitals reviewed. Constitutional: He is oriented to person, place, and time.  Chronically ill, L eyelid swollen and red   HENT:  Head: Normocephalic.  Mouth/Throat: Oropharynx is clear and moist.  Eyes:  L  eyelid swollen. Extra ocular movements intact bilaterally. Vision 20/25 bilaterally. No obvious conjunctivitis bilaterally.   Neck: Normal range of motion. Neck supple.  Cardiovascular: Normal rate, regular rhythm and normal heart sounds.   Pulmonary/Chest: Effort normal and breath sounds normal. No respiratory distress. He has no wheezes. He has no rales.  Abdominal: Soft. Bowel sounds are normal. He exhibits no distension. There is no tenderness. There is no rebound.  Musculoskeletal:  Bilateral AKA   Neurological: He is alert and oriented to person, place, and time.  Skin: Skin is warm and dry.  Psychiatric: He has a normal mood and affect. His behavior is normal. Judgment and thought content normal.    ED Course  Procedures (including critical care time)  Labs Reviewed - No data to display Ct Orbitss W/o Cm  03/05/2012  *RADIOLOGY REPORT*  Clinical Data: Left orbital swelling, question orbital cellulitis.  CT ORBITS WITHOUT CONTRAST  Technique:  Multidetector CT imaging of the orbits was performed following the standard  protocol without intravenous contrast.  Comparison: None.  Findings: Soft tissue swelling overlying the left orbit with stranding in the subcutaneous soft tissues compatible with preseptal cellulitis.  No postseptal extension.  Globe is intact. No focal fluid collection to suggest abscess.  Mild mucosal thickening in the left frontal sinus and scattered ethmoid air cells.  No air fluid levels.  No acute bony abnormality.  IMPRESSION: Preseptal soft tissue swelling overlying the left orbit compatible with preseptal cellulitis.  No focal fluid collection or post septal extension.   Original Report Authenticated By: Charlett Nose, M.D.      No diagnosis found.    MDM  Mark Russell is a 77 y.o. male here with L eye swelling. Likely preseptal cellulitis. Will get CT orbit to r/o orbital cellulitis. Will give clinda for coverage.    1:48 PM CT showed preseptal cellulitis. Will d/c home on clinda.       Richardean Canal, MD 03/05/12 [id number]

## 2012-03-05 NOTE — ED Notes (Signed)
He states he had a "bump" on his left upper eyelid yesterday which he "mashed".  Today he notes much edema and erythema at entire left orbit/eyelids area with minimal discomfort.  He saw his [facility] physician this morning, who recommended he come "here to be checked because I'm a diabetic".  He states otherwise he feels well.

## 2012-03-07 LAB — GLUCOSE, CAPILLARY: Glucose-Capillary: 101 mg/dL — ABNORMAL HIGH (ref 70–99)

## 2012-03-28 ENCOUNTER — Observation Stay (HOSPITAL_COMMUNITY)
Admission: EM | Admit: 2012-03-28 | Discharge: 2012-03-30 | Disposition: A | Discharge status: Home / Self Care | Payer: Medicare Other | Attending: Family Medicine | Admitting: Family Medicine

## 2012-03-28 ENCOUNTER — Emergency Department (HOSPITAL_COMMUNITY): Payer: Medicare Other

## 2012-03-28 ENCOUNTER — Encounter (HOSPITAL_COMMUNITY): Payer: Self-pay | Admitting: Emergency Medicine

## 2012-03-28 DIAGNOSIS — S78119A Complete traumatic amputation at level between unspecified hip and knee, initial encounter: Secondary | ICD-10-CM | POA: Insufficient documentation

## 2012-03-28 DIAGNOSIS — F3289 Other specified depressive episodes: Secondary | ICD-10-CM | POA: Insufficient documentation

## 2012-03-28 DIAGNOSIS — Z86718 Personal history of other venous thrombosis and embolism: Secondary | ICD-10-CM | POA: Insufficient documentation

## 2012-03-28 DIAGNOSIS — I70209 Unspecified atherosclerosis of native arteries of extremities, unspecified extremity: Secondary | ICD-10-CM | POA: Diagnosis present

## 2012-03-28 DIAGNOSIS — I82401 Acute embolism and thrombosis of unspecified deep veins of right lower extremity: Secondary | ICD-10-CM

## 2012-03-28 DIAGNOSIS — R1310 Dysphagia, unspecified: Secondary | ICD-10-CM

## 2012-03-28 DIAGNOSIS — I82409 Acute embolism and thrombosis of unspecified deep veins of unspecified lower extremity: Secondary | ICD-10-CM

## 2012-03-28 DIAGNOSIS — E119 Type 2 diabetes mellitus without complications: Secondary | ICD-10-CM | POA: Insufficient documentation

## 2012-03-28 DIAGNOSIS — R110 Nausea: Secondary | ICD-10-CM

## 2012-03-28 DIAGNOSIS — I739 Peripheral vascular disease, unspecified: Secondary | ICD-10-CM

## 2012-03-28 DIAGNOSIS — E872 Acidosis: Secondary | ICD-10-CM

## 2012-03-28 DIAGNOSIS — N179 Acute kidney failure, unspecified: Secondary | ICD-10-CM

## 2012-03-28 DIAGNOSIS — Z89611 Acquired absence of right leg above knee: Secondary | ICD-10-CM

## 2012-03-28 DIAGNOSIS — R0602 Shortness of breath: Secondary | ICD-10-CM | POA: Insufficient documentation

## 2012-03-28 DIAGNOSIS — E8729 Other acidosis: Secondary | ICD-10-CM

## 2012-03-28 DIAGNOSIS — R079 Chest pain, unspecified: Secondary | ICD-10-CM

## 2012-03-28 DIAGNOSIS — D72829 Elevated white blood cell count, unspecified: Secondary | ICD-10-CM

## 2012-03-28 DIAGNOSIS — A419 Sepsis, unspecified organism: Secondary | ICD-10-CM

## 2012-03-28 DIAGNOSIS — F329 Major depressive disorder, single episode, unspecified: Secondary | ICD-10-CM | POA: Insufficient documentation

## 2012-03-28 DIAGNOSIS — K224 Dyskinesia of esophagus: Principal | ICD-10-CM | POA: Insufficient documentation

## 2012-03-28 DIAGNOSIS — R791 Abnormal coagulation profile: Secondary | ICD-10-CM | POA: Insufficient documentation

## 2012-03-28 DIAGNOSIS — K219 Gastro-esophageal reflux disease without esophagitis: Secondary | ICD-10-CM | POA: Insufficient documentation

## 2012-03-28 DIAGNOSIS — J9600 Acute respiratory failure, unspecified whether with hypoxia or hypercapnia: Secondary | ICD-10-CM

## 2012-03-28 DIAGNOSIS — E1151 Type 2 diabetes mellitus with diabetic peripheral angiopathy without gangrene: Secondary | ICD-10-CM

## 2012-03-28 DIAGNOSIS — R0902 Hypoxemia: Secondary | ICD-10-CM

## 2012-03-28 LAB — POCT I-STAT, CHEM 8
BUN: 9 mg/dL (ref 6–23)
Calcium, Ion: 1.08 mmol/L — ABNORMAL LOW (ref 1.13–1.30)
Chloride: 102 mEq/L (ref 96–112)
Creatinine, Ser: 0.6 mg/dL (ref 0.50–1.35)
Glucose, Bld: 127 mg/dL — ABNORMAL HIGH (ref 70–99)
HCT: 47 % (ref 39.0–52.0)
Hemoglobin: 16 g/dL (ref 13.0–17.0)
Potassium: 3.8 mEq/L (ref 3.5–5.1)
Sodium: 139 mEq/L (ref 135–145)
TCO2: 28 mmol/L (ref 0–100)

## 2012-03-28 LAB — CBC
HCT: 43.6 % (ref 39.0–52.0)
Hemoglobin: 14.9 g/dL (ref 13.0–17.0)
MCH: 30.8 pg (ref 26.0–34.0)
MCHC: 34.2 g/dL (ref 30.0–36.0)
MCV: 90.3 fL (ref 78.0–100.0)
Platelets: 279 10*3/uL (ref 150–400)
RBC: 4.83 MIL/uL (ref 4.22–5.81)
RDW: 15.4 % (ref 11.5–15.5)
WBC: 12 10*3/uL — ABNORMAL HIGH (ref 4.0–10.5)

## 2012-03-28 LAB — PROTIME-INR
INR: 1.68 — ABNORMAL HIGH (ref 0.00–1.49)
Prothrombin Time: 19.2 seconds — ABNORMAL HIGH (ref 11.6–15.2)

## 2012-03-28 LAB — POCT I-STAT TROPONIN I: Troponin i, poc: 0 ng/mL (ref 0.00–0.08)

## 2012-03-28 MED ORDER — SODIUM CHLORIDE 0.9 % IV SOLN
INTRAVENOUS | Status: AC
  Administered 2012-03-28: via INTRAVENOUS
  Administered 2012-03-29: 100 mL/h via INTRAVENOUS

## 2012-03-28 NOTE — ED Notes (Signed)
Per EMS, pt has had esophagus issues in the past. Has had to have esophagus stretched. Today when pt was eating and when he swallowed then felt like he couldn't swallow. Pt started having pressure in his chest on the left side non-radiating. EMS gave 324 asa. Pt had 2 nitro en route and is now pain free. Vitals stable. Pt family has significant cardiac hx and early deaths from MI.

## 2012-03-28 NOTE — ED Notes (Signed)
Pt updated on wait.  °

## 2012-03-28 NOTE — ED Notes (Signed)
Radiology at bedside for PCXR

## 2012-03-28 NOTE — ED Notes (Signed)
Admitting physician at bedside

## 2012-03-28 NOTE — ED Provider Notes (Signed)
History     CSN: [id number]  Arrival date & time 03/28/12  [id number]   First MD Initiated Contact with Patient 03/28/12 2020      Chief Complaint  Patient presents with  . Chest Pain   Patient is a 77 y.o. male presenting with chest pain.  Chest Pain Associated symptoms: cough, dysphagia and nausea   Associated symptoms: no abdominal pain, no fever and not vomiting    77 y/o male with history of PVD s/p Bilateral AKA, DM, HTN, GERD s/p esophageal dilation in the past who presents with cc of chest pain. The patient states his symptoms began this morning. He states he ate a sausage biscuit and a soda and felt like "it got stuck". He states that later in the day he developed left sided chest "pressure". He states he has a strong family history of heart disease. He called EMS, who gave the patient ASA and 2 nitro which resolved his pain. He no longer has the feeling as if food is stuck in his esophagus.   Past Medical History  Diagnosis Date  . Diabetes mellitus   . Hypertension   . GERD (gastroesophageal reflux disease)   . Anxiety   . Depression   . Peripheral vascular disease   . Neuropathy     Past Surgical History  Procedure Laterality Date  . Leg amputation through knee  06/2004  . Abdominal aortic aneurysm repair  1988  . Back surgery  1988, 1989    No family history on file.  History  Substance Use Topics  . Smoking status: Former Smoker    Types: Cigarettes    Quit date: 08/06/1995  . Smokeless tobacco: Never Used  . Alcohol Use: No    Review of Systems  Constitutional: Negative for fever and chills.  HENT: Positive for trouble swallowing.   Respiratory: Positive for cough.   Cardiovascular: Positive for chest pain.  Gastrointestinal: Positive for nausea. Negative for vomiting and abdominal pain.  Genitourinary: Negative for dysuria and frequency.  Skin: Negative for rash.  All other systems reviewed and are negative.   Allergies  Dilaudid  Home  Medications   Current Outpatient Rx  Name  Route  Sig  Dispense  Refill  . acetaminophen (TYLENOL) 500 MG tablet   Oral   Take 1,000 mg by mouth every 6 (six) hours as needed for pain.         . clindamycin (CLEOCIN) 300 MG capsule   Oral   Take 300 mg by mouth 4 (four) times daily.         Marland Kitchen FLUoxetine (PROZAC) 20 MG capsule   Oral   Take 20 mg by mouth daily.         Marland Kitchen gabapentin (NEURONTIN) 300 MG capsule   Oral   Take 600 mg by mouth 2 (two) times daily.          Marland Kitchen glimepiride (AMARYL) 2 MG tablet   Oral   Take 2 mg by mouth daily before breakfast.         . HYDROcodone-acetaminophen (NORCO/VICODIN) 5-325 MG per tablet   Oral   Take 1 tablet by mouth every 8 (eight) hours as needed for pain.         . Menthol, Topical Analgesic, (MINERAL ICE) 2 % GEL   Apply externally   Apply 1 application topically 3 (three) times daily as needed (for extremity pain).         Marland Kitchen omeprazole (PRILOSEC) 20 MG capsule  Oral   Take 20 mg by mouth daily.          . simvastatin (ZOCOR) 40 MG tablet   Oral   Take 40 mg by mouth every evening.         . warfarin (COUMADIN) 4 MG tablet   Oral   Take 2-4 mg by mouth as directed. Takes 1/2 tablet on Sunday Tuesday and Thursday and 1 whole tablet all other days           BP 144/62  Pulse 83  Temp(Src) 98.6 F (37 C) (Oral)  Resp 22  SpO2 96%  Physical Exam  Constitutional: He is oriented to person, place, and time. He appears well-developed and well-nourished. No distress.  HENT:  Head: Normocephalic and atraumatic.  Eyes: Conjunctivae are normal. Pupils are equal, round, and reactive to light.  Neck: Normal range of motion. Neck supple.  Cardiovascular: Normal rate and regular rhythm.  Exam reveals no gallop and no friction rub.   No murmur heard. Pulmonary/Chest: Effort normal and breath sounds normal.  Abdominal: Soft. He exhibits no distension. There is no tenderness.  Musculoskeletal:  Bilateral AKA   Neurological: He is alert and oriented to person, place, and time.  Skin: Skin is warm and dry.  Psychiatric: He has a normal mood and affect.    ED Course  Procedures (including critical care time)  Labs Reviewed  CBC - Abnormal; Notable for the following:    WBC 12.0 (*)    All other components within normal limits  PROTIME-INR - Abnormal; Notable for the following:    Prothrombin Time 19.2 (*)    INR 1.68 (*)    All other components within normal limits  POCT I-STAT, CHEM 8 - Abnormal; Notable for the following:    Glucose, Bld 127 (*)    Calcium, Ion 1.08 (*)    All other components within normal limits  POCT I-STAT TROPONIN I   Dg Chest Port 1 View  03/28/2012  *RADIOLOGY REPORT*  Clinical Data: Chest pain.  Dysphagia.  PORTABLE CHEST - 1 VIEW  Comparison: 08/14/2011  Findings: The heart size and pulmonary vascularity are normal and the lungs are clear except for a tiny area of scarring at the left base laterally.  No significant osseous abnormality.  IMPRESSION: No acute disease.   Original Report Authenticated By: Francene Boyers, M.D.     Date: 03/29/2012  Rate: 90  Rhythm: normal sinus rhythm  QRS Axis: normal  Intervals: normal  ST/T Wave abnormalities: normal  Conduction Disutrbances:none  Narrative Interpretation:   Old EKG Reviewed: Now in sinus rythm   1. Chest pain   2. Nausea     MDM  77 y/o male with history of PVD s/p Bilateral AKA, DM, HTN, GERD s/p esophageal dilation in the past who presents with cc of chest pain, difficulty swallowing, and the sensation of food impaction in his lower esophagus. The patient states that when he swallows he gets nauseated but denies vomiting. Afebrile. VSS. EKG without acute ischemia. Initial troponin negative. Labs unremarkable. CXR normal. DDx: Food impaction, GERD, PUD, ACS. Due to risk factors for CAD and continued food impaction sensation will make NPO, start IVF and admit to medicine for ACS r/o and GI consult.           Shanon Ace, MD 03/29/12 [phone]

## 2012-03-29 ENCOUNTER — Inpatient Hospital Stay (HOSPITAL_COMMUNITY): Payer: Medicare Other

## 2012-03-29 DIAGNOSIS — E1159 Type 2 diabetes mellitus with other circulatory complications: Secondary | ICD-10-CM

## 2012-03-29 DIAGNOSIS — S78119A Complete traumatic amputation at level between unspecified hip and knee, initial encounter: Secondary | ICD-10-CM

## 2012-03-29 DIAGNOSIS — E1151 Type 2 diabetes mellitus with diabetic peripheral angiopathy without gangrene: Secondary | ICD-10-CM | POA: Diagnosis present

## 2012-03-29 DIAGNOSIS — R1310 Dysphagia, unspecified: Secondary | ICD-10-CM | POA: Diagnosis present

## 2012-03-29 DIAGNOSIS — I70209 Unspecified atherosclerosis of native arteries of extremities, unspecified extremity: Secondary | ICD-10-CM

## 2012-03-29 DIAGNOSIS — R079 Chest pain, unspecified: Secondary | ICD-10-CM | POA: Diagnosis present

## 2012-03-29 DIAGNOSIS — I82409 Acute embolism and thrombosis of unspecified deep veins of unspecified lower extremity: Secondary | ICD-10-CM

## 2012-03-29 LAB — TROPONIN I
Troponin I: <0.3 ng/mL (ref <0.30)
Troponin I: <0.3 ng/mL (ref <0.30)
Troponin I: <0.3 ng/mL (ref <0.30)

## 2012-03-29 LAB — GLUCOSE, CAPILLARY
Glucose-Capillary: 120 mg/dL — ABNORMAL HIGH (ref 70–99)
Glucose-Capillary: 113 mg/dL — ABNORMAL HIGH (ref 70–99)
Glucose-Capillary: 114 mg/dL — ABNORMAL HIGH (ref 70–99)
Glucose-Capillary: 113 mg/dL — ABNORMAL HIGH (ref 70–99)
Glucose-Capillary: 101 mg/dL — ABNORMAL HIGH (ref 70–99)
Glucose-Capillary: 100 mg/dL — ABNORMAL HIGH (ref 70–99)

## 2012-03-29 LAB — CBC
HCT: 41.4 % (ref 39.0–52.0)
Hemoglobin: 14 g/dL (ref 13.0–17.0)
MCH: 30.7 pg (ref 26.0–34.0)
MCHC: 33.8 g/dL (ref 30.0–36.0)
MCV: 90.8 fL (ref 78.0–100.0)
Platelets: 350 10*3/uL (ref 150–400)
RBC: 4.56 MIL/uL (ref 4.22–5.81)
RDW: 15.5 % (ref 11.5–15.5)
WBC: 9.8 10*3/uL (ref 4.0–10.5)

## 2012-03-29 LAB — BASIC METABOLIC PANEL
BUN: 10 mg/dL (ref 6–23)
CO2: 28 mEq/L (ref 19–32)
Calcium: 9.3 mg/dL (ref 8.4–10.5)
Chloride: 98 mEq/L (ref 96–112)
Creatinine, Ser: 0.67 mg/dL (ref 0.50–1.35)
GFR calc Af Amer: >90 mL/min (ref >90)
GFR calc non Af Amer: >90 mL/min (ref >90)
Glucose, Bld: 122 mg/dL — ABNORMAL HIGH (ref 70–99)
Potassium: 3.5 mEq/L (ref 3.5–5.1)
Sodium: 136 mEq/L (ref 135–145)

## 2012-03-29 LAB — PROTIME-INR
INR: 1.77 — ABNORMAL HIGH (ref 0.00–1.49)
Prothrombin Time: 20 seconds — ABNORMAL HIGH (ref 11.6–15.2)

## 2012-03-29 LAB — HEMOGLOBIN A1C
Hgb A1c MFr Bld: 6.7 % — ABNORMAL HIGH (ref <5.7)
Mean Plasma Glucose: 146 mg/dL — ABNORMAL HIGH (ref <117)

## 2012-03-29 LAB — CLOSTRIDIUM DIFFICILE BY PCR: Toxigenic C. Difficile by PCR: NEGATIVE

## 2012-03-29 MED ORDER — MENTHOL (TOPICAL ANALGESIC) 2 % EX GEL: 1.0000 "application " | Freq: Three times a day (TID) | CUTANEOUS | Status: DC | PRN

## 2012-03-29 MED ORDER — FLUOXETINE HCL 20 MG PO CAPS
20.0000 mg | ORAL_CAPSULE | Freq: Every day | ORAL | Status: DC
  Administered 2012-03-30: 20 mg via ORAL
  Filled 2012-03-29: qty 1

## 2012-03-29 MED ORDER — MORPHINE SULFATE 2 MG/ML IJ SOLN
2.0000 mg | Freq: Once | INTRAMUSCULAR | Status: AC
  Administered 2012-03-29: 2 mg via INTRAVENOUS
  Filled 2012-03-29: qty 1

## 2012-03-29 MED ORDER — WARFARIN - PHARMACIST DOSING INPATIENT: Freq: Every day | Status: DC

## 2012-03-29 MED ORDER — MUSCLE RUB 10-15 % EX CREA
TOPICAL_CREAM | Freq: Three times a day (TID) | CUTANEOUS | Status: DC | PRN
  Filled 2012-03-29: qty 85

## 2012-03-29 MED ORDER — GABAPENTIN 600 MG PO TABS
600.0000 mg | ORAL_TABLET | Freq: Two times a day (BID) | ORAL | Status: DC
  Administered 2012-03-29 – 2012-03-30 (×2): 600 mg via ORAL
  Filled 2012-03-29 (×3): qty 1

## 2012-03-29 MED ORDER — SODIUM CHLORIDE 0.9 % IV SOLN: INTRAVENOUS | Status: DC

## 2012-03-29 MED ORDER — HYDROCODONE-ACETAMINOPHEN 5-325 MG PO TABS
1.0000 | ORAL_TABLET | Freq: Three times a day (TID) | ORAL | Status: DC | PRN
  Administered 2012-03-29 – 2012-03-30 (×2): 1 via ORAL
  Filled 2012-03-29 (×2): qty 1

## 2012-03-29 MED ORDER — SODIUM CHLORIDE 0.9 % IJ SOLN
3.0000 mL | Freq: Two times a day (BID) | INTRAMUSCULAR | Status: DC
  Administered 2012-03-29: 3 mL via INTRAVENOUS

## 2012-03-29 MED ORDER — ONDANSETRON HCL 4 MG/2ML IJ SOLN: 4.0000 mg | Freq: Four times a day (QID) | INTRAMUSCULAR | Status: DC | PRN

## 2012-03-29 MED ORDER — SODIUM CHLORIDE 0.9 % IV SOLN
INTRAVENOUS | Status: DC
  Administered 2012-03-29 (×2): via INTRAVENOUS

## 2012-03-29 MED ORDER — PANTOPRAZOLE SODIUM 40 MG IV SOLR
40.0000 mg | INTRAVENOUS | Status: DC
  Administered 2012-03-29: 40 mg via INTRAVENOUS
  Filled 2012-03-29 (×2): qty 40

## 2012-03-29 MED ORDER — HEPARIN (PORCINE) IN NACL 100-0.45 UNIT/ML-% IJ SOLN
1400.0000 [IU]/h | INTRAMUSCULAR | Status: AC
  Administered 2012-03-29: 1200 [IU]/h via INTRAVENOUS
  Administered 2012-03-30: 1400 [IU]/h via INTRAVENOUS
  Filled 2012-03-29 (×2): qty 250

## 2012-03-29 MED ORDER — SIMVASTATIN 40 MG PO TABS
40.0000 mg | ORAL_TABLET | Freq: Every day | ORAL | Status: DC
  Administered 2012-03-29: 40 mg via ORAL
  Filled 2012-03-29 (×2): qty 1

## 2012-03-29 MED ORDER — ONDANSETRON HCL 4 MG PO TABS: 4.0000 mg | ORAL_TABLET | Freq: Four times a day (QID) | ORAL | Status: DC | PRN

## 2012-03-29 MED ORDER — MORPHINE SULFATE 2 MG/ML IJ SOLN: 2.0000 mg | INTRAMUSCULAR | Status: DC | PRN

## 2012-03-29 MED ORDER — LORATADINE 10 MG PO TABS
10.0000 mg | ORAL_TABLET | Freq: Every day | ORAL | Status: DC
  Administered 2012-03-29 – 2012-03-30 (×2): 10 mg via ORAL
  Filled 2012-03-29 (×2): qty 1

## 2012-03-29 MED ORDER — INSULIN ASPART 100 UNIT/ML ~~LOC~~ SOLN: 0.0000 [IU] | SUBCUTANEOUS | Status: DC

## 2012-03-29 MED ORDER — KETOROLAC TROMETHAMINE 30 MG/ML IJ SOLN
30.0000 mg | Freq: Once | INTRAMUSCULAR | Status: AC
  Administered 2012-03-29: 30 mg via INTRAVENOUS
  Filled 2012-03-29: qty 1

## 2012-03-29 NOTE — ED Notes (Signed)
Charge nurse to call [phone]

## 2012-03-29 NOTE — ED Notes (Signed)
Pt is having phantom pains in his lower extremities. Pt reports pain 10/10. Admitting physician paged for pain control orders.

## 2012-03-29 NOTE — Consult Note (Signed)
Referring Provider: Dr. Sharl Ma Primary Care Physician:  Astrid Divine, MD Primary Gastroenterologist:  Gentry Fitz  Reason for Consultation:  Dysphagia  HPI: Mark Russell is a 77 y.o. male who has a history of dysphagia and reports previous dilation for an esophageal stricture about 10 years ago. He has been having intermittent solid-food dysphagia over the years and then last night he developed the acute onset of sensation of food hanging up while eating a sausage biscuit and drinking a soda. Developed chest pressure with it and could not swallow any liquids after this episode. Was able to swallow saliva. Could not keep water down in the ER but has been able to swallow it today. Pain resolved after taking aspirin and 2 nitroglycerins. Denies abdominal pain. He had an EGD in 2012 by Dr. Matthias Hughs for a food impaction and after clearing of the food he was found to have a mild stricture and erosive esophagitis. Patient did not follow up to have dilation done. Barium swallow showed severe dysmotility and a mild distal esophageal narrowing but a 13 mm tablet passed without difficulty. He is on chronic Coumadin for a history of DVTs. He is s/p bilateral leg amputation (AKA).     Past Medical History  Diagnosis Date  . Diabetes mellitus   . Hypertension   . GERD (gastroesophageal reflux disease)   . Anxiety   . Depression   . Peripheral vascular disease   . Neuropathy     Past Surgical History  Procedure Laterality Date  . Leg amputation through knee  06/2004  . Abdominal aortic aneurysm repair  1988  . Back surgery  1988, 1989    Prior to Admission medications   Medication Sig Start Date End Date Taking? Authorizing Provider  acetaminophen (TYLENOL) 500 MG tablet Take 1,000 mg by mouth every 6 (six) hours as needed for pain.   Yes Historical Provider, MD  clindamycin (CLEOCIN) 300 MG capsule Take 300 mg by mouth 4 (four) times daily. 03/05/12  Yes Richardean Canal, MD  FLUoxetine (PROZAC)  20 MG capsule Take 20 mg by mouth daily.   Yes Historical Provider, MD  gabapentin (NEURONTIN) 300 MG capsule Take 600 mg by mouth 2 (two) times daily.    Yes Historical Provider, MD  glimepiride (AMARYL) 2 MG tablet Take 2 mg by mouth daily before breakfast.   Yes Historical Provider, MD  HYDROcodone-acetaminophen (NORCO/VICODIN) 5-325 MG per tablet Take 1 tablet by mouth every 8 (eight) hours as needed for pain.   Yes Historical Provider, MD  Menthol, Topical Analgesic, (MINERAL ICE) 2 % GEL Apply 1 application topically 3 (three) times daily as needed (for extremity pain).   Yes Historical Provider, MD  omeprazole (PRILOSEC) 20 MG capsule Take 20 mg by mouth daily.    Yes Historical Provider, MD  simvastatin (ZOCOR) 40 MG tablet Take 40 mg by mouth every evening.   Yes Historical Provider, MD  warfarin (COUMADIN) 4 MG tablet Take 2-4 mg by mouth as directed. Takes 1/2 tablet on Sunday Tuesday and Thursday and 1 whole tablet all other days   Yes Historical Provider, MD    Scheduled Meds: . [COMPLETED] sodium chloride   Intravenous STAT  . insulin aspart  0-15 Units Subcutaneous Q4H  . pantoprazole (PROTONIX) IV  40 mg Intravenous Q24H  . sodium chloride  3 mL Intravenous Q12H  . Warfarin - Pharmacist Dosing Inpatient   Does not apply q1800   Continuous Infusions: . sodium chloride 75 mL/hr at 03/29/12 0142  .  heparin     PRN Meds:.MUSCLE RUB, ondansetron (ZOFRAN) IV, ondansetron  Allergies as of 03/28/2012 - Review Complete 03/28/2012  Allergen Reaction Noted  . Morphine and related Other (See Comments) 08/06/2011    No family history on file.  History   Social History  . Marital Status: Married    Spouse Name: N/A    Number of Children: N/A  . Years of Education: N/A   Occupational History  . Not on file.   Social History Main Topics  . Smoking status: Former Smoker    Types: Cigarettes    Quit date: 08/06/1995  . Smokeless tobacco: Never Used  . Alcohol Use: No  .  Drug Use:   . Sexually Active: No   Other Topics Concern  . Not on file   Social History Narrative  . No narrative on file    Review of Systems: All negative except as stated above in HPI.  Physical Exam: Vital signs: Filed Vitals:   03/29/12 1443  BP: 149/78  Pulse: 81  Temp: 98.1 F (36.7 C)  Resp: 20   Last BM Date: 03/27/12 General:   Alert,  Well-developed, well-nourished, pleasant and cooperative in NAD Lungs:  Clear throughout to auscultation.   No wheezes, crackles, or rhonchi. No acute distress. Heart:  Regular rate and rhythm; no murmurs, clicks, rubs,  or gallops. Abdomen: soft, nontender, nondistended, +BS, umbilical hernia noted, healed midline surgical scar  Rectal:  Deferred  GI:  Lab Results:  Recent Labs  03/28/12 2024 03/28/12 2119 03/29/12 0158  WBC 12.0*  --  9.8  HGB 14.9 16.0 14.0  HCT 43.6 47.0 41.4  PLT 279  --  350   BMET  Recent Labs  03/28/12 2119 03/29/12 0158  NA 139 136  K 3.8 3.5  CL 102 98  CO2  --  28  GLUCOSE 127* 122*  BUN 9 10  CREATININE 0.60 0.67  CALCIUM  --  9.3   LFT No results found for this basename: PROT, ALBUMIN, AST, ALT, ALKPHOS, BILITOT, BILIDIR, IBILI,  in the last 72 hours PT/INR  Recent Labs  03/28/12 2024 03/29/12 0748  LABPROT 19.2* 20.0*  INR 1.68* 1.77*     Studies/Results: Dg Esophagus  03/29/2012  *RADIOLOGY REPORT*  Clinical Data: Dysphagia.  ESOPHOGRAM/BARIUM SWALLOW  Technique:  Single contrast examination was performed using thin barium.  The patient was observed with fluoroscopy swallowing a 13mm barium sulphate tablet.  Fluoroscopy time:  5 minutes and 24 seconds .  Comparison:  No priors.  Findings:  A limited single contrast barium esophagram was performed.  This demonstrated severe esophageal dysmotility with profound tertiary contractions.  No obstructing esophageal mass, ring or definite stricture was identified.  Immediately above the gastroesophageal junction involving the  distal 5 cm of the esophagus there is an area of mild narrowing which stayed relatively constant throughout the examination, however, a 13 mm barium tablet was administered which passed readily through into the stomach.  IMPRESSION: 1.  Severe esophageal motility disorder with strong tertiary contractions.   Original Report Authenticated By: Trudie Reed, M.D.    Dg Chest Port 1 View  03/28/2012  *RADIOLOGY REPORT*  Clinical Data: Chest pain.  Dysphagia.  PORTABLE CHEST - 1 VIEW  Comparison: 08/14/2011  Findings: The heart size and pulmonary vascularity are normal and the lungs are clear except for a tiny area of scarring at the left base laterally.  No significant osseous abnormality.  IMPRESSION: No acute disease.   Original  Report Authenticated By: Francene Boyers, M.D.     Impression/Plan: 77 yo with episode of what sounds like a food impaction yesterday that has cleared whose dysphagia is most likely mainly due to esophageal dysmotility. He had a mild stricture seen in 2012 and has mild narrowing on barium swallow and likely would benefit from an esophageal dilation. I stressed to him that his dysmotility cannot be reversed and dilation may help somewhat with his swallowing, but the main component of his dysphagia is likely his dysmotility. If his INR is < 1.7, then will do EGD with dilation tomorrow. Will hold Heparin for 2 hours prior to the dilation. Discussed risks/benefits with patient and he is agreeable to do the procedure.    LOS: 1 day   Brayli Klingbeil C.  03/29/2012, 3:40 PM

## 2012-03-29 NOTE — H&P (Signed)
History and Physical  Edith Lord RUE:[id number] DOB: [birth year] DOA: 03/28/2012   PCP: Astrid Divine, MD   Chief Complaint: difficulty swallowing and chest pain  HPI: 77 y/o male with history of PVD s/p Bilateral AKA, DM, HTN, GERD s/p esophageal dilation in the past who presents with chief complain  of chest pain.   He was in the usual state of health until the morning of his admission. He ate a sausage biscuit and a soda and felt like "it got stuck". He states that later in the day he developed left sided chest "pressure". He states he has a strong family history of heart disease we are both of his brother had heart attacks prior to age 19. He called EMS, who gave the patient ASA and 2 nitro which resolved his pain. He no longer has the feeling as if food is stuck in his esophagus, although we tried drinking water in the ER that he could not tolerate.  Patient denies any chest pain at this time. He is convinced that this is coming from his esophagus And requests to be seen by a gastroenterologist.patient does complain of shortness of breath this got better after he received aspirin and nitroglycerin. No abdominal pain, any changes in bowel movements, no change in urinary habits, no fever or chills noted.   Review of Systems:  Constitutional: Negative for fever and chills.  HENT: Positive for trouble swallowing.  Respiratory: Positive for cough. Negative for wheezing, DOE Cardiovascular: Positive for chest pain.  Gastrointestinal: Positive for nausea. Negative for vomiting and abdominal pain.  Genitourinary: Negative for dysuria and frequency.  Skin: Negative for rash.  All other systems reviewed and are negative   Past Medical History  Diagnosis Date  . Diabetes mellitus   . Hypertension   . GERD (gastroesophageal reflux disease)   . Anxiety   . Depression   . Peripheral vascular disease   . Neuropathy     Past Surgical History  Procedure Laterality Date  . Leg  amputation through knee  06/2004  . Abdominal aortic aneurysm repair  1988  . Back surgery  1988, 1989    Social History:  reports that he quit smoking about 16 years ago. His smoking use included Cigarettes. He smoked 0.00 packs per day. He has never used smokeless tobacco. He reports that he does not drink alcohol. His drug history is not on file.  Allergies  Allergen Reactions  . Dilaudid (Hydromorphone Hcl)     "Mean"    No family history on file.   Prior to Admission medications   Medication Sig Start Date End Date Taking? Authorizing Provider  acetaminophen (TYLENOL) 500 MG tablet Take 1,000 mg by mouth every 6 (six) hours as needed for pain.   Yes Historical Provider, MD  clindamycin (CLEOCIN) 300 MG capsule Take 300 mg by mouth 4 (four) times daily. 03/05/12  Yes Richardean Canal, MD  FLUoxetine (PROZAC) 20 MG capsule Take 20 mg by mouth daily.   Yes Historical Provider, MD  gabapentin (NEURONTIN) 300 MG capsule Take 600 mg by mouth 2 (two) times daily.    Yes Historical Provider, MD  glimepiride (AMARYL) 2 MG tablet Take 2 mg by mouth daily before breakfast.   Yes Historical Provider, MD  HYDROcodone-acetaminophen (NORCO/VICODIN) 5-325 MG per tablet Take 1 tablet by mouth every 8 (eight) hours as needed for pain.   Yes Historical Provider, MD  Menthol, Topical Analgesic, (MINERAL ICE) 2 % GEL Apply 1 application topically 3 (three) times  daily as needed (for extremity pain).   Yes Historical Provider, MD  omeprazole (PRILOSEC) 20 MG capsule Take 20 mg by mouth daily.    Yes Historical Provider, MD  simvastatin (ZOCOR) 40 MG tablet Take 40 mg by mouth every evening.   Yes Historical Provider, MD  warfarin (COUMADIN) 4 MG tablet Take 2-4 mg by mouth as directed. Takes 1/2 tablet on Sunday Tuesday and Thursday and 1 whole tablet all other days   Yes Historical Provider, MD   Physical Exam: Filed Vitals:   03/28/12 2058 03/28/12 2243 03/28/12 2353 03/29/12 0043  BP: 140/90 145/55 144/62  140/70  Pulse: 90 90 83 81  Temp:  98.6 F (37 C) 98.6 F (37 C) 98.6 F (37 C)  TempSrc:  Oral Oral Oral  Resp: 24 24 22 24   SpO2:  98% 96% 98%    Constitutional: He appears well-developed and well-nourished. No distress.  HEENT: Normocephalic and atraumatic.  Eyes: Conjunctivae are normal. Pupils are equal, round, and reactive to light.  Neck: Normal range of motion. Neck supple.  Cardiovascular: Normal rate and regular rhythm. Exam reveals no gallop and no friction rub.  No murmur heard.  Pulmonary/Chest: Effort normal and breath sounds normal.  Abdominal: Soft. He exhibits no distension. There is no tenderness.  Musculoskeletal: Bilateral AKA  Neurological: He is alert and oriented to person, place, and time.  Skin: Skin is warm and dry.  Psychiatric: He has a normal mood and affect   Wt Readings from Last 3 Encounters:  08/10/11 266 lb 5.1 oz (120.8 kg)    Labs on Admission:  Basic Metabolic Panel:  Recent Labs Lab 03/28/12 2119  NA 139  K 3.8  CL 102  GLUCOSE 127*  BUN 9  CREATININE 0.60     CBC:  Recent Labs Lab 03/28/12 2024 03/28/12 2119  WBC 12.0*  --   HGB 14.9 16.0  HCT 43.6 47.0  MCV 90.3  --   PLT 279  --     Troponin (Point of Care Test)  Recent Labs  03/28/12 2047  TROPIPOC 0.00    BNP (last 3 results)  Recent Labs  08/09/11 0910  PROBNP 1971.0*     Radiological Exams on Admission: Dg Chest Port 1 View  03/28/2012  *RADIOLOGY REPORT*  Clinical Data: Chest pain.  Dysphagia.  PORTABLE CHEST - 1 VIEW  Comparison: 08/14/2011  Findings: The heart size and pulmonary vascularity are normal and the lungs are clear except for a tiny area of scarring at the left base laterally.  No significant osseous abnormality.  IMPRESSION: No acute disease.   Original Report Authenticated By: Francene Boyers, M.D.     EKG: normal sinus rhythm, HR - 90 bpm, no acute ST- T wave changes   Principal Problem:   Dysphagia Active Problems:   Chest  pain   Diabetes type 2 with atherosclerosis of arteries of extremities   PVD (peripheral vascular disease)   S/P AKA (above knee amputation) bilateral   DVT (deep venous thrombosis)   Assessment/Plan  #Dysphagia: Patient reports having a history of esophageal stricture s/p dilation about 10-12 years ago. Differentials for presentation include recurrent esophageal stricture vs achalasia vs esophageal spasm vs malignancy. Given his history of stricture this most likely recurrent stricture . But with the progressive dysphagia to solids and then liquids and associated chest pain ,achalasia is also a possibility. Relief of pain with NTG also puts spasm, a likely casue.  - Admit to telemetry bed - NPO -  Supportive care: IVF and pain control -BSO study in the morning - Consult GI in AM  # Chest pain : This could be very well related to his dysphagia. But given his risk factors of HTN, HLD- would cycle CE to rule out ACS, though it appears less likely.  -12-lead EKG in the  #PVD S/P Bilateral AKA-  # DM: Hold amaryl. - Start SSI.  - Check AIC  #Depression: Hold Prozac for now as patient is unable to swallow  #H/o DVT: May need heparin drip was patient will be unable to swallow Coumadin. -Hold Coumadin for now  Code Status: Full Family Communication: Patient and family at the bedside were updated on the plan of care.  Disposition Plan/Anticipated LOS: Likely in 3-4 days  Time spent: 70 minutes  Lars Mage, MD  [facility] Team 5  If 7PM-7AM, please contact night-coverage at [email or web address], password [facility] 03/29/2012, 1:13 AM

## 2012-03-29 NOTE — ED Notes (Signed)
Pt does not want to wear blood pressure cuff or O2 monitor. When asked why, pt states that "they get on my nerves." RN and pt came to agreement to let RN check vitals approx every hour.

## 2012-03-29 NOTE — ED Notes (Signed)
Per Admitting Eben Burow, give 2mg  morphine. Pt has allergy to morphine listed in chart, RN confirmed with Dr. Eben Burow and patient that he is allergic to dilaudid, not morphine. Allergies in chart are listed in error. RN to update allergies.

## 2012-03-29 NOTE — Care Management Note (Unsigned)
    Page 1 of 1   03/29/2012     4:13:01 PM   CARE MANAGEMENT NOTE 03/29/2012  Patient:  Mark Russell, Mark Russell   Account Number:  [id number]  Date Initiated:  03/29/2012  Documentation initiated by:  Najmo Pardue  Subjective/Objective Assessment:   PT ADM ON 03/28/12 WITH DYSPHAGIA, CP.  PTA, PT RESIDES AT HOME AND IS FAIRLY INDEPPENDENT...HE IS A BILAT AKA; STILL DRIVES AND USES ELECTRIC WC.     Action/Plan:   MET WITH PT AND DTR TO DISCUSS DC NEEDS.  PT DENIES ANY HOME NEEDS.   Anticipated DC Date:  03/29/2012   Anticipated DC Plan:  HOME/SELF CARE      DC Planning Services  CM consult      Choice offered to / List presented to:             Status of service:  In process, will continue to follow Medicare Important Message given?   (If response is "NO", the following Medicare IM given date fields will be blank) Date Medicare IM given:   Date Additional Medicare IM given:    Discharge Disposition:    Per UR Regulation:  Reviewed for med. necessity/level of care/duration of stay  If discussed at Long Length of Stay Meetings, dates discussed:    Comments:

## 2012-03-29 NOTE — Progress Notes (Signed)
[facility] PROGRESS NOTE  Mark Russell XWR:[id number] DOB: 1935-07-07 DOA: 03/28/2012 PCP: Astrid Divine, MD    Interval history 77 y/o male with history of PVD s/p Bilateral AKA, DM, HTN, GERD s/p esophageal dilation in the past who presents with chief complain of chest pain.  He was in the usual state of health until the morning of his admission. He ate a sausage biscuit and a soda and felt like "it got stuck". He states that later in the day he developed left sided chest "pressure". He states he has a strong family history of heart disease we are both of his brother had heart attacks prior to age 68. He called EMS, who gave the patient ASA and 2 nitro which resolved his pain. He no longer has the feeling as if food is stuck in his esophagus, although we tried drinking water in the ER that he could not tolerate.  Patient denies any chest pain at this time. He is convinced that this is coming from his esophagus And requests to be seen by a gastroenterologist.patient does complain of shortness of breath this got better after he received aspirin and nitroglycerin. No abdominal pain, any changes in bowel movements, no change in urinary habits, no fever or chills noted Assessment/Plan:  Dysphagia: Patient reports having a history of esophageal stricture s/p dilation about 10-12 years ago. Patient underwent esophagram/barium swallow, which showed severe esophageal dysmotility disorder with tertiary contractions.  Chest pain : This could be very well related to his dysphagia. Cardiac enzymes x 3 are negative. EKG shows occasional pvc's  PVD S/P Bilateral AKA-    DM: Hold amaryl.  - Start SSI.   Diarrhea: patient has taken over 20 days of clindamycin for left eyelid cellulitis, will check stool for C diff  Left eyelid welling: very mild erythema with edema, does not appear infected, he has already completed 20 days of antibiotics with Clindamycin Will start allegra for  allergic/inflammatory reaction to ? Insect bite.  Depression: restart prozac  Phantom pain: Will restart the home medications of gabapentin and Vicodin  H/o DVT: Started on heparin drip per pharmacy -Hold Coumadin for now   Code Status: Full code Family Communication: discussed with patient Disposition Plan: Home after endoscopy   Consultants:  GI  Procedures: Esophagram  Antibiotics:  None  HPI/Subjective:  Today c/o phantom pain, want his home meds to be restarted. Also had one loose BM today, he has been on po clindamycin for more than 20 days for cellulitis of face  Objective: Filed Vitals:   03/29/12 0043 03/29/12 0131 03/29/12 0623 03/29/12 1443  BP: 140/70 154/78 140/80 149/78  Pulse: 81 84 83 81  Temp: 98.6 F (37 C) 97.4 F (36.3 C) 98.6 F (37 C) 98.1 F (36.7 C)  TempSrc: Oral Oral Oral Oral  Resp: 24 20 21 20   Height:  4' (1.219 m)    Weight:  107 kg (235 lb 14.3 oz)    SpO2: 98% 97% 96% 95%    Intake/Output Summary (Last 24 hours) at 03/29/12 1701 Last data filed at 03/29/12 0900  Gross per 24 hour  Intake      0 ml  Output    650 ml  Net   -650 ml   Filed Weights   03/29/12 0131  Weight: 107 kg (235 lb 14.3 oz)    Exam:   General:  Appear in no acute distress  HEENT; mild edema noted in the left upper eyelid, with very faint erythema  Cardiovascular: s1s2 RRR  Respiratory: Clear bialterally  Abdomen: Soft, nontender, no organomegaly  Musculoskeletal: s/p BL AKA    Data Reviewed: Basic Metabolic Panel:  Recent Labs Lab 03/28/12 2119 03/29/12 0158  NA 139 136  K 3.8 3.5  CL 102 98  CO2  --  28  GLUCOSE 127* 122*  BUN 9 10  CREATININE 0.60 0.67  CALCIUM  --  9.3   Liver Function Tests: No results found for this basename: AST, ALT, ALKPHOS, BILITOT, PROT, ALBUMIN,  in the last 168 hours No results found for this basename: LIPASE, AMYLASE,  in the last 168 hours No results found for this basename: AMMONIA,  in the  last 168 hours CBC:  Recent Labs Lab 03/28/12 2024 03/28/12 2119 03/29/12 0158  WBC 12.0*  --  9.8  HGB 14.9 16.0 14.0  HCT 43.6 47.0 41.4  MCV 90.3  --  90.8  PLT 279  --  350   Cardiac Enzymes:  Recent Labs Lab 03/29/12 0157 03/29/12 0739 03/29/12 1348  TROPONINI <0.30 <0.30 <0.30   BNP (last 3 results)  Recent Labs  08/09/11 0910  PROBNP 1971.0*   CBG:  Recent Labs Lab 03/29/12 0142 03/29/12 0424 03/29/12 0742 03/29/12 1214 03/29/12 1619  GLUCAP 120* 113* 114* 113* 101*    No results found for this or any previous visit (from the past 240 hour(s)).   Studies: Dg Esophagus  03/29/2012  *RADIOLOGY REPORT*  Clinical Data: Dysphagia.  ESOPHOGRAM/BARIUM SWALLOW  Technique:  Single contrast examination was performed using thin barium.  The patient was observed with fluoroscopy swallowing a 13mm barium sulphate tablet.  Fluoroscopy time:  5 minutes and 24 seconds .  Comparison:  No priors.  Findings:  A limited single contrast barium esophagram was performed.  This demonstrated severe esophageal dysmotility with profound tertiary contractions.  No obstructing esophageal mass, ring or definite stricture was identified.  Immediately above the gastroesophageal junction involving the distal 5 cm of the esophagus there is an area of mild narrowing which stayed relatively constant throughout the examination, however, a 13 mm barium tablet was administered which passed readily through into the stomach.  IMPRESSION: 1.  Severe esophageal motility disorder with strong tertiary contractions.   Original Report Authenticated By: Trudie Reed, M.D.    Dg Chest Port 1 View  03/28/2012  *RADIOLOGY REPORT*  Clinical Data: Chest pain.  Dysphagia.  PORTABLE CHEST - 1 VIEW  Comparison: 08/14/2011  Findings: The heart size and pulmonary vascularity are normal and the lungs are clear except for a tiny area of scarring at the left base laterally.  No significant osseous abnormality.   IMPRESSION: No acute disease.   Original Report Authenticated By: Francene Boyers, M.D.     Scheduled Meds: . [COMPLETED] sodium chloride   Intravenous STAT  . [START ON 03/30/2012] FLUoxetine  20 mg Oral Daily  . gabapentin  600 mg Oral BID  . insulin aspart  0-15 Units Subcutaneous Q4H  . pantoprazole (PROTONIX) IV  40 mg Intravenous Q24H  . simvastatin  40 mg Oral q1800  . sodium chloride  3 mL Intravenous Q12H   Continuous Infusions: . sodium chloride 75 mL/hr at 03/29/12 0142  . heparin      Principal Problem:   Dysphagia Active Problems:   PVD (peripheral vascular disease)   S/P AKA (above knee amputation) bilateral   DVT (deep venous thrombosis)   Chest pain   Diabetes type 2 with atherosclerosis of arteries of extremities  Time spent: 35 min    [facility]  [facility] Pager [phone]. If 7PM-7AM, please contact night-coverage at [email or web address], password [facility] 03/29/2012, 5:01 PM  LOS: 1 day

## 2012-03-29 NOTE — Progress Notes (Signed)
ANTICOAGULATION CONSULT NOTE - Initial Consult  Pharmacy Consult for Coumadin Indication: h/o DVT  Allergies  Allergen Reactions  . Dilaudid (Hydromorphone Hcl)     "Mean"    Patient Measurements: Weight: 235 lb 14.3 oz (107 kg)  Vital Signs: Temp: 97.4 F (36.3 C) (03/25 0131) Temp src: Oral (03/25 0131) BP: 154/78 mmHg (03/25 0131) Pulse Rate: 84 (03/25 0131)  Labs:  Recent Labs  03/28/12 2024 03/28/12 2119  HGB 14.9 16.0  HCT 43.6 47.0  PLT 279  --   LABPROT 19.2*  --   INR 1.68*  --   CREATININE  --  0.60    The CrCl is unknown because both a height and weight (above a minimum accepted value) are required for this calculation.   Medical History: Past Medical History  Diagnosis Date  . Diabetes mellitus   . Hypertension   . GERD (gastroesophageal reflux disease)   . Anxiety   . Depression   . Peripheral vascular disease   . Neuropathy     Medications:  Prescriptions prior to admission  Medication Sig Dispense Refill  . acetaminophen (TYLENOL) 500 MG tablet Take 1,000 mg by mouth every 6 (six) hours as needed for pain.      . clindamycin (CLEOCIN) 300 MG capsule Take 300 mg by mouth 4 (four) times daily.      Marland Kitchen FLUoxetine (PROZAC) 20 MG capsule Take 20 mg by mouth daily.      Marland Kitchen gabapentin (NEURONTIN) 300 MG capsule Take 600 mg by mouth 2 (two) times daily.       Marland Kitchen glimepiride (AMARYL) 2 MG tablet Take 2 mg by mouth daily before breakfast.      . HYDROcodone-acetaminophen (NORCO/VICODIN) 5-325 MG per tablet Take 1 tablet by mouth every 8 (eight) hours as needed for pain.      . Menthol, Topical Analgesic, (MINERAL ICE) 2 % GEL Apply 1 application topically 3 (three) times daily as needed (for extremity pain).      Marland Kitchen omeprazole (PRILOSEC) 20 MG capsule Take 20 mg by mouth daily.       . simvastatin (ZOCOR) 40 MG tablet Take 40 mg by mouth every evening.      . warfarin (COUMADIN) 4 MG tablet Take 2-4 mg by mouth as directed. Takes 1/2 tablet on Sunday  Tuesday and Thursday and 1 whole tablet all other days        Assessment: 77 yo male admitted with dysphagia, h/o DVT, to continue anticoagulation.  Pt currently unable to swallow, so may give Coumadin IV if necessary. Goal of Therapy:  INR 2-3 Monitor platelets by anticoagulation protocol: Yes   Plan:  Daily INR  Delylah Stanczyk, Gary Fleet 03/29/2012,2:38 AM

## 2012-03-29 NOTE — Progress Notes (Signed)
ANTICOAGULATION CONSULT NOTE - Follow Up Consult  Pharmacy Consult for Heparin Indication: hx DVT  Allergies  Allergen Reactions  . Dilaudid (Hydromorphone Hcl)     "Mean"  . Morphine And Related Other (See Comments)    "mean"    Patient Measurements: Height: 4' (121.9 cm) Weight: 235 lb 14.3 oz (107 kg) IBW/kg (Calculated) : 22.4 Heparin Dosing Weight: 75 kg  Vital Signs: Temp: 98.1 F (36.7 C) (03/25 1443) Temp src: Oral (03/25 1443) BP: 149/78 mmHg (03/25 1443) Pulse Rate: 81 (03/25 1443)  Labs:  Recent Labs  03/28/12 2024 03/28/12 2119 03/29/12 0157 03/29/12 0158 03/29/12 0739 03/29/12 0748 03/29/12 1348  HGB 14.9 16.0  --  14.0  --   --   --   HCT 43.6 47.0  --  41.4  --   --   --   PLT 279  --   --  350  --   --   --   LABPROT 19.2*  --   --   --   --  20.0*  --   INR 1.68*  --   --   --   --  1.77*  --   CREATININE  --  0.60  --  0.67  --   --   --   TROPONINI  --   --  <0.30  --  <0.30  --  <0.30    Estimated Creatinine Clearance: 64.4 ml/min (by C-G formula based on Cr of 0.67).  Assessment:  Coumadin now to be held, heparin to begin.  Last Coumadin dose 3/24, but patient reports "came back up".  Has been on Coumadin for hx DVTs in legs, per patient. Bilateral AKA. Home Coumadin regimen:  4 mg MWFSat, 2 mg TTSun.  107 kg, will use adjusted dosing weight of 75 kg.  Discussed briefly with Dr. Cheryl Flash.  For EGD with dilation 3/26 at 9am, if INR < 1.7.  Heparin drip to be held for 2 hours prior to procedure.  Goal of Therapy:  Heparin level 0.3-0.7 units/ml Monitor platelets by anticoagulation protocol: Yes   Plan:   Begin heparin drip at 1200 units/hr.  Heparin level ~ 6 hrs after drip begins.  Daily heparin level, CBC and PT/INR.  Hold heparin 2 hrs prior to procedure on 3/26.  Dennie Fetters, [place] Pager: [phone] 03/29/2012,3:42 PM

## 2012-03-29 NOTE — Progress Notes (Signed)
03/29/12 Unable to find proof of positive VRE culture from 02/04/10 at this time - patient unflagged - No isolation required at this time.

## 2012-03-29 NOTE — ED Notes (Signed)
Pt is denying CP at this time, states his pain is his phantom pain

## 2012-03-30 ENCOUNTER — Encounter (HOSPITAL_COMMUNITY)
Admission: EM | Disposition: A | Discharge status: Home / Self Care | Payer: Self-pay | Source: Home / Self Care | Attending: Family Medicine

## 2012-03-30 LAB — CBC
HCT: 43.1 % (ref 39.0–52.0)
Hemoglobin: 14.3 g/dL (ref 13.0–17.0)
MCH: 30.5 pg (ref 26.0–34.0)
MCHC: 33.2 g/dL (ref 30.0–36.0)
MCV: 91.9 fL (ref 78.0–100.0)
Platelets: 332 10*3/uL (ref 150–400)
RBC: 4.69 MIL/uL (ref 4.22–5.81)
RDW: 15.7 % — ABNORMAL HIGH (ref 11.5–15.5)
WBC: 9.2 10*3/uL (ref 4.0–10.5)

## 2012-03-30 LAB — GLUCOSE, CAPILLARY
Glucose-Capillary: 103 mg/dL — ABNORMAL HIGH (ref 70–99)
Glucose-Capillary: 96 mg/dL (ref 70–99)
Glucose-Capillary: 140 mg/dL — ABNORMAL HIGH (ref 70–99)

## 2012-03-30 LAB — PROTIME-INR
INR: 1.83 — ABNORMAL HIGH (ref 0.00–1.49)
Prothrombin Time: 20.5 seconds — ABNORMAL HIGH (ref 11.6–15.2)

## 2012-03-30 LAB — HEPARIN LEVEL (UNFRACTIONATED)
Heparin Unfractionated: 0.13 IU/mL — ABNORMAL LOW (ref 0.30–0.70)
Heparin Unfractionated: 0.24 IU/mL — ABNORMAL LOW (ref 0.30–0.70)

## 2012-03-30 SURGERY — EGD (ESOPHAGOGASTRODUODENOSCOPY)
Anesthesia: Moderate Sedation

## 2012-03-30 MED ORDER — LOPERAMIDE HCL 2 MG PO CAPS
2.0000 mg | ORAL_CAPSULE | Freq: Once | ORAL | Status: AC
  Administered 2012-03-30: 2 mg via ORAL
  Filled 2012-03-30: qty 1

## 2012-03-30 MED ORDER — PANTOPRAZOLE SODIUM 40 MG PO TBEC
40.0000 mg | DELAYED_RELEASE_TABLET | Freq: Every day | ORAL | Status: DC
  Administered 2012-03-30: 40 mg via ORAL
  Filled 2012-03-30: qty 1

## 2012-03-30 MED ORDER — LORATADINE 10 MG PO TABS: 10.0000 mg | ORAL_TABLET | Freq: Every day | ORAL | Status: DC

## 2012-03-30 MED ORDER — INSULIN ASPART 100 UNIT/ML ~~LOC~~ SOLN: 0.0000 [IU] | Freq: Every day | SUBCUTANEOUS | Status: DC

## 2012-03-30 MED ORDER — WARFARIN SODIUM 4 MG PO TABS
4.0000 mg | ORAL_TABLET | Freq: Once | ORAL | Status: AC
  Administered 2012-03-30: 4 mg via ORAL
  Filled 2012-03-30: qty 1

## 2012-03-30 MED ORDER — INSULIN ASPART 100 UNIT/ML ~~LOC~~ SOLN
0.0000 [IU] | Freq: Three times a day (TID) | SUBCUTANEOUS | Status: DC
  Administered 2012-03-30: 2 [IU] via SUBCUTANEOUS

## 2012-03-30 MED ORDER — WARFARIN - PHARMACIST DOSING INPATIENT: Freq: Every day | Status: DC

## 2012-03-30 NOTE — Progress Notes (Signed)
ANTICOAGULATION CONSULT NOTE - Follow Up Consult  Pharmacy Consult for Heparin and Coumadin Indication: hx DVT  Allergies  Allergen Reactions  . Dilaudid (Hydromorphone Hcl)     "Mean"  . Morphine And Related Other (See Comments)    "mean"    Patient Measurements: Height: 4' (121.9 cm) Weight: 235 lb 14.3 oz (107 kg) IBW/kg (Calculated) : 22.4 Heparin Dosing Weight: 75 kg  Vital Signs: Temp: 98.3 F (36.8 C) (03/26 0413) Temp src: Oral (03/26 0413) BP: 144/72 mmHg (03/26 0413) Pulse Rate: 95 (03/26 0413)  Labs:  Recent Labs  03/28/12 2024 03/28/12 2119 03/29/12 0157 03/29/12 0158 03/29/12 0739 03/29/12 0748 03/29/12 1348 03/29/12 2352 03/30/12 0632  HGB 14.9 16.0  --  14.0  --   --   --   --  14.3  HCT 43.6 47.0  --  41.4  --   --   --   --  43.1  PLT 279  --   --  350  --   --   --   --  332  LABPROT 19.2*  --   --   --   --  20.0*  --   --  20.5*  INR 1.68*  --   --   --   --  1.77*  --   --  1.83*  HEPARINUNFRC  --   --   --   --   --   --   --  0.13* 0.24*  CREATININE  --  0.60  --  0.67  --   --   --   --   --   TROPONINI  --   --  <0.30  --  <0.30  --  <0.30  --   --     Estimated Creatinine Clearance: 64.4 ml/min (by C-G formula based on Cr of 0.67).  Assessment:  Heparin held at 7am for possible EGD and dilation, but cancelled due to INR up to 1.83. Now planning as outpatient.  Last Coumadin dose 3/24, but patient reports "came back up".  Has been on Coumadin for hx DVTs in legs, per patient. Bilateral AKA. On Coumadin since the 1990's.  Home Coumadin regimen:  4 mg MWFSat, 2 mg TTSun.   No problems eating breakfast late morning.   Goal of Therapy:  INR 2-3 Heparin level 0.3-0.7 units/ml Monitor platelets by anticoagulation protocol: Yes   Plan:   Will resume Coumadin with usual 4 mg today. Will give this morning, rather than waiting until usual 6pm dosing time.  Likely will need 4 mg again 3/27, instead of usual 2 mg dose on Thursday, then would  continue prior home regimen.  Heparin not resumed, with discharge planned.  Dennie Fetters, RPh Pager: [phone] 03/30/2012,10:27 AM

## 2012-03-30 NOTE — Progress Notes (Signed)
Patient ID: Mark Russell, male   DOB: [birth year], 77 y.o.   MRN: [id number] [facility] Progress Note  Mark Russell 77 y.o. 1935/05/13   Subjective: EGD procedure cancelled due to INR 1.83. Patient wants to go home and have procedure done as an outpt.  Objective: Vital signs in last 24 hours: Filed Vitals:   03/30/12 0413  BP: 144/72  Pulse: 95  Temp: 98.3 F (36.8 C)  Resp: 20    Physical Exam: Gen: alert, no acute distress  Lab Results:  Recent Labs  03/28/12 2119 03/29/12 0158  NA 139 136  K 3.8 3.5  CL 102 98  CO2  --  28  GLUCOSE 127* 122*  BUN 9 10  CREATININE 0.60 0.67  CALCIUM  --  9.3   No results found for this basename: AST, ALT, ALKPHOS, BILITOT, PROT, ALBUMIN,  in the last 72 hours  Recent Labs  03/29/12 0158 03/30/12 0632  WBC 9.8 9.2  HGB 14.0 14.3  HCT 41.4 43.1  MCV 90.8 91.9  PLT 350 332    Recent Labs  03/29/12 0748 03/30/12 0632  LABPROT 20.0* 20.5*  INR 1.77* 1.83*      Assessment/Plan: 77 yo with dysphagia and mild distal esophageal narrowing and severe esophageal dysmotility. EGD cancelled due to elevated INR and plan was to do EGD tomorrow if INR has decreased, but patient does not want to wait another day in the hospital and wants to have it as an outpt. If he can tolerate regular diet, then ok to go home from GI standpoint and f/u with me in 3-4 weeks. Need for lifelong coumadin will have to be readdressed by his PCP.   Mark Russell C. 03/30/2012, 8:50 AM

## 2012-03-30 NOTE — ED Provider Notes (Signed)
  I performed a history and physical examination of Mark Russell and discussed his management with Dr. Ethelene Browns.  I agree with the history, physical, assessment, and plan of care, with the following exceptions: None  I was present for the following procedures: None Time Spent in Critical Care of the patient: None Time spent in discussions with the patient and family: 7  Mark Russell S    Hilario Quarry, MD 03/30/12 1352

## 2012-03-30 NOTE — Progress Notes (Signed)
ANTICOAGULATION CONSULT NOTE  Pharmacy Consult for Heparin Indication: hx DVT  Allergies  Allergen Reactions  . Dilaudid (Hydromorphone Hcl)     "Mean"  . Morphine And Related Other (See Comments)    "mean"    Patient Measurements: Height: 4' (121.9 cm) Weight: 235 lb 14.3 oz (107 kg) IBW/kg (Calculated) : 22.4 Heparin Dosing Weight: 75 kg  Vital Signs: Temp: 98.5 F (36.9 C) (03/25 1911) Temp src: Oral (03/25 1911) BP: 164/81 mmHg (03/25 1911) Pulse Rate: 90 (03/25 1911)  Labs:  Recent Labs  03/28/12 2024 03/28/12 2119 03/29/12 0157 03/29/12 0158 03/29/12 0739 03/29/12 0748 03/29/12 1348 03/29/12 2352  HGB 14.9 16.0  --  14.0  --   --   --   --   HCT 43.6 47.0  --  41.4  --   --   --   --   PLT 279  --   --  350  --   --   --   --   LABPROT 19.2*  --   --   --   --  20.0*  --   --   INR 1.68*  --   --   --   --  1.77*  --   --   HEPARINUNFRC  --   --   --   --   --   --   --  0.13*  CREATININE  --  0.60  --  0.67  --   --   --   --   TROPONINI  --   --  <0.30  --  <0.30  --  <0.30  --     Estimated Creatinine Clearance: 64.4 ml/min (by C-G formula based on Cr of 0.67).  Assessment: Goal of Therapy:  Heparin level 0.3-0.7 units/ml Monitor platelets by anticoagulation protocol: Yes   Plan:  Increase Heparin 1400 units/hr F/U INR in am for EGD plans  Mark Russell,03/30/2012,12:57 AM

## 2012-03-30 NOTE — Discharge Summary (Addendum)
Physician Discharge Summary  Mark Russell OZH:[id number] DOB: 03-Nov-1935 DOA: 03/28/2012  PCP: Astrid Divine, MD  Admit date: 03/28/2012 Discharge date: 03/30/2012  Time spent: 50 minutes  Recommendations for Outpatient Follow-up:  1. PCP in 2 weeks to discuss the coumadin indication, as it has to be stopped five days before EGD   Discharge Diagnoses:  Principal Problem:   Dysphagia Active Problems:   PVD (peripheral vascular disease)   S/P AKA (above knee amputation) bilateral   DVT (deep venous thrombosis)   Chest pain   Diabetes type 2 with atherosclerosis of arteries of extremities   Discharge Condition: Stable  Diet recommendation: Diabetic diet  Filed Weights   03/29/12 0131  Weight: 107 kg (235 lb 14.3 oz)    History of present illness:  77 y/o male with history of PVD s/p Bilateral AKA, DM, HTN, GERD s/p esophageal dilation in the past who presents with chief complain of chest pain.  He was in the usual state of health until the morning of his admission. He ate a sausage biscuit and a soda and felt like "it got stuck". He states that later in the day he developed left sided chest "pressure". He states he has a strong family history of heart disease we are both of his brother had heart attacks prior to age 31. He called EMS, who gave the patient ASA and 2 nitro which resolved his pain. He no longer has the feeling as if food is stuck in his esophagus, although we tried drinking water in the ER that he could not tolerate.  Patient denies any chest pain at this time. He is convinced that this is coming from his esophagus And requests to be seen by a gastroenterologist.patient does complain of shortness of breath this got better after he received aspirin and nitroglycerin. No abdominal pain, any changes in bowel movements, no change in urinary habits, no fever or chills noted   Hospital Course:  Dysphagia: Patient reports having a history of esophageal stricture s/p  dilation about 10-12 years ago. Patient underwent esophagram/barium swallow, which showed severe esophageal dysmotility disorder with tertiary contractions. At this time patient does not want to stay in the hospital as his INR is 1.83, and he needs the INR to be below 1.7 for EGD dilation. He ill follow with [facility] as outpatient. Patient was given regular diet and was able to swallow without difficulty.  Chest pain : This could be very well related to his dysphagia. Cardiac enzymes x 3 are negative.   PVD S/P Bilateral AKA-   DM: Will resume amaryl on discharge  Diarrhea: patient has taken over 20 days of clindamycin for left eyelid cellulitis, Stool for C diff is negative. Imodium was given in the hospital. Most likely the diarrhea is antibiotic induced.  Left eyelid welling: Improving with claritin,? Allergic,  very mild erythema with edema, does not appear infected, he has already completed 20 days of antibiotics with Clindamycin. He already has developed diarrhea and is at risk of developing pseudomembranous colitis with continue clindamycin use, No more antibiotics are required, will send him home on Claritin for 2 weeks.  Depression: restart prozac   Phantom pain: Continue  home medications of gabapentin and Vicodin   H/o DVT: Started on heparin drip per pharmacy  Will d/c heparin and he can restart Coumadin      Procedures: Esophagram  Consultations:  Gastroeneterology   Discharge Exam: Filed Vitals:   03/29/12 6295 03/29/12 1443 03/29/12 1911 03/30/12 0413  BP: 140/80 149/78 164/81 144/72  Pulse: 83 81 90 95  Temp: 98.6 F (37 C) 98.1 F (36.7 C) 98.5 F (36.9 C) 98.3 F (36.8 C)  TempSrc: Oral Oral Oral Oral  Resp: 21 20 20 20   Height:      Weight:      SpO2: 96% 95% 93% 94%    General: appear in no acute distress Cardiovascular: s1s2 RRR Respiratory: Clear bilaterally Ext: No edema  Discharge Instructions  Discharge Orders   Future Orders Complete By  Expires     Diet - low sodium heart healthy  As directed     Increase activity slowly  As directed         Medication List    STOP taking these medications       clindamycin 300 MG capsule  Commonly known as:  CLEOCIN      TAKE these medications       acetaminophen 500 MG tablet  Commonly known as:  TYLENOL  Take 1,000 mg by mouth every 6 (six) hours as needed for pain.     FLUoxetine 20 MG capsule  Commonly known as:  PROZAC  Take 20 mg by mouth daily.     gabapentin 300 MG capsule  Commonly known as:  NEURONTIN  Take 600 mg by mouth 2 (two) times daily.     glimepiride 2 MG tablet  Commonly known as:  AMARYL  Take 2 mg by mouth daily before breakfast.     HYDROcodone-acetaminophen 5-325 MG per tablet  Commonly known as:  NORCO/VICODIN  Take 1 tablet by mouth every 8 (eight) hours as needed for pain.     loratadine 10 MG tablet  Commonly known as:  CLARITIN  Take 1 tablet (10 mg total) by mouth daily.     MINERAL ICE 2 % Gel  Generic drug:  Menthol (Topical Analgesic)  Apply 1 application topically 3 (three) times daily as needed (for extremity pain).     omeprazole 20 MG capsule  Commonly known as:  PRILOSEC  Take 20 mg by mouth daily.     simvastatin 40 MG tablet  Commonly known as:  ZOCOR  Take 40 mg by mouth every evening.     warfarin 4 MG tablet  Commonly known as:  COUMADIN  Take 2-4 mg by mouth as directed. Takes 1/2 tablet on Sunday Tuesday and Thursday and 1 whole tablet all other days          The results of significant diagnostics from this hospitalization (including imaging, microbiology, ancillary and laboratory) are listed below for reference.    Significant Diagnostic Studies: Dg Esophagus  03/29/2012  *RADIOLOGY REPORT*  Clinical Data: Dysphagia.  ESOPHOGRAM/BARIUM SWALLOW  IMPRESSION: 1.  Severe esophageal motility disorder with strong tertiary contractions.   Original Report Authenticated By: Trudie Reed, M.D.    Dg Chest Port  1 View  03/28/2012  *RADIOLOGY REPORT*  Clinical Data: Chest pain.  Dysphagia.  PORTABLE CHEST - 1 VIEW  Comparison: 08/14/2011  Findings: The heart size and pulmonary vascularity are normal and the lungs are clear except for a tiny area of scarring at the left base laterally.  No significant osseous abnormality.  IMPRESSION: No acute disease.   Original Report Authenticated By: Francene Boyers, M.D.    Ct Orbitss W/o Cm  03/05/2012  *RADIOLOGY REPORT*  Clinical Data: Left orbital swelling, question orbital cellulitis.  CT ORBITS WITHOUT CONTRAST  Technique:  Multidetector CT imaging of the orbits was performed following the  standard protocol without intravenous contrast.  Comparison: None.  Findings: Soft tissue swelling overlying the left orbit with stranding in the subcutaneous soft tissues compatible with preseptal cellulitis.  No postseptal extension.  Globe is intact. No focal fluid collection to suggest abscess.  Mild mucosal thickening in the left frontal sinus and scattered ethmoid air cells.  No air fluid levels.  No acute bony abnormality.  IMPRESSION: Preseptal soft tissue swelling overlying the left orbit compatible with preseptal cellulitis.  No focal fluid collection or post septal extension.   Original Report Authenticated By: Charlett Nose, M.D.     Microbiology: Recent Results (from the past 240 hour(s))  CLOSTRIDIUM DIFFICILE BY PCR     Status: None   Collection Time    03/29/12  8:39 PM      Result Value Range Status   C difficile by pcr NEGATIVE  NEGATIVE Final     Labs: Basic Metabolic Panel:  Recent Labs Lab 03/28/12 2119 03/29/12 0158  NA 139 136  K 3.8 3.5  CL 102 98  CO2  --  28  GLUCOSE 127* 122*  BUN 9 10  CREATININE 0.60 0.67  CALCIUM  --  9.3   Liver Function Tests: No results found for this basename: AST, ALT, ALKPHOS, BILITOT, PROT, ALBUMIN,  in the last 168 hours No results found for this basename: LIPASE, AMYLASE,  in the last 168 hours No results found  for this basename: AMMONIA,  in the last 168 hours CBC:  Recent Labs Lab 03/28/12 2024 03/28/12 2119 03/29/12 0158 03/30/12 0632  WBC 12.0*  --  9.8 9.2  HGB 14.9 16.0 14.0 14.3  HCT 43.6 47.0 41.4 43.1  MCV 90.3  --  90.8 91.9  PLT 279  --  350 332   Cardiac Enzymes:  Recent Labs Lab 03/29/12 0157 03/29/12 0739 03/29/12 1348  TROPONINI <0.30 <0.30 <0.30   BNP: BNP (last 3 results)  Recent Labs  08/09/11 0910  PROBNP 1971.0*   CBG:  Recent Labs Lab 03/29/12 1214 03/29/12 1619 03/29/12 1932 03/29/12 2341 03/30/12 0411  GLUCAP 113* 101* 100* 103* 96       Signed:  Lavelle Akel S  [facility] 03/30/2012, 11:24 AM

## 2012-04-26 ENCOUNTER — Other Ambulatory Visit: Payer: Self-pay | Admitting: Gastroenterology

## 2012-04-27 ENCOUNTER — Encounter (HOSPITAL_COMMUNITY)
Admission: RE | Disposition: A | Discharge status: Home / Self Care | Payer: Self-pay | Source: Ambulatory Visit | Attending: Gastroenterology

## 2012-04-27 ENCOUNTER — Encounter (HOSPITAL_COMMUNITY): Payer: Self-pay | Admitting: *Deleted

## 2012-04-27 ENCOUNTER — Ambulatory Visit (HOSPITAL_COMMUNITY)
Admission: RE | Admit: 2012-04-27 | Discharge: 2012-04-27 | Disposition: A | Discharge status: Home / Self Care | Payer: Medicare Other | Source: Ambulatory Visit | Attending: Gastroenterology | Admitting: Gastroenterology

## 2012-04-27 DIAGNOSIS — K224 Dyskinesia of esophagus: Secondary | ICD-10-CM | POA: Insufficient documentation

## 2012-04-27 DIAGNOSIS — R1310 Dysphagia, unspecified: Secondary | ICD-10-CM | POA: Insufficient documentation

## 2012-04-27 HISTORY — PX: ESOPHAGOGASTRODUODENOSCOPY: SHX5428

## 2012-04-27 HISTORY — PX: BALLOON DILATION: SHX5330

## 2012-04-27 LAB — GLUCOSE, CAPILLARY: Glucose-Capillary: 83 mg/dL (ref 70–99)

## 2012-04-27 SURGERY — EGD (ESOPHAGOGASTRODUODENOSCOPY)
Anesthesia: Moderate Sedation

## 2012-04-27 MED ORDER — FENTANYL CITRATE 0.05 MG/ML IJ SOLN
INTRAMUSCULAR | Status: AC
  Filled 2012-04-27: qty 4

## 2012-04-27 MED ORDER — SODIUM CHLORIDE 0.9 % IV SOLN
INTRAVENOUS | Status: DC
  Administered 2012-04-27: 14:00:00 via INTRAVENOUS
  Administered 2012-04-27: 500 mL via INTRAVENOUS

## 2012-04-27 MED ORDER — SODIUM CHLORIDE 0.9 % IV SOLN: INTRAVENOUS | Status: DC

## 2012-04-27 MED ORDER — DIPHENHYDRAMINE HCL 50 MG/ML IJ SOLN
INTRAMUSCULAR | Status: AC
  Filled 2012-04-27: qty 1

## 2012-04-27 MED ORDER — MIDAZOLAM HCL 10 MG/2ML IJ SOLN
INTRAMUSCULAR | Status: AC
  Filled 2012-04-27: qty 4

## 2012-04-27 MED ORDER — DIPHENHYDRAMINE HCL 50 MG/ML IJ SOLN
INTRAMUSCULAR | Status: DC | PRN
  Administered 2012-04-27: 12.5 mg via INTRAVENOUS

## 2012-04-27 MED ORDER — MIDAZOLAM HCL 10 MG/2ML IJ SOLN
INTRAMUSCULAR | Status: DC | PRN
  Administered 2012-04-27 (×2): 2 mg via INTRAVENOUS
  Administered 2012-04-27: 1 mg via INTRAVENOUS

## 2012-04-27 MED ORDER — FENTANYL CITRATE 0.05 MG/ML IJ SOLN
INTRAMUSCULAR | Status: DC | PRN
  Administered 2012-04-27 (×3): 25 ug via INTRAVENOUS

## 2012-04-27 NOTE — H&P (Signed)
  Date of Initial H&P: 04/08/12.  History reviewed, patient examined, no change in status, stable for surgery. EGD with dilation due to dysphagia.

## 2012-04-27 NOTE — Interval H&P Note (Signed)
History and Physical Interval Note:  04/27/2012 2:30 PM  Mark Russell  has presented today for surgery, with the diagnosis of dysphagia  The various methods of treatment have been discussed with the patient and family. After consideration of risks, benefits and other options for treatment, the patient has consented to  Procedure(s) with comments: ESOPHAGOGASTRODUODENOSCOPY (EGD) (N/A) BALLOON DILATION (N/A) - no xray as a surgical intervention .  The patient's history has been reviewed, patient examined, no change in status, stable for surgery.  I have reviewed the patient's chart and labs.  Questions were answered to the patient's satisfaction.     Aerilyn Slee C.

## 2012-04-27 NOTE — Op Note (Signed)
[facility] [address] [place], [address]   ENDOSCOPY PROCEDURE REPORT  PATIENT: Aqil, Goetting  MR#: [id number] BIRTHDATE: [birth year] , 77  yrs. old GENDER: Male  ENDOSCOPIST: Charlott Rakes, MD REFERRED BY:  PROCEDURE DATE:  04/27/2012 PROCEDURE:   EGD, diagnostic ASA CLASS:   Class III INDICATIONS:Dysphagia. MEDICATIONS: Fentanyl 75 mcg IV, Versed 5 mg IV, and Cetacaine spray x 2  TOPICAL ANESTHETIC:  DESCRIPTION OF PROCEDURE:   After the risks benefits and alternatives of the procedure were thoroughly explained, informed consent was obtained.  The Pentax Gastroscope [id number]  endoscope was introduced through the mouth and advanced to the second portion of the duodenum , limited by Without limitations.   The instrument was slowly withdrawn as the mucosa was fully examined.     FINDINGS: The endoscope was inserted into the oropharynx and esophagus was intubated.  The gastroesophageal junction was noted to be 45 cm from the incisors. The esophagus was normal in appearance. No stricture or ring was noted. The endoscope advanced into the stomach without resistance or difficulty. The endoscope was advanced to the duodenal bulb and second portion of duodenum which were unremarkable.  The endoscope was withdrawn back into the stomach and retroflexion revealed a normal proximal stomach.  COMPLICATIONS: None  ENDOSCOPIC IMPRESSION: 1. Normal EGD - no esophageal stricture noted 2. Suspect dysphagia due to known esophageal dysmotility  RECOMMENDATIONS: Continue PPI BID; Lifestyle modifications for reflux   REPEAT EXAM: N/A  _______________________________ Charlott Rakes, MD eSigned:  Charlott Rakes, MD 04/27/2012 3:01 PM    XB:JYNWGN Valentina Lucks, MD  PATIENT NAME:  Armoni, Depass MR#: [id number]

## 2012-04-27 NOTE — Addendum Note (Signed)
Addended by: Charlott Rakes on: 04/27/2012 01:53 PM   Modules accepted: Orders

## 2012-04-28 ENCOUNTER — Encounter (HOSPITAL_COMMUNITY): Payer: Self-pay | Admitting: Gastroenterology

## 2012-11-10 ENCOUNTER — Encounter: Payer: Self-pay | Admitting: Pulmonary Disease

## 2012-11-10 ENCOUNTER — Telehealth: Payer: Self-pay

## 2012-11-10 ENCOUNTER — Telehealth: Payer: Self-pay | Admitting: Pulmonary Disease

## 2012-11-10 ENCOUNTER — Ambulatory Visit (INDEPENDENT_AMBULATORY_CARE_PROVIDER_SITE_OTHER): Payer: Medicare Other | Admitting: Pulmonary Disease

## 2012-11-10 ENCOUNTER — Ambulatory Visit (HOSPITAL_COMMUNITY)
Admission: RE | Admit: 2012-11-10 | Discharge: 2012-11-10 | Disposition: A | Discharge status: Home / Self Care | Payer: Medicare Other | Source: Ambulatory Visit | Attending: Pulmonary Disease | Admitting: Pulmonary Disease

## 2012-11-10 VITALS — BP 136/74 | HR 68

## 2012-11-10 DIAGNOSIS — J449 Chronic obstructive pulmonary disease, unspecified: Secondary | ICD-10-CM | POA: Insufficient documentation

## 2012-11-10 DIAGNOSIS — R059 Cough, unspecified: Secondary | ICD-10-CM

## 2012-11-10 DIAGNOSIS — R05 Cough: Secondary | ICD-10-CM

## 2012-11-10 DIAGNOSIS — I10 Essential (primary) hypertension: Secondary | ICD-10-CM | POA: Insufficient documentation

## 2012-11-10 DIAGNOSIS — R0602 Shortness of breath: Secondary | ICD-10-CM

## 2012-11-10 DIAGNOSIS — J4489 Other specified chronic obstructive pulmonary disease: Secondary | ICD-10-CM | POA: Insufficient documentation

## 2012-11-10 MED ORDER — BUDESONIDE 0.5 MG/2ML IN SUSP: 0.5000 mg | Freq: Two times a day (BID) | RESPIRATORY_TRACT | Status: DC

## 2012-11-10 MED ORDER — ARFORMOTEROL TARTRATE 15 MCG/2ML IN NEBU: 15.0000 ug | INHALATION_SOLUTION | Freq: Two times a day (BID) | RESPIRATORY_TRACT | Status: DC

## 2012-11-10 NOTE — Addendum Note (Signed)
Addended by: Gwynneth Aliment A on: 11/10/2012 03:13 PM   Modules accepted: Orders

## 2012-11-10 NOTE — Telephone Encounter (Signed)
Pt aware of cxr results.  Mark Russell L

## 2012-11-10 NOTE — Assessment & Plan Note (Addendum)
COPD: GOLD Grade A Combined recommendations from the [employer]	[place], [employer], Designer, television/film set, European Respiratory Society (Qaseem A et al, Ann Intern Med. 2011;155(3):179) recommends tobacco cessation, pulmonary rehab (for symptomatic patients with an FEV1 < 50% predicted), supplemental oxygen (for patients with SaO2 <88% or paO2 <55), and appropriate bronchodilator therapy.  In regards to long acting bronchodilators, they recommend monotherapy (FEV1 60-80% with symptoms weak evidence, FEV1 with symptoms <60% strong evidence), or combination therapy (FEV1 <60% with symptoms, strong recommendation, moderate evidence).  One should also provide patients with annual immunizations and consider therapy for prevention of COPD exacerbations (ie. roflumilast or azithromycin) when appopriate.  -O2 therapy: not indicated -Immunizations: refused despite my recommendation -Tobacco use: quit 1993 -Exercise: difficult given bilateral AKA, recommend stay as active as possible -Bronchodilator therapy: add brovana and pulmicort nebulized bid (prefers this for lower cost) -Exacerbation prevention: brovana/pulmicort

## 2012-11-10 NOTE — Telephone Encounter (Signed)
Pt states he did not realize that he does not have a nebulizer machine at home and would like one ordered. He does not have a current DME. Carron Curie, CMA

## 2012-11-10 NOTE — Telephone Encounter (Signed)
Message copied by Velvet Bathe on Thu Nov 10, 2012  4:15 PM ------      Message from: Max Fickle B      Created: Thu Nov 10, 2012  4:00 PM       A,            Please let him know that his CXR was OK. It showed COPD but nothing else.      Thanks      B ------

## 2012-11-10 NOTE — Telephone Encounter (Signed)
Per Marchelle Folks at [facility], please re-fax Rx's for budesonide & brovana w/ office notes to [phone].  These were faxed to wrong#.  Marchelle Folks can be reached at [phone].  Antionette Fairy

## 2012-11-10 NOTE — Progress Notes (Signed)
Subjective:    Patient ID: Mark Russell, male    DOB: 13-Dec-1935, 77 y.o.   MRN: [id number]  HPI  This is a very pleasant 77 y/o male who previously smoked one pack of cigarettes daily for 54 years until 1993 came to our office today for evaluation of chronic bronchitis.  He was recently evaluated in October 2014 [facility]. At that point he was diagnosed with bronchitis. He had rattling in his chest and sputum production which was green in color. He was treated with doxycycline twice a day for a week and this improved his symptoms. He says that every day he produces sputum and has done so for several months in the last year.he notices a rattling in his chest on a daily basis. He has never been told in the past that he had respiratory problems.  In 2006 he had bilateral above-the-knee amputation and has been relatively immobile since then. The most activity has during a day is when he transfers himself between his chair and the driver seat of his car, his bed, shower, or the toilet. During this time he occasionally does get short of breath.   Past Medical History  Diagnosis Date  . Diabetes mellitus   . Hypertension   . GERD (gastroesophageal reflux disease)   . Anxiety   . Depression   . Peripheral vascular disease   . Neuropathy      Family History  Problem Relation Age of Onset  . Heart attack    . Cancer    . Cancer    . Heart attack       History   Social History  . Marital Status: Married    Spouse Name: N/A    Number of Children: N/A  . Years of Education: N/A   Occupational History  . Not on file.   Social History Main Topics  . Smoking status: Former Smoker -- 0.25 packs/day for 30 years    Types: Cigarettes    Quit date: 08/06/1991  . Smokeless tobacco: Never Used  . Alcohol Use: No  . Drug Use: Not on file  . Sexual Activity: No   Other Topics Concern  . Not on file   Social History Narrative  . No narrative  on file     Allergies  Allergen Reactions  . Dilaudid [Hydromorphone Hcl]     "Mean"  . Morphine And Related Other (See Comments)    "mean"     Outpatient Prescriptions Prior to Visit  Medication Sig Dispense Refill  . acetaminophen (TYLENOL) 500 MG tablet Take 1,000 mg by mouth every 6 (six) hours as needed for pain.      Marland Kitchen FLUoxetine (PROZAC) 20 MG capsule Take 20 mg by mouth daily.      Marland Kitchen glimepiride (AMARYL) 2 MG tablet Take 2 mg by mouth daily before breakfast.      . HYDROcodone-acetaminophen (NORCO/VICODIN) 5-325 MG per tablet Take 1 tablet by mouth every 8 (eight) hours as needed for pain.      . Menthol, Topical Analgesic, (MINERAL ICE) 2 % GEL Apply 1 application topically 3 (three) times daily as needed (for extremity pain).      Marland Kitchen omeprazole (PRILOSEC) 20 MG capsule Take 20 mg by mouth daily.       . simvastatin (ZOCOR) 40 MG tablet Take 40 mg by mouth every evening.      . warfarin (COUMADIN) 4 MG tablet Take 2-4 mg by mouth  as directed. Takes 1/2 tablet on Sunday Tuesday and Thursday and 1 whole tablet all other days      . gabapentin (NEURONTIN) 300 MG capsule Take 600 mg by mouth 2 (two) times daily.       Marland Kitchen loratadine (CLARITIN) 10 MG tablet Take 1 tablet (10 mg total) by mouth daily.  14 tablet  0   No facility-administered medications prior to visit.      Review of Systems  Constitutional: Negative for fever, chills and unexpected weight change.  HENT: Negative for ear pain, postnasal drip, rhinorrhea, sinus pressure, sneezing and sore throat.   Eyes: Negative for pain, redness and itching.  Respiratory: Positive for cough, shortness of breath and wheezing.   Cardiovascular: Negative for chest pain.  Gastrointestinal: Negative for nausea, vomiting, diarrhea and constipation.  Endocrine: Negative for cold intolerance and heat intolerance.  Genitourinary: Negative for frequency and difficulty urinating.  Musculoskeletal: Positive for arthralgias, back pain and  joint swelling.  Skin: Negative for color change.  Allergic/Immunologic: Negative for environmental allergies, food allergies and immunocompromised state.  Neurological: Positive for numbness. Negative for dizziness and headaches.  Hematological: Bruises/bleeds easily.  Psychiatric/Behavioral: Negative for hallucinations, confusion, sleep disturbance and dysphoric mood. The patient is not nervous/anxious and is not hyperactive.        Objective:   Physical Exam Filed Vitals:   11/10/12 1413  BP: 136/74  Pulse: 68  SpO2: 97%  RA  Gen: chronically ill appearing, wheelchair bound HEENT: NCAT, PERRL, EOMi, OP clear, neck supple without masses PULM: wheezing and rhonchi bilaterally CV: RRR, no mgr, no JVD AB: BS+, soft, nontender, no hsm Ext: s/p bilateral AKA Derm: no rash or skin breakdown Neuro: A&Ox4, CN II-XII intact, 5/5 in upper extremities bilaterally   Spirometry today > clear airflow obstruction     Assessment & Plan:   COPD (chronic obstructive pulmonary disease) COPD: GOLD Grade A Combined recommendations from the [employer] of Phys[employer], [employer] of Chest Physicians, Designer, television/film set, European Respiratory Society (Qaseem A et al, Ann Intern Med. 2011;155(3):179) recommends tobacco cessation, pulmonary rehab (for symptomatic patients with an FEV1 < 50% predicted), supplemental oxygen (for patients with SaO2 <88% or paO2 <55), and appropriate bronchodilator therapy.  In regards to long acting bronchodilators, they recommend monotherapy (FEV1 60-80% with symptoms weak evidence, FEV1 with symptoms <60% strong evidence), or combination therapy (FEV1 <60% with symptoms, strong recommendation, moderate evidence).  One should also provide patients with annual immunizations and consider therapy for prevention of COPD exacerbations (ie. roflumilast or azithromycin) when appopriate.  -O2 therapy: not indicated -Immunizations: refused despite my  recommendation -Tobacco use: quit 1993 -Exercise: difficult given bilateral AKA, recommend stay as active as possible -Bronchodilator therapy: add brovana and pulmicort nebulized bid (prefers this for lower cost) -Exacerbation prevention: brovana/pulmicort     Updated Medication List Outpatient Encounter Prescriptions as of 11/10/2012  Medication Sig  . acetaminophen (TYLENOL) 500 MG tablet Take 1,000 mg by mouth every 6 (six) hours as needed for pain.  Marland Kitchen FLUoxetine (PROZAC) 20 MG capsule Take 20 mg by mouth daily.  Marland Kitchen glimepiride (AMARYL) 2 MG tablet Take 2 mg by mouth daily before breakfast.  . HYDROcodone-acetaminophen (NORCO/VICODIN) 5-325 MG per tablet Take 1 tablet by mouth every 8 (eight) hours as needed for pain.  . Menthol, Topical Analgesic, (MINERAL ICE) 2 % GEL Apply 1 application topically 3 (three) times daily as needed (for extremity pain).  Marland Kitchen omeprazole (PRILOSEC) 20 MG capsule Take 20 mg by mouth  daily.   . simvastatin (ZOCOR) 40 MG tablet Take 40 mg by mouth every evening.  . warfarin (COUMADIN) 4 MG tablet Take 2-4 mg by mouth as directed. Takes 1/2 tablet on Sunday Tuesday and Thursday and 1 whole tablet all other days  . arformoterol (BROVANA) 15 MCG/2ML NEBU Take 2 mLs (15 mcg total) by nebulization 2 (two) times daily.  . budesonide (PULMICORT) 0.5 MG/2ML nebulizer solution Take 2 mLs (0.5 mg total) by nebulization 2 (two) times daily.  Marland Kitchen gabapentin (NEURONTIN) 300 MG capsule Take 600 mg by mouth 2 (two) times daily.   Marland Kitchen loratadine (CLARITIN) 10 MG tablet Take 1 tablet (10 mg total) by mouth daily.  . [DISCONTINUED] arformoterol (BROVANA) 15 MCG/2ML NEBU Take 2 mLs (15 mcg total) by nebulization 2 (two) times daily.  . [DISCONTINUED] budesonide (PULMICORT) 0.5 MG/2ML nebulizer solution Take 2 mLs (0.5 mg total) by nebulization 2 (two) times daily.  . [DISCONTINUED] budesonide (PULMICORT) 0.5 MG/2ML nebulizer solution Take 2 mLs (0.5 mg total) by nebulization 2 (two)  times daily.

## 2012-11-10 NOTE — Patient Instructions (Addendum)
You have COPD Use the brovana and pulmicort twice a day no matter how you feel We will send you for a Chest X-ray today and call you with the results  We will see you back in 4-6 months or sooner if needed

## 2012-11-11 ENCOUNTER — Telehealth: Payer: Self-pay | Admitting: Pulmonary Disease

## 2012-11-11 NOTE — Telephone Encounter (Signed)
I spoke with Marchelle Folks and she received the RX's for pt neb meds last night. She did not receive any office notes. She reports we need to fax the RX's and office notes to the fax # provided. She reports she can't forward what she has bc they are not allowed. Per Almyra Free advised Marchelle Folks to fax [place] back the rx's we sent to her to her fax at  [phone]. I spoke with [facility] and she is going to do so. Will await fax

## 2012-11-11 NOTE — Telephone Encounter (Signed)
Referral, Rx faxed to [facility] [phone]. Confirmation received. Nothing else needed at this time. Rhonda J Cobb

## 2012-11-11 NOTE — Telephone Encounter (Signed)
Fax received and giving to rhonda. Referral placed as well. Will forward to rhonda to document once done.

## 2012-11-11 NOTE — Telephone Encounter (Signed)
See phone message 11/10/12

## 2012-11-15 ENCOUNTER — Telehealth: Payer: Self-pay | Admitting: Pulmonary Disease

## 2012-11-15 DIAGNOSIS — J449 Chronic obstructive pulmonary disease, unspecified: Secondary | ICD-10-CM

## 2012-11-15 NOTE — Telephone Encounter (Signed)
Order placed. Doretta Remmert, CMA  

## 2012-11-15 NOTE — Telephone Encounter (Signed)
Mandie from [facility] requested the last office note for Dr. Kendrick Fries.  I faxed 7 pages the 11/10/2012 office note on 11/11/2012

## 2012-11-17 ENCOUNTER — Encounter: Payer: Self-pay | Admitting: Pulmonary Disease

## 2012-11-17 ENCOUNTER — Telehealth: Payer: Self-pay | Admitting: Pulmonary Disease

## 2012-11-17 DIAGNOSIS — G4733 Obstructive sleep apnea (adult) (pediatric): Secondary | ICD-10-CM | POA: Insufficient documentation

## 2012-11-17 NOTE — Telephone Encounter (Signed)
I called and spoke with pt. He reports [employer] brought his supplies and meds. He was told to call once this was received to be shown how to use it. I advised him this will need to go through [facility]. They will call them. Nothing further needed

## 2012-11-21 ENCOUNTER — Telehealth: Payer: Self-pay | Admitting: Pulmonary Disease

## 2012-11-21 NOTE — Telephone Encounter (Signed)
Tremors yes with borvana but not leg jerking. So restart pulmicort and brovana. IF happens again, call back. THat will increase the odds is med related. Will forward to Dr Kendrick Fries as fyi and for opinion  Dr. Kalman Shan, M.D., [facility].C.P Pulmonary and Critical Care Medicine Staff Physician [facility] Pager: [phone], If no answer or between  15:00h - 7:00h: call [phone]  11/21/2012 11:34 AM

## 2012-11-21 NOTE — Telephone Encounter (Signed)
I spoke with pt spouse. She reports Pt started his budesonide and brovana neb on Friday morning. Pt noticed Saturday morning after his breathing tx his right leg was jerking. Per pt this did not start happening until he started this medication. Pt did not use it yesterday or today and his leg was not jerking. They are wanting to know if it could be these medications since he has not started anything else new. Please advise MR thanks

## 2012-11-21 NOTE — Telephone Encounter (Signed)
I called and made pt aware of recs. Nothing further needed 

## 2013-01-06 ENCOUNTER — Encounter (INDEPENDENT_AMBULATORY_CARE_PROVIDER_SITE_OTHER): Payer: Self-pay | Admitting: General Surgery

## 2013-01-16 ENCOUNTER — Encounter (INDEPENDENT_AMBULATORY_CARE_PROVIDER_SITE_OTHER): Payer: Self-pay

## 2013-01-18 ENCOUNTER — Ambulatory Visit (INDEPENDENT_AMBULATORY_CARE_PROVIDER_SITE_OTHER): Payer: Self-pay | Admitting: General Surgery

## 2013-02-17 ENCOUNTER — Encounter: Payer: Self-pay | Admitting: Neurology

## 2013-02-17 ENCOUNTER — Ambulatory Visit (INDEPENDENT_AMBULATORY_CARE_PROVIDER_SITE_OTHER): Payer: Medicare Other | Admitting: Neurology

## 2013-02-17 VITALS — BP 140/76 | HR 64 | Temp 98.0°F | Resp 16

## 2013-02-17 DIAGNOSIS — G546 Phantom limb syndrome with pain: Secondary | ICD-10-CM

## 2013-02-17 DIAGNOSIS — G547 Phantom limb syndrome without pain: Secondary | ICD-10-CM

## 2013-02-17 MED ORDER — GABAPENTIN 800 MG PO TABS
800.0000 mg | ORAL_TABLET | Freq: Three times a day (TID) | ORAL | Status: DC
Start: 2013-02-17 — End: 2015-08-09

## 2013-02-17 NOTE — Progress Notes (Signed)
Note faxed.

## 2013-02-17 NOTE — Patient Instructions (Signed)
Increase neurontin 800mg  three times daily Return to clinic in 393-months

## 2013-02-17 NOTE — Progress Notes (Signed)
[facility] Neurology Division Clinic Note - Initial Visit   Date: 02/17/2013    Mark Russell MRN: [id number] DOB: [birth year]   Dear Dr Valentina LucksGriffin:  Thank you for your kind referral of Mark Russell for consultation of phantom limb pain. Although his history is well known to you, please allow us to reiterate it for the purpose of our medical record. The patient was accompanied to the clinic by self.   History of Present Illness: Mark Russell is a 78 y.o. right-handed Caucasian male with history of diabetes mellitus, hypertension, GERD, depression, BPH, DVT (on coumadin), severe PVD s/p bilateral above knee amputations complcated by phantom pain, and BPH presenting for evaluation for worsening phantom limb pain.  He underwent bilateral leg amputation in 2006.  Around 2011, he started having burning pain of his bilateral toes.  Pain is worse at night and alleviated by neurontin 600mg  TID and hydrocodone.  Pain is improved by ~80% when he takes the medication.  Previously tried medication include Gralise.  He has previously seen pain management who was prescribing hydrocodone.   Of note, he was in the garden on 02/15/2012 and his motorized chair went back and flipped him out of the chair.  He hit his head, but did not lose consciousness.  EMS came and lifted him back to his chair.   His daughter unfortunately is battling metastatic esophageal cancer.  Out-side paper records, electronic medical record, and images have been reviewed where available and summarized as:  Labs 01/10/2013:  HbA1c 6.6 Cr 0.76, GFR >100,   Past Medical History  Diagnosis Date  . Diabetes mellitus   . Hypertension   . GERD (gastroesophageal reflux disease)   . Anxiety   . Depression   . Peripheral vascular disease   . Neuropathy   . Sleep apnea   . Avascular necrosis   . BPH (benign prostatic hyperplasia)   . DVT (deep venous thrombosis)   . History of leg amputation     both legs    Past  Surgical History  Procedure Laterality Date  . Leg amputation through knee  06/2004  . Abdominal aortic aneurysm repair  1988  . Back surgery  1988, 1989  . Esophagogastroduodenoscopy N/A 04/27/2012    Procedure: ESOPHAGOGASTRODUODENOSCOPY (EGD);  Surgeon: Shirley FriarVincent C. Schooler, MD;  [facility];  Service: Endoscopy;  Laterality: N/A;  . Balloon dilation N/A 04/27/2012    Procedure: BALLOON DILATION;  Surgeon: Shirley FriarVincent C. Schooler, MD;  Location: [facility];  Service: Endoscopy;  Laterality: N/A;  no xray  . Bypass graft       Medications:  Current Outpatient Prescriptions on File Prior to Visit  Medication Sig Dispense Refill  . acetaminophen (TYLENOL) 500 MG tablet Take 1,000 mg by mouth every 6 (six) hours as needed for pain.      Marland Kitchen. albuterol (PROVENTIL HFA;VENTOLIN HFA) 108 (90 BASE) MCG/ACT inhaler Inhale into the lungs every 6 (six) hours as needed for wheezing or shortness of breath.      Marland Kitchen. arformoterol (BROVANA) 15 MCG/2ML NEBU Take 2 mLs (15 mcg total) by nebulization 2 (two) times daily.  120 mL  6  . budesonide (PULMICORT) 0.5 MG/2ML nebulizer solution Take 2 mLs (0.5 mg total) by nebulization 2 (two) times daily.  120 mL  12  . FLUoxetine (PROZAC) 20 MG capsule Take 20 mg by mouth daily.      Marland Kitchen. gabapentin (NEURONTIN) 300 MG capsule Take 600 mg by mouth 2 (two) times daily.       .Marland Kitchen  glimepiride (AMARYL) 2 MG tablet Take 2 mg by mouth daily before breakfast.      . HYDROcodone-acetaminophen (NORCO/VICODIN) 5-325 MG per tablet Take 1 tablet by mouth every 8 (eight) hours as needed for pain.      Marland Kitchen loratadine (CLARITIN) 10 MG tablet Take 1 tablet (10 mg total) by mouth daily.  14 tablet  0  . Menthol, Topical Analgesic, (MINERAL ICE) 2 % GEL Apply 1 application topically 3 (three) times daily as needed (for extremity pain).      Marland Kitchen omeprazole (PRILOSEC) 20 MG capsule Take 20 mg by mouth daily.       . simvastatin (ZOCOR) 40 MG tablet Take 40 mg by mouth every evening.      .  traZODone (DESYREL) 100 MG tablet Take 100 mg by mouth at bedtime.      Marland Kitchen warfarin (COUMADIN) 4 MG tablet Take 2-4 mg by mouth as directed. Takes 1/2 tablet on Sunday Tuesday and Thursday and 1 whole tablet all other days       No current facility-administered medications on file prior to visit.    Allergies:  Allergies  Allergen Reactions  . Ace Inhibitors     hypotension  . Dilaudid [Hydromorphone Hcl]     "Mean"  . Metformin And Related     GI upset  . Morphine And Related Other (See Comments)    "mean"  . Pletal [Cilostazol]     SOB    Family History: Family History  Problem Relation Age of Onset  . Heart attack    . Cancer    . Cancer    . Heart attack    . Esophageal cancer Daughter     Living, 45    Social History: History   Social History  . Marital Status: Married    Spouse Name: N/A    Number of Children: 4  . Years of Education: N/A   Occupational History  . disabled    Social History Main Topics  . Smoking status: Former Smoker -- 0.25 packs/day for 30 years    Types: Cigarettes    Quit date: 08/06/1991  . Smokeless tobacco: Never Used  . Alcohol Use: No  . Drug Use: Not on file  . Sexual Activity: No   Other Topics Concern  . Not on file   Social History Narrative   Lives with wife of 57 years.  They have four grown children.          Review of Systems:  CONSTITUTIONAL: No fevers, chills, night sweats, or weight loss.   EYES: No visual changes or eye pain ENT: No hearing changes.  No history of nose bleeds.   RESPIRATORY: No cough, wheezing and shortness of breath.   CARDIOVASCULAR: Negative for chest pain, and palpitations.   GI: Negative for abdominal discomfort, blood in stools or black stools.  No recent change in bowel habits.   GU:  No history of incontinence.   MUSCLOSKELETAL: No history of joint pain or swelling.  No myalgias.   SKIN: Negative for lesions, rash, and itching.   HEMATOLOGY/ONCOLOGY: Negative for prolonged  bleeding, bruising easily, and swollen nodes.  No history of cancer.   ENDOCRINE: Negative for cold or heat intolerance, polydipsia or goiter.   PSYCH:  No depression or anxiety symptoms.   NEURO: As Above.   Vital Signs:  BP 140/76  Pulse 64  Temp(Src) 98 F (36.7 C)  Resp 16 General:  Posterior scalp abrasion, healing well  Neurological  Exam: MENTAL STATUS including orientation to time, place, person, recent and remote memory, attention span and concentration, language, and fund of knowledge is normal.  Speech is not dysarthric.  CRANIAL NERVES: II:  No visual field defects.  Unremarkable fundi.   III-IV-VI: Pupils equal round and reactive to light.  Normal conjugate, extra-ocular eye movements in all directions of gaze.  No nystagmus.  No ptosis.   V:  Normal facial sensation.    VII:  Normal facial symmetry and movements.    VIII:  Normal hearing and vestibular function.   IX-X:  Normal palatal movement.   XI:  Normal shoulder shrug and head rotation.   XII:  Normal tongue strength and range of motion, no deviation or fasciculation.  MOTOR:  No atrophy, fasciculations or abnormal movements.  No pronator drift.  Tone is normal.    Right Upper Extremity:    Left Upper Extremity:    Deltoid  5/5   Deltoid  5/5   Biceps  5/5   Biceps  5/5   Triceps  5/5   Triceps  5/5   Wrist extensors  5/5   Wrist extensors  5/5   Wrist flexors  5/5   Wrist flexors  5/5   Finger extensors  5/5   Finger extensors  5/5   Finger flexors  5/5   Finger flexors  5/5   Dorsal interossei  5/5   Dorsal interossei  5/5   Abductor pollicis  5/5   Abductor pollicis  5/5   Tone (Ashworth scale)  0  Tone (Ashworth scale)  0   MSRs:  Right                                                                 Left brachioradialis trace  brachioradialis trace  biceps trace  biceps trace  triceps 1+  triceps 1+   SENSORY:  Normal and symmetric perception of light touch, pinprick, and vibration  throughout.  COORDINATION/GAIT: Normal finger-to- nose-finger.  Intact rapid alternating movements bilaterally.  Gait not tested as patient is wheelchair bound   IMPRESSION/PLAN: Mr. Mauritz is a 78 year old gentleman presenting with phantom limb syndrome of the legs.  He has severe peripheral vascular disease and underwent above-the-knee amputation of both legs in 2006. Several years following this, he developed burning pains of his toes. He is currently taking Neurontin 600 mg 3 times daily and hydrocodone as needed, which has significant improvement in pain of 80%. He continues to have some degree of constant pain throughout the day and for this, I think gabapentin can be optimized further.  I will recommend increasing Neurontin to 800 mg 3 times daily. Risks and benefits were discussed.  I will see him back in 3 months or sooner as needed.  The duration of this appointment visit was 45 minutes of face-to-face time with the patient.  Greater than 50% of this time was spent in counseling, explanation of diagnosis, planning of further management, and coordination of care.   Thank you for allowing me to participate in patient's care.  If I can answer any additional questions, I would be pleased to do so.    Sincerely,    Kamaree Berkel K. Allena Katz, DO

## 2013-03-06 ENCOUNTER — Ambulatory Visit (INDEPENDENT_AMBULATORY_CARE_PROVIDER_SITE_OTHER): Payer: Medicare Other | Admitting: General Surgery

## 2013-05-16 ENCOUNTER — Ambulatory Visit: Payer: Medicare Other | Admitting: Neurology

## 2013-09-04 ENCOUNTER — Encounter: Payer: Self-pay | Admitting: Pulmonary Disease

## 2013-09-04 ENCOUNTER — Ambulatory Visit (INDEPENDENT_AMBULATORY_CARE_PROVIDER_SITE_OTHER): Payer: Medicare Other | Admitting: Pulmonary Disease

## 2013-09-04 VITALS — BP 130/70 | HR 71

## 2013-09-04 DIAGNOSIS — J411 Mucopurulent chronic bronchitis: Secondary | ICD-10-CM

## 2013-09-04 MED ORDER — ARFORMOTEROL TARTRATE 15 MCG/2ML IN NEBU: 15.0000 ug | INHALATION_SOLUTION | Freq: Two times a day (BID) | RESPIRATORY_TRACT | Status: DC | PRN

## 2013-09-04 MED ORDER — BUDESONIDE 0.5 MG/2ML IN SUSP: 0.5000 mg | Freq: Two times a day (BID) | RESPIRATORY_TRACT | Status: DC

## 2013-09-04 NOTE — Assessment & Plan Note (Signed)
This has been a stable interval for Mark Russell.  He does well with taking pulmicort and brovana once per day. I tried to verify that he is taking pulmicort and brovana which he claims, but he did not bring his medicines today.  Plan: -continue pulmicort and brovana daily -he refuses flu shot -f/u 6 months

## 2013-09-04 NOTE — Progress Notes (Signed)
Subjective:    Patient ID: Mark Russell, male    DOB: 1935/01/16, 78 y.o.   MRN: [id number]  Synopsis: COPD, difficult to grade given bilateral AKA status  HPI  09/04/2013 ROV> Mark Russell has the pulmicort and brovana but is only using them on days when he is feeling problems. It seems to help when he takes the medcine, but he only takes them once per day.  He said it helped with the shortness of breath, but he didn't like taking them twice a day.  He thinks that is too much.  He continues to "hack away" but the pulmicort and brovana seems to help.  Medicines are covered by medicare from [employer].    Past Medical History  Diagnosis Date  . Diabetes mellitus   . Hypertension   . GERD (gastroesophageal reflux disease)   . Anxiety   . Depression   . Peripheral vascular disease   . Neuropathy   . Sleep apnea   . Avascular necrosis   . BPH (benign prostatic hyperplasia)   . DVT (deep venous thrombosis)   . History of leg amputation     both legs     Review of Systems     Objective:   Physical Exam Filed Vitals:   09/04/13 0958  BP: 130/70  Pulse: 71  SpO2: 95%   Gen: obese, chronically ill appearing in wheelchair HEENT: NCAT OP clear no thrush PULM: wheezing bilaterally, good air movement CV: RRR, no mgr AB: BS+, soft, nontender Ext: bilateral AKA Neuro: A&Ox4,        Assessment & Plan:   COPD (chronic obstructive pulmonary disease) This has been a stable interval for Mark Russell.  He does well with taking pulmicort and brovana once per day. I tried to verify that he is taking pulmicort and brovana which he claims, but he did not bring his medicines today.  Plan: -continue pulmicort and brovana daily -he refuses flu shot -f/u 6 months    Updated Medication List Outpatient Encounter Prescriptions as of 09/04/2013  Medication Sig  . albuterol (PROVENTIL HFA;VENTOLIN HFA) 108 (90 BASE) MCG/ACT inhaler Inhale into the lungs every 6 (six) hours as needed for  wheezing or shortness of breath.  Marland Kitchen arformoterol (BROVANA) 15 MCG/2ML NEBU Take 15 mcg by nebulization 2 (two) times daily as needed.  Marland Kitchen FLUoxetine (PROZAC) 20 MG capsule Take 20 mg by mouth daily.  Marland Kitchen gabapentin (NEURONTIN) 800 MG tablet Take 1 tablet (800 mg total) by mouth 3 (three) times daily.  Marland Kitchen glimepiride (AMARYL) 2 MG tablet Take 2 mg by mouth daily before breakfast.  . HYDROcodone-acetaminophen (NORCO/VICODIN) 5-325 MG per tablet Take 1 tablet by mouth every 8 (eight) hours as needed for pain.  Marland Kitchen loratadine (CLARITIN) 10 MG tablet Take 1 tablet (10 mg total) by mouth daily.  . Menthol, Topical Analgesic, (MINERAL ICE) 2 % GEL Apply 1 application topically 3 (three) times daily as needed (for extremity pain).  Marland Kitchen omeprazole (PRILOSEC) 20 MG capsule Take 20 mg by mouth daily.   . simvastatin (ZOCOR) 40 MG tablet Take 40 mg by mouth every evening.  . traZODone (DESYREL) 100 MG tablet Take 100 mg by mouth at bedtime.  Marland Kitchen warfarin (COUMADIN) 4 MG tablet Take 2-4 mg by mouth as directed. Takes 1/2 tablet on Sunday Tuesday and Thursday and 1 whole tablet all other days  . [DISCONTINUED] arformoterol (BROVANA) 15 MCG/2ML NEBU Take 2 mLs (15 mcg total) by nebulization 2 (two) times daily.  . [DISCONTINUED]  acetaminophen (TYLENOL) 500 MG tablet Take 1,000 mg by mouth every 6 (six) hours as needed for pain.  . [DISCONTINUED] budesonide (PULMICORT) 0.5 MG/2ML nebulizer solution Take 2 mLs (0.5 mg total) by nebulization 2 (two) times daily.

## 2013-09-04 NOTE — Patient Instructions (Signed)
Keep taking your medicines as you are doing We will see you back in 6 months or sooner if needed 

## 2013-12-04 ENCOUNTER — Telehealth: Payer: Self-pay | Admitting: Pulmonary Disease

## 2013-12-04 MED ORDER — ARFORMOTEROL TARTRATE 15 MCG/2ML IN NEBU: 15.0000 ug | INHALATION_SOLUTION | Freq: Two times a day (BID) | RESPIRATORY_TRACT | Status: DC | PRN

## 2013-12-04 MED ORDER — BUDESONIDE 0.5 MG/2ML IN SUSP: 0.5000 mg | Freq: Two times a day (BID) | RESPIRATORY_TRACT | Status: DC

## 2013-12-04 NOTE — Telephone Encounter (Signed)
rx printed out and will be faxed to [employer] per pts request.  Called and spoke with pt and he is aware and nothing further is needed.

## 2013-12-05 ENCOUNTER — Telehealth: Payer: Self-pay | Admitting: Pulmonary Disease

## 2013-12-05 NOTE — Telephone Encounter (Signed)
Needing chart notes stating the "need for medication" Medications are Brovana and Pulmicort (F) [phone] ATTN: Robynn Pane These have been faxed. Nothing further needed.

## 2013-12-05 NOTE — Telephone Encounter (Signed)
Spoke with Mark Russell, need to take off "prn" off brovana rx for insurance to cover.  I verbalized that this was ok.  Nothing further needed at this time.

## 2014-01-02 ENCOUNTER — Encounter (HOSPITAL_BASED_OUTPATIENT_CLINIC_OR_DEPARTMENT_OTHER): Payer: Medicare Other

## 2014-01-09 ENCOUNTER — Telehealth: Payer: Self-pay

## 2014-01-09 DIAGNOSIS — Z95828 Presence of other vascular implants and grafts: Secondary | ICD-10-CM

## 2014-01-09 DIAGNOSIS — I739 Peripheral vascular disease, unspecified: Secondary | ICD-10-CM

## 2014-01-09 DIAGNOSIS — R208 Other disturbances of skin sensation: Secondary | ICD-10-CM

## 2014-01-09 NOTE — Telephone Encounter (Signed)
Spoke with patient to confirm, dpm  °

## 2014-01-09 NOTE — Telephone Encounter (Signed)
Phone call from pt.  Stated his bilateral stump sites are cold at the ends.  Denies any change in color.  Denies any open sores, redness or inflammation.  Reported he has had "increased phantom pain over the past several weeks."   Reported he saw his PCP yesterday, and was advised to get back in to see Dr. Hart Rochester to check his bypass graft. Discussed with Dr. Hart Rochester.  Recommended to schedule duplex of Aortic BPG and office appt. next Tuesday.  Will contact pt. With appt.

## 2014-01-12 ENCOUNTER — Other Ambulatory Visit: Payer: Self-pay | Admitting: *Deleted

## 2014-01-12 ENCOUNTER — Encounter: Payer: Self-pay | Admitting: Vascular Surgery

## 2014-01-15 ENCOUNTER — Encounter: Payer: Self-pay | Admitting: Vascular Surgery

## 2014-01-16 ENCOUNTER — Ambulatory Visit (INDEPENDENT_AMBULATORY_CARE_PROVIDER_SITE_OTHER): Payer: Medicare Other | Admitting: Vascular Surgery

## 2014-01-16 ENCOUNTER — Ambulatory Visit (HOSPITAL_COMMUNITY)
Admission: RE | Admit: 2014-01-16 | Discharge: 2014-01-16 | Disposition: A | Discharge status: Home / Self Care | Payer: Medicare Other | Source: Ambulatory Visit | Attending: Vascular Surgery | Admitting: Vascular Surgery

## 2014-01-16 VITALS — BP 122/57 | HR 63 | Resp 16 | Ht <= 58 in | Wt 260.0 lb

## 2014-01-16 DIAGNOSIS — Z95828 Presence of other vascular implants and grafts: Secondary | ICD-10-CM

## 2014-01-16 DIAGNOSIS — Z9889 Other specified postprocedural states: Secondary | ICD-10-CM | POA: Diagnosis not present

## 2014-01-16 DIAGNOSIS — I739 Peripheral vascular disease, unspecified: Secondary | ICD-10-CM | POA: Insufficient documentation

## 2014-01-16 DIAGNOSIS — R208 Other disturbances of skin sensation: Secondary | ICD-10-CM

## 2014-01-16 NOTE — Progress Notes (Signed)
Subjective:     Patient ID: Mark Russell, male   DOB: 1935-03-05, 79 y.o.   MRN: [id number]  HPI this 79 year old male is evaluated for cold sensation in bilateral AKA stumps. Patient had remote aortobifemoral bypass graft by me in the early 1990s records are not available today. He did well for several years and ultimately had further surgery performed at [facility]. It sounds as though he had a right axillobifemoral bypass at some point in time around 2006. He then must have developed infection of that graft which was removed and he had bilateral AKA's per Dr.Geary. He has had phantom pain since that surgery but now states his phantom pain has become worse in both AKA stumps have been becoming increasingly cool to sensation. He does have some discomfort in the stumps on occasion. He has no history of infection in the stumps or gangrene. He is confined to a wheelchair. He is currently going to the pain management clinic.  Past Medical History  Diagnosis Date  . Diabetes mellitus   . Hypertension   . GERD (gastroesophageal reflux disease)   . Anxiety   . Depression   . Peripheral vascular disease   . Neuropathy   . Sleep apnea   . Avascular necrosis   . BPH (benign prostatic hyperplasia)   . DVT (deep venous thrombosis)   . History of leg amputation     both legs    History  Substance Use Topics  . Smoking status: Former Smoker -- 0.25 packs/day for 30 years    Types: Cigarettes    Quit date: 08/06/1991  . Smokeless tobacco: Never Used  . Alcohol Use: No    Family History  Problem Relation Age of Onset  . Heart attack    . Cancer    . Cancer    . Heart attack    . Esophageal cancer Daughter     Living, 17    Allergies  Allergen Reactions  . Ace Inhibitors     hypotension  . Dilaudid [Hydromorphone Hcl]     "Mean"  . Metformin And Related     GI upset  . Morphine And Related Other (See Comments)    "mean"  . Pletal [Cilostazol]     SOB    Current outpatient  prescriptions: albuterol (PROVENTIL HFA;VENTOLIN HFA) 108 (90 BASE) MCG/ACT inhaler, Inhale into the lungs every 6 (six) hours as needed for wheezing or shortness of breath., Disp: , Rfl: ;  arformoterol (BROVANA) 15 MCG/2ML NEBU, Take 2 mLs (15 mcg total) by nebulization 2 (two) times daily as needed., Disp: 120 mL, Rfl: 12 budesonide (PULMICORT) 0.5 MG/2ML nebulizer solution, Take 2 mLs (0.5 mg total) by nebulization 2 (two) times daily., Disp: 120 mL, Rfl: 12;  FLUoxetine (PROZAC) 20 MG capsule, Take 20 mg by mouth daily., Disp: , Rfl: ;  gabapentin (NEURONTIN) 800 MG tablet, Take 1 tablet (800 mg total) by mouth 3 (three) times daily., Disp: 90 tablet, Rfl: 6;  glimepiride (AMARYL) 2 MG tablet, Take 2 mg by mouth daily before breakfast., Disp: , Rfl:  HYDROcodone-acetaminophen (NORCO) 7.5-325 MG per tablet, Take 1 tablet by mouth every 6 (six) hours as needed for moderate pain., Disp: , Rfl: ;  loratadine (CLARITIN) 10 MG tablet, Take 1 tablet (10 mg total) by mouth daily., Disp: 14 tablet, Rfl: 0;  Menthol, Topical Analgesic, (MINERAL ICE) 2 % GEL, Apply 1 application topically 3 (three) times daily as needed (for extremity pain)., Disp: , Rfl:  omeprazole (PRILOSEC)  20 MG capsule, Take 20 mg by mouth daily. , Disp: , Rfl: ;  simvastatin (ZOCOR) 40 MG tablet, Take 40 mg by mouth every evening., Disp: , Rfl: ;  traZODone (DESYREL) 100 MG tablet, Take 100 mg by mouth at bedtime., Disp: , Rfl: ;  warfarin (COUMADIN) 4 MG tablet, Take 2-4 mg by mouth as directed. Takes 1/2 tablet on Sunday Tuesday and Thursday and 1 whole tablet all other days, Disp: , Rfl:  HYDROcodone-acetaminophen (NORCO/VICODIN) 5-325 MG per tablet, Take 1 tablet by mouth every 8 (eight) hours as needed for pain., Disp: , Rfl:   BP 122/57 mmHg  Pulse 63  Resp 16  Ht 4' (1.219 m)  Wt 260 lb (117.935 kg)  BMI 79.37 kg/m2  Body mass index is 79.37 kg/(m^2).           Review of Systems denies chest pain, dyspnea on exertion,  PND, orthopnea, hemoptysis. Does have GERD. Has morbid obesity. Nonambulatory. Has type 2 diabetes mellitus with neuropathy. Also has sleep apnea. Other systems negative and complete review of systems     Objective:   Physical Exam BP 122/57 mmHg  Pulse 63  Resp 16  Ht 4' (1.219 m)  Wt 260 lb (117.935 kg)  BMI 79.37 kg/m2  Gen.-alert and oriented x3 in no apparent distress-in wheelchair  HEENT normal for age Lungs no rhonchi or wheezing Cardiovascular regular rhythm no murmurs carotid pulses 3+ palpable no bruits audible Abdomen soft nontender no palpable masses-obese-large ventral hernia to the right side of umbilicus. Musculoskeletal --bilateral AKA's-cool to palpation but no gangrene or infection or drainage  Skin clear -no rashes Neurologic normal Lower extremities-absent femoral pulses to palpation with very large panniculus overhanging inguinal areas. No infection noted in AKA stumps. No Doppler flow by handheld Doppler.  Today I ordered a duplex scan of the abdominal and inguinal areas. This was very difficult because of patient's morbid obesity. He has no patent vessels in the inguinal areas in his graft appears occluded with an audible flow.      Assessment:     Patient with history of aortobifemoral bypass in early 1990s ultimately with occlusion of the bypass and insertion right axillobifemoral graft at [facility] in 2006. Subsequently this became infected was removed and patient had bilateral AKA's in 2006 and now has phantom pain and cool stumps    Plan:     No options remain for this patient who has total occlusion of his femoral vessels with occlusion of aortobifemoral graft chronically and attempted axillobifemoral graft became infected followed by bilateral AKA's. Would recommend treatment in pain clinic as no operative options exist.

## 2014-07-09 ENCOUNTER — Emergency Department (INDEPENDENT_AMBULATORY_CARE_PROVIDER_SITE_OTHER): Payer: Medicare Other

## 2014-07-09 ENCOUNTER — Emergency Department (HOSPITAL_COMMUNITY)
Admission: EM | Admit: 2014-07-09 | Discharge: 2014-07-09 | Disposition: A | Discharge status: Home / Self Care | Payer: Medicare Other | Source: Home / Self Care

## 2014-07-09 ENCOUNTER — Encounter (HOSPITAL_COMMUNITY): Payer: Self-pay | Admitting: Emergency Medicine

## 2014-07-09 DIAGNOSIS — M25511 Pain in right shoulder: Secondary | ICD-10-CM

## 2014-07-09 DIAGNOSIS — M25512 Pain in left shoulder: Secondary | ICD-10-CM

## 2014-07-09 DIAGNOSIS — M75102 Unspecified rotator cuff tear or rupture of left shoulder, not specified as traumatic: Secondary | ICD-10-CM

## 2014-07-09 MED ORDER — METHYLPREDNISOLONE ACETATE 40 MG/ML IJ SUSP: 40.0000 mg | Freq: Once | INTRAMUSCULAR | Status: DC

## 2014-07-09 MED ORDER — METHYLPREDNISOLONE ACETATE 40 MG/ML IJ SUSP
40.0000 mg | Freq: Once | INTRAMUSCULAR | Status: AC
  Administered 2014-07-09: 40 mg via INTRA_ARTICULAR

## 2014-07-09 MED ORDER — METHYLPREDNISOLONE ACETATE 40 MG/ML IJ SUSP
INTRAMUSCULAR | Status: AC
  Filled 2014-07-09: qty 2

## 2014-07-09 NOTE — ED Provider Notes (Addendum)
CSN: [id number]     Arrival date & time 07/09/14  1526 History   None    Chief Complaint  Patient presents with  . Shoulder Pain   (Consider location/radiation/quality/duration/timing/severity/associated sxs/prior Treatment) Patient is a 79 y.o. male presenting with shoulder pain.  Shoulder Pain Location:  Shoulder (B/L shoulder pain worse on the left.) Affected location: both  Pain details:    Quality:  Aching   Radiates to:  L arm, R arm, L elbow, R elbow, L wrist and R wrist   Severity:  Severe   Onset quality:  Gradual (started 1 wk ago)   Duration:  7 days   Timing:  Constant   Progression:  Worsening Chronicity:  Recurrent (He normally gets steroid inject with Dr Nolen Mu at [facility]. And also at the [facility] pain management where he got shot in his shoulder 3 months ago) Prior injury to area:  No Relieved by: Steroid injection. Worsened by:  Movement Associated symptoms: decreased range of motion and numbness   Associated symptoms: no fever, no stiffness and no swelling    HTN:No antihypertensive agent on record. He stated his PCP takes care of this.  Past Medical History  Diagnosis Date  . Diabetes mellitus   . Hypertension   . GERD (gastroesophageal reflux disease)   . Anxiety   . Depression   . Peripheral vascular disease   . Neuropathy   . Sleep apnea   . Avascular necrosis   . BPH (benign prostatic hyperplasia)   . DVT (deep venous thrombosis)   . History of leg amputation     both legs   Past Surgical History  Procedure Laterality Date  . Leg amputation through knee  06/2004  . Abdominal aortic aneurysm repair  1988  . Back surgery  1988, 1989  . Esophagogastroduodenoscopy N/A 04/27/2012    Procedure: ESOPHAGOGASTRODUODENOSCOPY (EGD);  Surgeon: Shirley Friar, MD;  Location: [facility];  Service: Endoscopy;  Laterality: N/A;  . Balloon dilation N/A 04/27/2012    Procedure: BALLOON DILATION;  Surgeon: Shirley Friar, MD;  Location: [facility]  END[facility];  Service: Endoscopy;  Laterality: N/A;  no xray  . Bypass graft     Family History  Problem Relation Age of Onset  . Heart attack    . Cancer    . Cancer    . Heart attack    . Esophageal cancer Daughter     Living, 32   History  Substance Use Topics  . Smoking status: Former Smoker -- 0.25 packs/day for 30 years    Types: Cigarettes    Quit date: 08/06/1991  . Smokeless tobacco: Never Used  . Alcohol Use: No    Review of Systems  Constitutional: Negative for fever.  Respiratory: Negative.   Musculoskeletal: Positive for arthralgias. Negative for joint swelling and stiffness.       B/L shoulder pain    Allergies  Ace inhibitors; Dilaudid; Metformin and related; Morphine and related; and Pletal  Home Medications   Prior to Admission medications   Medication Sig Start Date End Date Taking? Authorizing Provider  budesonide (PULMICORT) 0.5 MG/2ML nebulizer solution Take 2 mLs (0.5 mg total) by nebulization 2 (two) times daily. 12/04/13  Yes Lupita Leash, MD  FLUoxetine (PROZAC) 20 MG capsule Take 20 mg by mouth daily.   Yes Historical Provider, MD  gabapentin (NEURONTIN) 800 MG tablet Take 1 tablet (800 mg total) by mouth 3 (three) times daily. 02/17/13  Yes Donika Concha Se, DO  glimepiride (  AMARYL) 2 MG tablet Take 2 mg by mouth daily before breakfast.   Yes Historical Provider, MD  loratadine (CLARITIN) 10 MG tablet Take 1 tablet (10 mg total) by mouth daily. 03/30/12  Yes Meredeth Ide, MD  omeprazole (PRILOSEC) 20 MG capsule Take 20 mg by mouth daily.    Yes Historical Provider, MD  simvastatin (ZOCOR) 40 MG tablet Take 40 mg by mouth every evening.   Yes Historical Provider, MD  traZODone (DESYREL) 100 MG tablet Take 100 mg by mouth at bedtime.   Yes Historical Provider, MD  warfarin (COUMADIN) 4 MG tablet Take 2-4 mg by mouth as directed. Takes 1/2 tablet on Sunday Tuesday and Thursday and 1 whole tablet all other days   Yes Historical Provider, MD  albuterol  (PROVENTIL HFA;VENTOLIN HFA) 108 (90 BASE) MCG/ACT inhaler Inhale into the lungs every 6 (six) hours as needed for wheezing or shortness of breath.    Historical Provider, MD  arformoterol (BROVANA) 15 MCG/2ML NEBU Take 2 mLs (15 mcg total) by nebulization 2 (two) times daily as needed. 12/04/13   Lupita Leash, MD  HYDROcodone-acetaminophen (NORCO) 7.5-325 MG per tablet Take 1 tablet by mouth every 6 (six) hours as needed for moderate pain.    Historical Provider, MD  HYDROcodone-acetaminophen (NORCO/VICODIN) 5-325 MG per tablet Take 1 tablet by mouth every 8 (eight) hours as needed for pain.    Historical Provider, MD  Menthol, Topical Analgesic, (MINERAL ICE) 2 % GEL Apply 1 application topically 3 (three) times daily as needed (for extremity pain).    Historical Provider, MD   BP 171/68 mmHg  Pulse 58  Temp(Src) 98.1 F (36.7 C) (Oral)  Resp 20  SpO2 95% Physical Exam  Constitutional: He appears well-developed. No distress.  Cardiovascular: Normal rate.   Pulmonary/Chest: Effort normal.  Musculoskeletal:       Right shoulder: He exhibits decreased range of motion and tenderness. He exhibits no swelling, no crepitus and no deformity.       Left shoulder: He exhibits decreased range of motion and tenderness. He exhibits no swelling, no effusion, no deformity and no spasm.  Nursing note and vitals reviewed.   ED Course  Injection of joint Date/Time: 07/09/2014 4:38 PM Performed by: Janit Pagan T Authorized by: Janit Pagan T Consent: Verbal consent obtained. Written consent not obtained. Risks and benefits: risks, benefits and alternatives were discussed Consent given by: patient Patient understanding: patient states understanding of the procedure being performed Patient consent: the patient's understanding of the procedure matches consent given Procedure consent: procedure consent matches procedure scheduled Relevant documents: relevant documents present and verified Test  results: test results not available Site marked: the operative site was marked Imaging studies: imaging studies not available Patient identity confirmed: verbally with patient Time out: Immediately prior to procedure a "time out" was called to verify the correct patient, procedure, equipment, support staff and site/side marked as required. Preparation: Patient was prepped and draped in the usual sterile fashion. Local anesthesia used: yes Local anesthetic: lidocaine 2% without epinephrine Patient sedated: no Patient tolerance: Patient tolerated the procedure well with no immediate complications Comments: Left shoulder injection completed. 2 % lidocaine with 40 mg decadron.   (including critical care time) Labs Review Labs Reviewed - No data to display  Imaging Review No results found. Xray shoulder reviewed. Left shoulder ligamental tear. DJD changes seen on X-rays more on the left. Official report pending.  MDM  No diagnosis found. Bilateral shoulder pain, left worse than right.  HTN  Plan: Left shoulder intraarticular injection done today.          Patient tolerated procedure well.           Continue home hydrocodone prn pain.          Follow up with pain specialist as planned.          He is advised not to get any steroid joint injection in the next 3 months and he agreed with plan.  HTN: Follow up with PCP for reassessment.          Repeat BP check was 160s systolic.   Doreene Eland, MD 07/09/14 1747  Doreene Eland, MD 07/09/14 1610

## 2014-07-09 NOTE — Discharge Instructions (Signed)
Arthritis, Nonspecific °Arthritis is inflammation of a joint. This usually means pain, redness, warmth or swelling are present. One or more joints may be involved. There are a number of types of arthritis. Your caregiver may not be able to tell what type of arthritis you have right away. °CAUSES  °The most common cause of arthritis is the wear and tear on the joint (osteoarthritis). This causes damage to the cartilage, which can break down over time. The knees, hips, back and neck are most often affected by this type of arthritis. °Other types of arthritis and common causes of joint pain include: °· Sprains and other injuries near the joint. Sometimes minor sprains and injuries cause pain and swelling that develop hours later. °· Rheumatoid arthritis. This affects hands, feet and knees. It usually affects both sides of your body at the same time. It is often associated with chronic ailments, fever, weight loss and general weakness. °· Crystal arthritis. Gout and pseudo gout can cause occasional acute severe pain, redness and swelling in the foot, ankle, or knee. °· Infectious arthritis. Bacteria can get into a joint through a break in overlying skin. This can cause infection of the joint. Bacteria and viruses can also spread through the blood and affect your joints. °· Drug, infectious and allergy reactions. Sometimes joints can become mildly painful and slightly swollen with these types of illnesses. °SYMPTOMS  °· Pain is the main symptom. °· Your joint or joints can also be red, swollen and warm or hot to the touch. °· You may have a fever with certain types of arthritis, or even feel overall ill. °· The joint with arthritis will hurt with movement. Stiffness is present with some types of arthritis. °DIAGNOSIS  °Your caregiver will suspect arthritis based on your description of your symptoms and on your exam. Testing may be needed to find the type of arthritis: °· Blood and sometimes urine tests. °· X-ray tests  and sometimes CT or MRI scans. °· Removal of fluid from the joint (arthrocentesis) is done to check for bacteria, crystals or other causes. Your caregiver (or a specialist) will numb the area over the joint with a local anesthetic, and use a needle to remove joint fluid for examination. This procedure is only minimally uncomfortable. °· Even with these tests, your caregiver may not be able to tell what kind of arthritis you have. Consultation with a specialist (rheumatologist) may be helpful. °TREATMENT  °Your caregiver will discuss with you treatment specific to your type of arthritis. If the specific type cannot be determined, then the following general recommendations may apply. °Treatment of severe joint pain includes: °· Rest. °· Elevation. °· Anti-inflammatory medication (for example, ibuprofen) may be prescribed. Avoiding activities that cause increased pain. °· Only take over-the-counter or prescription medicines for pain and discomfort as recommended by your caregiver. °· Cold packs over an inflamed joint may be used for 10 to 15 minutes every hour. Hot packs sometimes feel better, but do not use overnight. Do not use hot packs if you are diabetic without your caregiver's permission. °· A cortisone shot into arthritic joints may help reduce pain and swelling. °· Any acute arthritis that gets worse over the next 1 to 2 days needs to be looked at to be sure there is no joint infection. °Long-term arthritis treatment involves modifying activities and lifestyle to reduce joint stress jarring. This can include weight loss. Also, exercise is needed to nourish the joint cartilage and remove waste. This helps keep the muscles   around the joint strong. °HOME CARE INSTRUCTIONS  °· Do not take aspirin to relieve pain if gout is suspected. This elevates uric acid levels. °· Only take over-the-counter or prescription medicines for pain, discomfort or fever as directed by your caregiver. °· Rest the joint as much as  possible. °· If your joint is swollen, keep it elevated. °· Use crutches if the painful joint is in your leg. °· Drinking plenty of fluids may help for certain types of arthritis. °· Follow your caregiver's dietary instructions. °· Try low-impact exercise such as: °¨ Swimming. °¨ Water aerobics. °¨ Biking. °¨ Walking. °· Morning stiffness is often relieved by a warm shower. °· Put your joints through regular range-of-motion. °SEEK MEDICAL CARE IF:  °· You do not feel better in 24 hours or are getting worse. °· You have side effects to medications, or are not getting better with treatment. °SEEK IMMEDIATE MEDICAL CARE IF:  °· You have a fever. °· You develop severe joint pain, swelling or redness. °· Many joints are involved and become painful and swollen. °· There is severe back pain and/or leg weakness. °· You have loss of bowel or bladder control. °Document Released: 01/30/2004 Document Revised: 03/16/2011 Document Reviewed: 02/15/2008 °[employer]® Patient Information ©2015 [employer]. This information is not intended to replace advice given to you by your health care provider. Make sure you discuss any questions you have with your health care provider. ° °

## 2014-07-09 NOTE — ED Notes (Signed)
C/o bilateral shoulder pain... Hx of rotator cuff inj Sees Orthopedic and is Rx Oxycodone and treated w/cortisone inj Denies inj/trauma Alert, no signs of acute distress.

## 2014-09-05 ENCOUNTER — Encounter: Payer: Self-pay | Admitting: Physical Medicine & Rehabilitation

## 2014-09-14 ENCOUNTER — Ambulatory Visit: Payer: Medicare Other | Admitting: Physical Medicine & Rehabilitation

## 2014-10-05 ENCOUNTER — Emergency Department (HOSPITAL_COMMUNITY)
Admission: EM | Admit: 2014-10-05 | Discharge: 2014-10-05 | Disposition: A | Discharge status: Home / Self Care | Payer: Medicare Other | Source: Home / Self Care | Attending: Emergency Medicine | Admitting: Emergency Medicine

## 2014-10-05 ENCOUNTER — Encounter (HOSPITAL_COMMUNITY): Payer: Self-pay | Admitting: Emergency Medicine

## 2014-10-05 DIAGNOSIS — S40022A Contusion of left upper arm, initial encounter: Secondary | ICD-10-CM | POA: Diagnosis not present

## 2014-10-05 LAB — PROTIME-INR
INR: 3.83 — ABNORMAL HIGH (ref 0.00–1.49)
Prothrombin Time: 36.8 seconds — ABNORMAL HIGH (ref 11.6–15.2)

## 2014-10-05 NOTE — ED Provider Notes (Signed)
CSN: [id number]     Arrival date & time 10/05/14  1746 History   First MD Initiated Contact with Patient 10/05/14 1850     Chief Complaint  Patient presents with  . Pain   (Consider location/radiation/quality/duration/timing/severity/associated sxs/prior Treatment) HPI Mark Russell is a 79 year old man here for evaluation of left arm problem. Mark Russell states Mark Russell noticed some bruising in the left upper arm yesterday. It has continued to get larger. Mark Russell states the bruising now goes into the medial aspect of his arm. Mark Russell denies any specific injury or trauma. Mark Russell denies feeling anything pop or tear in his arm or shoulder. Mark Russell does state Mark Russell has had rotator cuff issues and gets regular steroid injections. Last steroid injection was about 2 months ago. Mark Russell is on Coumadin. Mark Russell states his last INR was about 2 weeks ago and Mark Russell was told it was good.  Mark Russell does have some pain with the bruising.  Mark Russell is a bilateral leg amputee and uses an overhead system to transfer from bed to chair and chair to commode.  Past Medical History  Diagnosis Date  . Diabetes mellitus   . Hypertension   . GERD (gastroesophageal reflux disease)   . Anxiety   . Depression   . Peripheral vascular disease   . Neuropathy   . Sleep apnea   . Avascular necrosis   . BPH (benign prostatic hyperplasia)   . DVT (deep venous thrombosis)   . History of leg amputation     both legs   Past Surgical History  Procedure Laterality Date  . Leg amputation through knee  06/2004  . Abdominal aortic aneurysm repair  1988  . Back surgery  1988, 1989  . Esophagogastroduodenoscopy N/A 04/27/2012    Procedure: ESOPHAGOGASTRODUODENOSCOPY (EGD);  Surgeon: Shirley Friar, MD;  Location: [facility];  Service: Endoscopy;  Laterality: N/A;  . Balloon dilation N/A 04/27/2012    Procedure: BALLOON DILATION;  Surgeon: Shirley Friar, MD;  Location: [facility];  Service: Endoscopy;  Laterality: N/A;  no xray  . Bypass graft     Family History  Problem Relation  Age of Onset  . Heart attack    . Cancer    . Cancer    . Heart attack    . Esophageal cancer Daughter     Living, 51   Social History  Substance Use Topics  . Smoking status: Former Smoker -- 0.25 packs/day for 30 years    Types: Cigarettes    Quit date: 08/06/1991  . Smokeless tobacco: Never Used  . Alcohol Use: No    Review of Systems As in history of present illness Allergies  Ace inhibitors; Dilaudid; Metformin and related; Morphine and related; and Pletal  Home Medications   Prior to Admission medications   Medication Sig Start Date End Date Taking? Authorizing Provider  albuterol (PROVENTIL HFA;VENTOLIN HFA) 108 (90 BASE) MCG/ACT inhaler Inhale into the lungs every 6 (six) hours as needed for wheezing or shortness of breath.    Historical Provider, MD  arformoterol (BROVANA) 15 MCG/2ML NEBU Take 2 mLs (15 mcg total) by nebulization 2 (two) times daily as needed. 12/04/13   Lupita Leash, MD  budesonide (PULMICORT) 0.5 MG/2ML nebulizer solution Take 2 mLs (0.5 mg total) by nebulization 2 (two) times daily. 12/04/13   Lupita Leash, MD  FLUoxetine (PROZAC) 20 MG capsule Take 20 mg by mouth daily.    Historical Provider, MD  gabapentin (NEURONTIN) 800 MG tablet Take 1 tablet (800 mg total) by  mouth 3 (three) times daily. 02/17/13   Donika K Patel, DO  glimepiride (AMARYL) 2 MG tablet Take 2 mg by mouth daily before breakfast.    Historical Provider, MD  HYDROcodone-acetaminophen (NORCO) 7.5-325 MG per tablet Take 1 tablet by mouth every 6 (six) hours as needed for moderate pain.    Historical Provider, MD  HYDROcodone-acetaminophen (NORCO/VICODIN) 5-325 MG per tablet Take 1 tablet by mouth every 8 (eight) hours as needed for pain.    Historical Provider, MD  loratadine (CLARITIN) 10 MG tablet Take 1 tablet (10 mg total) by mouth daily. 03/30/12   Meredeth Ide, MD  Menthol, Topical Analgesic, (MINERAL ICE) 2 % GEL Apply 1 application topically 3 (three) times daily as  needed (for extremity pain).    Historical Provider, MD  omeprazole (PRILOSEC) 20 MG capsule Take 20 mg by mouth daily.     Historical Provider, MD  simvastatin (ZOCOR) 40 MG tablet Take 40 mg by mouth every evening.    Historical Provider, MD  traZODone (DESYREL) 100 MG tablet Take 100 mg by mouth at bedtime.    Historical Provider, MD  warfarin (COUMADIN) 4 MG tablet Take 2-4 mg by mouth as directed. Takes 1/2 tablet on Sunday Tuesday and Thursday and 1 whole tablet all other days    Historical Provider, MD   Meds Ordered and Administered this Visit  Medications - No data to display  BP 148/75 mmHg  Pulse 58  Temp(Src) 98.5 F (36.9 C) (Oral)  Resp 16  SpO2 95% No data found.   Physical Exam  Constitutional: Mark Russell is oriented to person, place, and time. Mark Russell appears well-developed and well-nourished. No distress.  Cardiovascular: Normal rate.   Pulmonary/Chest: Effort normal.  Neurological: Mark Russell is alert and oriented to person, place, and time.  Skin:  Large hematoma on volar upper arm extending medially. No obvious muscular deformity.    ED Course  Procedures (including critical care time)  Labs Review Labs Reviewed  PROTIME-INR - Abnormal; Notable for the following:    Prothrombin Time 36.8 (*)    INR 3.83 (*)    All other components within normal limits    Imaging Review No results found.   MDM   1. Hematoma of arm, left, initial encounter    INR is elevated at 3.83. I've instructed him to hold the dose tonight and tomorrow. Mark Russell will resume his normal dose on Sunday. Pressure wrap applied. Recommended icing to help with pain and bruising. I suspect this is from a tear in the biceps tendon or muscle belly. Mark Russell has an appointment with his primary care doctor on Tuesday. I instructed him to keep this appointment.    Charm Rings, MD 10/05/14 2022

## 2014-10-05 NOTE — ED Notes (Signed)
Patient waiting in lobby for blood work results.  Dr Piedad Climes aware and ok with this

## 2014-10-05 NOTE — Discharge Instructions (Signed)
I think you likely had a tear of your biceps muscle that caused bleeding. Your pain is coming from the hematoma or bruise. Keep the pressure wrap on for the next 24 hours. Apply ice to help with bruising and pain. Do not take ibuprofen or aspirin, as this may make the bruising worse. Your INR is elevated at 3.83. Do not take your dose tonight or tomorrow. Resume your normal dose on Sunday. Follow-up with your primary care doctor as scheduled on Tuesday.

## 2014-10-05 NOTE — ED Notes (Signed)
Bruise on arm, yesterday per information form  Dr Piedad Climes assessed patient prior to this nurse

## 2014-10-08 ENCOUNTER — Ambulatory Visit: Payer: Medicare Other | Admitting: Physical Medicine & Rehabilitation

## 2015-05-30 ENCOUNTER — Emergency Department (HOSPITAL_COMMUNITY)
Admission: EM | Admit: 2015-05-30 | Discharge: 2015-05-30 | Disposition: A | Discharge status: Home / Self Care | Payer: Medicare Other | Attending: Emergency Medicine | Admitting: Emergency Medicine

## 2015-05-30 ENCOUNTER — Encounter (HOSPITAL_COMMUNITY): Payer: Self-pay | Admitting: Emergency Medicine

## 2015-05-30 DIAGNOSIS — Z7901 Long term (current) use of anticoagulants: Secondary | ICD-10-CM | POA: Diagnosis not present

## 2015-05-30 DIAGNOSIS — E119 Type 2 diabetes mellitus without complications: Secondary | ICD-10-CM | POA: Diagnosis not present

## 2015-05-30 DIAGNOSIS — I10 Essential (primary) hypertension: Secondary | ICD-10-CM | POA: Diagnosis not present

## 2015-05-30 DIAGNOSIS — Z951 Presence of aortocoronary bypass graft: Secondary | ICD-10-CM | POA: Diagnosis not present

## 2015-05-30 DIAGNOSIS — K130 Diseases of lips: Secondary | ICD-10-CM | POA: Diagnosis not present

## 2015-05-30 DIAGNOSIS — Z87891 Personal history of nicotine dependence: Secondary | ICD-10-CM | POA: Diagnosis not present

## 2015-05-30 DIAGNOSIS — F329 Major depressive disorder, single episode, unspecified: Secondary | ICD-10-CM | POA: Diagnosis not present

## 2015-05-30 DIAGNOSIS — L0390 Cellulitis, unspecified: Secondary | ICD-10-CM

## 2015-05-30 DIAGNOSIS — L0291 Cutaneous abscess, unspecified: Secondary | ICD-10-CM

## 2015-05-30 DIAGNOSIS — R220 Localized swelling, mass and lump, head: Secondary | ICD-10-CM | POA: Diagnosis present

## 2015-05-30 LAB — COMPREHENSIVE METABOLIC PANEL
ALT: 9 U/L — ABNORMAL LOW (ref 17–63)
AST: 12 U/L — ABNORMAL LOW (ref 15–41)
Albumin: 3.8 g/dL (ref 3.5–5.0)
Alkaline Phosphatase: 53 U/L (ref 38–126)
Anion gap: 8 (ref 5–15)
BUN: 8 mg/dL (ref 6–20)
CO2: 32 mmol/L (ref 22–32)
Calcium: 8.8 mg/dL — ABNORMAL LOW (ref 8.9–10.3)
Chloride: 97 mmol/L — ABNORMAL LOW (ref 101–111)
Creatinine, Ser: 0.63 mg/dL (ref 0.61–1.24)
GFR calc Af Amer: >60 mL/min (ref >60)
GFR calc non Af Amer: >60 mL/min (ref >60)
Glucose, Bld: 78 mg/dL (ref 65–99)
Potassium: 3.7 mmol/L (ref 3.5–5.1)
Sodium: 137 mmol/L (ref 135–145)
Total Bilirubin: 0.7 mg/dL (ref 0.3–1.2)
Total Protein: 7.9 g/dL (ref 6.5–8.1)

## 2015-05-30 LAB — CBC WITH DIFFERENTIAL/PLATELET
Basophils Absolute: 0.1 10*3/uL (ref 0.0–0.1)
Basophils Relative: 1 %
Eosinophils Absolute: 0.3 10*3/uL (ref 0.0–0.7)
Eosinophils Relative: 2 %
HCT: 43.2 % (ref 39.0–52.0)
Hemoglobin: 14.2 g/dL (ref 13.0–17.0)
Lymphocytes Relative: 9 %
Lymphs Abs: 1.3 10*3/uL (ref 0.7–4.0)
MCH: 29.3 pg (ref 26.0–34.0)
MCHC: 32.9 g/dL (ref 30.0–36.0)
MCV: 89.1 fL (ref 78.0–100.0)
Monocytes Absolute: 1.7 10*3/uL — ABNORMAL HIGH (ref 0.1–1.0)
Monocytes Relative: 13 %
Neutro Abs: 10 10*3/uL — ABNORMAL HIGH (ref 1.7–7.7)
Neutrophils Relative %: 75 %
Platelets: 314 10*3/uL (ref 150–400)
RBC: 4.85 MIL/uL (ref 4.22–5.81)
RDW: 14.8 % (ref 11.5–15.5)
WBC: 13.3 10*3/uL — ABNORMAL HIGH (ref 4.0–10.5)

## 2015-05-30 MED ORDER — LIDOCAINE HCL (PF) 1 % IJ SOLN
5.0000 mL | Freq: Once | INTRAMUSCULAR | Status: AC
  Administered 2015-05-30: 5 mL
  Filled 2015-05-30: qty 30

## 2015-05-30 MED ORDER — SODIUM CHLORIDE 0.9 % IV BOLUS (SEPSIS)
1000.0000 mL | Freq: Once | INTRAVENOUS | Status: AC
  Administered 2015-05-30: 1000 mL via INTRAVENOUS

## 2015-05-30 MED ORDER — VANCOMYCIN HCL IN DEXTROSE 1-5 GM/200ML-% IV SOLN
1000.0000 mg | Freq: Once | INTRAVENOUS | Status: AC
  Administered 2015-05-30: 1000 mg via INTRAVENOUS
  Filled 2015-05-30: qty 200

## 2015-05-30 NOTE — ED Notes (Signed)
Nurse will draw labs. 

## 2015-05-30 NOTE — Progress Notes (Signed)
Pharmacy Antibiotic Note  Mark Russell is a 80 y.o. male admitted on 05/30/2015 with worsening swelling and abscess on lower lip. Started doxycycline 05/28/15.  Pharmacy has been consulted for Vancomycin dosing. Patient is a double amputee and weight is 118 lbs.  Plan: Vancomycin 1g IV every 12 hours.  Goal trough 15-20 mcg/mL.  Measure Vanc trough at steady state. Follow up renal fxn, culture results, and clinical course.     Temp (24hrs), Avg:98.9 F (37.2 C), Min:98.9 F (37.2 C), Max:98.9 F (37.2 C)  No results for input(s): WBC, CREATININE, LATICACIDVEN, VANCOTROUGH, VANCOPEAK, VANCORANDOM, GENTTROUGH, GENTPEAK, GENTRANDOM, TOBRATROUGH, TOBRAPEAK, TOBRARND, AMIKACINPEAK, AMIKACINTROU, AMIKACIN in the last 168 hours.  CrCl cannot be calculated (Unknown ideal weight.).    Allergies  Allergen Reactions  . Ace Inhibitors     hypotension  . Dilaudid [Hydromorphone Hcl]     "Mean"  . Metformin And Related     GI upset  . Morphine And Related Other (See Comments)    "mean"  . Pletal [Cilostazol]     SOB    Antimicrobials this admission: Vanc 5/25 >>   Dose adjustments this admission:   Microbiology results:   Thank you for allowing pharmacy to be a part of this patient's care.  Charolotte Eke, PharmD, pager [phone]. 05/30/2015,12:42 PM.

## 2015-05-30 NOTE — ED Notes (Signed)
Per pt, states he mashed bump on lower lip on Monday-woke up on Tuesday and lip was swollen-states he went and saw PCP and was given Doxy-states lower lip much more swolllen

## 2015-05-30 NOTE — ED Notes (Signed)
Bed: WA23 Expected date:  Expected time:  Means of arrival:  Comments: Hold for hall c 

## 2015-05-30 NOTE — ED Provider Notes (Signed)
CSN: [id number]     Arrival date & time 05/30/15  1118 History   First MD Initiated Contact with Patient 05/30/15 1134     Chief Complaint  Patient presents with  . Oral Swelling  PT THOUGHT HE HAD A BLACK HEAD ON HIS LOWER LIP ON Monday MAY 22.  HE WOKE UP THE NEXT DAY WITH MORE SWELLING AND SAY HIS PCP.  HIS PCP PUT HIM ON DOXYCYCLINE WHICH HE HAS BEEN TAKING FOR 2 DAYS.  THE PT SAID THE SWELLING IS WORSE.  PT SAID THAT HE CAN'T DRINK OR EAT DUE TO THE SWELLING.   (Consider location/radiation/quality/duration/timing/severity/associated sxs/prior Treatment) The history is provided by the patient.    Past Medical History  Diagnosis Date  . Diabetes mellitus   . Hypertension   . GERD (gastroesophageal reflux disease)   . Anxiety   . Depression   . Peripheral vascular disease (HCC)   . Neuropathy (HCC)   . Sleep apnea   . Avascular necrosis (HCC)   . BPH (benign prostatic hyperplasia)   . DVT (deep venous thrombosis) (HCC)   . History of leg amputation     both legs   Past Surgical History  Procedure Laterality Date  . Leg amputation through knee  06/2004  . Abdominal aortic aneurysm repair  1988  . Back surgery  1988, 1989  . Esophagogastroduodenoscopy N/A 04/27/2012    Procedure: ESOPHAGOGASTRODUODENOSCOPY (EGD);  Surgeon: Shirley Friar, MD;  Location: [facility];  Service: Endoscopy;  Laterality: N/A;  . Balloon dilation N/A 04/27/2012    Procedure: BALLOON DILATION;  Surgeon: Shirley Friar, MD;  Location: [facility];  Service: Endoscopy;  Laterality: N/A;  no xray  . Bypass graft     Family History  Problem Relation Age of Onset  . Heart attack    . Cancer    . Cancer    . Heart attack    . Esophageal cancer Daughter     Living, 76   Social History  Substance Use Topics  . Smoking status: Former Smoker -- 0.25 packs/day for 30 years    Types: Cigarettes    Quit date: 08/06/1991  . Smokeless tobacco: Never Used  . Alcohol Use: No    Review of  Systems  HENT: Positive for facial swelling.   All other systems reviewed and are negative.     Allergies  Ace inhibitors; Dilaudid; Metformin and related; Morphine and related; and Pletal  Home Medications   Prior to Admission medications   Medication Sig Start Date End Date Taking? Authorizing Provider  arformoterol (BROVANA) 15 MCG/2ML NEBU Take 2 mLs (15 mcg total) by nebulization 2 (two) times daily as needed. 12/04/13  Yes Lupita Leash, MD  budesonide (PULMICORT) 0.5 MG/2ML nebulizer solution Take 2 mLs (0.5 mg total) by nebulization 2 (two) times daily. 12/04/13  Yes Lupita Leash, MD  gabapentin (NEURONTIN) 800 MG tablet Take 1 tablet (800 mg total) by mouth 3 (three) times daily. 02/17/13  Yes Donika K Patel, DO  albuterol (PROVENTIL HFA;VENTOLIN HFA) 108 (90 BASE) MCG/ACT inhaler Inhale into the lungs every 6 (six) hours as needed for wheezing or shortness of breath.    Historical Provider, MD  FLUoxetine (PROZAC) 20 MG capsule Take 20 mg by mouth daily.    Historical Provider, MD  glimepiride (AMARYL) 2 MG tablet Take 2 mg by mouth daily before breakfast.    Historical Provider, MD  HYDROcodone-acetaminophen (NORCO) 7.5-325 MG per tablet Take 1 tablet by mouth  every 6 (six) hours as needed for moderate pain.    Historical Provider, MD  HYDROcodone-acetaminophen (NORCO/VICODIN) 5-325 MG per tablet Take 1 tablet by mouth every 8 (eight) hours as needed for pain.    Historical Provider, MD  loratadine (CLARITIN) 10 MG tablet Take 1 tablet (10 mg total) by mouth daily. 03/30/12   Meredeth Ide, MD  Menthol, Topical Analgesic, (MINERAL ICE) 2 % GEL Apply 1 application topically 3 (three) times daily as needed (for extremity pain).    Historical Provider, MD  omeprazole (PRILOSEC) 20 MG capsule Take 20 mg by mouth daily.     Historical Provider, MD  simvastatin (ZOCOR) 40 MG tablet Take 40 mg by mouth every evening.    Historical Provider, MD  traZODone (DESYREL) 100 MG tablet  Take 100 mg by mouth at bedtime.    Historical Provider, MD  warfarin (COUMADIN) 4 MG tablet Take 2-4 mg by mouth as directed. Takes 1/2 tablet on Sunday Tuesday and Thursday and 1 whole tablet all other days    Historical Provider, MD   BP 135/99 mmHg  Pulse 84  Temp(Src) 98.9 F (37.2 C) (Oral)  Resp 18  Ht   Wt 260 lb (117.935 kg)  SpO2 93% Physical Exam  Constitutional: He is oriented to person, place, and time. He appears well-developed and well-nourished.  HENT:  Head: Normocephalic and atraumatic.  Right Ear: External ear normal.  Left Ear: External ear normal.  Nose: Nose normal.  Mouth/Throat: Oropharynx is clear and moist.    Eyes: Conjunctivae and EOM are normal. Pupils are equal, round, and reactive to light.  Neck: Normal range of motion. Neck supple.  Cardiovascular: Normal rate, regular rhythm, normal heart sounds and intact distal pulses.   Pulmonary/Chest: Effort normal and breath sounds normal.  Abdominal: Soft. Bowel sounds are normal.  Musculoskeletal:  BILATERAL AKA   Neurological: He is alert and oriented to person, place, and time.  Skin: Skin is warm and dry.  Psychiatric: He has a normal mood and affect. His behavior is normal. Judgment and thought content normal.  Nursing note and vitals reviewed.   ED Course  .Marland KitchenIncision and Drainage Date/Time: 05/30/2015 12:24 PM Performed by: Jacalyn Lefevre Authorized by: Jacalyn Lefevre Consent: Verbal consent obtained. Risks and benefits: risks, benefits and alternatives were discussed Consent given by: patient Patient understanding: patient states understanding of the procedure being performed Patient consent: the patient's understanding of the procedure matches consent given Procedure consent: procedure consent matches procedure scheduled Relevant documents: relevant documents present and verified Test results: test results available and properly labeled Required items: required blood products, implants,  devices, and special equipment available Patient identity confirmed: verbally with patient Time out: Immediately prior to procedure a "time out" was called to verify the correct patient, procedure, equipment, support staff and site/side marked as required. Type: abscess Body area: head Anesthesia: local infiltration Local anesthetic: lidocaine 1% without epinephrine Patient sedated: no Scalpel size: 11 Incision type: single with marsupialization Incision depth: dermal Complexity: simple Drainage: purulent Drainage amount: moderate Wound treatment: wound left open Patient tolerance: Patient tolerated the procedure well with no immediate complications   (including critical care time) Labs Review Labs Reviewed  COMPREHENSIVE METABOLIC PANEL - Abnormal; Notable for the following:    Chloride 97 (*)    Calcium 8.8 (*)    AST 12 (*)    ALT 9 (*)    All other components within normal limits  CBC WITH DIFFERENTIAL/PLATELET - Abnormal; Notable for the following:  WBC 13.3 (*)    Neutro Abs 10.0 (*)    Monocytes Absolute 1.7 (*)    All other components within normal limits    Imaging Review No results found. I have personally reviewed and evaluated these images and lab results as part of my medical decision-making.   EKG Interpretation None      MDM  PT STILL HAS A LARGE AMOUNT OF PUS INSIDE LOWER LIP, AND I SPOKE WITH HIM ABOUT ADMISSION WITH IV ABX B/C HE HAS FAILED OUTPATIENT TX.  HE REFUSES TO STAY.  PT TOLD TO CONTINUE CURRENT ABX AND CURRENT HOME PAIN MEDS.  RETURN IF WORSE.  PT D/W DR. Pollyann Kennedy (ENT) WHO TOLD PT TO CALL OFFICE FOR F/U. Final diagnoses:  Abscess and cellulitis       Jacalyn Lefevre, MD 05/30/15 1343

## 2015-05-30 NOTE — Discharge Instructions (Signed)
Abscess °An abscess (boil or furuncle) is an infected area on or under the skin. This area is filled with yellowish-white fluid (pus) and other material (debris). °HOME CARE  °· Only take medicines as told by your doctor. °· If you were given antibiotic medicine, take it as directed. Finish the medicine even if you start to feel better. °· If gauze is used, follow your doctor's directions for changing the gauze. °· To avoid spreading the infection: °¨ Keep your abscess covered with a bandage. °¨ Wash your hands well. °¨ Do not share personal care items, towels, or whirlpools with others. °¨ Avoid skin contact with others. °· Keep your skin and clothes clean around the abscess. °· Keep all doctor visits as told. °GET HELP RIGHT AWAY IF:  °· You have more pain, puffiness (swelling), or redness in the wound site. °· You have more fluid or blood coming from the wound site. °· You have muscle aches, chills, or you feel sick. °· You have a fever. °MAKE SURE YOU:  °· Understand these instructions. °· Will watch your condition. °· Will get help right away if you are not doing well or get worse. °  °This information is not intended to replace advice given to you by your health care provider. Make sure you discuss any questions you have with your health care provider. °  °Document Released: 06/10/2007 Document Revised: 06/23/2011 Document Reviewed: 03/07/2011 °Elsevier Interactive Patient Education ©2016 [employer] ° °

## 2015-07-15 ENCOUNTER — Ambulatory Visit (INDEPENDENT_AMBULATORY_CARE_PROVIDER_SITE_OTHER): Payer: Medicare Other | Admitting: Acute Care

## 2015-07-15 ENCOUNTER — Ambulatory Visit (INDEPENDENT_AMBULATORY_CARE_PROVIDER_SITE_OTHER)
Admission: RE | Admit: 2015-07-15 | Discharge: 2015-07-15 | Disposition: A | Discharge status: Home / Self Care | Payer: Medicare Other | Source: Ambulatory Visit | Attending: Acute Care | Admitting: Acute Care

## 2015-07-15 ENCOUNTER — Encounter: Payer: Self-pay | Admitting: Acute Care

## 2015-07-15 VITALS — BP 124/70 | HR 90 | Wt 260.0 lb

## 2015-07-15 DIAGNOSIS — J411 Mucopurulent chronic bronchitis: Secondary | ICD-10-CM | POA: Diagnosis not present

## 2015-07-15 MED ORDER — DOXYCYCLINE HYCLATE 100 MG PO TABS: 100.0000 mg | ORAL_TABLET | Freq: Two times a day (BID) | ORAL | Status: DC

## 2015-07-15 MED ORDER — ARFORMOTEROL TARTRATE 15 MCG/2ML IN NEBU: 15.0000 ug | INHALATION_SOLUTION | Freq: Two times a day (BID) | RESPIRATORY_TRACT | Status: DC | PRN

## 2015-07-15 MED ORDER — BUDESONIDE 0.5 MG/2ML IN SUSP: 0.5000 mg | Freq: Two times a day (BID) | RESPIRATORY_TRACT | Status: DC

## 2015-07-15 NOTE — Assessment & Plan Note (Addendum)
Some old blood in sputum about once every 2 months. Wife is very concerned as he is a former smoker with significant family history of cancer. Will treat for bronchitis and follow up Plan: We will check a chest x ray today. We will call with CXR results. We will renew your Pulmicort and Brovana prescriptions  Today. Please take Doxycycline 100 mg twice daily for 7 days. Follow up in 1 month with either Dr. Kendrick Fries or myself. Please contact office for sooner follow up if symptoms do not improve or worsen or seek emergency care.

## 2015-07-15 NOTE — Progress Notes (Signed)
History of Present Illness Mark Russell is a 80 y.o. male former smoker who quit in 1993 with COPD ( Unable to grade due to bilateral AKA status).   7/10/2017Acute Office Visit: Pt states he has been coughing up old blood for about 1 year. He states that he has not had any weight loss. States that he has actually gained some weight.He does have a significant family history of cancer, and was a smoker in the past. Quit in 1993.He states that he coughs up quarter sized old bloody looking secretions about once every 2 months.His COPD is otherwise stable. He states he uses his Brovana and Pulmicort nebs infrequently but as needed. Last used this past Friday. He states that his secretions are yellowish usually.Marland Kitchen He denies fever, chest pain, orthopnea or hemoptysis. He states that he is 80 years old, and does not have his legs and really does not want to know if he has cancer.He is agreeable to a CXR today, and treatment for bronchitis with follow up in 1 month.He was told to call for any overt bleeding, or to seek emergency care.  Tests CXR 07/15/2015: IMPRESSION: Chronic bronchitic changes. Borderline cardiomegaly. Aortic atherosclerosis. No pneumonia, CHF, nor other acute cardiopulmonary abnormality.   Past medical hx Past Medical History  Diagnosis Date  . Diabetes mellitus   . Hypertension   . GERD (gastroesophageal reflux disease)   . Anxiety   . Depression   . Peripheral vascular disease (HCC)   . Neuropathy (HCC)   . Sleep apnea   . Avascular necrosis (HCC)   . BPH (benign prostatic hyperplasia)   . DVT (deep venous thrombosis) (HCC)   . History of leg amputation     both legs     Past surgical hx, Family hx, Social hx all reviewed.  Current Outpatient Prescriptions on File Prior to Visit  Medication Sig  . albuterol (PROVENTIL HFA;VENTOLIN HFA) 108 (90 BASE) MCG/ACT inhaler Inhale into the lungs every 6 (six) hours as needed for wheezing or shortness of breath.  Marland Kitchen  FLUoxetine (PROZAC) 20 MG capsule Take 20 mg by mouth daily.  Marland Kitchen gabapentin (NEURONTIN) 800 MG tablet Take 1 tablet (800 mg total) by mouth 3 (three) times daily.  Marland Kitchen glimepiride (AMARYL) 2 MG tablet Take 2 mg by mouth daily before breakfast.  . HYDROcodone-acetaminophen (NORCO) 7.5-325 MG per tablet Take 1 tablet by mouth every 6 (six) hours as needed for moderate pain.  Marland Kitchen HYDROcodone-acetaminophen (NORCO/VICODIN) 5-325 MG per tablet Take 1 tablet by mouth every 8 (eight) hours as needed for pain.  Marland Kitchen loratadine (CLARITIN) 10 MG tablet Take 1 tablet (10 mg total) by mouth daily.  . Menthol, Topical Analgesic, (MINERAL ICE) 2 % GEL Apply 1 application topically 3 (three) times daily as needed (for extremity pain).  Marland Kitchen omeprazole (PRILOSEC) 20 MG capsule Take 20 mg by mouth daily.   . simvastatin (ZOCOR) 40 MG tablet Take 40 mg by mouth every evening.  . traZODone (DESYREL) 100 MG tablet Take 100 mg by mouth at bedtime.  Marland Kitchen warfarin (COUMADIN) 4 MG tablet Take 2-4 mg by mouth as directed. Takes 1/2 tablet on Sunday Tuesday and Thursday and 1 whole tablet all other days   No current facility-administered medications on file prior to visit.     Allergies  Allergen Reactions  . Ace Inhibitors     hypotension  . Dilaudid [Hydromorphone Hcl]     "Mean"  . Metformin And Related     GI upset  .  Morphine And Related Other (See Comments)    "mean"  . Pletal [Cilostazol]     SOB    Review Of Systems:  Constitutional:   No  weight loss, night sweats,  Fevers, chills, fatigue, or  lassitude.  HEENT:   No headaches,  Difficulty swallowing,  Tooth/dental problems, or  Sore throat,                No sneezing, itching, ear ache, nasal congestion, post nasal drip,   CV:  No chest pain,  Orthopnea, PND, swelling in lower extremities, anasarca, dizziness, palpitations, syncope.   GI  No heartburn, indigestion, abdominal pain, nausea, vomiting, diarrhea, change in bowel habits, loss of appetite, bloody  stools.   Resp: No shortness of breath with exertion or at rest.  + excess mucus, + productive cough,  No non-productive cough,  + coughing up of blood.  + change in color of mucus.  + wheezing.  No chest wall deformity  Skin: no rash or lesions.  GU: no dysuria, change in color of urine, no urgency or frequency.  No flank pain, no hematuria   MS:  No joint pain or swelling.  No decreased range of motion.  No back pain.  Psych:  No change in mood or affect. No depression or anxiety.  No memory loss.   Vital Signs BP 124/70 mmHg  Pulse 90  Ht   Wt 260 lb (117.935 kg)  SpO2 94%   Physical Exam:  General- No distress,  A&Ox3, elderly male in a wheel chair. ENT: No sinus tenderness, TM clear, pale nasal mucosa, no oral exudate,no post nasal drip, no LAN Cardiac: S1, S2, regular rate and rhythm, no murmur Chest: + wheeze/ no rales/ dullness; no accessory muscle use, no nasal flaring, no sternal retractions Abd.: Soft Non-tender Ext: No clubbing cyanosis, edema Neuro:  normal strength Skin: No rashes, warm and dry Psych: normal mood and behavior   Assessment/Plan  COPD (chronic obstructive pulmonary disease) Some old blood in sputum about once every 2 months. Wife is very concerned as he is a former smoker with significant family history of cancer. Will treat for bronchitis and follow up Plan: We will check a chest x ray today. We will call with CXR results. We will renew your Pulmicort and Brovana prescriptions  Today. Please take Doxycycline 100 mg twice daily for 7 days. Follow up in 1 month with either Dr. Kendrick Fries or myself. Please contact office for sooner follow up if symptoms do not improve or worsen or seek emergency care.       Bevelyn Ngo, NP 07/15/2015  1:39 PM

## 2015-07-15 NOTE — Patient Instructions (Addendum)
It is nice to meet you today. We will check a chest x ray today. We will call with CXR results. We will renew your Pulmicort and Brovana prescriptions  Today. Please take Doxycycline 100 mg twice daily for 7 days. Follow up in 1 month with either Dr. Kendrick Fries or myself. Please contact office for sooner follow up if symptoms do not improve or worsen or seek emergency care.

## 2015-07-16 ENCOUNTER — Other Ambulatory Visit: Payer: Self-pay | Admitting: Acute Care

## 2015-07-16 NOTE — Telephone Encounter (Addendum)
Called pt with results of CXR and spoke with wife. Stated that all his neb meds were supposed to be sent to [facility]. I advised that the patient wanted them sent to [facility] - wife stated that this was incorrect and that these need to be sent to [facility]. Aware that this is being corrected and will be sent in the AM once I verify the pharmacy location.  Will hold in my box to follow up in AM

## 2015-07-17 ENCOUNTER — Telehealth: Payer: Self-pay | Admitting: Acute Care

## 2015-07-17 MED ORDER — BUDESONIDE 0.5 MG/2ML IN SUSP: 0.5000 mg | Freq: Two times a day (BID) | RESPIRATORY_TRACT | Status: DC

## 2015-07-17 MED ORDER — ARFORMOTEROL TARTRATE 15 MCG/2ML IN NEBU: 15.0000 ug | INHALATION_SOLUTION | Freq: Two times a day (BID) | RESPIRATORY_TRACT | Status: DC | PRN

## 2015-07-17 NOTE — Telephone Encounter (Signed)
Spoke with Mark Russell at [facility]- verified the correct pharmacy - [facility] in [place], [place]. Rx's sent. Nothing further needed.

## 2015-07-17 NOTE — Progress Notes (Signed)
Reviewed, agree 

## 2015-07-17 NOTE — Telephone Encounter (Signed)
Called and spoke with pts wife and she stated that [facility] on [address] needed the DW4 form signed by Kandice Robinsons so they can fill the brovanna rx.  This not in her look at forms and i have called the [facility] pharmacy to have this faxed to [place].

## 2015-07-18 NOTE — Telephone Encounter (Signed)
This form still has not been received. I have called and left a message with [facility] to refax this. Will await fax.

## 2015-07-19 NOTE — Telephone Encounter (Signed)
Spoke with pt's wife and offered samples until form could be completed. She states that they are not able to get to the office today to pick them up and they do not have anyone else that could come. Advised pt's wife that I would locate form and have completed. Found form in SG's front office folder, completed and had JN sign. Faxed to [facility] and called them to advise. They said they will have rx ready this afternoon for pick up. Called pt's wife and advised. Nothing further needed.

## 2015-07-19 NOTE — Telephone Encounter (Signed)
This form is still not in SG's look at or up front. I have called and left another message with [facility].

## 2015-07-19 NOTE — Telephone Encounter (Signed)
Jacklynn Lewis, patient's wife calling to check on status of Brovana.  She states he has been out of the medication and needs this today. Please call her at [phone] and advise if they will be able to get this today.

## 2015-08-08 ENCOUNTER — Telehealth: Payer: Self-pay | Admitting: Pulmonary Disease

## 2015-08-08 MED ORDER — ARFORMOTEROL TARTRATE 15 MCG/2ML IN NEBU: 15.0000 ug | INHALATION_SOLUTION | Freq: Two times a day (BID) | RESPIRATORY_TRACT | 0 refills | Status: DC | PRN

## 2015-08-08 MED ORDER — BUDESONIDE 0.5 MG/2ML IN SUSP: 0.5000 mg | Freq: Two times a day (BID) | RESPIRATORY_TRACT | 0 refills | Status: DC

## 2015-08-08 NOTE — Telephone Encounter (Signed)
Spoke with the pt's spouse  She states pt is needing a refill on brovana and pulmicort  Rxs sent x 1 only pending ov appt tomorrow

## 2015-08-09 ENCOUNTER — Ambulatory Visit (INDEPENDENT_AMBULATORY_CARE_PROVIDER_SITE_OTHER): Payer: Medicare Other | Admitting: Pulmonary Disease

## 2015-08-09 ENCOUNTER — Encounter: Payer: Self-pay | Admitting: Pulmonary Disease

## 2015-08-09 DIAGNOSIS — J411 Mucopurulent chronic bronchitis: Secondary | ICD-10-CM

## 2015-08-09 DIAGNOSIS — J40 Bronchitis, not specified as acute or chronic: Secondary | ICD-10-CM | POA: Insufficient documentation

## 2015-08-09 MED ORDER — DOXYCYCLINE HYCLATE 100 MG PO TABS: 100.0000 mg | ORAL_TABLET | Freq: Two times a day (BID) | ORAL | 0 refills | Status: DC

## 2015-08-09 NOTE — Patient Instructions (Addendum)
It was a pleasure taking care of you today!  You are diagnosed with bronchitis.  Make sure you take your antibiotics:  Doxycycline 100 mg/tab, 1 tab 2x/day. Continue with pulmicort and brovana nebulizer meds 2x/day.   We discussed potential side effects/adverse reactions of antibiotics, including, but not limited to: rash, diarrhea.  Please call the office if you are having adverse reaction to meds/antibiotics.  Please call the office your symptoms are getting worse despite the meds/antibiotics.   Return to clinic on 09/04/15 with Dr. Kendrick Fries

## 2015-08-09 NOTE — Progress Notes (Signed)
Subjective:    Patient ID: Mark Russell, male    DOB: Nov 04, 1935, 80 y.o.   MRN: [id number]  HPI  ROV 08/09/15 Pt is here urgently as f/u on cough. He was seen by Kandice Robinsons last month for bronchitis. During that time, he presented with acute cough, productive of purulent sputum, blood tinged. Chest x-ray was unremarkable. He was given doxycycline which she finished for 1 week. Cough got better. The last couple of days, he started coughing again, similar to back in June. Starting to have blood-tinged sputum again. Otherwise, denies fevers, chills, dyspnea. He takes Coumadin and he said his levels are being checked regularly. No other bleeding noted.   Review of Systems  Constitutional: Negative.   HENT: Positive for congestion.   Eyes: Negative.   Respiratory: Positive for cough.   Cardiovascular: Negative.   Gastrointestinal: Negative.   Endocrine: Negative.   Genitourinary: Negative.   Musculoskeletal: Negative.   Skin: Negative.   Allergic/Immunologic: Negative.   Neurological: Negative.   Hematological: Negative.   Psychiatric/Behavioral: Negative.   All other systems reviewed and are negative.  Past Medical History:  Diagnosis Date  . Anxiety   . Avascular necrosis (HCC)   . BPH (benign prostatic hyperplasia)   . Depression   . Diabetes mellitus   . DVT (deep venous thrombosis) (HCC)   . GERD (gastroesophageal reflux disease)   . History of leg amputation    both legs  . Hypertension   . Neuropathy (HCC)   . Peripheral vascular disease (HCC)   . Sleep apnea      Family History  Problem Relation Age of Onset  . Heart attack    . Cancer    . Cancer    . Heart attack    . Esophageal cancer Daughter     Living, 43     Past Surgical History:  Procedure Laterality Date  . ABDOMINAL AORTIC ANEURYSM REPAIR  1988  . BACK SURGERY  1988, 1989  . BALLOON DILATION N/A 04/27/2012   Procedure: BALLOON DILATION;  Surgeon: Shirley Friar, MD;  Location: [facility]  ENDOSCOPY;  Service: Endoscopy;  Laterality: N/A;  no xray  . BYPASS GRAFT    . ESOPHAGOGASTRODUODENOSCOPY N/A 04/27/2012   Procedure: ESOPHAGOGASTRODUODENOSCOPY (EGD);  Surgeon: Shirley Friar, MD;  Location: [facility];  Service: Endoscopy;  Laterality: N/A;  . LEG AMPUTATION THROUGH KNEE  06/2004    Social History   Social History  . Marital status: Married    Spouse name: N/A  . Number of children: 4  . Years of education: N/A   Occupational History  . disabled    Social History Main Topics  . Smoking status: Former Smoker    Packs/day: 0.25    Years: 30.00    Types: Cigarettes    Quit date: 08/06/1991  . Smokeless tobacco: Never Used  . Alcohol use No  . Drug use: Unknown  . Sexual activity: No   Other Topics Concern  . Not on file   Social History Narrative   Lives with wife of 57 years.  They have four grown children.           Allergies  Allergen Reactions  . Ace Inhibitors     hypotension  . Dilaudid [Hydromorphone Hcl]     "Mean"  . Metformin And Related     GI upset  . Morphine And Related Other (See Comments)    "mean"  . Pletal [Cilostazol]     SOB  Outpatient Medications Prior to Visit  Medication Sig Dispense Refill  . albuterol (PROVENTIL HFA;VENTOLIN HFA) 108 (90 BASE) MCG/ACT inhaler Inhale into the lungs every 6 (six) hours as needed for wheezing or shortness of breath.    Marland Kitchen arformoterol (BROVANA) 15 MCG/2ML NEBU Take 2 mLs (15 mcg total) by nebulization 2 (two) times daily as needed. 120 mL 0  . budesonide (PULMICORT) 0.5 MG/2ML nebulizer solution Take 2 mLs (0.5 mg total) by nebulization 2 (two) times daily. 120 mL 0  . FLUoxetine (PROZAC) 20 MG capsule Take 20 mg by mouth daily.    Marland Kitchen glimepiride (AMARYL) 2 MG tablet Take 2 mg by mouth daily before breakfast.    . loratadine (CLARITIN) 10 MG tablet Take 1 tablet (10 mg total) by mouth daily. 14 tablet 0  . Menthol, Topical Analgesic, (MINERAL ICE) 2 % GEL Apply 1 application  topically 3 (three) times daily as needed (for extremity pain).    Marland Kitchen omeprazole (PRILOSEC) 20 MG capsule Take 20 mg by mouth daily.     . simvastatin (ZOCOR) 40 MG tablet Take 40 mg by mouth every evening.    . warfarin (COUMADIN) 4 MG tablet Take 2-4 mg by mouth as directed. Takes 1/2 tablet on Sunday Tuesday and Thursday and 1 whole tablet all other days    . doxycycline (VIBRA-TABS) 100 MG tablet Take 1 tablet (100 mg total) by mouth 2 (two) times daily. 14 tablet 0  . gabapentin (NEURONTIN) 800 MG tablet Take 1 tablet (800 mg total) by mouth 3 (three) times daily. 90 tablet 6  . HYDROcodone-acetaminophen (NORCO) 7.5-325 MG per tablet Take 1 tablet by mouth every 6 (six) hours as needed for moderate pain.    Marland Kitchen HYDROcodone-acetaminophen (NORCO/VICODIN) 5-325 MG per tablet Take 1 tablet by mouth every 8 (eight) hours as needed for pain.    . traZODone (DESYREL) 100 MG tablet Take 100 mg by mouth at bedtime.     No facility-administered medications prior to visit.    Meds ordered this encounter  Medications  . gabapentin (NEURONTIN) 300 MG capsule    Sig: Take 3 capsules by mouth every 6 (six) hours.  . fentaNYL (DURAGESIC - DOSED MCG/HR) 25 MCG/HR patch    Sig: Place 1 patch onto the skin every other day.    Refill:  0  . DULoxetine (CYMBALTA) 30 MG capsule    Sig: Take 1 capsule by mouth daily.    Refill:  0  . AMITIZA 24 MCG capsule    Sig: Take 1 tablet by mouth 2 (two) times daily.  Marland Kitchen DISCONTD: oxyCODONE-acetaminophen (PERCOCET) 10-325 MG tablet    Sig: Take 1 tablet by mouth every 6 (six) hours.    Refill:  0  . traZODone (DESYREL) 150 MG tablet    Sig: Take 1 tablet by mouth daily.  Marland Kitchen doxycycline (VIBRA-TABS) 100 MG tablet    Sig: Take 1 tablet (100 mg total) by mouth 2 (two) times daily.    Dispense:  14 tablet    Refill:  0          Objective:   Physical Exam  Vitals:  Vitals:   08/09/15 1050  BP: (!) 142/82  Pulse: (!) 50  SpO2: 91%     Constitutional/General:  Pleasant, well-nourished, well-developed, not in any distress,  Comfortably seating on his  Wheelchair.Well kempt  There is no height or weight on file to calculate BMI. Wt Readings from Last 3 Encounters:  07/15/15 117.9 kg (260 lb)  05/30/15 117.9 kg (260 lb)  01/16/14 117.9 kg (260 lb)     HEENT: Pupils equal and reactive to light and accommodation. Anicteric sclerae. Normal nasal mucosa.   No oral  lesions,  mouth clear,  oropharynx clear, no postnasal drip. (-) Oral thrush. No dental caries.  Airway - Mallampati class IV  Neck: No masses. Midline trachea. No JVD, (-) LAD. (-) bruits appreciated.  Respiratory/Chest: Grossly normal chest. (-) deformity. (-) Accessory muscle use.  Symmetric expansion. (-) Tenderness on palpation.  Resonant on percussion.  Diminished BS on both lower lung zones. (-) wheezing, crackles, rhonchi (-) egophony  Cardiovascular: Regular rate and  rhythm, heart sounds normal, no murmur or gallops, no peripheral edema  Gastrointestinal:  Normal bowel sounds. Soft, non-tender. No hepatosplenomegaly.  (-) masses.   Musculoskeletal:  Normal muscle tone. S/P B amputation.   Extremities: Grossly normal. (-) clubbing, cyanosis. Sugrically absent BLE.    Skin: (-) rash,lesions seen.   Neurological/Psychiatric : alert, oriented to time, place, person. Normal mood and affect           Assessment & Plan:  Bronchitis Patient with acute on chronic bronchitis. He was seen last month with acute bronchitis and improved with antibiotics for a week. He has recurrence of symptoms now. Has yellow sputum, blood tinged as well. Denies fevers and chills. Chest x-ray last month without obvious infiltrate. Patient takes Coumadin and his levels have been checked regularly per patient. Plan : 1. Doxycycline, 100 mg twice a day for 7 days. Told patient to give [place] a call if he is not better after the doxycycline. At that point, he will  probably need a chest CT scan to make sure we are not missing any undertreated pneumonia.  2. He denies any nasal congestion or upper airway cough symptoms. 3. Denies falre up of GERD.    COPD (chronic obstructive pulmonary disease) Cont pulmicort and brovana BID.   I spent at least 25 minutes with the patient today and more than 50% was spent counseling the patient regarding disease and management and facilitating labs and medications.    Told pt to f/u with Dr. Kendrick Fries end of this month (or keep his appointment with him).      Pollie Meyer, MD 08/09/2015, 2:53 PM [facility] Pager [phone] After 3 pm or if no answer, call [phone]

## 2015-08-09 NOTE — Assessment & Plan Note (Signed)
Cont pulmicort and brovana BID.

## 2015-08-09 NOTE — Assessment & Plan Note (Addendum)
Patient with acute on chronic bronchitis. He was seen last month with acute bronchitis and improved with antibiotics for a week. He has recurrence of symptoms now. Has yellow sputum, blood tinged as well. Denies fevers and chills. Chest x-ray last month without obvious infiltrate. Patient takes Coumadin and his levels have been checked regularly per patient. Plan : 1. Doxycycline, 100 mg twice a day for 7 days. Told patient to give [place] a call if he is not better after the doxycycline. At that point, he will probably need a chest CT scan to make sure we are not missing any undertreated pneumonia.  2. He denies any nasal congestion or upper airway cough symptoms. 3. Denies falre up of GERD.

## 2015-09-04 ENCOUNTER — Ambulatory Visit: Payer: Medicare Other | Admitting: Pulmonary Disease

## 2016-01-07 ENCOUNTER — Encounter (HOSPITAL_COMMUNITY): Payer: Self-pay | Admitting: Emergency Medicine

## 2016-01-07 ENCOUNTER — Emergency Department (HOSPITAL_COMMUNITY)
Admission: EM | Admit: 2016-01-07 | Discharge: 2016-01-07 | Disposition: A | Discharge status: Home / Self Care | Payer: Medicare Other | Attending: Dermatology | Admitting: Dermatology

## 2016-01-07 DIAGNOSIS — Z5321 Procedure and treatment not carried out due to patient leaving prior to being seen by health care provider: Secondary | ICD-10-CM | POA: Insufficient documentation

## 2016-01-07 DIAGNOSIS — I10 Essential (primary) hypertension: Secondary | ICD-10-CM | POA: Diagnosis present

## 2016-01-07 NOTE — ED Notes (Signed)
This RN notified by EMT that pt left after rechecking vital signs.

## 2016-01-07 NOTE — ED Triage Notes (Addendum)
Per pt, states he was at the pain clinic and his BP was high-states they sent him here for eval-no CP, asymptomatic-states he has not taking BP meds today

## 2016-01-07 NOTE — ED Notes (Signed)
I rechecked pts vitals bp was 166/77, pt stated he felt better and was going home I encouraged pt to stay he stated if he felt worse he would come back but did not want to wait anymore.

## 2017-05-26 ENCOUNTER — Ambulatory Visit: Payer: Medicare Other | Attending: Family Medicine | Admitting: Physical Therapy

## 2017-05-26 DIAGNOSIS — M6281 Muscle weakness (generalized): Secondary | ICD-10-CM | POA: Diagnosis present

## 2017-05-26 DIAGNOSIS — R2689 Other abnormalities of gait and mobility: Secondary | ICD-10-CM | POA: Insufficient documentation

## 2017-05-27 ENCOUNTER — Other Ambulatory Visit: Payer: Self-pay

## 2017-05-27 ENCOUNTER — Encounter: Payer: Self-pay | Admitting: Physical Therapy

## 2017-05-27 NOTE — Therapy (Signed)
[facility] [address] [place], [place], [address] Phone: [phone]   Fax:  [phone]  Physical Therapy Evaluation  Patient Details  Name: Mark Russell MRN: [id number] Date of Birth: 02-Dec-1935 Referring Provider: Dr. Kelton Pillar   Encounter Date: 05/26/2017  PT End of Session - 05/27/17 0853    Visit Number  1    Authorization Type  [employer] Medicare    Authorization Time Period  05-26-17 - 06-26-17    PT Start Time  0933    PT Stop Time  1033    PT Time Calculation (min)  60 min       Past Medical History:  Diagnosis Date  . Anxiety   . Avascular necrosis ([place])   . BPH (benign prostatic hyperplasia)   . Depression   . Diabetes mellitus   . DVT (deep venous thrombosis) ([place])   . GERD (gastroesophageal reflux disease)   . History of leg amputation ([facility])    both legs  . Hypertension   . Neuropathy   . Peripheral vascular disease ([place])   . Sleep apnea     Past Surgical History:  Procedure Laterality Date  . ABDOMINAL AORTIC ANEURYSM REPAIR  1988  . [place]  . BALLOON DILATION N/A 04/27/2012   Procedure: BALLOON DILATION;  Surgeon: Lear Ng, MD;  Location: [facility];  Service: Endoscopy;  Laterality: N/A;  no xray  . BYPASS GRAFT    . ESOPHAGOGASTRODUODENOSCOPY N/A 04/27/2012   Procedure: ESOPHAGOGASTRODUODENOSCOPY (EGD);  Surgeon: Lear Ng, MD;  Location: [facility];  Service: Endoscopy;  Laterality: N/A;  . LEG AMPUTATION THROUGH KNEE  06/2004    There were no vitals filed for this visit.   Subjective Assessment - 05/27/17 0849    Subjective  Pt presents for power wheelchair evaluation is currently in [place] wheelchair with captain's seat with  foam cusion on top of captain's seat; pt s/p bil. AKA     Patient Stated Goals  obtain new power wheelchair    Currently in Pain?  No/denies         [facility] PT Assessment - 05/27/17 0001      Assessment    Medical Diagnosis  s/p bil. transfemoral amputation    Referring Provider  Dr. Kelton Pillar    Onset Date/Surgical Date  -- June 2006      Precautions   Precautions  Other (comment) pt is bil. AKA - nonambulatory      Balance Screen   Has the patient fallen in the past 6 months  No    Has the patient had a decrease in activity level because of a fear of falling?   No    Is the patient reluctant to leave their home because of a fear of falling?   No              Mobility/Seating Evaluation    PATIENT INFORMATION: Name: Mark Russell DOB: March 23, 2035  Sex: M Date seen: 05-26-17 Time: 0930  Address:  [address]                 [place], [place] [address] Physician: Dr. Kelton Pillar This evaluation/justification form will serve as the LMN for the following suppliers: __________________________ Supplier: [employer] Contact Person: Deberah Pelton, ATP Phone:  [phone]   Seating Therapist: Guido Sander, PT Phone:   [phone]   Phone: [phone]    Spouse/Parent/Caregiver name: ?????  Phone number: ????? Insurance/Payer: [employer] [facility] [employer]  Reason for Referral: power wheelchair evaluation  Patient/Caregiver Goals: obtain new power wheelchair  Patient was seen for face-to-face evaluation for new power wheelchair.  Also present was Deberah Pelton, ATP to discuss recommendations and wheelchair options.  Further paperwork was completed and sent to vendor.  Patient appears to qualify for power mobility device at this time per objective findings.   MEDICAL HISTORY: Diagnosis: Primary Diagnosis: s/p bilateral transfemoral amputation Onset: June 2006 Diagnosis: COPD   [] Progressive Disease Relevant past and future surgeries: ?????   Height: 71" Weight: 230# Explain recent changes or trends in weight: ?????   History including Falls: Pt reports no falls    HOME ENVIRONMENT: [x] House  [] Condo/town home  [] Apartment  [] Assisted Living    [] Lives Alone [x]  Lives with Others                                                                                           Hours with caregiver: 20+  [x] Home is accessible to patient           Stairs      [] Yes [x]  No     Ramp [x] Yes [] No Comments:  ?????   COMMUNITY ADL: TRANSPORTATION: [] Car    [x] Van    [] Public Transportation    [x] Adapted w/c Lift    [] Ambulance    [] Other:       [] Sits in wheelchair during transport  Employment/School: ????? Specific requirements pertaining to mobility ?????  Other: Pt transfers out of wheelchair for driving    FUNCTIONAL/SENSORY PROCESSING SKILLS:  Handedness:   [x] Right     [] Left    [] NA  Comments:  ?????  Functional Processing Skills for Wheeled Mobility [] Processing Skills are adequate for safe wheelchair operation  Areas of concern than may interfere with safe operation of wheelchair Description of problem   []  Attention to environment      [] Judgment      []  Hearing  []  Vision or visual processing      [] Motor Planning  []  Fluctuations in Behavior  ?????    VERBAL COMMUNICATION: [x] WFL receptive [x]  WFL expressive [] Understandable  [] Difficult to understand  [] non-communicative []  Uses an augmented communication device  CURRENT SEATING / MOBILITY: Current Mobility Base:  [] None [] Dependent [] Manual [] Scooter [x] Power  Type of Control: ?????  Manufacturer:  Chartered certified accountant 614Size:  [id number]: 7 yrs  Current Condition of Mobility Base:  in disrepair   Current Wheelchair components:  ?????  Describe posture in present seating system:  ?????      SENSATION and SKIN ISSUES: Sensation [x] Intact  [] Impaired [] Absent  Level of sensation: ????? Pressure Relief: Able to perform effective pressure relief :    [x] Yes  []  No Method: ???? If not, Why?: ?????  Skin Issues/Skin Integrity Current Skin Issues  [] Yes [x] No [] Intact []  Red area[]  Open Area  [] Scar Tissue [x] At risk from prolonged sitting Where  ?????  History of Skin Issues  [] Yes [x] No Where  ????? When  ?????  Hx of  skin flap surgeries  [] Yes [x] No Where  ????? When  ?????  Limited sitting tolerance [] Yes [] No Hours spent sitting in wheelchair daily: 8+  Complaint of Pain:  Please describe: Pt reports occasional pain in bil. LE's   Swelling/Edema: None   ADL STATUS (in reference to wheelchair use):  Indep Assist Unable Indep with Equip Not assessed Comments  Dressing ????? ????? ????? X ????? dresses himself from bed  Eating X ????? ????? ????? ????? ?????  Toileting ????? ????? ????? X ????? uses shower bench to transfer to commode   Bathing ????? ????? ????? X ????? has roll in shower  Grooming/Hygiene ????? ????? ????? X ????? performs from wheelchair   Meal Prep ????? ????? ????? X ????? performs from wheelchair  IADLS ????? ????? ????? X ????? performs from wheelchair  Bowel Management: [x] Continent  [] Incontinent  [] Accidents Comments:  ?????  Bladder Management: [x] Continent  [] Incontinent  [] Accidents Comments:  ?????     WHEELCHAIR SKILLS: Manual w/c Propulsion: [] UE or LE strength and endurance sufficient to participate in ADLs using manual wheelchair Arm : [] left [] right   [] Both      Distance: ????? Foot:  [] left [] right   [] Both  Operate Scooter: []  Strength, hand grip, balance and transfer appropriate for use [] Living environment is accessible for use of scooter  Operate Power w/c:  [x]  Std. Joystick   []  Alternative Controls Indep []  Assist []  Dependent/unable []  N/A []   [] Safe          []  Functional      Distance: ?????  Bed confined without wheelchair [x]  Yes []  No   STRENGTH/RANGE OF MOTION:  Active Range of Motion Strength  Shoulder Rt shoulder active flexion = 58 degrees;  abdct. = 62  Lt shoulder active flexion = 86 degrees:  abdct. = 84  Rt shoulder flexors 3-/5:  Rt abductors 3-/5 Lt shoulder flexors 3-/5:  Lt abductors 3-/5  Elbow WNL's bil. UE's 5/5 bil. elbow flexors and extensors  Wrist/Hand WNL's bil. UE's 5/5 bil. wrist flexors and extensors  Hip small  residual limb with LLE approx. 2" longer than RLE AROM of bil. residual limbs WFL's NT due to short residual limbs - pt is unable to stand/ambulate  Knee N/A - bil. transfemoral amputations N/A  Ankle N/A - N/A     MOBILITY/BALANCE:  []  Patient is totally dependent for mobility  ?????    Balance Transfers Ambulation  Sitting Balance: Standing Balance: [x]  Independent []  Independent/Modified Independent  [x]  WFL     []  WFL []  Supervision []  Supervision  []  Uses UE for balance  []  Supervision []  Min Assist []  Ambulates with Assist  ?????    []  Min Assist []  Min assist []  Mod Assist []  Ambulates with Device:      []  RW  []  StW  []  Cane  []  ?????  []  Mod Assist []  Mod assist []  Max assist   []  Max Assist []  Max assist []  Dependent []  Indep. Short Distance Only  []  Unable [x]  Unable []  Lift / Sling Required Distance (in feet)  ?????   [x]  Sliding board [x]  Unable to Ambulate (see explanation below)  Cardio Status:  [x] Intact  []  Impaired   []  NA     ?????  Respiratory Status:  [] Intact   [x] Impaired   [] NA     ?????  Orthotics/Prosthetics: Pt obtained prostheses for bil. LE's in 2006  Comments (Address manual vs power w/c vs scooter): ?????         Anterior / Posterior Obliquity Rotation-Pelvis ?????  PELVIS    []  [x]  []   Neutral Posterior Anterior  [x]  []  []   [facility]  Rt elev Lt elev  [x]  []  []   WFL Right Left                      Anterior    Anterior     []  Fixed []  Other []  Partly Flexible [x]  Flexible   []  Fixed []  Other []  Partly Flexible  [x]  Flexible  []  Fixed []  Other []  Partly Flexible  [x]  Flexible   TRUNK  []  [x]  []   WFL ? Thoracic ? Lumbar  Kyphosis Lordosis  [x]  []  []   WFL Convex Convex  Right Left [] c-curve [] s-curve [] multiple  [x]  Neutral []  Left-anterior []  Right-anterior     []  Fixed []  Flexible [x]  Partly Flexible []  Other  []  Fixed [x]  Flexible []  Partly Flexible []  Other  []  Fixed             [x]  Flexible []  Partly Flexible []  Other    Position  Windswept  ?????  HIPS          [x]            []               []    Neutral       Abduct        ADduct         [x]           []            []   Neutral Right           Left      []  Fixed []  Subluxed []  Partly Flexible []  Dislocated [x]  Flexible  []  Fixed []  Other []  Partly Flexible  [x]  Flexible                 Foot Positioning Knee Positioning  N/A for knees and feet - pt has bil. transfemoral amputations    []  WFL  [] Lt [] Rt []  WFL  [] Lt [] Rt    KNEES ROM concerns: ROM concerns:    & Dorsi-Flexed [] Lt [] Rt ?????    FEET Plantar Flexed [] Lt [] Rt      Inversion                 [] Lt [] Rt      Eversion                 [] Lt [] Rt     HEAD [x]  Functional [x]  Good Head Control  ?????  & []  Flexed         []  Extended []  Adequate Head Control    NECK []  Rotated  Lt  []  Lat Flexed Lt []  Rotated  Rt []  Lat Flexed Rt []  Limited Head Control     []  Cervical Hyperextension []  Absent  Head Control     SHOULDERS ELBOWS WRIST& HAND ?????      Left     Right    Left     Right    Left     Right   U/E [x] Functional           [x] Functional WNL's WNL's [] Fisting             [] Fisting      [] elev   [] dep      [] elev   [] dep       [] pro -[] retract     [] pro  [] retract [] subluxed             [] subluxed  Goals for Wheelchair Mobility  [x]  Independence with mobility in the home with motor related ADLs (MRADLs)  []  Independence with MRADLs in the community []  Provide dependent mobility  []  Provide recline     [] Provide tilt   Goals for Seating system [x]  Optimize pressure distribution [x]  Provide support needed to facilitate function or safety []  Provide corrective forces to assist with maintaining or improving posture []  Accommodate client's posture:   current seated postures and positions are not flexible or will not tolerate corrective forces []  Client to be independent with relieving pressure in the wheelchair [] Enhance physiological function such as breathing, swallowing, digestion   Simulation ideas/Equipment trials:????? State why other equipment was unsuccessful:?????   MOBILITY BASE RECOMMENDATIONS and JUSTIFICATION: MOBILITY COMPONENT JUSTIFICATION  Manufacturer: JazzyModel: 600 ES   Size: Width 20Seat Depth 20 [x] provide transport from point A to B      [x] promote Indep mobility  [x] is not a safe, functional ambulator [x] walker or cane inadequate [] non-standard width/depth necessary to accommodate anatomical measurement []  ?????  [] Manual Mobility Base [] non-functional ambulator    [] Scooter/POV  [] can safely operate  [] can safely transfer   [] has adequate trunk stability  [] cannot functionally propel manual w/c  [x] Power Mobility Base  [x] non-ambulatory  [x] cannot functionally propel manual wheelchair  [x]  cannot functionally and safely operate scooter/POV [x] can safely operate and willing to  [] Stroller Base [] infant/child  [] unable to propel manual wheelchair [] allows for growth [] non-functional ambulator [] non-functional UE [] Indep mobility is not a goal at this time  [] Tilt  [] Forward [] Backward [] Powered tilt  [] Manual tilt  [] change position against gravitational force on head and shoulders  [] change position for pressure relief/cannot weight shift [] transfers  [] management of tone [] rest periods [] control edema [] facilitate postural control  []  ?????  [] Recline  [] Power recline on power base [] Manual recline on manual base  [] accommodate femur to back angle  [] bring to full recline for ADL care  [] change position for pressure relief/cannot weight shift [] rest periods [] repositioning for transfers or clothing/diaper /catheter changes [] head positioning  [] Lighter weight required [] self- propulsion  [] lifting []  ?????  [] Heavy Duty required [] user weight greater than 250# [] extreme tone/ over active movement [] broken frame on previous chair []  ?????  [x]  Back  []  Angle Adjustable []  Custom molded Captain's Back [] postural  control [] control of tone/spasticity [] accommodation of range of motion [] UE functional control [] accommodation for seating system []  ????? [] provide lateral trunk support [] accommodate deformity [] provide posterior trunk support [] provide lumbar/sacral support [] support trunk in midline [] Pressure relief over spinal processes  [x]  Seat Cushion Captain's Seat [] impaired sensation  [] decubitus ulcers present [] history of pressure ulceration [] prevent pelvic extension [] low maintenance  [] stabilize pelvis  [] accommodate obliquity [] accommodate multiple deformity [] neutralize lower extremity position [] increase pressure distribution []  ?????  []  Pelvic/thigh support  []  Lateral thigh guide []  Distal medial pad  []  Distal lateral pad []  pelvis in neutral [] accommodate pelvis []  position upper legs []  alignment []  accommodate ROM []  decr adduction [] accommodate tone [] removable for transfers [] decr abduction  []  Lateral trunk Supports []  Lt     []  Rt [] decrease lateral trunk leaning [] control tone [] contour for increased contact [] safety  [] accommodate asymmetry []  ?????  []  Mounting hardware  [] lateral trunk supports  [] back   [] seat [] headrest      []  thigh support [] fixed   [] swing away [] attach seat platform/cushion to w/c frame [] attach back cushion to w/c frame [] mount postural supports [] mount headrest  [] swing medial thigh support away [] swing lateral supports away for transfers  []  ?????  Armrests  [] fixed [x] adjustable height [] removable   [] swing away  [x] flip back   [] reclining [x] full length pads [] desk    [] pads tubular  [x] provide support with elbow at 90   [] provide support for w/c tray [x] change of height/angles for variable activities [x] remove for transfers [x] allow to come closer to table top [] remove for access to tables []  ?????  Hangers/ Leg rests  [] 60 [] 70 [] 90 [] elevating [] heavy duty  [] articulating [] fixed [] lift off [] swing away      [] power [] provide LE support  [] accommodate to hamstring tightness [] elevate legs during recline   [] provide change in position for Legs [] Maintain placement of feet on footplate [] durability [] enable transfers [] decrease edema [] Accommodate lower leg length []  ?????  Foot support Footplate    [] Lt  []  Rt  []  Center mount [] flip up     [] depth/angle adjustable [] Amputee adapter    []  Lt     []  Rt [] provide foot support [] accommodate to ankle ROM [] transfers [] Provide support for residual extremity []  allow foot to go under wheelchair base []  decrease tone  []  ?????  []  Ankle strap/heel loops [] support foot on foot support [] decrease extraneous movement [] provide input to heel  [] protect foot  Tires: [] pneumatic  [x] flat free inserts  [] solid  [x] decrease maintenance  [x] prevent frequent flats [] increase shock absorbency [] decrease pain from road shock [] decrease spasms from road shock []  ?????  []  Headrest  [] provide posterior head support [] provide posterior neck support [] provide lateral head support [] provide anterior head support [] support during tilt and recline [] improve feeding   [] improve respiration [] placement of switches [] safety  [] accommodate ROM  [] accommodate tone [] improve visual orientation  []  Anterior chest strap []  Vest []  Shoulder retractors  [] decrease forward movement of shoulder [] accommodation of TLSO [] decrease forward movement of trunk [] decrease shoulder elevation [] added abdominal support [] alignment [] assistance with shoulder control  []  ?????  Pelvic Positioner [] Belt [] SubASIS bar [] Dual Pull [] stabilize tone [] decrease falling out of chair/ **will not Decr potential for sliding due to pelvic tilting [] prevent excessive rotation [] pad for protection over boney prominence [] prominence comfort [] special pull angle to control rotation []  ?????  Upper Extremity Support [] L   []  R [] Arm trough    [] hand support []  tray        [] full tray [] swivel mount [] decrease edema      [] decrease subluxation   [] control tone   [] placement for AAC/Computer/EADL [] decrease gravitational pull on shoulders [] provide midline positioning [] provide support to increase UE function [] provide hand support in natural position [] provide work surface   POWER WHEELCHAIR CONTROLS  [x] Proportional  [] Non-Proportional Type Joystick  [] Left  [x] Right [x] provides access for controlling wheelchair   [] lacks motor control to operate proportional drive control [] unable to understand proportional controls  Actuator Control Module  [] Single  [] Multiple   [] Allow the client to operate the power seat function(s) through the joystick control   [] Safety Reset Switches [] Used to change modes and stop the wheelchair when driving in latch mode    [] Upgraded Electronics   [] programming for accurate control [] progressive Disease/changing condition [] non-proportional drive control needed [] Needed in order to operate power seat functions through joystick control   [] Display box [] Allows user to see in which mode and drive the wheelchair is set  [] necessary for alternate controls    [] Digital interface electronics [] Allows w/c to operate when using alternative drive controls  [] ASL Head Array [] Allows client to operate wheelchair  through switches placed in tri-panel headrest  [] Sip and puff with tubing kit [] needed to  operate sip and puff drive controls  [] Upgraded tracking electronics [] increase safety when driving [] correct tracking when on uneven surfaces  [] Mount for switches or joystick [] Attaches switches to w/c  [] Swing away for access or transfers [] midline for optimal placement [] provides for consistent access  [] Attendant controlled joystick plus mount [] safety [] long distance driving [] operation of seat functions [] compliance with transportation regulations []  ?????    Rear wheel placement/Axle adjustability [] None [] semi  adjustable [] fully adjustable  [] improved UE access to wheels [] improved stability [] changing angle in space for improvement of postural stability [] 1-arm drive access [] amputee pad placement []  ?????  Wheel rims/ hand rims  [] metal  [] plastic coated [] oblique projections [] vertical projections [] Provide ability to propel manual wheelchair  []  Increase self-propulsion with hand weakness/decreased grasp  Push handles [] extended  [] angle adjustable  [] standard [] caregiver access [] caregiver assist [] allows "hooking" to enable increased ability to perform ADLs or maintain balance  One armed device  [] Lt   [] Rt [] enable propulsion of manual wheelchair with one arm   []  ?????   Brake/wheel lock extension []  Lt   []  Rt [] increase indep in applying wheel locks   [] Side guards [] prevent clothing getting caught in wheel or becoming soiled []  prevent skin tears/abrasions  Battery: Group 22 NF x 2  [x] to power wheelchair ?????  Other: ????? ????? ?????  The above equipment has a life- long use expectancy. Growth and changes in medical and/or functional conditions would be the exceptions. This is to certify that the therapist has no financial relationship with durable medical provider or manufacturer. The therapist will not receive remuneration of any kind for the equipment recommended in this evaluation.   Patient has mobility limitation that significantly impairs safe, timely participation in one or more mobility related ADL's.  (bathing, toileting, feeding, dressing, grooming, moving from room to room)                                                             [x]  Yes []  No Will mobility device sufficiently improve ability to participate and/or be aided in participation of MRADL's?         [x]  Yes []  No Can limitation be compensated for with use of a cane or walker?                                                                                []  Yes [x]  No Does patient or caregiver demonstrate  ability/potential ability & willingness to safely use the mobility device?   [x]  Yes []  No Does patient's home environment support use of recommended mobility device?                                                    [x]  Yes []  No Does patient have sufficient upper extremity function necessary to functionally propel a manual wheelchair?    []   Yes [x]  No Does patient have sufficient strength and trunk stability to safely operate a POV (scooter)?                                  []  Yes [x]  No Does patient need additional features/benefits provided by a power wheelchair for MRADL's in the home?       [x]  Yes []  No Does the patient demonstrate the ability to safely use a power wheelchair?                                                              [x]  Yes []  No  Therapist Name Printed: Guido Sander, PT Date: 05-26-17  Therapist's Signature:   Date:   Supplier's Name Printed: Mammie [employer] Date: 05-26-17  Supplier's Signature:   Date:  Patient/Caregiver Signature:   Date:     This is to certify that I have read this evaluation and do agree with the content within:      Physician's Name Printed: Kelton Pillar, MD  62 Signature:  Date:     This is to certify that I, the above signed therapist have the following affiliations: []  This DME provider []  Manufacturer of recommended equipment []  Patient's long term care facility [x]  None of the above                                Plan - 05/27/17 0854    Clinical Impression Statement  Pt is an 82 yr old gentleman s/p bil. AKA (June 2006) and COPD.  Pt presents for power wheelchair wheelchair evaluation with Josh Cadle, ATP with [facility].    History and Personal Factors relevant to plan of care:  bil. AKA (June 2006);  COPD    Clinical Presentation  Stable    Clinical Presentation due to:  bil. AKA, COPD    PT Frequency  One time visit    PT Treatment/Interventions  Other (comment) wheelchair management     Recommended Other Services  obtain [employer] power wheelchair from [place] and Agree with Plan of Care  Patient       Patient will benefit from skilled therapeutic intervention in order to improve the following deficits and impairments:  Hypomobility  Visit Diagnosis: Other abnormalities of gait and mobility - Plan: PT plan of care cert/re-cert  Muscle weakness (generalized) - Plan: PT plan of care cert/re-cert     Problem List Patient Active Problem List   Diagnosis Date Noted  . Bronchitis 08/09/2015  . OSA (obstructive sleep apnea) 11/17/2012  . COPD (chronic obstructive pulmonary disease) ([place]) 11/10/2012  . Dysphagia 03/29/2012    Class: Acute  . Chest pain 03/29/2012  . Diabetes type 2 with atherosclerosis of arteries of extremities ([place]) 03/29/2012  . Acute respiratory failure ([place]) 08/09/2011  . Hypoxemia 08/09/2011  . Respiratory acidosis 08/09/2011  . ARF (acute renal failure) ([place]) 08/07/2011  . Hypoxia 08/06/2011  . Leukocytosis 08/06/2011  . Sepsis(995.91) 08/06/2011  . PVD (peripheral vascular disease) ([place]) 08/06/2011  . S/P AKA (above knee amputation) bilateral ([place]) 08/06/2011  . DVT (deep venous thrombosis) ([place]) 08/06/2011    Mark Russell,  [place], PT 05/27/2017, 9:04 AM  [facility] [address] [place], [place], [address] Phone: [phone]   Fax:  [phone]  Name: Mark Russell MRN: [id number] Date of Birth: [birth year]

## 2019-06-15 ENCOUNTER — Other Ambulatory Visit: Payer: Self-pay | Admitting: Family Medicine

## 2019-06-15 ENCOUNTER — Ambulatory Visit
Admission: RE | Admit: 2019-06-15 | Discharge: 2019-06-15 | Disposition: A | Discharge status: Home / Self Care | Payer: Medicare Other | Source: Ambulatory Visit | Attending: Family Medicine | Admitting: Family Medicine

## 2019-06-15 DIAGNOSIS — J449 Chronic obstructive pulmonary disease, unspecified: Secondary | ICD-10-CM

## 2019-07-21 ENCOUNTER — Institutional Professional Consult (permissible substitution): Payer: Medicare Other | Admitting: Emergency Medicine

## 2020-01-25 DIAGNOSIS — G894 Chronic pain syndrome: Secondary | ICD-10-CM | POA: Diagnosis not present

## 2020-01-25 DIAGNOSIS — G546 Phantom limb syndrome with pain: Secondary | ICD-10-CM | POA: Diagnosis not present

## 2020-01-25 DIAGNOSIS — G4700 Insomnia, unspecified: Secondary | ICD-10-CM | POA: Diagnosis not present

## 2020-02-21 DIAGNOSIS — G4700 Insomnia, unspecified: Secondary | ICD-10-CM | POA: Diagnosis not present

## 2020-02-21 DIAGNOSIS — G546 Phantom limb syndrome with pain: Secondary | ICD-10-CM | POA: Diagnosis not present

## 2020-02-21 DIAGNOSIS — G894 Chronic pain syndrome: Secondary | ICD-10-CM | POA: Diagnosis not present

## 2020-03-15 DIAGNOSIS — G4700 Insomnia, unspecified: Secondary | ICD-10-CM | POA: Diagnosis not present

## 2020-03-15 DIAGNOSIS — G894 Chronic pain syndrome: Secondary | ICD-10-CM | POA: Diagnosis not present

## 2020-03-15 DIAGNOSIS — G546 Phantom limb syndrome with pain: Secondary | ICD-10-CM | POA: Diagnosis not present

## 2020-04-22 DIAGNOSIS — G4700 Insomnia, unspecified: Secondary | ICD-10-CM | POA: Diagnosis not present

## 2020-04-22 DIAGNOSIS — G546 Phantom limb syndrome with pain: Secondary | ICD-10-CM | POA: Diagnosis not present

## 2020-04-22 DIAGNOSIS — G894 Chronic pain syndrome: Secondary | ICD-10-CM | POA: Diagnosis not present

## 2020-05-08 DIAGNOSIS — E1121 Type 2 diabetes mellitus with diabetic nephropathy: Secondary | ICD-10-CM | POA: Diagnosis not present

## 2020-05-10 DIAGNOSIS — E1152 Type 2 diabetes mellitus with diabetic peripheral angiopathy with gangrene: Secondary | ICD-10-CM | POA: Diagnosis not present

## 2020-05-10 DIAGNOSIS — S40021A Contusion of right upper arm, initial encounter: Secondary | ICD-10-CM | POA: Diagnosis not present

## 2020-05-10 DIAGNOSIS — Z7901 Long term (current) use of anticoagulants: Secondary | ICD-10-CM | POA: Diagnosis not present

## 2020-05-10 DIAGNOSIS — I739 Peripheral vascular disease, unspecified: Secondary | ICD-10-CM | POA: Diagnosis not present

## 2020-05-10 DIAGNOSIS — Z899 Acquired absence of limb, unspecified: Secondary | ICD-10-CM | POA: Diagnosis not present

## 2020-05-10 DIAGNOSIS — G546 Phantom limb syndrome with pain: Secondary | ICD-10-CM | POA: Diagnosis not present

## 2020-05-10 DIAGNOSIS — D6869 Other thrombophilia: Secondary | ICD-10-CM | POA: Diagnosis not present

## 2020-05-22 DIAGNOSIS — Z79891 Long term (current) use of opiate analgesic: Secondary | ICD-10-CM | POA: Diagnosis not present

## 2020-05-22 DIAGNOSIS — G546 Phantom limb syndrome with pain: Secondary | ICD-10-CM | POA: Diagnosis not present

## 2020-05-22 DIAGNOSIS — G4700 Insomnia, unspecified: Secondary | ICD-10-CM | POA: Diagnosis not present

## 2020-05-22 DIAGNOSIS — G894 Chronic pain syndrome: Secondary | ICD-10-CM | POA: Diagnosis not present

## 2020-06-01 ENCOUNTER — Encounter (HOSPITAL_COMMUNITY): Payer: Self-pay

## 2020-06-01 ENCOUNTER — Emergency Department (HOSPITAL_COMMUNITY)
Admission: EM | Admit: 2020-06-01 | Discharge: 2020-06-01 | Disposition: A | Discharge status: Home / Self Care | Payer: Medicare Other | Attending: Emergency Medicine | Admitting: Emergency Medicine

## 2020-06-01 ENCOUNTER — Other Ambulatory Visit: Payer: Self-pay

## 2020-06-01 ENCOUNTER — Emergency Department (HOSPITAL_COMMUNITY): Payer: Medicare Other

## 2020-06-01 DIAGNOSIS — R21 Rash and other nonspecific skin eruption: Secondary | ICD-10-CM | POA: Diagnosis not present

## 2020-06-01 DIAGNOSIS — I10 Essential (primary) hypertension: Secondary | ICD-10-CM | POA: Insufficient documentation

## 2020-06-01 DIAGNOSIS — J449 Chronic obstructive pulmonary disease, unspecified: Secondary | ICD-10-CM | POA: Diagnosis not present

## 2020-06-01 DIAGNOSIS — M1612 Unilateral primary osteoarthritis, left hip: Secondary | ICD-10-CM | POA: Diagnosis not present

## 2020-06-01 DIAGNOSIS — E876 Hypokalemia: Secondary | ICD-10-CM | POA: Insufficient documentation

## 2020-06-01 DIAGNOSIS — L89322 Pressure ulcer of left buttock, stage 2: Secondary | ICD-10-CM | POA: Diagnosis not present

## 2020-06-01 DIAGNOSIS — L8990 Pressure ulcer of unspecified site, unspecified stage: Secondary | ICD-10-CM

## 2020-06-01 DIAGNOSIS — R059 Cough, unspecified: Secondary | ICD-10-CM | POA: Diagnosis not present

## 2020-06-01 DIAGNOSIS — Z7984 Long term (current) use of oral hypoglycemic drugs: Secondary | ICD-10-CM | POA: Diagnosis not present

## 2020-06-01 DIAGNOSIS — Z743 Need for continuous supervision: Secondary | ICD-10-CM | POA: Diagnosis not present

## 2020-06-01 DIAGNOSIS — U071 COVID-19: Secondary | ICD-10-CM | POA: Insufficient documentation

## 2020-06-01 DIAGNOSIS — L89321 Pressure ulcer of left buttock, stage 1: Secondary | ICD-10-CM

## 2020-06-01 DIAGNOSIS — Z7901 Long term (current) use of anticoagulants: Secondary | ICD-10-CM | POA: Diagnosis not present

## 2020-06-01 DIAGNOSIS — Z872 Personal history of diseases of the skin and subcutaneous tissue: Secondary | ICD-10-CM | POA: Diagnosis not present

## 2020-06-01 DIAGNOSIS — Z87891 Personal history of nicotine dependence: Secondary | ICD-10-CM | POA: Insufficient documentation

## 2020-06-01 DIAGNOSIS — L309 Dermatitis, unspecified: Secondary | ICD-10-CM | POA: Insufficient documentation

## 2020-06-01 DIAGNOSIS — E119 Type 2 diabetes mellitus without complications: Secondary | ICD-10-CM | POA: Insufficient documentation

## 2020-06-01 DIAGNOSIS — L8995 Pressure ulcer of unspecified site, unstageable: Secondary | ICD-10-CM | POA: Diagnosis not present

## 2020-06-01 DIAGNOSIS — R6889 Other general symptoms and signs: Secondary | ICD-10-CM | POA: Diagnosis not present

## 2020-06-01 DIAGNOSIS — R0902 Hypoxemia: Secondary | ICD-10-CM | POA: Diagnosis not present

## 2020-06-01 LAB — CBC WITH DIFFERENTIAL/PLATELET
Abs Immature Granulocytes: 0.03 K/uL (ref 0.00–0.07)
Basophils Absolute: 0 K/uL (ref 0.0–0.1)
Basophils Relative: 0 %
Eosinophils Absolute: 0 K/uL (ref 0.0–0.5)
Eosinophils Relative: 0 %
HCT: 52.7 % — ABNORMAL HIGH (ref 39.0–52.0)
Hemoglobin: 16.8 g/dL (ref 13.0–17.0)
Immature Granulocytes: 0 %
Lymphocytes Relative: 6 %
Lymphs Abs: 0.5 K/uL — ABNORMAL LOW (ref 0.7–4.0)
MCH: 29.7 pg (ref 26.0–34.0)
MCHC: 31.9 g/dL (ref 30.0–36.0)
MCV: 93.1 fL (ref 80.0–100.0)
Monocytes Absolute: 0.9 K/uL (ref 0.1–1.0)
Monocytes Relative: 11 %
Neutro Abs: 6.4 K/uL (ref 1.7–7.7)
Neutrophils Relative %: 83 %
Platelets: 256 K/uL (ref 150–400)
RBC: 5.66 MIL/uL (ref 4.22–5.81)
RDW: 14.8 % (ref 11.5–15.5)
WBC: 7.8 K/uL (ref 4.0–10.5)
nRBC: 0 % (ref 0.0–0.2)

## 2020-06-01 LAB — COMPREHENSIVE METABOLIC PANEL
ALT: 14 U/L (ref 0–44)
AST: 32 U/L (ref 15–41)
Albumin: 3.8 g/dL (ref 3.5–5.0)
Alkaline Phosphatase: 48 U/L (ref 38–126)
Anion gap: 14 (ref 5–15)
BUN: 13 mg/dL (ref 8–23)
CO2: 31 mmol/L (ref 22–32)
Calcium: 8.8 mg/dL — ABNORMAL LOW (ref 8.9–10.3)
Chloride: 93 mmol/L — ABNORMAL LOW (ref 98–111)
Creatinine, Ser: 0.86 mg/dL (ref 0.61–1.24)
GFR, Estimated: >60 mL/min (ref >60)
Glucose, Bld: 110 mg/dL — ABNORMAL HIGH (ref 70–99)
Potassium: 2.9 mmol/L — ABNORMAL LOW (ref 3.5–5.1)
Sodium: 138 mmol/L (ref 135–145)
Total Bilirubin: 0.4 mg/dL (ref 0.3–1.2)
Total Protein: 8.7 g/dL — ABNORMAL HIGH (ref 6.5–8.1)

## 2020-06-01 LAB — URINALYSIS, ROUTINE W REFLEX MICROSCOPIC
Bilirubin Urine: NEGATIVE
Glucose, UA: NEGATIVE mg/dL
Ketones, ur: 5 mg/dL — AB
Leukocytes,Ua: NEGATIVE
Nitrite: NEGATIVE
Protein, ur: 100 mg/dL — AB
Specific Gravity, Urine: 1.023 (ref 1.005–1.030)
pH: 5 (ref 5.0–8.0)

## 2020-06-01 LAB — RESP PANEL BY RT-PCR (FLU A&B, COVID) ARPGX2
Influenza A by PCR: NEGATIVE
Influenza B by PCR: NEGATIVE
SARS Coronavirus 2 by RT PCR: POSITIVE — AB

## 2020-06-01 LAB — MAGNESIUM: Magnesium: 2.4 mg/dL (ref 1.7–2.4)

## 2020-06-01 MED ORDER — POTASSIUM CHLORIDE CRYS ER 20 MEQ PO TBCR
60.0000 meq | EXTENDED_RELEASE_TABLET | Freq: Once | ORAL | Status: AC
  Administered 2020-06-01: 60 meq via ORAL
  Filled 2020-06-01: qty 3

## 2020-06-01 MED ORDER — POTASSIUM CHLORIDE ER 10 MEQ PO TBCR: 10.0000 meq | EXTENDED_RELEASE_TABLET | Freq: Every day | ORAL | 0 refills | Status: DC

## 2020-06-01 NOTE — Progress Notes (Signed)
   06/01/20 1330  TOC ED Mini Assessment  TOC Time spent with patient (minutes): 20  PING Used in TOC Assessment No  Admission or Readmission Diverted No  Interventions which prevented an admission or readmission Home Health Consult or Services ([facility])  What brought you to the Emergency Department?  He states that he has had a rash on his buttock for the past few days that has began to worsen  Barriers to Discharge No Barriers Identified  Barrier interventions HH recommendation for wound management, Place referral out to preferred [facility] accepted referral  Key Contact 1 attemped to contact daughter Inis Sizer [phone] with update no answer no VM option  Time spent coordinating care 20 mins.call [facility] to speak with patient no answer, left secure chat message to update ED RN  EDP updated and is agreement with transitional care plan

## 2020-06-01 NOTE — Discharge Instructions (Addendum)
I am going to prescribe Mark Russell a potassium supplement but I would like him to start taking once per day for the next 2 weeks.  Please make sure that he follows up with his regular doctor regarding his potassium levels to have this rechecked.  I have put in an order for home health needs.  They should be reaching out to you all regarding this.  This should begin next week. Their contact information is below.   Please work to [employer] as clean as possible.  I would recommend purchasing a bidet to help with this. Also begin applying a moisturizer to his gluteal region and move him frequently to different positions throughout the day.  You can use Vaseline to moisturize the region or continue to use the barrier cream that you were given in the emergency department.   Please continue to monitor Mark Russell's symptoms.  If he develops any new or worsening symptoms please bring him back to the emergency department for reevaluation.  It was a pleasure to meet you both.

## 2020-06-01 NOTE — ED Provider Notes (Signed)
[facility]-EMERGENCY DEPT Provider Note   CSN: [id number] Arrival date & time: 06/01/20  4098     History Chief Complaint  Patient presents with  . Skin Ulcer    Shannon Kirkendall is a 85 y.o. male.  HPI Patient is an 85 year old male with a history of DVT, bilateral AKA's, hypertension, PAD, BPH, avascular necrosis, who presents to the emergency department due to rash to the left buttock.  Patient brought in by his son who is his primary caregiver.  Patient has a history of dementia.  Patient denies any complaints to me.  He does note that he has had a rash on his buttock for the past few days that has began to worsen.  No drainage from the region.  Denies any chest pain or shortness of breath.  His son noted to EMS that patient has had a productive cough with yellow sputum as well as mild rhonchi but patient denies this.  He has not been vaccinated for COVID-19.  No home oxygen requirement.  No abdominal pain, nausea, vomiting, or diarrhea.  His daughter Asher Muir) is now at bedside.  She states that patient is developing mild dementia and has been more difficult to care for lately.  Also reports increased fatigue recently.  States patient is having difficulty caring for himself and they are having increasing difficulty caring for him due to his deficits.    Past Medical History:  Diagnosis Date  . Anxiety   . Avascular necrosis (HCC)   . BPH (benign prostatic hyperplasia)   . Depression   . Diabetes mellitus   . DVT (deep venous thrombosis) (HCC)   . GERD (gastroesophageal reflux disease)   . History of leg amputation (HCC)    both legs  . Hypertension   . Neuropathy   . Peripheral vascular disease (HCC)   . Sleep apnea     Patient Active Problem List   Diagnosis Date Noted  . Bronchitis 08/09/2015  . OSA (obstructive sleep apnea) 11/17/2012  . COPD (chronic obstructive pulmonary disease) (HCC) 11/10/2012  . Dysphagia 03/29/2012    Class: Acute  . Chest  pain 03/29/2012  . Diabetes type 2 with atherosclerosis of arteries of extremities (HCC) 03/29/2012  . Acute respiratory failure (HCC) 08/09/2011  . Hypoxemia 08/09/2011  . Respiratory acidosis 08/09/2011  . ARF (acute renal failure) (HCC) 08/07/2011  . Hypoxia 08/06/2011  . Leukocytosis 08/06/2011  . Sepsis(995.91) 08/06/2011  . PVD (peripheral vascular disease) (HCC) 08/06/2011  . S/P AKA (above knee amputation) bilateral (HCC) 08/06/2011  . DVT (deep venous thrombosis) (HCC) 08/06/2011    Past Surgical History:  Procedure Laterality Date  . ABDOMINAL AORTIC ANEURYSM REPAIR  1988  . BACK SURGERY  1988, 1989  . BALLOON DILATION N/A 04/27/2012   Procedure: BALLOON DILATION;  Surgeon: Shirley Friar, MD;  Location: [facility] ENDOSCOPY;  Service: Endoscopy;  Laterality: N/A;  no xray  . BYPASS GRAFT    . ESOPHAGOGASTRODUODENOSCOPY N/A 04/27/2012   Procedure: ESOPHAGOGASTRODUODENOSCOPY (EGD);  Surgeon: Shirley Friar, MD;  Location: [facility];  Service: Endoscopy;  Laterality: N/A;  . LEG AMPUTATION THROUGH KNEE  06/2004       Family History  Problem Relation Age of Onset  . Heart attack Other   . Cancer Other   . Cancer Other   . Heart attack Other   . Esophageal cancer Daughter        Living, 87    Social History   Tobacco Use  . Smoking  status: Former Smoker    Packs/day: 0.25    Years: 30.00    Pack years: 7.50    Types: Cigarettes    Quit date: 08/06/1991    Years since quitting: 28.8  . Smokeless tobacco: Never Used  Substance Use Topics  . Alcohol use: No    Home Medications Prior to Admission medications   Medication Sig Start Date End Date Taking? Authorizing Provider  potassium chloride (KLOR-CON) 10 MEQ tablet Take 1 tablet (10 mEq total) by mouth daily for 14 days. 06/01/20 06/15/20 Yes Placido Sou, PA-C  albuterol (PROVENTIL HFA;VENTOLIN HFA) 108 (90 BASE) MCG/ACT inhaler Inhale into the lungs every 6 (six) hours as needed for wheezing or  shortness of breath.    [provider]  AMITIZA 24 MCG capsule Take 1 tablet by mouth 2 (two) times daily. 07/15/15   [provider]  arformoterol (BROVANA) 15 MCG/2ML NEBU Take 2 mLs (15 mcg total) by nebulization 2 (two) times daily as needed. 08/08/15   Bevelyn Ngo, NP  budesonide (PULMICORT) 0.5 MG/2ML nebulizer solution Take 2 mLs (0.5 mg total) by nebulization 2 (two) times daily. 08/08/15   Bevelyn Ngo, NP  doxycycline (VIBRA-TABS) 100 MG tablet Take 1 tablet (100 mg total) by mouth 2 (two) times daily. 08/09/15   de Dios, [place] A, MD  DULoxetine (CYMBALTA) 30 MG capsule Take 1 capsule by mouth daily. 07/15/15   [provider]  fentaNYL (DURAGESIC - DOSED MCG/HR) 25 MCG/HR patch Place 1 patch onto the skin every other day. 07/15/15   [provider]  FLUoxetine (PROZAC) 20 MG capsule Take 20 mg by mouth daily.    [provider]  gabapentin (NEURONTIN) 300 MG capsule Take 3 capsules by mouth every 6 (six) hours. 06/07/15   [provider]  glimepiride (AMARYL) 2 MG tablet Take 2 mg by mouth daily before breakfast.    [provider]  loratadine (CLARITIN) 10 MG tablet Take 1 tablet (10 mg total) by mouth daily. 03/30/12   Meredeth Ide, MD  Menthol, Topical Analgesic, (MINERAL ICE) 2 % GEL Apply 1 application topically 3 (three) times daily as needed (for extremity pain).    [provider]  omeprazole (PRILOSEC) 20 MG capsule Take 20 mg by mouth daily.     [provider]  simvastatin (ZOCOR) 40 MG tablet Take 40 mg by mouth every evening.    [provider]  traZODone (DESYREL) 150 MG tablet Take 1 tablet by mouth daily. 06/07/15   [provider]  warfarin (COUMADIN) 4 MG tablet Take 2-4 mg by mouth as directed. Takes 1/2 tablet on Sunday Tuesday and Thursday and 1 whole tablet all other days    [provider]    Allergies    Ace inhibitors, Dilaudid [hydromorphone hcl],  Metformin and related, Morphine and related, and Pletal [cilostazol]  Review of Systems   Review of Systems  All other systems reviewed and are negative. Ten systems reviewed and are negative for acute change, except as noted in the HPI.   Physical Exam Updated Vital Signs BP (!) 176/73   Pulse 77   Temp 98.7 F (37.1 C) (Oral)   Resp 13   SpO2 98%   Physical Exam Vitals and nursing note reviewed.  Constitutional:      General: He is not in acute distress.    Appearance: Normal appearance. He is not ill-appearing, toxic-appearing or diaphoretic.  HENT:     Head: Normocephalic and  atraumatic.     Right Ear: External ear normal.     Left Ear: External ear normal.     Nose: Nose normal.     Mouth/Throat:     Mouth: Mucous membranes are moist.     Pharynx: Oropharynx is clear. No oropharyngeal exudate or posterior oropharyngeal erythema.  Eyes:     Extraocular Movements: Extraocular movements intact.  Cardiovascular:     Rate and Rhythm: Normal rate and regular rhythm.     Pulses: Normal pulses.     Heart sounds: Normal heart sounds. No murmur heard. No friction rub. No gallop.   Pulmonary:     Effort: Pulmonary effort is normal. No respiratory distress.     Breath sounds: Normal breath sounds. No stridor. No wheezing, rhonchi or rales.  Abdominal:     General: Abdomen is flat.     Palpations: Abdomen is soft.     Tenderness: There is no abdominal tenderness.     Comments: Large ventral hernia noted.  Easily reducible.  Abdomen is soft and nontender in all 4 quadrants.  Musculoskeletal:        General: Normal range of motion.     Cervical back: Normal range of motion and neck supple. No tenderness.     Comments: Bilateral AKA's.  Skin:    General: Skin is warm and dry.     Findings: Erythema present.     Comments: Diffuse erythema noted to the bilateral buttocks, left greater than right.  Developing stage I pressure ulcer on the left buttock.  Neurological:      General: No focal deficit present.     Mental Status: He is alert and oriented to person, place, and time.  Psychiatric:        Mood and Affect: Mood normal.        Behavior: Behavior normal.    ED Results / Procedures / Treatments   Labs (all labs ordered are listed, but only abnormal results are displayed) Labs Reviewed  RESP PANEL BY RT-PCR (FLU A&B, COVID) ARPGX2 - Abnormal; Notable for the following components:      Result Value   SARS Coronavirus 2 by RT PCR POSITIVE (*)    All other components within normal limits  CBC WITH DIFFERENTIAL/PLATELET - Abnormal; Notable for the following components:   HCT 52.7 (*)    Lymphs Abs 0.5 (*)    All other components within normal limits  COMPREHENSIVE METABOLIC PANEL - Abnormal; Notable for the following components:   Potassium 2.9 (*)    Chloride 93 (*)    Glucose, Bld 110 (*)    Calcium 8.8 (*)    Total Protein 8.7 (*)    All other components within normal limits  URINALYSIS, ROUTINE W REFLEX MICROSCOPIC - Abnormal; Notable for the following components:   Color, Urine AMBER (*)    APPearance HAZY (*)    Hgb urine dipstick MODERATE (*)    Ketones, ur 5 (*)    Protein, ur 100 (*)    Bacteria, UA RARE (*)    All other components within normal limits  MAGNESIUM   EKG None  Radiology DG Chest 1 View  Result Date: 06/01/2020 CLINICAL DATA:  Cough EXAM: CHEST  1 VIEW COMPARISON:  06/15/2019 FINDINGS: Mild scarring in the left upper lobe. Calcified granuloma in the left mid lung, benign. Right lung is clear, with surgical clips overlying the right upper hemithorax. No pleural effusion or pneumothorax. The heart is normal in size.  Thoracic aortic  atherosclerosis. IMPRESSION: No evidence of acute cardiopulmonary disease. Electronically Signed   By: Charline Bills M.D.   On: 06/01/2020 11:37   DG Pelvis 1-2 Views  Result Date: 06/01/2020 CLINICAL DATA:  Developing pressure ulcer. EXAM: PELVIS - 1-2 VIEW COMPARISON:  06/18/2006  FINDINGS: Severe left hip osteoarthritis with joint distortion, joint collapse, and sclerosis. There may have been a prior femoral neck fracture or AVN. Bilateral postoperative inguinal or hip regions. No gross destructive process or sacroiliac erosion. There is generalized osteopenia. IMPRESSION: 1. Very limited modality for detecting osteomyelitis. 2. Advanced left hip osteoarthritis. Electronically Signed   By: Marnee Spring M.D.   On: 06/01/2020 11:38   Procedures Procedures   Medications Ordered in ED Medications  potassium chloride SA (KLOR-CON) CR tablet 60 mEq (60 mEq Oral Given 06/01/20 1331)    ED Course  I have reviewed the triage vital signs and the nursing notes.  Pertinent labs & imaging results that were available during my care of the patient were reviewed by me and considered in my medical decision making (see chart for details).  Clinical Course as of 06/01/20 1400  Sat Jun 01, 2020  1309 SARS Coronavirus 2 by RT PCR(!): POSITIVE [LJ]    Clinical Course User Index [LJ] Placido Sou, PA-C   MDM Rules/Calculators/A&P                          Pt is a 85 y.o. male who presents to the emergency department with his daughter due to fatigue, irritation on the buttocks, as well as difficulty with patient's ADLs.  Labs: CBC with a hematocrit of 52.7 and lymphocytes of 0.5. CMP with a potassium of 2.9, chloride of 93, glucose of 110, calcium of 8.8, total protein of 8.7. UA with moderate hemoglobin, 5 ketones, 100 protein, rare bacteria.  Imaging: Chest x-ray is negative. X-ray of the pelvis shows advanced left hip osteoarthritis.  I, Placido Sou, PA-C, personally reviewed and evaluated these images and lab results as part of my medical decision-making.  Work-up today is generally reassuring.  Patient was hypokalemic at 2.9 with a magnesium within normal limits.  He was given 60 mEq of p.o. Klor-Con.  Will discharge patient on a course of Klor-Con for the next 2  weeks.  Recommended to his daughter that he follow-up with his regular doctor to have his potassium levels rechecked.  Patient appears to have dermatitis as well as a developing stage I ulcer to the left buttock.  Area was cleaned extensively by the nursing staff, barrier cream was applied, and new bandaging was applied as well.  Patient's daughter was given barrier cream to take home as well as additional bandages.  Patient was incidentally found to have COVID-19 today.  His daughter denies that he is having any symptoms concerning for COVID-19 at home.  No hypoxia noted during his stay in the ED.  Chest x-ray is negative.  I offered his daughter a prescription for Paxlovid or an IV Bebtelovimab infusion in the emergency department but she declined due to his lack of sx.   Given patient's difficulty with ADLs and family not requesting placement to a skilled nursing facility, I worked with our social work team to set up patient with home health.  This should begin early next week after the holiday weekend.  Discussed signs and symptoms of worsening COVID-19 with his daughter and recommended that he be brought back to the emergency department if they should  develop.  Feel the patient is stable for discharge at this time and she is agreeable.  Their questions were answered and they were amicable at the time of discharge.  Note: Portions of this report may have been transcribed using voice recognition software. Every effort was made to ensure accuracy; however, inadvertent computerized transcription errors may be present.   Final Clinical Impression(s) / ED Diagnoses Final diagnoses:  Pressure injury of left buttock, stage 1  Dermatitis  Hypokalemia   Rx / DC Orders ED Discharge Orders         Ordered    Ambulatory referral to Home Health       Comments: Please evaluate Qasim Diveley for admission to [facility].  Disciplines requested: Nursing  Services to provide: Other: help with mobility  as well as cleaning and general ADLs.  Physician to follow patient's care (the person listed here will be responsible for signing ongoing orders): PCP  Requested Start of Care Date: Within 2-3 days  I certify that this patient is under my care and that I, or a Nurse Practitioner or Physician Assistant working with me, had a face-to-face encounter that meets the physician face-to-face requirements with patient on 06/01/2020. The encounter with the patient was in whole, or in part for the following medical condition(s) which is the primary reason for home health care (List medical condition).   Special Instructions:   06/01/20 1144    potassium chloride (KLOR-CON) 10 MEQ tablet  Daily        06/01/20 1355           Placido Sou, PA-C 06/01/20 1401    Pricilla Loveless, MD 06/01/20 1459

## 2020-06-01 NOTE — ED Triage Notes (Signed)
Pt BIB [facility] from home for pressure ulcer on left buttock. Also has rash from thighs to waist. Son is primary caregiver. Pt has difficulty caring for self. Bilateral amputee. Hx dementia. Mild rhonchi, productive cough with yellow sputum. No other complaints.  BP 184/86 HR 80 SpO2 92% RA CBG 198

## 2020-06-02 ENCOUNTER — Telehealth: Payer: Self-pay | Admitting: Physician Assistant

## 2020-06-02 NOTE — Telephone Encounter (Signed)
Called to discuss with patient about COVID-19 symptoms and the use of one of the available treatments for those with mild to moderate Covid symptoms and at a high risk of hospitalization.  Pt appears to qualify for outpatient treatment due to co-morbid conditions and/or a member of an at-risk group in accordance with the FDA Emergency Use Authorization.    Symptom onset: unclear Vaccinated: n0 Booster? no Immunocompromised? no Qualifiers: age, BMI, CVD, lung dz, DM NIH Criteria: 1  Unable to reach pt - no VM to leave. Pt is very high risk and has dementia. See in ER for rash. Pt denied sx. Daughter and pt refused pax and bebtel given lack of symptoms.    Cline Crock

## 2020-06-05 ENCOUNTER — Other Ambulatory Visit: Payer: Self-pay | Admitting: Physician Assistant

## 2020-06-05 DIAGNOSIS — U071 COVID-19: Secondary | ICD-10-CM

## 2020-06-05 DIAGNOSIS — Z89612 Acquired absence of left leg above knee: Secondary | ICD-10-CM

## 2020-06-05 DIAGNOSIS — J411 Mucopurulent chronic bronchitis: Secondary | ICD-10-CM

## 2020-06-05 DIAGNOSIS — E1151 Type 2 diabetes mellitus with diabetic peripheral angiopathy without gangrene: Secondary | ICD-10-CM

## 2020-06-05 DIAGNOSIS — I739 Peripheral vascular disease, unspecified: Secondary | ICD-10-CM

## 2020-06-05 DIAGNOSIS — I70209 Unspecified atherosclerosis of native arteries of extremities, unspecified extremity: Secondary | ICD-10-CM

## 2020-06-05 DIAGNOSIS — Z89611 Acquired absence of right leg above knee: Secondary | ICD-10-CM

## 2020-06-05 MED ORDER — MOLNUPIRAVIR EUA 200MG CAPSULE: 4.0000 | ORAL_CAPSULE | Freq: Two times a day (BID) | ORAL | 0 refills | Status: DC

## 2020-06-05 NOTE — Progress Notes (Signed)
I connected by phone with Mark Russell on 06/05/2020 at 7:38 PM to discuss the potential use of a new treatment for mild to moderate COVID-19 viral infection in non-hospitalized patients.  This patient is a 85 y.o. male that meets the FDA criteria for Emergency Use Authorization of COVID monoclonal antibody bebtelovimab.  Has a (+) direct SARS-CoV-2 viral test result  Has mild or moderate COVID-19   Is NOT hospitalized due to COVID-19  Is within 10 days of symptom onset  Has at least one of the high risk factor(s) for progression to severe COVID-19 and/or hospitalization as defined in EUA.  Specific high risk criteria : Older age (>/= 83 yo), BMI > 25, Diabetes, Cardiovascular disease or hypertension and Other high risk medical condition per CDC:  unvaccinated, NIH tier 1   I have spoken and communicated the following to the patient or parent/caregiver regarding COVID monoclonal antibody treatment:  1. FDA has authorized the emergency use for the treatment of mild to moderate COVID-19 in adults and pediatric patients with positive results of direct SARS-CoV-2 viral testing who are 73 years of age and older weighing at least 40 kg, and who are at high risk for progressing to severe COVID-19 and/or hospitalization.  2. The significant known and potential risks and benefits of COVID monoclonal antibody, and the extent to which such potential risks and benefits are unknown.  3. Information on available alternative treatments and the risks and benefits of those alternatives, including clinical trials.  4. Patients treated with COVID monoclonal antibody should continue to self-isolate and use infection control measures (e.g., wear mask, isolate, social distance, avoid sharing personal items, clean and disinfect "high touch" surfaces, and frequent handwashing) according to CDC guidelines.   5. The patient or parent/caregiver has the option to accept or refuse COVID monoclonal antibody  treatment.  6. Discussion about the monoclonal antibody infusion does not ensure treatment. The patient will be placed on a list and scheduled according to risk, symptom onset and availability. A scheduler will reach to the patient to let them know if we can accommodate their infusion or not.  After reviewing this information with the patient, the patient has agreed to receive one of the available covid 19 monoclonal antibodies and will be provided an appropriate fact sheet prior to infusion. Cline Crock, PA-C 06/05/2020 7:38 PM

## 2020-06-05 NOTE — Progress Notes (Addendum)
Outpatient Oral COVID Treatment Note  I connected with Dema Severin on 06/05/2020/7:25 PM by telephone and verified that I am speaking with the correct person using two identifiers.  I discussed the limitations, risks, security, and privacy concerns of performing an evaluation and management service by telephone and the availability of in person appointments. I also discussed with the patient that there may be a patient responsible charge related to this service. The patient expressed understanding and agreed to proceed.  Patient location: home Provider location: office  Diagnosis: COVID-19 infection  Purpose of visit: Discussion of potential use of Molnupiravir or Paxlovid, a new treatment for mild to moderate COVID-19 viral infection in non-hospitalized patients.   Subjective: Patient is a 85 y.o. male who has been diagnosed with COVID 19 viral infection.  Their symptoms began on 5/28 with lethargy and weakness. Symptoms occurred after leaving the ER for evaluation of bedsores. Pt is unvaccinated.     Past Medical History:  Diagnosis Date  . Anxiety   . Avascular necrosis (HCC)   . BPH (benign prostatic hyperplasia)   . Depression   . Diabetes mellitus   . DVT (deep venous thrombosis) (HCC)   . GERD (gastroesophageal reflux disease)   . History of leg amputation (HCC)    both legs  . Hypertension   . Neuropathy   . Peripheral vascular disease (HCC)   . Sleep apnea     Allergies  Allergen Reactions  . Ace Inhibitors     hypotension  . Dilaudid [Hydromorphone Hcl]     "Mean"  . Metformin And Related     GI upset  . Morphine And Related Other (See Comments)    "mean"  . Pletal [Cilostazol]     SOB     Current Outpatient Medications:  .  albuterol (PROVENTIL HFA;VENTOLIN HFA) 108 (90 BASE) MCG/ACT inhaler, Inhale into the lungs every 6 (six) hours as needed for wheezing or shortness of breath., Disp: , Rfl:  .  AMITIZA 24 MCG capsule, Take 1 tablet by mouth 2 (two) times  daily., Disp: , Rfl:  .  arformoterol (BROVANA) 15 MCG/2ML NEBU, Take 2 mLs (15 mcg total) by nebulization 2 (two) times daily as needed., Disp: 120 mL, Rfl: 0 .  budesonide (PULMICORT) 0.5 MG/2ML nebulizer solution, Take 2 mLs (0.5 mg total) by nebulization 2 (two) times daily., Disp: 120 mL, Rfl: 0 .  doxycycline (VIBRA-TABS) 100 MG tablet, Take 1 tablet (100 mg total) by mouth 2 (two) times daily., Disp: 14 tablet, Rfl: 0 .  DULoxetine (CYMBALTA) 30 MG capsule, Take 1 capsule by mouth daily., Disp: , Rfl: 0 .  fentaNYL (DURAGESIC - DOSED MCG/HR) 25 MCG/HR patch, Place 1 patch onto the skin every other day., Disp: , Rfl: 0 .  FLUoxetine (PROZAC) 20 MG capsule, Take 20 mg by mouth daily., Disp: , Rfl:  .  gabapentin (NEURONTIN) 300 MG capsule, Take 3 capsules by mouth every 6 (six) hours., Disp: , Rfl:  .  glimepiride (AMARYL) 2 MG tablet, Take 2 mg by mouth daily before breakfast., Disp: , Rfl:  .  loratadine (CLARITIN) 10 MG tablet, Take 1 tablet (10 mg total) by mouth daily., Disp: 14 tablet, Rfl: 0 .  Menthol, Topical Analgesic, (MINERAL ICE) 2 % GEL, Apply 1 application topically 3 (three) times daily as needed (for extremity pain)., Disp: , Rfl:  .  omeprazole (PRILOSEC) 20 MG capsule, Take 20 mg by mouth daily. , Disp: , Rfl:  .  potassium chloride (KLOR-CON)  10 MEQ tablet, Take 1 tablet (10 mEq total) by mouth daily for 14 days., Disp: 14 tablet, Rfl: 0 .  simvastatin (ZOCOR) 40 MG tablet, Take 40 mg by mouth every evening., Disp: , Rfl:  .  traZODone (DESYREL) 150 MG tablet, Take 1 tablet by mouth daily., Disp: , Rfl:  .  warfarin (COUMADIN) 4 MG tablet, Take 2-4 mg by mouth as directed. Takes 1/2 tablet on Sunday Tuesday and Thursday and 1 whole tablet all other days, Disp: , Rfl:   Objective: Patient very lethargic..  They are in no apparent distress.  Breathing is non labored.  Mood and behavior are normal.  Laboratory Data:  Recent Results (from the past 2160 hour(s))  CBC with  Differential     Status: Abnormal   Collection Time: 06/01/20 10:58 AM  Result Value Ref Range   WBC 7.8 4.0 - 10.5 K/uL   RBC 5.66 4.22 - 5.81 MIL/uL   Hemoglobin 16.8 13.0 - 17.0 g/dL   HCT 88.2 (H) 80.0 - 34.9 %   MCV 93.1 80.0 - 100.0 fL   MCH 29.7 26.0 - 34.0 pg   MCHC 31.9 30.0 - 36.0 g/dL   RDW 17.9 15.0 - 56.9 %   Platelets 256 150 - 400 K/uL   nRBC 0.0 0.0 - 0.2 %   Neutrophils Relative % 83 %   Neutro Abs 6.4 1.7 - 7.7 K/uL   Lymphocytes Relative 6 %   Lymphs Abs 0.5 (L) 0.7 - 4.0 K/uL   Monocytes Relative 11 %   Monocytes Absolute 0.9 0.1 - 1.0 K/uL   Eosinophils Relative 0 %   Eosinophils Absolute 0.0 0.0 - 0.5 K/uL   Basophils Relative 0 %   Basophils Absolute 0.0 0.0 - 0.1 K/uL   Immature Granulocytes 0 %   Abs Immature Granulocytes 0.03 0.00 - 0.07 K/uL    Comment: Performed at [facility], [address], [place], [place] [address]  Comprehensive metabolic panel     Status: Abnormal   Collection Time: 06/01/20 10:58 AM  Result Value Ref Range   Sodium 138 135 - 145 mmol/L   Potassium 2.9 (L) 3.5 - 5.1 mmol/L   Chloride 93 (L) 98 - 111 mmol/L   CO2 31 22 - 32 mmol/L   Glucose, Bld 110 (H) 70 - 99 mg/dL    Comment: Glucose reference range applies only to samples taken after fasting for at least 8 hours.   BUN 13 8 - 23 mg/dL   Creatinine, Ser 1.65 0.61 - 1.24 mg/dL   Calcium 8.8 (L) 8.9 - 10.3 mg/dL   Total Protein 8.7 (H) 6.5 - 8.1 g/dL   Albumin 3.8 3.5 - 5.0 g/dL   AST 32 15 - 41 U/L   ALT 14 0 - 44 U/L   Alkaline Phosphatase 48 38 - 126 U/L   Total Bilirubin 0.4 0.3 - 1.2 mg/dL   GFR, Estimated >53 >74 mL/min    Comment: (NOTE) Calculated using the CKD-EPI Creatinine Equation (2021)    Anion gap 14 5 - 15    Comment: Performed at [facility], [address] [place], [place] [address]  Urinalysis, Routine w reflex microscopic Urine, Clean Catch     Status: Abnormal   Collection Time: 06/01/20 10:58 AM   Result Value Ref Range   Color, Urine AMBER (A) YELLOW    Comment: BIOCHEMICALS MAY BE AFFECTED BY COLOR   APPearance HAZY (A) CLEAR   Specific Gravity, Urine  1.023 1.005 - 1.030   pH 5.0 5.0 - 8.0   Glucose, UA NEGATIVE NEGATIVE mg/dL   Hgb urine dipstick MODERATE (A) NEGATIVE   Bilirubin Urine NEGATIVE NEGATIVE   Ketones, ur 5 (A) NEGATIVE mg/dL   Protein, ur 564 (A) NEGATIVE mg/dL   Nitrite NEGATIVE NEGATIVE   Leukocytes,Ua NEGATIVE NEGATIVE   RBC / HPF 0-5 0 - 5 RBC/hpf   WBC, UA 6-10 0 - 5 WBC/hpf   Bacteria, UA RARE (A) NONE SEEN   Squamous Epithelial / LPF 0-5 0 - 5   Mucus PRESENT    Hyaline Casts, UA PRESENT    Granular Casts, UA PRESENT     Comment: Performed at [facility], [address], [place], [place] [address]  Resp Panel by RT-PCR (Flu A&B, Covid) Nasopharyngeal Swab     Status: Abnormal   Collection Time: 06/01/20 10:58 AM   Specimen: Nasopharyngeal Swab; Nasopharyngeal(NP) swabs in vial transport medium  Result Value Ref Range   SARS Coronavirus 2 by RT PCR POSITIVE (A) NEGATIVE    Comment: RESULT CALLED TO, READ BACK BY AND VERIFIED WITH: SAVOIE,B. RN AT 1308 06/01/20 MULLINS,T (NOTE) SARS-CoV-2 target nucleic acids are DETECTED.  The SARS-CoV-2 RNA is generally detectable in upper respiratory specimens during the acute phase of infection. Positive results are indicative of the presence of the identified virus, but do not rule out bacterial infection or co-infection with other pathogens not detected by the test. Clinical correlation with patient history and other diagnostic information is necessary to determine patient infection status. The expected result is Negative.  Fact Sheet for Patients: BloggerCourse.com  Fact Sheet for Healthcare Providers: SeriousBroker.it  This test is not yet approved or cleared by the [place] FDA and  has been authorized for detection and/or  diagnosis of SARS-CoV-2 by FDA under an Emergency Use Authorization (EUA).  This EUA will remain in effect (meaning this test  can be used) for the duration of  the COVID-19 declaration under Section 564(b)(1) of the Act, 21 U.S.C. section 360bbb-3(b)(1), unless the authorization is terminated or revoked sooner.     Influenza A by PCR NEGATIVE NEGATIVE   Influenza B by PCR NEGATIVE NEGATIVE    Comment: (NOTE) The Xpert Xpress SARS-CoV-2/FLU/RSV plus assay is intended as an aid in the diagnosis of influenza from Nasopharyngeal swab specimens and should not be used as a sole basis for treatment. Nasal washings and aspirates are unacceptable for Xpert Xpress SARS-CoV-2/FLU/RSV testing.  Fact Sheet for Patients: BloggerCourse.com  Fact Sheet for Healthcare Providers: SeriousBroker.it  This test is not yet approved or cleared by the [place] FDA and has been authorized for detection and/or diagnosis of SARS-CoV-2 by FDA under an Emergency Use Authorization (EUA). This EUA will remain in effect (meaning this test can be used) for the duration of the COVID-19 declaration under Section 564(b)(1) of the Act, 21 U.S.C. section 360bbb-3(b)(1), unless the authorization is terminated or revoked.  Performed at [facility], [address] [place], [place] [address]   Magnesium     Status: None   Collection Time: 06/01/20 10:58 AM  Result Value Ref Range   Magnesium 2.4 1.7 - 2.4 mg/dL    Comment: Performed at [facility], [address] [place], [place] [address]     Assessment: 85 y.o. male with mild/moderate COVID 19 viral infection diagnosed on 5/28 at high risk for progression to severe COVID 19.  Plan:  This patient is a 85  y.o. male that meets the following criteria for Emergency Use Authorization of: Molnupiravir  1. Age >18 yr 2. SARS-COV-2 positive test 3. Symptom onset < 5  days 4. Mild-to-moderate COVID disease with high risk for severe progression to hospitalization or death   I have spoken and communicated the following to the patient or parent/caregiver regarding: 1. Molnupiravir is an unapproved drug that is authorized for use under an TEFL teacher.  2. There are no adequate, approved, available products for the treatment of COVID-19 in adults who have mild-to-moderate COVID-19 and are at high risk for progressing to severe COVID-19, including hospitalization or death. 3. Other therapeutics are currently authorized. For additional information on all products authorized for treatment or prevention of COVID-19, please see https://www.graham-miller.com/.  4. There are benefits and risks of taking this treatment as outlined in the "Fact Sheet for Patients and Caregivers."  5. "Fact Sheet for Patients and Caregivers" was reviewed with patient. A hard copy will be provided to patient from pharmacy prior to the patient receiving treatment. 6. Patients should continue to self-isolate and use infection control measures (e.g., wear mask, isolate, social distance, avoid sharing personal items, clean and disinfect "high touch" surfaces, and frequent handwashing) according to CDC guidelines.  7. The patient or parent/caregiver has the option to accept or refuse treatment. 8. [employer] has established a pregnancy surveillance program. 9. Females of childbearing potential should use a reliable method of contraception correctly and consistently, as applicable, for the duration of treatment and for 4 days after the last dose of Molnupiravir. 10. Males of reproductive potential who are sexually active with females of childbearing potential should use a reliable method of contraception correctly and consistently during treatment and for at least 3 months after the last  dose. 11. Pregnancy status and risk was assessed. Patient verbalized understanding of precautions.   After reviewing above information with the patient, the patient agrees to receive molnupiravir.  Follow up instructions:    . Take prescription BID x 5 days as directed . Reach out to pharmacist for counseling on medication if desired . For concerns regarding further COVID symptoms please follow up with your PCP or urgent care . For urgent or life-threatening issues, seek care at your local emergency department  The patient was provided an opportunity to ask questions, and all were answered. The patient agreed with the plan and demonstrated an understanding of the instructions.   Script sent to [facility] and opted to pick up RX.  The patient was advised to call their PCP or seek an in-person evaluation if the symptoms worsen or if the condition fails to improve as anticipated.   I provided 15 minutes of non face-to-face telephone visit time during this encounter, and > 50% was spent counseling as documented under my assessment & plan.   I did not feel comfortable sending in paxlovid given fentanyl patch and lethargy. Will also place orders for mab,   Cline Crock, PA-C 06/05/2020 /7:25 PM

## 2020-06-06 ENCOUNTER — Inpatient Hospital Stay (HOSPITAL_COMMUNITY)
Admission: EM | Admit: 2020-06-06 | Discharge: 2020-06-10 | DRG: 177 | Disposition: A | Discharge status: Home Health | Payer: Medicare Other | Attending: Internal Medicine | Admitting: Internal Medicine

## 2020-06-06 ENCOUNTER — Other Ambulatory Visit: Payer: Self-pay

## 2020-06-06 ENCOUNTER — Emergency Department (HOSPITAL_COMMUNITY): Payer: Medicare Other

## 2020-06-06 ENCOUNTER — Encounter (HOSPITAL_COMMUNITY): Payer: Self-pay

## 2020-06-06 DIAGNOSIS — I10 Essential (primary) hypertension: Secondary | ICD-10-CM | POA: Diagnosis not present

## 2020-06-06 DIAGNOSIS — E871 Hypo-osmolality and hyponatremia: Secondary | ICD-10-CM | POA: Diagnosis present

## 2020-06-06 DIAGNOSIS — I160 Hypertensive urgency: Secondary | ICD-10-CM | POA: Diagnosis not present

## 2020-06-06 DIAGNOSIS — Z743 Need for continuous supervision: Secondary | ICD-10-CM | POA: Diagnosis not present

## 2020-06-06 DIAGNOSIS — F32A Depression, unspecified: Secondary | ICD-10-CM | POA: Diagnosis present

## 2020-06-06 DIAGNOSIS — R410 Disorientation, unspecified: Secondary | ICD-10-CM | POA: Diagnosis not present

## 2020-06-06 DIAGNOSIS — U071 COVID-19: Principal | ICD-10-CM

## 2020-06-06 DIAGNOSIS — I517 Cardiomegaly: Secondary | ICD-10-CM | POA: Diagnosis not present

## 2020-06-06 DIAGNOSIS — E1169 Type 2 diabetes mellitus with other specified complication: Secondary | ICD-10-CM | POA: Diagnosis present

## 2020-06-06 DIAGNOSIS — R791 Abnormal coagulation profile: Secondary | ICD-10-CM | POA: Diagnosis present

## 2020-06-06 DIAGNOSIS — I70209 Unspecified atherosclerosis of native arteries of extremities, unspecified extremity: Secondary | ICD-10-CM

## 2020-06-06 DIAGNOSIS — E860 Dehydration: Secondary | ICD-10-CM | POA: Diagnosis present

## 2020-06-06 DIAGNOSIS — G4733 Obstructive sleep apnea (adult) (pediatric): Secondary | ICD-10-CM | POA: Diagnosis present

## 2020-06-06 DIAGNOSIS — G319 Degenerative disease of nervous system, unspecified: Secondary | ICD-10-CM | POA: Diagnosis not present

## 2020-06-06 DIAGNOSIS — J9601 Acute respiratory failure with hypoxia: Secondary | ICD-10-CM | POA: Diagnosis present

## 2020-06-06 DIAGNOSIS — Z28310 Unvaccinated for covid-19: Secondary | ICD-10-CM

## 2020-06-06 DIAGNOSIS — K219 Gastro-esophageal reflux disease without esophagitis: Secondary | ICD-10-CM | POA: Diagnosis not present

## 2020-06-06 DIAGNOSIS — Z86718 Personal history of other venous thrombosis and embolism: Secondary | ICD-10-CM | POA: Diagnosis not present

## 2020-06-06 DIAGNOSIS — J321 Chronic frontal sinusitis: Secondary | ICD-10-CM | POA: Diagnosis not present

## 2020-06-06 DIAGNOSIS — G894 Chronic pain syndrome: Secondary | ICD-10-CM | POA: Diagnosis present

## 2020-06-06 DIAGNOSIS — G9341 Metabolic encephalopathy: Secondary | ICD-10-CM | POA: Diagnosis not present

## 2020-06-06 DIAGNOSIS — J440 Chronic obstructive pulmonary disease with acute lower respiratory infection: Secondary | ICD-10-CM | POA: Diagnosis not present

## 2020-06-06 DIAGNOSIS — Z89611 Acquired absence of right leg above knee: Secondary | ICD-10-CM

## 2020-06-06 DIAGNOSIS — R0602 Shortness of breath: Secondary | ICD-10-CM | POA: Diagnosis not present

## 2020-06-06 DIAGNOSIS — I6782 Cerebral ischemia: Secondary | ICD-10-CM | POA: Diagnosis not present

## 2020-06-06 DIAGNOSIS — I82409 Acute embolism and thrombosis of unspecified deep veins of unspecified lower extremity: Secondary | ICD-10-CM | POA: Diagnosis present

## 2020-06-06 DIAGNOSIS — E872 Acidosis: Secondary | ICD-10-CM | POA: Diagnosis present

## 2020-06-06 DIAGNOSIS — I4510 Unspecified right bundle-branch block: Secondary | ICD-10-CM | POA: Diagnosis not present

## 2020-06-06 DIAGNOSIS — N400 Enlarged prostate without lower urinary tract symptoms: Secondary | ICD-10-CM | POA: Diagnosis present

## 2020-06-06 DIAGNOSIS — Z7984 Long term (current) use of oral hypoglycemic drugs: Secondary | ICD-10-CM

## 2020-06-06 DIAGNOSIS — Z8249 Family history of ischemic heart disease and other diseases of the circulatory system: Secondary | ICD-10-CM | POA: Diagnosis not present

## 2020-06-06 DIAGNOSIS — J411 Mucopurulent chronic bronchitis: Secondary | ICD-10-CM | POA: Diagnosis present

## 2020-06-06 DIAGNOSIS — F419 Anxiety disorder, unspecified: Secondary | ICD-10-CM | POA: Diagnosis present

## 2020-06-06 DIAGNOSIS — E1151 Type 2 diabetes mellitus with diabetic peripheral angiopathy without gangrene: Secondary | ICD-10-CM | POA: Diagnosis not present

## 2020-06-06 DIAGNOSIS — Z7901 Long term (current) use of anticoagulants: Secondary | ICD-10-CM

## 2020-06-06 DIAGNOSIS — Z89612 Acquired absence of left leg above knee: Secondary | ICD-10-CM

## 2020-06-06 DIAGNOSIS — I739 Peripheral vascular disease, unspecified: Secondary | ICD-10-CM

## 2020-06-06 DIAGNOSIS — J441 Chronic obstructive pulmonary disease with (acute) exacerbation: Secondary | ICD-10-CM | POA: Diagnosis present

## 2020-06-06 DIAGNOSIS — R069 Unspecified abnormalities of breathing: Secondary | ICD-10-CM | POA: Diagnosis not present

## 2020-06-06 DIAGNOSIS — Z7951 Long term (current) use of inhaled steroids: Secondary | ICD-10-CM

## 2020-06-06 DIAGNOSIS — Z87891 Personal history of nicotine dependence: Secondary | ICD-10-CM

## 2020-06-06 DIAGNOSIS — J9600 Acute respiratory failure, unspecified whether with hypoxia or hypercapnia: Secondary | ICD-10-CM | POA: Diagnosis present

## 2020-06-06 DIAGNOSIS — J1282 Pneumonia due to coronavirus disease 2019: Secondary | ICD-10-CM | POA: Diagnosis present

## 2020-06-06 DIAGNOSIS — Z888 Allergy status to other drugs, medicaments and biological substances status: Secondary | ICD-10-CM

## 2020-06-06 DIAGNOSIS — Z79899 Other long term (current) drug therapy: Secondary | ICD-10-CM

## 2020-06-06 DIAGNOSIS — Z885 Allergy status to narcotic agent status: Secondary | ICD-10-CM

## 2020-06-06 DIAGNOSIS — R6889 Other general symptoms and signs: Secondary | ICD-10-CM | POA: Diagnosis not present

## 2020-06-06 LAB — URINALYSIS, ROUTINE W REFLEX MICROSCOPIC
Bilirubin Urine: NEGATIVE
Glucose, UA: NEGATIVE mg/dL
Ketones, ur: 5 mg/dL — AB
Leukocytes,Ua: NEGATIVE
Nitrite: NEGATIVE
Protein, ur: >=300 mg/dL — AB
Specific Gravity, Urine: 1.04 — ABNORMAL HIGH (ref 1.005–1.030)
pH: 5 (ref 5.0–8.0)

## 2020-06-06 LAB — COMPREHENSIVE METABOLIC PANEL
ALT: 17 U/L (ref 0–44)
AST: 36 U/L (ref 15–41)
Albumin: 3.2 g/dL — ABNORMAL LOW (ref 3.5–5.0)
Alkaline Phosphatase: 51 U/L (ref 38–126)
Anion gap: 11 (ref 5–15)
BUN: 9 mg/dL (ref 8–23)
CO2: 27 mmol/L (ref 22–32)
Calcium: 8.3 mg/dL — ABNORMAL LOW (ref 8.9–10.3)
Chloride: 95 mmol/L — ABNORMAL LOW (ref 98–111)
Creatinine, Ser: 0.55 mg/dL — ABNORMAL LOW (ref 0.61–1.24)
GFR, Estimated: >60 mL/min (ref >60)
Glucose, Bld: 155 mg/dL — ABNORMAL HIGH (ref 70–99)
Potassium: 4.3 mmol/L (ref 3.5–5.1)
Sodium: 133 mmol/L — ABNORMAL LOW (ref 135–145)
Total Bilirubin: 1 mg/dL (ref 0.3–1.2)
Total Protein: 8.1 g/dL (ref 6.5–8.1)

## 2020-06-06 LAB — BLOOD GAS, VENOUS
Acid-Base Excess: 3.9 mmol/L — ABNORMAL HIGH (ref 0.0–2.0)
Bicarbonate: 28.2 mmol/L — ABNORMAL HIGH (ref 20.0–28.0)
FIO2: 21
O2 Saturation: 65.1 %
Patient temperature: 98.6
pCO2, Ven: 42.7 mmHg — ABNORMAL LOW (ref 44.0–60.0)
pH, Ven: 7.435 — ABNORMAL HIGH (ref 7.250–7.430)
pO2, Ven: 35.9 mmHg (ref 32.0–45.0)

## 2020-06-06 LAB — D-DIMER, QUANTITATIVE: D-Dimer, Quant: 0.98 ug/mL-FEU — ABNORMAL HIGH (ref 0.00–0.50)

## 2020-06-06 LAB — CBC WITH DIFFERENTIAL/PLATELET
Abs Immature Granulocytes: 0.11 10*3/uL — ABNORMAL HIGH (ref 0.00–0.07)
Basophils Absolute: 0 10*3/uL (ref 0.0–0.1)
Basophils Relative: 0 %
Eosinophils Absolute: 0 10*3/uL (ref 0.0–0.5)
Eosinophils Relative: 0 %
HCT: 49.8 % (ref 39.0–52.0)
Hemoglobin: 15.6 g/dL (ref 13.0–17.0)
Immature Granulocytes: 1 %
Lymphocytes Relative: 5 %
Lymphs Abs: 0.6 10*3/uL — ABNORMAL LOW (ref 0.7–4.0)
MCH: 29.2 pg (ref 26.0–34.0)
MCHC: 31.3 g/dL (ref 30.0–36.0)
MCV: 93.3 fL (ref 80.0–100.0)
Monocytes Absolute: 1.3 10*3/uL — ABNORMAL HIGH (ref 0.1–1.0)
Monocytes Relative: 10 %
Neutro Abs: 10 10*3/uL — ABNORMAL HIGH (ref 1.7–7.7)
Neutrophils Relative %: 84 %
Platelets: 285 10*3/uL (ref 150–400)
RBC: 5.34 MIL/uL (ref 4.22–5.81)
RDW: 15.3 % (ref 11.5–15.5)
WBC: 12 10*3/uL — ABNORMAL HIGH (ref 4.0–10.5)
nRBC: 0 % (ref 0.0–0.2)

## 2020-06-06 LAB — RESP PANEL BY RT-PCR (FLU A&B, COVID) ARPGX2
Influenza A by PCR: NEGATIVE
Influenza B by PCR: NEGATIVE
SARS Coronavirus 2 by RT PCR: POSITIVE — AB

## 2020-06-06 LAB — LACTIC ACID, PLASMA
Lactic Acid, Venous: 2.5 mmol/L (ref 0.5–1.9)
Lactic Acid, Venous: 1.8 mmol/L (ref 0.5–1.9)

## 2020-06-06 LAB — TRIGLYCERIDES: Triglycerides: 83 mg/dL (ref <150)

## 2020-06-06 LAB — C-REACTIVE PROTEIN: CRP: 23.3 mg/dL — ABNORMAL HIGH (ref <1.0)

## 2020-06-06 LAB — GLUCOSE, CAPILLARY
Glucose-Capillary: 218 mg/dL — ABNORMAL HIGH (ref 70–99)
Glucose-Capillary: 276 mg/dL — ABNORMAL HIGH (ref 70–99)
Glucose-Capillary: 155 mg/dL — ABNORMAL HIGH (ref 70–99)

## 2020-06-06 LAB — PROCALCITONIN: Procalcitonin: <0.1 ng/mL

## 2020-06-06 LAB — FIBRINOGEN: Fibrinogen: >800 mg/dL — ABNORMAL HIGH (ref 210–475)

## 2020-06-06 LAB — FERRITIN: Ferritin: 183 ng/mL (ref 24–336)

## 2020-06-06 LAB — PROTIME-INR
INR: 8.1 (ref 0.8–1.2)
Prothrombin Time: 67.6 seconds — ABNORMAL HIGH (ref 11.4–15.2)

## 2020-06-06 LAB — CBG MONITORING, ED: Glucose-Capillary: 200 mg/dL — ABNORMAL HIGH (ref 70–99)

## 2020-06-06 LAB — LACTATE DEHYDROGENASE: LDH: 207 U/L — ABNORMAL HIGH (ref 98–192)

## 2020-06-06 MED ORDER — HYDROCORTISONE 1 % EX CREA
TOPICAL_CREAM | Freq: Two times a day (BID) | CUTANEOUS | Status: DC | PRN
  Filled 2020-06-06: qty 28

## 2020-06-06 MED ORDER — EPINEPHRINE 0.3 MG/0.3ML IJ SOAJ: 0.3000 mg | Freq: Once | INTRAMUSCULAR | Status: DC | PRN

## 2020-06-06 MED ORDER — GABAPENTIN 300 MG PO CAPS
900.0000 mg | ORAL_CAPSULE | Freq: Three times a day (TID) | ORAL | Status: DC
  Administered 2020-06-06 (×2): 900 mg via ORAL
  Filled 2020-06-06 (×2): qty 3

## 2020-06-06 MED ORDER — BEBTELOVIMAB 175 MG/2 ML IV (EUA): 175.0000 mg | Freq: Once | INTRAMUSCULAR | Status: DC

## 2020-06-06 MED ORDER — INSULIN DETEMIR 100 UNIT/ML ~~LOC~~ SOLN
0.1000 [IU]/kg | Freq: Two times a day (BID) | SUBCUTANEOUS | Status: DC
  Administered 2020-06-06 – 2020-06-10 (×8): 9 [IU] via SUBCUTANEOUS
  Filled 2020-06-06 (×8): qty 0.09

## 2020-06-06 MED ORDER — ZINC OXIDE 40 % EX OINT
TOPICAL_OINTMENT | Freq: Three times a day (TID) | CUTANEOUS | Status: DC | PRN
  Filled 2020-06-06: qty 57

## 2020-06-06 MED ORDER — PREDNISONE 50 MG PO TABS: 50.0000 mg | ORAL_TABLET | Freq: Every day | ORAL | Status: DC

## 2020-06-06 MED ORDER — IPRATROPIUM-ALBUTEROL 20-100 MCG/ACT IN AERS
1.0000 | INHALATION_SPRAY | Freq: Four times a day (QID) | RESPIRATORY_TRACT | Status: DC
  Administered 2020-06-06 – 2020-06-10 (×15): 1 via RESPIRATORY_TRACT
  Filled 2020-06-06 (×2): qty 4

## 2020-06-06 MED ORDER — LINAGLIPTIN 5 MG PO TABS
5.0000 mg | ORAL_TABLET | Freq: Every day | ORAL | Status: DC
  Administered 2020-06-06 – 2020-06-10 (×5): 5 mg via ORAL
  Filled 2020-06-06 (×5): qty 1

## 2020-06-06 MED ORDER — ORAL CARE MOUTH RINSE
15.0000 mL | Freq: Two times a day (BID) | OROMUCOSAL | Status: DC
  Administered 2020-06-06 – 2020-06-10 (×6): 15 mL via OROMUCOSAL

## 2020-06-06 MED ORDER — DIPHENHYDRAMINE HCL 50 MG/ML IJ SOLN: 50.0000 mg | Freq: Once | INTRAMUSCULAR | Status: DC | PRN

## 2020-06-06 MED ORDER — SODIUM CHLORIDE 0.9 % IV SOLN
200.0000 mg | Freq: Once | INTRAVENOUS | Status: AC
  Administered 2020-06-06: 200 mg via INTRAVENOUS
  Filled 2020-06-06: qty 40

## 2020-06-06 MED ORDER — DULOXETINE HCL 30 MG PO CPEP
30.0000 mg | ORAL_CAPSULE | Freq: Every day | ORAL | Status: DC
  Administered 2020-06-06 – 2020-06-10 (×5): 30 mg via ORAL
  Filled 2020-06-06 (×5): qty 1

## 2020-06-06 MED ORDER — HYDRALAZINE HCL 20 MG/ML IJ SOLN
10.0000 mg | Freq: Three times a day (TID) | INTRAMUSCULAR | Status: DC | PRN
  Administered 2020-06-06: 10 mg via INTRAVENOUS
  Filled 2020-06-06: qty 1

## 2020-06-06 MED ORDER — METHYLPREDNISOLONE SODIUM SUCC 40 MG IJ SOLR
40.0000 mg | Freq: Two times a day (BID) | INTRAMUSCULAR | Status: DC
  Administered 2020-06-06 – 2020-06-08 (×3): 40 mg via INTRAVENOUS
  Filled 2020-06-06 (×5): qty 1

## 2020-06-06 MED ORDER — INSULIN ASPART 100 UNIT/ML IJ SOLN
0.0000 [IU] | Freq: Every day | INTRAMUSCULAR | Status: DC
  Administered 2020-06-09: 2 [IU] via SUBCUTANEOUS
  Filled 2020-06-06: qty 0.05

## 2020-06-06 MED ORDER — GUAIFENESIN-DM 100-10 MG/5ML PO SYRP
10.0000 mL | ORAL_SOLUTION | ORAL | Status: DC | PRN
  Administered 2020-06-10: 10 mL via ORAL
  Filled 2020-06-06: qty 10

## 2020-06-06 MED ORDER — SODIUM CHLORIDE 0.9% FLUSH
3.0000 mL | Freq: Two times a day (BID) | INTRAVENOUS | Status: DC
  Administered 2020-06-06 – 2020-06-10 (×7): 3 mL via INTRAVENOUS

## 2020-06-06 MED ORDER — MOMETASONE FURO-FORMOTEROL FUM 200-5 MCG/ACT IN AERO
2.0000 | INHALATION_SPRAY | Freq: Two times a day (BID) | RESPIRATORY_TRACT | Status: DC
  Administered 2020-06-06 – 2020-06-10 (×9): 2 via RESPIRATORY_TRACT
  Filled 2020-06-06 (×2): qty 8.8

## 2020-06-06 MED ORDER — SODIUM CHLORIDE 0.9 % IV SOLN: INTRAVENOUS | Status: DC | PRN

## 2020-06-06 MED ORDER — SODIUM CHLORIDE 0.9 % IV SOLN: 100.0000 mg | Freq: Every day | INTRAVENOUS | Status: DC

## 2020-06-06 MED ORDER — INSULIN ASPART 100 UNIT/ML IJ SOLN
0.0000 [IU] | Freq: Three times a day (TID) | INTRAMUSCULAR | Status: DC
  Administered 2020-06-06: 8 [IU] via SUBCUTANEOUS
  Administered 2020-06-06: 5 [IU] via SUBCUTANEOUS
  Administered 2020-06-07 (×3): 3 [IU] via SUBCUTANEOUS
  Administered 2020-06-08: 5 [IU] via SUBCUTANEOUS
  Administered 2020-06-08: 3 [IU] via SUBCUTANEOUS
  Administered 2020-06-09: 2 [IU] via SUBCUTANEOUS
  Administered 2020-06-09 – 2020-06-10 (×2): 3 [IU] via SUBCUTANEOUS
  Filled 2020-06-06: qty 0.15

## 2020-06-06 MED ORDER — PHYTONADIONE 5 MG PO TABS
2.5000 mg | ORAL_TABLET | Freq: Once | ORAL | Status: AC
  Administered 2020-06-06: 2.5 mg via ORAL
  Filled 2020-06-06 (×2): qty 1

## 2020-06-06 MED ORDER — FENTANYL 25 MCG/HR TD PT72
1.0000 | MEDICATED_PATCH | TRANSDERMAL | Status: DC
  Administered 2020-06-07 – 2020-06-10 (×2): 1 via TRANSDERMAL
  Filled 2020-06-06 (×2): qty 1

## 2020-06-06 MED ORDER — FAMOTIDINE IN NACL 20-0.9 MG/50ML-% IV SOLN: 20.0000 mg | Freq: Once | INTRAVENOUS | Status: DC | PRN

## 2020-06-06 MED ORDER — BARICITINIB 2 MG PO TABS
4.0000 mg | ORAL_TABLET | Freq: Every day | ORAL | Status: DC
  Administered 2020-06-06 – 2020-06-10 (×5): 4 mg via ORAL
  Filled 2020-06-06 (×5): qty 2

## 2020-06-06 MED ORDER — SODIUM CHLORIDE 0.9 % IV SOLN: 200.0000 mg | Freq: Once | INTRAVENOUS | Status: DC

## 2020-06-06 MED ORDER — DONEPEZIL HCL 10 MG PO TABS
10.0000 mg | ORAL_TABLET | Freq: Every day | ORAL | Status: DC
  Administered 2020-06-06 – 2020-06-09 (×4): 10 mg via ORAL
  Filled 2020-06-06 (×5): qty 1

## 2020-06-06 MED ORDER — METHYLPREDNISOLONE SODIUM SUCC 500 MG IJ SOLR
0.5000 mg/kg | Freq: Two times a day (BID) | INTRAMUSCULAR | Status: DC
  Filled 2020-06-06: qty 44.8

## 2020-06-06 MED ORDER — SIMVASTATIN 40 MG PO TABS
40.0000 mg | ORAL_TABLET | Freq: Every evening | ORAL | Status: DC
  Administered 2020-06-06 – 2020-06-09 (×4): 40 mg via ORAL
  Filled 2020-06-06 (×4): qty 1

## 2020-06-06 MED ORDER — OXYCODONE-ACETAMINOPHEN 7.5-325 MG PO TABS
1.0000 | ORAL_TABLET | Freq: Four times a day (QID) | ORAL | Status: DC | PRN
Start: 2020-06-06 — End: 2020-06-10
  Administered 2020-06-08 – 2020-06-10 (×4): 1 via ORAL
  Filled 2020-06-06 (×4): qty 1

## 2020-06-06 MED ORDER — SODIUM CHLORIDE 0.9 % IV SOLN
100.0000 mg | INTRAVENOUS | Status: AC
  Administered 2020-06-07 – 2020-06-10 (×4): 100 mg via INTRAVENOUS
  Filled 2020-06-06 (×4): qty 20

## 2020-06-06 MED ORDER — METHYLPREDNISOLONE SODIUM SUCC 125 MG IJ SOLR
125.0000 mg | Freq: Once | INTRAMUSCULAR | Status: AC
  Administered 2020-06-06: 125 mg via INTRAVENOUS
  Filled 2020-06-06: qty 2

## 2020-06-06 MED ORDER — ALBUTEROL SULFATE HFA 108 (90 BASE) MCG/ACT IN AERS: 2.0000 | INHALATION_SPRAY | Freq: Once | RESPIRATORY_TRACT | Status: DC | PRN

## 2020-06-06 MED ORDER — METHYLPREDNISOLONE SODIUM SUCC 125 MG IJ SOLR: 125.0000 mg | Freq: Once | INTRAMUSCULAR | Status: DC | PRN

## 2020-06-06 MED ORDER — TAMSULOSIN HCL 0.4 MG PO CAPS
0.4000 mg | ORAL_CAPSULE | Freq: Every day | ORAL | Status: DC
  Administered 2020-06-06 – 2020-06-10 (×5): 0.4 mg via ORAL
  Filled 2020-06-06 (×5): qty 1

## 2020-06-06 MED ORDER — ACETAMINOPHEN 325 MG PO TABS: 650.0000 mg | ORAL_TABLET | Freq: Four times a day (QID) | ORAL | Status: DC | PRN

## 2020-06-06 MED ORDER — METHYLPREDNISOLONE SODIUM SUCC 500 MG IJ SOLR: 0.5000 mg/kg | Freq: Two times a day (BID) | INTRAMUSCULAR | Status: DC

## 2020-06-06 NOTE — ED Notes (Signed)
Pt to CT at this time.

## 2020-06-06 NOTE — ED Notes (Signed)
This nurse entered the pt's room to find the pt had pulled off all cardiac leads, nasal cannula oxygen and his gown. This nurse re-attached pt to cardiac monitor x3, O2 nasal cannula at 4L and put new gown on pt. Pt was re-counseled on the importance of keeping his monitor leads on as well as his oxygen. Hospitalist notified.

## 2020-06-06 NOTE — ED Notes (Signed)
RT at the bedside for ABG.  

## 2020-06-06 NOTE — ED Notes (Signed)
Pt has been counseled multiple times by this nurse and hospitalist regarding keeping his O2 nasal cannula in his nose as well as keeping his mask on his face. Pt continues to be non-compliant with both.

## 2020-06-06 NOTE — ED Notes (Signed)
This nurse entered the pt's room to find the pt had removed cardiac monitor cords, nasal cannula oxygen, mask and gown. Discussed with hospitalist. Per hospitalist's verbal order, mitts placed on pt. Pt readjusted in bed with ED tech assisting. Pt re-attached to cardiac monitor x3. Placed on 4L [place] O2 and re-dressed in gown.

## 2020-06-06 NOTE — ED Notes (Signed)
Lactic of 2.5. Received from lab. PA-C notified and aware. No additional orders at this time. Will continue to monitor.

## 2020-06-06 NOTE — ED Notes (Signed)
Xray at the bedside.

## 2020-06-06 NOTE — ED Notes (Signed)
Hospitalist at the bedside 

## 2020-06-06 NOTE — Plan of Care (Signed)
Patient maintains oxygen saturation in the mid 90's on 3 liters.  Spoke to son Mark Russell over phone with update.

## 2020-06-06 NOTE — ED Notes (Signed)
Pt readjusted in ED stretcher and linens changed. Condom catheter placed. Pt remains awake and alert and oriented to person, place and situation. Pt remains attached to cardiac monitor x3 and [place] O2 at 3.0L since arrival to ED.

## 2020-06-06 NOTE — Progress Notes (Signed)
ANTICOAGULATION CONSULT NOTE   Pharmacy Consult for warfarin Indication:hx DVT  Allergies  Allergen Reactions  . Ace Inhibitors     hypotension  . Dilaudid [Hydromorphone Hcl]     "Mean"  . Metformin And Related     GI upset  . Morphine And Related Other (See Comments)    "mean"  . Pletal [Cilostazol]     SOB    Vital Signs: Temp: 98.7 F (37.1 C) (06/02 0718) Temp Source: Oral (06/02 0718) BP: 210/85 (06/02 1035) Pulse Rate: 83 (06/02 1035)  Labs: Recent Labs    06/06/20 0736  HGB 15.6  HCT 49.8  PLT 285  LABPROT 67.6*  INR 8.1*  CREATININE 0.55*    CrCl cannot be calculated (Unknown ideal weight.).   Medical History: Past Medical History:  Diagnosis Date  . Anxiety   . Avascular necrosis (HCC)   . BPH (benign prostatic hyperplasia)   . Depression   . Diabetes mellitus   . DVT (deep venous thrombosis) (HCC)   . GERD (gastroesophageal reflux disease)   . History of leg amputation (HCC)    both legs  . Hypertension   . Neuropathy   . Peripheral vascular disease (HCC)   . Sleep apnea     Medications:  - PTA warfarin regimen: 4mg  daily except 2mg  on MWF (last dose taken on 6/1)  Assessment: Patient is an 85 y.o M with hx DVT on warfarin PTA, who tested positive for COVID on 5/28, presented to the ED on 6/2 with c/o SOB.  Pharmacy has been consulted to dose warfarin.  Today, 06/06/2020: - INR is supra-therapeutic at 8.1 - cbc ok. No bleeding documented  Goal of Therapy:  INR 2-3 Monitor platelets by anticoagulation protocol: Yes   Plan:  - hold warfarin for now d/t supra-therapeutic INR - daily INR. Will resume warfarin if/when appropriate - monitor for s/sx bleeding  8/2 P 06/06/2020,11:30 AM

## 2020-06-06 NOTE — ED Notes (Signed)
Report sent to inpatient nurse.

## 2020-06-06 NOTE — H&P (Signed)
History and Physical        Hospital Admission Note Date: 06/06/2020  Patient name: Mark Russell Medical record number: [id number] Date of birth: 1935/01/06 Age: 85 y.o. Gender: male  PCP: Maurice Small, MD  Patient coming from: Home Lives with: Son   Chief Complaint    Chief Complaint  Patient presents with  . Shortness of Breath    Covid +      HPI:   History obtained from prior notes and family over the phone as patient is a poor historian  This is an 85 year old male who is not vaccinated against COVID-19 with a past medical history of OSA, COPD, type 2 diabetes, peripheral vascular disease s/p AKA bilaterally, avascular necrosis, DVT on warfarin, hypertension with recent COVID-19 diagnosis on 5/28 who presented to the ED with altered mental status and hypoxia for several days.  Patient was recently in the ED on 5/28 due to rash on his left buttock which was concerning for dermatitis and stage I left buttock pressure ulcer.  At the time family noted the patient to have increasing level of difficulty with ADLs as of lately.  Also on 5/28 he had an incidental COVID-19 diagnosis.  At the time he was offered a prescription for Paxlovid vs IV Bebtelovimab in the ED but family declined due to his lack of symptoms.  After he left the ED, he began having symptoms of lethargy and weakness on 5/28 and has not left his bed since and was prescribed Molnupiravir on 6/1.  Upon EMS arrival O2 was 88% on room air and placed on 3 L/min.  He does not have home O2 requirements.   ED Course: Afebrile, tachypneic,  hypertensive, placed on 3 L/min. Notable Labs: Sodium 133, K4.3, BUN 9, creatinine 0.55, LDH 207, CRP 23, lactic acid 2.5, procalcitonin negative, WBC 12.0, Hb 15.6, platelets 285, INR 8.1, D-dimer 0.98, fibrinogen >800, COVID-19 positive. Notable Imaging: CXR-new mild mid and lower  lung opacities worse on the right.  CT head- multiple chronic issues, no acute process other than sinusitis. Patient received Solu-Medrol, remdesivir.    Vitals:   06/06/20 0953 06/06/20 1035  BP: (!) 185/69 (!) 210/85  Pulse: 79 83  Resp: (!) 23 (!) 23  Temp:    SpO2: 93% 95%     Review of Systems:  Review of Systems  All other systems reviewed and are negative.   Medical/Social/Family History   Past Medical History: Past Medical History:  Diagnosis Date  . Anxiety   . Avascular necrosis (HCC)   . BPH (benign prostatic hyperplasia)   . Depression   . Diabetes mellitus   . DVT (deep venous thrombosis) (HCC)   . GERD (gastroesophageal reflux disease)   . History of leg amputation (HCC)    both legs  . Hypertension   . Neuropathy   . Peripheral vascular disease (HCC)   . Sleep apnea     Past Surgical History:  Procedure Laterality Date  . ABDOMINAL AORTIC ANEURYSM REPAIR  1988  . BACK SURGERY  1988, 1989  . BALLOON DILATION N/A 04/27/2012   Procedure: BALLOON DILATION;  Surgeon: Shirley Friar, MD;  Location: [facility];  Service: Endoscopy;  Laterality: N/A;  no xray  . BYPASS GRAFT    . ESOPHAGOGASTRODUODENOSCOPY N/A 04/27/2012   Procedure: ESOPHAGOGASTRODUODENOSCOPY (EGD);  Surgeon: Shirley Friar, MD;  Location: [facility];  Service: Endoscopy;  Laterality: N/A;  . LEG AMPUTATION THROUGH KNEE  06/2004    Medications: Prior to Admission medications   Medication Sig Start Date End Date Taking? Authorizing Provider  albuterol (PROVENTIL HFA;VENTOLIN HFA) 108 (90 BASE) MCG/ACT inhaler Inhale into the lungs every 6 (six) hours as needed for wheezing or shortness of breath.    [provider]  AMITIZA 24 MCG capsule Take 1 tablet by mouth 2 (two) times daily. 07/15/15   [provider]  arformoterol (BROVANA) 15 MCG/2ML NEBU Take 2 mLs (15 mcg total) by nebulization 2 (two) times daily as needed. 08/08/15   Bevelyn Ngo, NP  budesonide  (PULMICORT) 0.5 MG/2ML nebulizer solution Take 2 mLs (0.5 mg total) by nebulization 2 (two) times daily. 08/08/15   Bevelyn Ngo, NP  donepezil (ARICEPT) 10 MG tablet Take 10 mg by mouth at bedtime. 04/08/20   [provider]  doxycycline (VIBRA-TABS) 100 MG tablet Take 1 tablet (100 mg total) by mouth 2 (two) times daily. 08/09/15   de Dios, [place] A, MD  DULoxetine (CYMBALTA) 30 MG capsule Take 1 capsule by mouth daily. 07/15/15   [provider]  fentaNYL (DURAGESIC - DOSED MCG/HR) 25 MCG/HR patch Place 1 patch onto the skin every other day. 07/15/15   [provider]  FLUoxetine (PROZAC) 20 MG capsule Take 20 mg by mouth daily.    [provider]  gabapentin (NEURONTIN) 300 MG capsule Take 3 capsules by mouth every 6 (six) hours. 06/07/15   [provider]  glimepiride (AMARYL) 2 MG tablet Take 2 mg by mouth daily before breakfast.    [provider]  glimepiride (AMARYL) 4 MG tablet Take 4 mg by mouth daily. 05/24/20   [provider]  loratadine (CLARITIN) 10 MG tablet Take 1 tablet (10 mg total) by mouth daily. 03/30/12   Meredeth Ide, MD  Menthol, Topical Analgesic, (MINERAL ICE) 2 % GEL Apply 1 application topically 3 (three) times daily as needed (for extremity pain).    [provider]  molnupiravir EUA 200 mg CAPS Take 4 capsules (800 mg total) by mouth 2 (two) times daily for 5 days. 06/05/20 06/10/20  Janetta Hora, PA-C  omeprazole (PRILOSEC) 20 MG capsule Take 20 mg by mouth daily.     [provider]  PERCOCET 7.5-325 MG tablet Take 1 tablet by mouth every 6 (six) hours as needed for pain. 05/22/20   [provider]  potassium chloride (KLOR-CON) 10 MEQ tablet Take 1 tablet (10 mEq total) by mouth daily for 14 days. 06/01/20 06/15/20  Placido Sou, PA-C  simvastatin (ZOCOR) 40 MG tablet Take 40 mg by mouth every evening.    [provider]  tamsulosin (FLOMAX) 0.4 MG CAPS capsule Take  0.4 mg by mouth daily. 05/24/20   [provider]  traZODone (DESYREL) 150 MG tablet Take 1 tablet by mouth daily. 06/07/15   [provider]  warfarin (COUMADIN) 4 MG tablet Take 2-4 mg by mouth as directed. Takes 1/2 tablet on Sunday Tuesday and Thursday and 1 whole tablet all other days    [provider]    Allergies:   Allergies  Allergen Reactions  . Ace Inhibitors     hypotension  . Dilaudid [Hydromorphone Hcl]     "Mean"  .  Metformin And Related     GI upset  . Morphine And Related Other (See Comments)    "mean"  . Pletal [Cilostazol]     SOB    Social History:  reports that he quit smoking about 28 years ago. His smoking use included cigarettes. He has a 7.50 pack-year smoking history. He has never used smokeless tobacco. He reports that he does not drink alcohol. No history on file for drug use.  Family History: Family History  Problem Relation Age of Onset  . Heart attack Other   . Cancer Other   . Cancer Other   . Heart attack Other   . Esophageal cancer Daughter        Living, 6152     Objective   Physical Exam: Blood pressure (!) 210/85, pulse 83, temperature 98.7 F (37.1 C), temperature source Oral, resp. rate (!) 23, SpO2 95 %.  Physical Exam Vitals and nursing note reviewed.  Constitutional:      Appearance: Normal appearance.  HENT:     Head: Normocephalic and atraumatic.  Eyes:     Conjunctiva/sclera: Conjunctivae normal.  Cardiovascular:     Rate and Rhythm: Normal rate.     Pulses: Normal pulses.  Pulmonary:     Comments: Satting 86% on room air which improved to low 90s on 3 L/min nasal cannula Audible wheeze and rhonchorous breath sounds Abdominal:     General: Abdomen is flat.     Palpations: Abdomen is soft.  Musculoskeletal:     Comments: Bilateral lower extremity amputations  Skin:    Coloration: Skin is not jaundiced or pale.  Neurological:     Mental Status: He is alert.     Comments: Oriented x1 to  place  Psychiatric:        Mood and Affect: Mood normal.        Behavior: Behavior normal.     LABS on Admission: I have personally reviewed all the labs and imaging below    Basic Metabolic Panel: Recent Labs  Lab 06/01/20 1058 06/06/20 0736  NA 138 133*  K 2.9* 4.3  CL 93* 95*  CO2 31 27  GLUCOSE 110* 155*  BUN 13 9  CREATININE 0.86 0.55*  CALCIUM 8.8* 8.3*  MG 2.4  --    Liver Function Tests: Recent Labs  Lab 06/01/20 1058 06/06/20 0736  AST 32 36  ALT 14 17  ALKPHOS 48 51  BILITOT 0.4 1.0  PROT 8.7* 8.1  ALBUMIN 3.8 3.2*   No results for input(s): LIPASE, AMYLASE in the last 168 hours. No results for input(s): AMMONIA in the last 168 hours. CBC: Recent Labs  Lab 06/01/20 1058 06/06/20 0736  WBC 7.8 12.0*  NEUTROABS 6.4 10.0*  HGB 16.8 15.6  HCT 52.7* 49.8  MCV 93.1 93.3  PLT 256 285   Cardiac Enzymes: No results for input(s): CKTOTAL, CKMB, CKMBINDEX, TROPONINI in the last 168 hours. BNP: Invalid input(s): POCBNP CBG: No results for input(s): GLUCAP in the last 168 hours.  Radiological Exams on Admission:  CT Head Wo Contrast  Result Date: 06/06/2020 CLINICAL DATA:  Delirium. EXAM: CT HEAD WITHOUT CONTRAST TECHNIQUE: Contiguous axial images were obtained from the base of the skull through the vertex without intravenous contrast. COMPARISON:  None. FINDINGS: Motion limited study.  Within this limitation: Brain: No evidence of acute large vascular territory infarction, hemorrhage, hydrocephalus, extra-axial collection or mass effect. Multiple areas of ossification along the left convexity, measuring up to 5 mm in thickness  convexity (series 2, image 53). Moderate generalized cerebral volume loss. Mild patchy white matter hypoattenuation, most likely related to chronic microvascular ischemic disease. Vascular: No hyperdense vessel identified. Calcific intracranial atherosclerosis. Skull: No acute fracture. Sinuses/Orbits: Marked ethmoid air cell mucosal  thickening with frothy secretions. Moderate left and mild right frontal sinus mucosal thickening with left frontoethmoidal frothy secretion/opacification. Near complete opacification of the right maxillary sinus and left maxillary sinus mucosal thickening. Bilateral sphenoid air-fluid levels. Other: No mastoid effusions. IMPRESSION: 1. No evidence of acute intracranial abnormality on this motion limited study. 2. Moderate cerebral atrophy and mild chronic microvascular ischemic disease. 3. Significant paranasal sinus disease, detailed above and suggestive of sinusitis. 4. Multiple areas of ossification along the left convexity (measuring up to 5 mm in thickness) that are favored to reflect dural ossification over ossified meningiomas. No significant mass effect. Electronically Signed   By: Feliberto Harts MD   On: 06/06/2020 10:48   DG Chest Port 1 View  Result Date: 06/06/2020 CLINICAL DATA:  Shortness of breath, COVID positive 06/01/2020 EXAM: PORTABLE CHEST 1 VIEW COMPARISON:  06/01/2020 FINDINGS: Increased mild mid and lower lung mixed interstitial and airspace opacities, worse on the right compatible with COVID related pneumonia. No large effusion or pneumothorax. Stable cardiomegaly without CHF. Aorta atherosclerotic. Degenerative changes of the spine and left shoulder. IMPRESSION: New mild mid and lower lung opacities, worse on the right compatible with COVID related pneumonia. Aortic Atherosclerosis (ICD10-I70.0). Electronically Signed   By: Judie Petit.  Shick M.D.   On: 06/06/2020 08:17      EKG: EKG reads known RBBB and known LPF B and atrial fibrillation though some P waves can be made out so less likely A. fib   A & P   Principal Problem:   Acute respiratory failure (HCC) Active Problems:   S/P AKA (above knee amputation) bilateral (HCC)   DVT (deep venous thrombosis) (HCC)   Diabetes type 2 with atherosclerosis of arteries of extremities (HCC)   OSA (obstructive sleep apnea)   COVID-19    Acute metabolic encephalopathy   1. Acute hypoxic respiratory failure secondary to COVID-19 in an unvaccinated patient a. Currently requiring 3 L/min O2, room air at baseline b. Elevated inflammatory markers including CRP c. Start steroids d. Start remdesivir e. Start baricitinib.  Discussed risks/benefits with patients family who agree to start f. Continue Dulera and as needed inhalers g. Symptom spirometry and flutter valve h. Currently full code though he is at risk of decline he may need further GOC discussions  2. Acute metabolic encephalopathy, likely secondary to COVID-19 and hypoxia a. Correct underlying conditions as above  3. Lactic acidosis secondary to COVID-19, resolved  4. History of DVT  Supratherapeutic INR a. No signs of bleeding b. Vitamin K p.o. x1 c. Hold warfarin and follow daily INR  5. Type 2 diabetes a. Holding glimepiride b. Started on insulin due to steroids per moderate protocol as he is insulin nave  6. Hypertensive urgency a. Hydralazine as needed  7. Chronic pain a. Continue home chronic pain meds but may need to decrease dosage if this is suspected to be worsening his encephalopathy  8. Hyperlipidemia  PVD s/p bilateral AKA a. Continue statin  9. COPD with concern for acute exacerbation secondary to COVID-19 a. Treatment as above  10. OSA a. Unable to use CPAP due to COVID    DVT prophylaxis: None, history of AKA and with supratherapeutic INR   Code Status: Full Code  Diet: Heart healthy carb modified Family  Communication: Admission, patients condition and plan of care including tests being ordered have been discussed with the patient who indicates understanding and agrees with the plan and Code Status. Patient's son and daughter was updated  Disposition Plan: The appropriate patient status for this patient is INPATIENT. Inpatient status is judged to be reasonable and necessary in order to provide the required intensity of service to  ensure the patient's safety. The patient's presenting symptoms, physical exam findings, and initial radiographic and laboratory data in the context of their chronic comorbidities is felt to place them at high risk for further clinical deterioration. Furthermore, it is not anticipated that the patient will be medically stable for discharge from the hospital within 2 midnights of admission. The following factors support the patient status of inpatient.   " The patient's presenting symptoms include generalized weakness, encephalopathy. " The worrisome physical exam findings include hypoxia, wheeze. " The initial radiographic and laboratory data are worrisome because of COVID-19, elevated inflammatory markers. " The chronic co-morbidities include COPD.   * I certify that at the point of admission it is my clinical judgment that the patient will require inpatient hospital care spanning beyond 2 midnights from the point of admission due to high intensity of service, high risk for further deterioration and high frequency of surveillance required.*     The medical decision making on this patient was of high complexity and the patient is at high risk for clinical deterioration, therefore this is a level 3  admission.  Consultants  . None  Procedures  . None  Time Spent on Admission: 70 minutes    Jae Dire, DO [facility]  06/06/2020, 11:42 AM

## 2020-06-06 NOTE — ED Notes (Signed)
PA-C at the bedside to evaluate.  

## 2020-06-06 NOTE — ED Notes (Signed)
Pharmacy paged regarding Remdesevir.

## 2020-06-06 NOTE — ED Notes (Signed)
Trudee Grip, PA-C notified and aware of pt's INR of 8.1. No additional orders at this time. Will continue to monitor.

## 2020-06-06 NOTE — ED Provider Notes (Signed)
[name]-EMERGENCY DEPT Provider Note   CSN: [id number] Arrival date & time: 06/06/20  6301     History Chief Complaint  Patient presents with  . Shortness of Breath    Covid +    Mark Russell is a 85 y.o. male.  HPI  Level 5 caveat 65/64 AMS  85 year old male with a history of BPH, DM type II, GERD, DVT on warfarin, bilateral AKA, hypertension presents to the ER with complaints of altered mental status and hypoxia.  Patient tested positive for COVID-19 on 05/24/2020.  Patient lives with his son and has not gotten out of bed.  Patient is not vaccinated.  No home O2 requirements, EMS found the patient at 88% on room air, placed on 3 L, sats at 95%.  Patient alert to person and place, but not time.    Past Medical History:  Diagnosis Date  . Anxiety   . Avascular necrosis (HCC)   . BPH (benign prostatic hyperplasia)   . Depression   . Diabetes mellitus   . DVT (deep venous thrombosis) (HCC)   . GERD (gastroesophageal reflux disease)   . History of leg amputation (HCC)    both legs  . Hypertension   . Neuropathy   . Peripheral vascular disease (HCC)   . Sleep apnea     Patient Active Problem List   Diagnosis Date Noted  . Bronchitis 08/09/2015  . OSA (obstructive sleep apnea) 11/17/2012  . COPD (chronic obstructive pulmonary disease) (HCC) 11/10/2012  . Dysphagia 03/29/2012    Class: Acute  . Chest pain 03/29/2012  . Diabetes type 2 with atherosclerosis of arteries of extremities (HCC) 03/29/2012  . Acute respiratory failure (HCC) 08/09/2011  . Hypoxemia 08/09/2011  . Respiratory acidosis 08/09/2011  . ARF (acute renal failure) (HCC) 08/07/2011  . Hypoxia 08/06/2011  . Leukocytosis 08/06/2011  . Sepsis(995.91) 08/06/2011  . PVD (peripheral vascular disease) (HCC) 08/06/2011  . S/P AKA (above knee amputation) bilateral (HCC) 08/06/2011  . DVT (deep venous thrombosis) (HCC) 08/06/2011    Past Surgical History:  Procedure Laterality Date  .  ABDOMINAL AORTIC ANEURYSM REPAIR  1988  . BACK SURGERY  1988, 1989  . BALLOON DILATION N/A 04/27/2012   Procedure: BALLOON DILATION;  Surgeon: Shirley Friar, MD;  Location: [facility];  Service: Endoscopy;  Laterality: N/A;  no xray  . BYPASS GRAFT    . ESOPHAGOGASTRODUODENOSCOPY N/A 04/27/2012   Procedure: ESOPHAGOGASTRODUODENOSCOPY (EGD);  Surgeon: Shirley Friar, MD;  Location: [facility];  Service: Endoscopy;  Laterality: N/A;  . LEG AMPUTATION THROUGH KNEE  06/2004       Family History  Problem Relation Age of Onset  . Heart attack Other   . Cancer Other   . Cancer Other   . Heart attack Other   . Esophageal cancer Daughter        Living, 15    Social History   Tobacco Use  . Smoking status: Former Smoker    Packs/day: 0.25    Years: 30.00    Pack years: 7.50    Types: Cigarettes    Quit date: 08/06/1991    Years since quitting: 28.8  . Smokeless tobacco: Never Used  Substance Use Topics  . Alcohol use: No    Home Medications Prior to Admission medications   Medication Sig Start Date End Date Taking? Authorizing Provider  fentaNYL (DURAGESIC - DOSED MCG/HR) 25 MCG/HR patch Place 1 patch onto the skin every other day. 07/15/15  Yes [provider]  gabapentin (NEURONTIN) 300 MG capsule Take 900 mg by mouth 3 (three) times daily. 06/07/15  Yes [provider]  molnupiravir EUA 200 mg CAPS Take 4 capsules (800 mg total) by mouth 2 (two) times daily for 5 days. 06/05/20 06/10/20 Yes Cline Crock R, PA-C  PERCOCET 7.5-325 MG tablet Take 1 tablet by mouth every 6 (six) hours as needed for pain. 05/22/20  Yes [provider]  potassium chloride (KLOR-CON) 10 MEQ tablet Take 1 tablet (10 mEq total) by mouth daily for 14 days. Patient taking differently: Take 10 mEq by mouth 2 (two) times daily. 06/01/20 06/15/20 Yes Placido Sou, PA-C  albuterol (PROVENTIL HFA;VENTOLIN HFA) 108 (90 BASE) MCG/ACT inhaler Inhale into the lungs every 6 (six)  hours as needed for wheezing or shortness of breath.    [provider]  AMITIZA 24 MCG capsule Take 1 tablet by mouth 2 (two) times daily. 07/15/15   [provider]  arformoterol (BROVANA) 15 MCG/2ML NEBU Take 2 mLs (15 mcg total) by nebulization 2 (two) times daily as needed. 08/08/15   Bevelyn Ngo, NP  budesonide (PULMICORT) 0.5 MG/2ML nebulizer solution Take 2 mLs (0.5 mg total) by nebulization 2 (two) times daily. 08/08/15   Bevelyn Ngo, NP  donepezil (ARICEPT) 10 MG tablet Take 10 mg by mouth at bedtime. 04/08/20   [provider]  doxycycline (VIBRA-TABS) 100 MG tablet Take 1 tablet (100 mg total) by mouth 2 (two) times daily. 08/09/15   de Dios, Lantana A, MD  DULoxetine (CYMBALTA) 30 MG capsule Take 1 capsule by mouth daily. 07/15/15   [provider]  FLUoxetine (PROZAC) 20 MG capsule Take 20 mg by mouth daily.    [provider]  glimepiride (AMARYL) 2 MG tablet Take 2 mg by mouth daily before breakfast.    [provider]  glimepiride (AMARYL) 4 MG tablet Take 4 mg by mouth daily. 05/24/20   [provider]  loratadine (CLARITIN) 10 MG tablet Take 1 tablet (10 mg total) by mouth daily. 03/30/12   Meredeth Ide, MD  Menthol, Topical Analgesic, 2 % GEL Apply 1 application topically 3 (three) times daily as needed (for extremity pain).    [provider]  omeprazole (PRILOSEC) 20 MG capsule Take 20 mg by mouth daily.     [provider]  simvastatin (ZOCOR) 40 MG tablet Take 40 mg by mouth every evening.    [provider]  tamsulosin (FLOMAX) 0.4 MG CAPS capsule Take 0.4 mg by mouth daily. 05/24/20   [provider]  traZODone (DESYREL) 150 MG tablet Take 1 tablet by mouth daily. 06/07/15   [provider]  warfarin (COUMADIN) 4 MG tablet Take 2-4 mg by mouth as directed. Takes 1/2 tablet on Sunday Tuesday and Thursday and 1 whole tablet all other days    [provider]     Allergies    Ace inhibitors, Dilaudid [hydromorphone hcl], Metformin and related, Morphine and related, and Pletal [cilostazol]  Review of Systems   Review of Systems  Constitutional: Positive for activity change, appetite change and fatigue. Negative for chills and fever.  HENT: Negative for ear pain and sore throat.   Eyes: Negative for pain and visual disturbance.  Respiratory: Positive for cough and shortness of breath.   Cardiovascular: Negative for chest pain and palpitations.  Gastrointestinal: Negative for abdominal pain and vomiting.  Genitourinary: Negative for dysuria and hematuria.  Musculoskeletal: Negative for arthralgias and back pain.  Skin: Negative for color change and rash.  Neurological: Positive for weakness. Negative for seizures and syncope.  All other systems reviewed and are negative.   Physical Exam Updated Vital Signs BP (!) 210/85   Pulse 83   Temp 98.7 F (37.1 C) (Oral)   Resp (!) 23   SpO2 95%   Physical Exam Vitals and nursing note reviewed.  Constitutional:      Appearance: He is well-developed. He is ill-appearing.  HENT:     Head: Normocephalic and atraumatic.  Eyes:     Conjunctiva/sclera: Conjunctivae normal.  Cardiovascular:     Rate and Rhythm: Regular rhythm. Tachycardia present.     Heart sounds: No murmur heard.   Pulmonary:     Effort: Pulmonary effort is normal. Tachypnea present. No respiratory distress.     Breath sounds: Decreased breath sounds and wheezing present.  Abdominal:     Palpations: Abdomen is soft.     Tenderness: There is no abdominal tenderness.     Comments: Periumbilical hernia, abdomen soft, nontender  Musculoskeletal:     Cervical back: Neck supple.     Comments: Bilateral AKA  Skin:    General: Skin is warm and dry.  Neurological:     General: No focal deficit present.     Mental Status: He is alert.     Cranial Nerves: No cranial nerve deficit.     Motor: No weakness.     Comments: Alert  and oriented x2. Able to follow 2 step commands   Psychiatric:        Mood and Affect: Mood normal.        Behavior: Behavior normal.     ED Results / Procedures / Treatments   Labs (all labs ordered are listed, but only abnormal results are displayed) Labs Reviewed  RESP PANEL BY RT-PCR (FLU A&B, COVID) ARPGX2 - Abnormal; Notable for the following components:      Result Value   SARS Coronavirus 2 by RT PCR POSITIVE (*)    All other components within normal limits  LACTIC ACID, PLASMA - Abnormal; Notable for the following components:   Lactic Acid, Venous 2.5 (*)    All other components within normal limits  CBC WITH DIFFERENTIAL/PLATELET - Abnormal; Notable for the following components:   WBC 12.0 (*)    Neutro Abs 10.0 (*)    Lymphs Abs 0.6 (*)    Monocytes Absolute 1.3 (*)    Abs Immature Granulocytes 0.11 (*)    All other components within normal limits  COMPREHENSIVE METABOLIC PANEL - Abnormal; Notable for the following components:   Sodium 133 (*)    Chloride 95 (*)    Glucose, Bld 155 (*)    Creatinine, Ser 0.55 (*)    Calcium 8.3 (*)    Albumin 3.2 (*)    All other components within normal limits  D-DIMER, QUANTITATIVE - Abnormal; Notable for the following components:   D-Dimer, Quant 0.98 (*)    All other components within normal limits  LACTATE DEHYDROGENASE - Abnormal; Notable for the following components:   LDH 207 (*)    All other components within normal limits  FIBRINOGEN - Abnormal; Notable for the following components:   Fibrinogen >800 (*)    All other components within normal limits  C-REACTIVE PROTEIN - Abnormal; Notable for the following components:   CRP 23.3 (*)    All other components within normal limits  BLOOD GAS, VENOUS - Abnormal; Notable for the following components:  pH, Ven 7.435 (*)    pCO2, Ven 42.7 (*)    Bicarbonate 28.2 (*)    Acid-Base Excess 3.9 (*)    All other components within normal limits  PROTIME-INR - Abnormal; Notable  for the following components:   Prothrombin Time 67.6 (*)    INR 8.1 (*)    All other components within normal limits  CULTURE, BLOOD (ROUTINE X 2)  CULTURE, BLOOD (ROUTINE X 2)  LACTIC ACID, PLASMA  PROCALCITONIN  FERRITIN  TRIGLYCERIDES  URINALYSIS, ROUTINE W REFLEX MICROSCOPIC    EKG None  Radiology CT Head Wo Contrast  Result Date: 06/06/2020 CLINICAL DATA:  Delirium. EXAM: CT HEAD WITHOUT CONTRAST TECHNIQUE: Contiguous axial images were obtained from the base of the skull through the vertex without intravenous contrast. COMPARISON:  None. FINDINGS: Motion limited study.  Within this limitation: Brain: No evidence of acute large vascular territory infarction, hemorrhage, hydrocephalus, extra-axial collection or mass effect. Multiple areas of ossification along the left convexity, measuring up to 5 mm in thickness convexity (series 2, image 53). Moderate generalized cerebral volume loss. Mild patchy white matter hypoattenuation, most likely related to chronic microvascular ischemic disease. Vascular: No hyperdense vessel identified. Calcific intracranial atherosclerosis. Skull: No acute fracture. Sinuses/Orbits: Marked ethmoid air cell mucosal thickening with frothy secretions. Moderate left and mild right frontal sinus mucosal thickening with left frontoethmoidal frothy secretion/opacification. Near complete opacification of the right maxillary sinus and left maxillary sinus mucosal thickening. Bilateral sphenoid air-fluid levels. Other: No mastoid effusions. IMPRESSION: 1. No evidence of acute intracranial abnormality on this motion limited study. 2. Moderate cerebral atrophy and mild chronic microvascular ischemic disease. 3. Significant paranasal sinus disease, detailed above and suggestive of sinusitis. 4. Multiple areas of ossification along the left convexity (measuring up to 5 mm in thickness) that are favored to reflect dural ossification over ossified meningiomas. No significant mass  effect. Electronically Signed   By: Feliberto Harts MD   On: 06/06/2020 10:48   DG Chest Port 1 View  Result Date: 06/06/2020 CLINICAL DATA:  Shortness of breath, COVID positive 06/01/2020 EXAM: PORTABLE CHEST 1 VIEW COMPARISON:  06/01/2020 FINDINGS: Increased mild mid and lower lung mixed interstitial and airspace opacities, worse on the right compatible with COVID related pneumonia. No large effusion or pneumothorax. Stable cardiomegaly without CHF. Aorta atherosclerotic. Degenerative changes of the spine and left shoulder. IMPRESSION: New mild mid and lower lung opacities, worse on the right compatible with COVID related pneumonia. Aortic Atherosclerosis (ICD10-I70.0). Electronically Signed   By: Judie Petit.  Shick M.D.   On: 06/06/2020 08:17    Procedures Procedures   Medications Ordered in ED Medications  remdesivir 200 mg in sodium chloride 0.9% 250 mL IVPB (200 mg Intravenous New Bag/Given 06/06/20 0946)    Followed by  remdesivir 100 mg in sodium chloride 0.9 % 100 mL IVPB (has no administration in time range)  methylPREDNISolone sodium succinate (SOLU-MEDROL) 125 mg/2 mL injection 125 mg (125 mg Intravenous Given 06/06/20 4008)    ED Course  I have reviewed the triage vital signs and the nursing notes.  Pertinent labs & imaging results that were available during my care of the patient were reviewed by me and considered in my medical decision making (see chart for details).    MDM Rules/Calculators/A&P                          85 year old male presents to the ER with complaints of hypoxia, weakness, altered mental status.  On arrival, he is tachypneic, with audible wheezes on exam.  Chronically ill-appearing.  Patient hypertensive with a blood pressure 176/94, tachypneic with a rate of 23.  On 4 L nasal cannula with sats in the mid 90s.  Labs and imaging ordered, reviewed and interpreted by me.  CBC with a leukocytosis of 12, CMP with mild hyponatremia of 133, evidence of dehydration with a  creatinine of 0.55, COVID test is positive here.  Chest x-ray consistent with COVID-pneumonia.  CT of the head without evidence of stroke or bleed.  Spoke with the patient's son and discussed plan for admission.  He was understanding and is agreeable.  Patient received remdesivir, Solu-Medrol.  Consulted hospitalist team for admission.  Spoke with Dr. Henrene Hawking with the hospitalist team will admit the patient for further evaluation and treatment.  Stable for admission.  This was a shared visit with my supervising physician Dr. Rhunette Croft who independently saw and evaluated the patient & provided guidance in evaluation/management/disposition ,in agreement with care  Final Clinical Impression(s) / ED Diagnoses Final diagnoses:  Acute hypoxemic respiratory failure due to severe acute respiratory syndrome coronavirus 2 (SARS-CoV-2) disease [facility])    Rx / DC Orders ED Discharge Orders    None       Mare Ferrari, PA-C 06/06/20 1052    Derwood Kaplan, MD 06/07/20 [phone]

## 2020-06-06 NOTE — ED Notes (Signed)
Pt readjusted in bed. New condom catheter applied.

## 2020-06-06 NOTE — ED Triage Notes (Addendum)
Pt presents to the ED for SOB from home via EMS. Per EMS pt tested positive for Covid on 06/01/20. Pt lives with his son and the son states the pt has not gotten out of bed since. In the ED pt is alert and oriented to person and place alone. Per EMS, pr is normally A&O x4, per pt's family member. Pt transported to ED on 3L [place] O2. Pt does not normally wear home O2 and was 88% on room air on EMS arrival.

## 2020-06-06 NOTE — ED Notes (Signed)
RT unable to obtain ABG. PA-C notified and aware. VBG obtained.

## 2020-06-07 ENCOUNTER — Telehealth: Payer: Self-pay

## 2020-06-07 LAB — GLUCOSE, CAPILLARY
Glucose-Capillary: 161 mg/dL — ABNORMAL HIGH (ref 70–99)
Glucose-Capillary: 151 mg/dL — ABNORMAL HIGH (ref 70–99)
Glucose-Capillary: 153 mg/dL — ABNORMAL HIGH (ref 70–99)
Glucose-Capillary: 153 mg/dL — ABNORMAL HIGH (ref 70–99)

## 2020-06-07 LAB — COMPREHENSIVE METABOLIC PANEL
ALT: 18 U/L (ref 0–44)
AST: 31 U/L (ref 15–41)
Albumin: 3 g/dL — ABNORMAL LOW (ref 3.5–5.0)
Alkaline Phosphatase: 44 U/L (ref 38–126)
Anion gap: 10 (ref 5–15)
BUN: 17 mg/dL (ref 8–23)
CO2: 27 mmol/L (ref 22–32)
Calcium: 8.5 mg/dL — ABNORMAL LOW (ref 8.9–10.3)
Chloride: 99 mmol/L (ref 98–111)
Creatinine, Ser: 0.69 mg/dL (ref 0.61–1.24)
GFR, Estimated: >60 mL/min (ref >60)
Glucose, Bld: 181 mg/dL — ABNORMAL HIGH (ref 70–99)
Potassium: 4.3 mmol/L (ref 3.5–5.1)
Sodium: 136 mmol/L (ref 135–145)
Total Bilirubin: 0.8 mg/dL (ref 0.3–1.2)
Total Protein: 7.3 g/dL (ref 6.5–8.1)

## 2020-06-07 LAB — CBC WITH DIFFERENTIAL/PLATELET
Abs Immature Granulocytes: 0.08 10*3/uL — ABNORMAL HIGH (ref 0.00–0.07)
Basophils Absolute: 0 10*3/uL (ref 0.0–0.1)
Basophils Relative: 0 %
Eosinophils Absolute: 0 10*3/uL (ref 0.0–0.5)
Eosinophils Relative: 0 %
HCT: 46.5 % (ref 39.0–52.0)
Hemoglobin: 14.3 g/dL (ref 13.0–17.0)
Immature Granulocytes: 1 %
Lymphocytes Relative: 4 %
Lymphs Abs: 0.3 10*3/uL — ABNORMAL LOW (ref 0.7–4.0)
MCH: 29.2 pg (ref 26.0–34.0)
MCHC: 30.8 g/dL (ref 30.0–36.0)
MCV: 95.1 fL (ref 80.0–100.0)
Monocytes Absolute: 0.4 10*3/uL (ref 0.1–1.0)
Monocytes Relative: 5 %
Neutro Abs: 8 10*3/uL — ABNORMAL HIGH (ref 1.7–7.7)
Neutrophils Relative %: 90 %
Platelets: 295 10*3/uL (ref 150–400)
RBC: 4.89 MIL/uL (ref 4.22–5.81)
RDW: 15.1 % (ref 11.5–15.5)
WBC: 8.8 10*3/uL (ref 4.0–10.5)
nRBC: 0 % (ref 0.0–0.2)

## 2020-06-07 LAB — PROTIME-INR
INR: 2 — ABNORMAL HIGH (ref 0.8–1.2)
Prothrombin Time: 22.4 seconds — ABNORMAL HIGH (ref 11.4–15.2)

## 2020-06-07 LAB — D-DIMER, QUANTITATIVE: D-Dimer, Quant: 1.05 ug/mL-FEU — ABNORMAL HIGH (ref 0.00–0.50)

## 2020-06-07 LAB — FERRITIN: Ferritin: 175 ng/mL (ref 24–336)

## 2020-06-07 LAB — C-REACTIVE PROTEIN: CRP: 22.2 mg/dL — ABNORMAL HIGH (ref <1.0)

## 2020-06-07 MED ORDER — WARFARIN SODIUM 4 MG PO TABS
4.0000 mg | ORAL_TABLET | Freq: Once | ORAL | Status: AC
  Administered 2020-06-07: 4 mg via ORAL
  Filled 2020-06-07: qty 1

## 2020-06-07 MED ORDER — WARFARIN - PHARMACIST DOSING INPATIENT: Freq: Every day | Status: DC

## 2020-06-07 MED ORDER — GABAPENTIN 100 MG PO CAPS
200.0000 mg | ORAL_CAPSULE | Freq: Three times a day (TID) | ORAL | Status: DC
  Administered 2020-06-07 – 2020-06-10 (×10): 200 mg via ORAL
  Filled 2020-06-07 (×10): qty 2

## 2020-06-07 NOTE — Progress Notes (Addendum)
Went into patient room for 2200 medication pass, and patient has dislodged I.V. catheter. Patient also removed telemetry box and is refusing to put it on. Patient said he doesn't want another I.V. placed. I told him that we needed to give him I.V. medication but he said he doesn't care. Night shift provider notified

## 2020-06-07 NOTE — Progress Notes (Signed)
PROGRESS NOTE    Mark Russell  NOB:[id number] DOB: 10/27/1935 DOA: 06/06/2020 PCP: Maurice Small, MD   Chief Complain: Shortness of breath.  Brief Narrative:  Patient is 85 year old male, unvaccinated against COVID, past medical history of OSA, COPD, type 2 diabetes, peripheral vascular disease status post AKA bilaterally, avascular necrosis, DVT on warfarin, hypertension, recently diagnosed with COVID on 5/22 presented to ED with confusion, shortness of breath.Patient was recently in the ED on 5/28 due to rash on his left buttock which was concerning for dermatitis and stage I left buttock pressure ulcer.  Patient was offered a prescription for Paxlovid vs Bebtelovimab in ED but family declined due to lack of symptoms.  He began having symptoms with lethargy and weakness on 5/28 and hasnt been out of  bed since and was prescribed molnupiravir on 6/1.  On presentation, he was hypoxic on room air and was placed on 3 L of oxygen per minute.  He was tachypneic, hypertensive, lactate level of 2.5, had WBC of 12, COVID screen test was positive, fibrinogen was more than 800.  Chest x-ray showed new mid mild and lower lung opacities worse than right.  CTA did not show any acute intracranial arteries.  Patient was admitted for the management of acute respiratory failure from COVID pneumonia.  Started on remdesivir, Solu-Medrol and baricitinib.  Assessment & Plan:   Principal Problem:   Acute respiratory failure (HCC) Active Problems:   S/P AKA (above knee amputation) bilateral (HCC)   DVT (deep venous thrombosis) (HCC)   Diabetes type 2 with atherosclerosis of arteries of extremities (HCC)   OSA (obstructive sleep apnea)   COVID-19   Acute metabolic encephalopathy   Acute hypoxic respite failure secondary to COVID pneumonia: Unvaccinated patient.  Currently on 2 L of oxygen per minute.  Does not need oxygen at home.  Elevated inflammatory markers including CRP.  Started on steroids, remdesivir,  baricitinib.  Continue supplemental oxygen as needed, bronchodilators, flutter valve, incentive spirometry.  High risk for decompensation. Monitor inflammatory markers.  Continue to try to taper the oxygen.  Frequent prone positioning if deteriorates or hypoxic on current oxygen requirement.  COPD exacerbation: Most likely secondary to COVID.  Continue current treatment  Acute metabolic encephalopathy: Secondary to COVID infection and hypoxia.  Continue monitor mental status.  Lactic acidosis: Most likely secondary to dehydration.  Supratherapeutic INR/history of DVT: On warfarin at home. INR was 8.1 on presentation,and was given vitamin K.  INR is 2 today.  We will start warfarin from tomorrow  Type II diabetes mellitus: Takes glimepiride at home.  Currently on sliding scale insulin.  Monitor blood sugars while on steroids.  Hypertensive urgency: Currently blood pressure stable.  Continue.  Medications for severe hypertension  Hyperlipidemia/history of peripheral vascular disease: Status post bilateral AKA.  Continue statin  OSA: Unable to use CPAP due to COVID.  Chronic pain syndrome: Continue supportive care, minimize sedatives/narcotics  Goals of care: 85 year old male with multiple comorbidities including pneumonia from COVID.  Remains full code.  Needs goals of care discussion.  Palliative care consulted.          DVT prophylaxis:None today Code Status:Full  Family Communication: Daughter at bedside on 06/07/20 Status is: Inpatient  Remains inpatient appropriate because:Inpatient level of care appropriate due to severity of illness   Dispo: The patient is from: Home              Anticipated d/c is to: Home  Patient currently is not medically stable to d/c.   Difficult to place patient No    Consultants: None  Procedures:None  Antimicrobials:  Anti-infectives (From admission, onward)   Start     Dose/Rate Route Frequency Ordered Stop   06/07/20 1000   remdesivir 100 mg in sodium chloride 0.9 % 100 mL IVPB       "Followed by" Linked Group Details   100 mg 200 mL/hr over 30 Minutes Intravenous Every 24 hours 06/06/20 0858 06/11/20 0959   06/07/20 1000  remdesivir 100 mg in sodium chloride 0.9 % 100 mL IVPB  Status:  Discontinued       "Followed by" Linked Group Details   100 mg 200 mL/hr over 30 Minutes Intravenous Daily 06/06/20 1124 06/06/20 1129   06/06/20 1130  remdesivir 200 mg in sodium chloride 0.9% 250 mL IVPB  Status:  Discontinued       "Followed by" Linked Group Details   200 mg 580 mL/hr over 30 Minutes Intravenous Once 06/06/20 1124 06/06/20 1129   06/06/20 0930  remdesivir 200 mg in sodium chloride 0.9% 250 mL IVPB       "Followed by" Linked Group Details   200 mg 580 mL/hr over 30 Minutes Intravenous Once 06/06/20 0858 06/06/20 1100      Subjective:  Patient seen and examined the bedside this morning.  Hemodynamically stable during evaluation.  Daughter at the bedside.  Having some congestion, cough but he was on room air when I entered the room.  He says he feels better and wants to go home.   Objective: Vitals:   06/06/20 1924 06/06/20 2255 06/07/20 0220 06/07/20 0515  BP: (!) 136/101 (!) 178/83 (!) 158/101 (!) 149/63  Pulse:  82 89 61  Resp:      Temp:  99 F (37.2 C) 99.2 F (37.3 C) 98.9 F (37.2 C)  TempSrc:  Axillary Oral Oral  SpO2:  90% 98% 94%  Weight:      Height:        Intake/Output Summary (Last 24 hours) at 06/07/2020 0743 Last data filed at 06/07/2020 [id number] Gross per 24 hour  Intake 480 ml  Output 300 ml  Net 180 ml   Filed Weights   06/06/20 1431  Weight: 89.5 kg    Examination:  General exam: Appears calm and comfortable ,Not in distress,obese HEENT:PERRL,Oral mucosa moist, Ear/Nose normal on gross exam Respiratory system: Bilateral rhonchi, wheezes  cardiovascular system: S1 & S2 heard, RRR. No JVD, murmurs, rubs, gallops or clicks.. Gastrointestinal system: Abdomen is  nondistended, soft and nontender. No organomegaly or masses felt. Normal bowel sounds heard. Central nervous system: Alert and oriented. No focal neurological deficits. Extremities: B/L AKA Skin: No rashes, lesions or ulcers,no icterus ,no pallor   Data Reviewed: I have personally reviewed following labs and imaging studies  CBC: Recent Labs  Lab 06/01/20 1058 06/06/20 0736 06/07/20 0323  WBC 7.8 12.0* 8.8  NEUTROABS 6.4 10.0* 8.0*  HGB 16.8 15.6 14.3  HCT 52.7* 49.8 46.5  MCV 93.1 93.3 95.1  PLT 256 285 295   Basic Metabolic Panel: Recent Labs  Lab 06/01/20 1058 06/06/20 0736 06/07/20 0323  NA 138 133* 136  K 2.9* 4.3 4.3  CL 93* 95* 99  CO2 31 27 27   GLUCOSE 110* 155* 181*  BUN 13 9 17   CREATININE 0.86 0.55* 0.69  CALCIUM 8.8* 8.3* 8.5*  MG 2.4  --   --    GFR: Estimated Creatinine Clearance: 48.7 mL/min (by  C-G formula based on SCr of 0.69 mg/dL). Liver Function Tests: Recent Labs  Lab 06/01/20 1058 06/06/20 0736 06/07/20 0323  AST 32 36 31  ALT 14 17 18   ALKPHOS 48 51 44  BILITOT 0.4 1.0 0.8  PROT 8.7* 8.1 7.3  ALBUMIN 3.8 3.2* 3.0*   No results for input(s): LIPASE, AMYLASE in the last 168 hours. No results for input(s): AMMONIA in the last 168 hours. Coagulation Profile: Recent Labs  Lab 06/06/20 0736 06/07/20 0323  INR 8.1* 2.0*   Cardiac Enzymes: No results for input(s): CKTOTAL, CKMB, CKMBINDEX, TROPONINI in the last 168 hours. BNP (last 3 results) No results for input(s): PROBNP in the last 8760 hours. HbA1C: No results for input(s): HGBA1C in the last 72 hours. CBG: Recent Labs  Lab 06/06/20 1244 06/06/20 1408 06/06/20 1652 06/06/20 2121 06/07/20 0727  GLUCAP 200* 218* 276* 155* 161*   Lipid Profile: Recent Labs    06/06/20 0736  TRIG 83   Thyroid Function Tests: No results for input(s): TSH, T4TOTAL, FREET4, T3FREE, THYROIDAB in the last 72 hours. Anemia Panel: Recent Labs    06/06/20 0736 06/07/20 0323  FERRITIN  183 175   Sepsis Labs: Recent Labs  Lab 06/06/20 0736 06/06/20 0954  PROCALCITON <0.10  --   LATICACIDVEN 2.5* 1.8    Recent Results (from the past 240 hour(s))  Resp Panel by RT-PCR (Flu A&B, Covid) Nasopharyngeal Swab     Status: Abnormal   Collection Time: 06/01/20 10:58 AM   Specimen: Nasopharyngeal Swab; Nasopharyngeal(NP) swabs in vial transport medium  Result Value Ref Range Status   SARS Coronavirus 2 by RT PCR POSITIVE (A) NEGATIVE Final    Comment: RESULT CALLED TO, READ BACK BY AND VERIFIED WITH: SAVOIE,B. RN AT 1308 06/01/20 MULLINS,T (NOTE) SARS-CoV-2 target nucleic acids are DETECTED.  The SARS-CoV-2 RNA is generally detectable in upper respiratory specimens during the acute phase of infection. Positive results are indicative of the presence of the identified virus, but do not rule out bacterial infection or co-infection with other pathogens not detected by the test. Clinical correlation with patient history and other diagnostic information is necessary to determine patient infection status. The expected result is Negative.  Fact Sheet for Patients: BloggerCourse.com  Fact Sheet for Healthcare Providers: SeriousBroker.it  This test is not yet approved or cleared by the [place] FDA and  has been authorized for detection and/or diagnosis of SARS-CoV-2 by FDA under an Emergency Use Authorization (EUA).  This EUA will remain in effect (meaning this test  can be used) for the duration of  the COVID-19 declaration under Section 564(b)(1) of the Act, 21 U.S.C. section 360bbb-3(b)(1), unless the authorization is terminated or revoked sooner.     Influenza A by PCR NEGATIVE NEGATIVE Final   Influenza B by PCR NEGATIVE NEGATIVE Final    Comment: (NOTE) The Xpert Xpress SARS-CoV-2/FLU/RSV plus assay is intended as an aid in the diagnosis of influenza from Nasopharyngeal swab specimens and should not be used as  a sole basis for treatment. Nasal washings and aspirates are unacceptable for Xpert Xpress SARS-CoV-2/FLU/RSV testing.  Fact Sheet for Patients: BloggerCourse.com  Fact Sheet for Healthcare Providers: SeriousBroker.it  This test is not yet approved or cleared by the [place] FDA and has been authorized for detection and/or diagnosis of SARS-CoV-2 by FDA under an Emergency Use Authorization (EUA). This EUA will remain in effect (meaning this test can be used) for the duration of the COVID-19 declaration under Section 564(b)(1) of the  Act, 21 U.S.C. section 360bbb-3(b)(1), unless the authorization is terminated or revoked.  Performed at [facility], [address], [place], [place] [address]   Blood Culture (routine x 2)     Status: None (Preliminary result)   Collection Time: 06/06/20  7:36 AM   Specimen: BLOOD  Result Value Ref Range Status   Specimen Description   Final    BLOOD LEFT ANTECUBITAL Performed at [facility], [address], [place], [place] [address]    Special Requests   Final    BOTTLES DRAWN AEROBIC AND ANAEROBIC Blood Culture results may not be optimal due to an inadequate volume of blood received in culture bottles Performed at [facility], [address], [place], [place] [address]    Culture   Final    NO GROWTH < 24 HOURS Performed at [facility], [address] [place], [place] [address]    Report Status PENDING  Incomplete  Blood Culture (routine x 2)     Status: None (Preliminary result)   Collection Time: 06/06/20  7:36 AM   Specimen: BLOOD  Result Value Ref Range Status   Specimen Description   Final    BLOOD RIGHT ANTECUBITAL Performed at [facility], [address] [place], [place] [address]    Special Requests   Final    BOTTLES DRAWN AEROBIC AND ANAEROBIC Blood Culture results may not be optimal due  to an inadequate volume of blood received in culture bottles Performed at [facility], [address], [place], [place] [address]    Culture   Final    NO GROWTH < 24 HOURS Performed at [facility], [address] [place], [place] [address]    Report Status PENDING  Incomplete  Resp Panel by RT-PCR (Flu A&B, Covid) Nasopharyngeal Swab     Status: Abnormal   Collection Time: 06/06/20  7:45 AM   Specimen: Nasopharyngeal Swab; Nasopharyngeal(NP) swabs in vial transport medium  Result Value Ref Range Status   SARS Coronavirus 2 by RT PCR POSITIVE (A) NEGATIVE Final    Comment: CRITICAL RESULT CALLED TO, READ BACK BY AND VERIFIED WITH: SOVOIE B. ON  06/06/2020 @ 0917 BY MECIAL J (NOTE) SARS-CoV-2 target nucleic acids are DETECTED.  The SARS-CoV-2 RNA is generally detectable in upper respiratory specimens during the acute phase of infection. Positive results are indicative of the presence of the identified virus, but do not rule out bacterial infection or co-infection with other pathogens not detected by the test. Clinical correlation with patient history and other diagnostic information is necessary to determine patient infection status. The expected result is Negative.  Fact Sheet for Patients: BloggerCourse.com  Fact Sheet for Healthcare Providers: SeriousBroker.it  This test is not yet approved or cleared by the [place] FDA and  has been authorized for detection and/or diagnosis of SARS-CoV-2 by FDA under an Emergency Use Authorization (EUA).  This EUA will remain in effect (meani ng this test can be used) for the duration of  the COVID-19 declaration under Section 564(b)(1) of the Act, 21 U.S.C. section 360bbb-3(b)(1), unless the authorization is terminated or revoked sooner.     Influenza A by PCR NEGATIVE NEGATIVE Final   Influenza B by PCR NEGATIVE NEGATIVE Final    Comment: (NOTE) The  Xpert Xpress SARS-CoV-2/FLU/RSV plus assay is intended as an aid in the diagnosis of influenza from Nasopharyngeal swab specimens and should not be used as a sole  basis for treatment. Nasal washings and aspirates are unacceptable for Xpert Xpress SARS-CoV-2/FLU/RSV testing.  Fact Sheet for Patients: BloggerCourse.com  Fact Sheet for Healthcare Providers: SeriousBroker.it  This test is not yet approved or cleared by the [place] FDA and has been authorized for detection and/or diagnosis of SARS-CoV-2 by FDA under an Emergency Use Authorization (EUA). This EUA will remain in effect (meaning this test can be used) for the duration of the COVID-19 declaration under Section 564(b)(1) of the Act, 21 U.S.C. section 360bbb-3(b)(1), unless the authorization is terminated or revoked.  Performed at [facility], [address], [place], [place] [address]          Radiology Studies: CT Head Wo Contrast  Result Date: 06/06/2020 CLINICAL DATA:  Delirium. EXAM: CT HEAD WITHOUT CONTRAST TECHNIQUE: Contiguous axial images were obtained from the base of the skull through the vertex without intravenous contrast. COMPARISON:  None. FINDINGS: Motion limited study.  Within this limitation: Brain: No evidence of acute large vascular territory infarction, hemorrhage, hydrocephalus, extra-axial collection or mass effect. Multiple areas of ossification along the left convexity, measuring up to 5 mm in thickness convexity (series 2, image 53). Moderate generalized cerebral volume loss. Mild patchy white matter hypoattenuation, most likely related to chronic microvascular ischemic disease. Vascular: No hyperdense vessel identified. Calcific intracranial atherosclerosis. Skull: No acute fracture. Sinuses/Orbits: Marked ethmoid air cell mucosal thickening with frothy secretions. Moderate left and mild right frontal sinus mucosal thickening  with left frontoethmoidal frothy secretion/opacification. Near complete opacification of the right maxillary sinus and left maxillary sinus mucosal thickening. Bilateral sphenoid air-fluid levels. Other: No mastoid effusions. IMPRESSION: 1. No evidence of acute intracranial abnormality on this motion limited study. 2. Moderate cerebral atrophy and mild chronic microvascular ischemic disease. 3. Significant paranasal sinus disease, detailed above and suggestive of sinusitis. 4. Multiple areas of ossification along the left convexity (measuring up to 5 mm in thickness) that are favored to reflect dural ossification over ossified meningiomas. No significant mass effect. Electronically Signed   By: Feliberto Harts MD   On: 06/06/2020 10:48   DG Chest Port 1 View  Result Date: 06/06/2020 CLINICAL DATA:  Shortness of breath, COVID positive 06/01/2020 EXAM: PORTABLE CHEST 1 VIEW COMPARISON:  06/01/2020 FINDINGS: Increased mild mid and lower lung mixed interstitial and airspace opacities, worse on the right compatible with COVID related pneumonia. No large effusion or pneumothorax. Stable cardiomegaly without CHF. Aorta atherosclerotic. Degenerative changes of the spine and left shoulder. IMPRESSION: New mild mid and lower lung opacities, worse on the right compatible with COVID related pneumonia. Aortic Atherosclerosis (ICD10-I70.0). Electronically Signed   By: Judie Petit.  Shick M.D.   On: 06/06/2020 08:17        Scheduled Meds: . baricitinib  4 mg Oral Daily  . donepezil  10 mg Oral QHS  . DULoxetine  30 mg Oral Daily  . fentaNYL  1 patch Transdermal Q72H  . gabapentin  900 mg Oral TID  . insulin aspart  0-15 Units Subcutaneous TID WC  . insulin aspart  0-5 Units Subcutaneous QHS  . insulin detemir  0.1 Units/kg Subcutaneous BID  . Ipratropium-Albuterol  1 puff Inhalation Q6H  . linagliptin  5 mg Oral Daily  . mouth rinse  15 mL Mouth Rinse BID  . methylPREDNISolone (SOLU-MEDROL) injection  40 mg  Intravenous Q12H   Followed by  . [START ON 06/10/2020] predniSONE  50 mg Oral Daily  . mometasone-formoterol  2 puff Inhalation BID  . simvastatin  40  mg Oral QPM  . sodium chloride flush  3 mL Intravenous Q12H  . tamsulosin  0.4 mg Oral Daily   Continuous Infusions: . remdesivir 100 mg in NS 100 mL       LOS: 1 day    Time spent: 35 mins.More than 50% of that time was spent in counseling and/or coordination of care.      Burnadette Pop, MD [facility] P6/03/2020, 7:43 AM

## 2020-06-07 NOTE — Progress Notes (Signed)
ANTICOAGULATION CONSULT NOTE   Pharmacy Consult for warfarin Indication:hx DVT  Allergies  Allergen Reactions  . Ace Inhibitors     hypotension  . Dilaudid [Hydromorphone Hcl]     "Mean"  . Metformin And Related     GI upset  . Morphine And Related Other (See Comments)    "mean"  . Pletal [Cilostazol]     SOB    Vital Signs: Temp: 98.9 F (37.2 C) (06/03 0515) Temp Source: Oral (06/03 0515) BP: 149/63 (06/03 0515) Pulse Rate: 61 (06/03 0515)  Labs: Recent Labs    06/06/20 0736 06/07/20 0323  HGB 15.6 14.3  HCT 49.8 46.5  PLT 285 295  LABPROT 67.6* 22.4*  INR 8.1* 2.0*  CREATININE 0.55* 0.69    Estimated Creatinine Clearance: 48.7 mL/min (by C-G formula based on SCr of 0.69 mg/dL).   Medical History: Past Medical History:  Diagnosis Date  . Anxiety   . Avascular necrosis (HCC)   . BPH (benign prostatic hyperplasia)   . Depression   . Diabetes mellitus   . DVT (deep venous thrombosis) (HCC)   . GERD (gastroesophageal reflux disease)   . History of leg amputation (HCC)    both legs  . Hypertension   . Neuropathy   . Peripheral vascular disease (HCC)   . Sleep apnea     Medications:  - PTA warfarin regimen: 4mg  daily except 2mg  on MWF (last dose taken on 6/1)  Assessment: Patient is an 85 y.o M with hx DVT on warfarin PTA, who tested positive for COVID on 5/28, presented to the ED on 6/2 with c/o SOB.  Pharmacy has been consulted to dose warfarin.  Yesterdays INR supra-therapeutic at 8.1, vitamin K 2.5mg  po x 1 administered and warfarin dose held.  Today, 06/07/2020: - INR therapeutic at 2 - cbc ok. No bleeding documented  Goal of Therapy:  INR 2-3 Monitor platelets by anticoagulation protocol: Yes   Plan:  Warfarin 4 mg po x 1 today Monitor daily INR, CBC, s/s bleeding   Marland Reine P. 8/2, PharmD, BCPS Clinical Pharmacist [place] Please utilize Amion for appropriate phone number to reach the unit pharmacist [facility]) 06/07/2020  9:22 AM

## 2020-06-07 NOTE — Telephone Encounter (Signed)
Unable to contact patient to schedule COVID infusion. Home number rings and no one answers, and his cell number goes straight to voicemail but voicemail has not been set up yet.

## 2020-06-08 DIAGNOSIS — J9601 Acute respiratory failure with hypoxia: Secondary | ICD-10-CM

## 2020-06-08 LAB — GLUCOSE, CAPILLARY
Glucose-Capillary: 87 mg/dL (ref 70–99)
Glucose-Capillary: 161 mg/dL — ABNORMAL HIGH (ref 70–99)
Glucose-Capillary: 210 mg/dL — ABNORMAL HIGH (ref 70–99)
Glucose-Capillary: 85 mg/dL (ref 70–99)

## 2020-06-08 LAB — COMPREHENSIVE METABOLIC PANEL
ALT: 23 U/L (ref 0–44)
AST: 34 U/L (ref 15–41)
Albumin: 2.9 g/dL — ABNORMAL LOW (ref 3.5–5.0)
Alkaline Phosphatase: 37 U/L — ABNORMAL LOW (ref 38–126)
Anion gap: 10 (ref 5–15)
BUN: 16 mg/dL (ref 8–23)
CO2: 30 mmol/L (ref 22–32)
Calcium: 8.3 mg/dL — ABNORMAL LOW (ref 8.9–10.3)
Chloride: 95 mmol/L — ABNORMAL LOW (ref 98–111)
Creatinine, Ser: 0.55 mg/dL — ABNORMAL LOW (ref 0.61–1.24)
GFR, Estimated: >60 mL/min (ref >60)
Glucose, Bld: 106 mg/dL — ABNORMAL HIGH (ref 70–99)
Potassium: 3.5 mmol/L (ref 3.5–5.1)
Sodium: 135 mmol/L (ref 135–145)
Total Bilirubin: 0.7 mg/dL (ref 0.3–1.2)
Total Protein: 7 g/dL (ref 6.5–8.1)

## 2020-06-08 LAB — CBC WITH DIFFERENTIAL/PLATELET
Abs Immature Granulocytes: 0.15 10*3/uL — ABNORMAL HIGH (ref 0.00–0.07)
Basophils Absolute: 0 10*3/uL (ref 0.0–0.1)
Basophils Relative: 0 %
Eosinophils Absolute: 0 10*3/uL (ref 0.0–0.5)
Eosinophils Relative: 0 %
HCT: 46.7 % (ref 39.0–52.0)
Hemoglobin: 14.6 g/dL (ref 13.0–17.0)
Immature Granulocytes: 1 %
Lymphocytes Relative: 5 %
Lymphs Abs: 0.6 10*3/uL — ABNORMAL LOW (ref 0.7–4.0)
MCH: 29.4 pg (ref 26.0–34.0)
MCHC: 31.3 g/dL (ref 30.0–36.0)
MCV: 94.2 fL (ref 80.0–100.0)
Monocytes Absolute: 1.1 10*3/uL — ABNORMAL HIGH (ref 0.1–1.0)
Monocytes Relative: 9 %
Neutro Abs: 10.7 10*3/uL — ABNORMAL HIGH (ref 1.7–7.7)
Neutrophils Relative %: 85 %
Platelets: 340 10*3/uL (ref 150–400)
RBC: 4.96 MIL/uL (ref 4.22–5.81)
RDW: 14.9 % (ref 11.5–15.5)
WBC: 12.5 10*3/uL — ABNORMAL HIGH (ref 4.0–10.5)
nRBC: 0 % (ref 0.0–0.2)

## 2020-06-08 LAB — D-DIMER, QUANTITATIVE: D-Dimer, Quant: 1.1 ug/mL-FEU — ABNORMAL HIGH (ref 0.00–0.50)

## 2020-06-08 LAB — PROTIME-INR
INR: 1.3 — ABNORMAL HIGH (ref 0.8–1.2)
Prothrombin Time: 16.3 seconds — ABNORMAL HIGH (ref 11.4–15.2)

## 2020-06-08 LAB — FERRITIN: Ferritin: 225 ng/mL (ref 24–336)

## 2020-06-08 LAB — C-REACTIVE PROTEIN: CRP: 9 mg/dL — ABNORMAL HIGH (ref <1.0)

## 2020-06-08 MED ORDER — MUSCLE RUB 10-15 % EX CREA
TOPICAL_CREAM | CUTANEOUS | Status: DC | PRN
  Filled 2020-06-08: qty 85

## 2020-06-08 MED ORDER — WARFARIN SODIUM 5 MG PO TABS
5.0000 mg | ORAL_TABLET | Freq: Once | ORAL | Status: AC
  Administered 2020-06-08: 5 mg via ORAL
  Filled 2020-06-08: qty 1

## 2020-06-08 NOTE — Progress Notes (Signed)
Discussed with Dr. Renford Dills.  [name] is clinically improving and plan to hold on consult at this point.  Please call or reconsult if we can be of further assistance in the care of [name] moving forward.  Romie Minus, MD [facility] Palliative Medicine Team [phone]

## 2020-06-08 NOTE — Progress Notes (Signed)
PROGRESS NOTE    Mark Russell  ZOX:[id number] DOB: 05-Jul-1935 DOA: 06/06/2020 PCP: Maurice Small, MD   Chief Complain: Shortness of breath.  Brief Narrative:  Patient is 85 year old male, unvaccinated against COVID, past medical history of OSA, COPD, type 2 diabetes, peripheral vascular disease status post AKA bilaterally, avascular necrosis, DVT on warfarin, hypertension, recently diagnosed with COVID on 5/22 presented to ED with confusion, shortness of breath.Patient was recently in the ED on 5/28 due to rash on his left buttock which was concerning for dermatitis and stage I left buttock pressure ulcer.  Patient was offered a prescription for Paxlovid vs Bebtelovimab in ED but family declined due to lack of symptoms.  He began having symptoms with lethargy and weakness on 5/28 and hasnt been out of  bed since and was prescribed molnupiravir on 6/1.  On presentation, he was hypoxic on room air and was placed on 3 L of oxygen per minute.  He was tachypneic, hypertensive, lactate level of 2.5, had WBC of 12, COVID screen test was positive, fibrinogen was more than 800.  Chest x-ray showed new mid mild and lower lung opacities worse than right.  CTA did not show any acute intracranial arteries.  Patient was admitted for the management of acute respiratory failure from COVID pneumonia.  Started on remdesivir, Solu-Medrol and baricitinib. Patient continues to feel better, currently on room air.  If he remains like that, we can consider him discharging to home tomorrow with fourth dose of remdesivir in the morning and oral steroids.  Assessment & Plan:   Principal Problem:   Acute respiratory failure (HCC) Active Problems:   S/P AKA (above knee amputation) bilateral (HCC)   DVT (deep venous thrombosis) (HCC)   Diabetes type 2 with atherosclerosis of arteries of extremities (HCC)   OSA (obstructive sleep apnea)   COVID-19   Acute metabolic encephalopathy   Acute hypoxic respite failure  secondary to COVID pneumonia: Unvaccinated patient. Required  2 L of oxygen per minute on presentation.  On room air today.  Denies any worsening shortness of breath.  Complains of some cough.  Does not need oxygen at home.  Elevated inflammatory markers including CRP, now improving.  Started on steroids, remdesivir day 3/5, baricitinib.  Continue supplemental oxygen as needed, bronchodilators, flutter valve, incentive spirometry.  .  Frequent prone positioning if deteriorates or hypoxic.  COPD exacerbation: Most likely secondary to COVID.  Continue current treatment  Acute metabolic encephalopathy: Secondary to COVID infection and hypoxia.  Continue monitor mental status.  Currently alert and oriented.  Lactic acidosis: Most likely secondary to dehydration.  Resolved  Supratherapeutic INR/history of DVT: On warfarin at home. INR was 8.1 on presentation,and was given vitamin K.  INR is therapeutic now.  Warfarin resumed  Type II diabetes mellitus: Takes glimepiride at home.  Currently on sliding scale insulin.  Monitor blood sugars while on steroids.  Hypertensive urgency: Currently blood pressure stable.  Continue.  Medications for severe hypertension  Hyperlipidemia/history of peripheral vascular disease: Status post bilateral AKA.  Continue statin  OSA: Unable to use CPAP due to COVID.  Chronic pain syndrome: Continue supportive care, minimize sedatives/narcotics  Goals of care: 85 year old male with multiple comorbidities including pneumonia from COVID.  Remains full code.            DVT prophylaxis:None today Code Status:Full  Family Communication: Daughter at bedside on 06/07/20.Called again today,call not received at [phone] Status is: Inpatient  Remains inpatient appropriate because:Inpatient level of care appropriate due to  severity of illness   Dispo: The patient is from: Home              Anticipated d/c is to: Home              Patient currently is not medically  stable to d/c.   Difficult to place patient No    Consultants: None  Procedures:None  Antimicrobials:  Anti-infectives (From admission, onward)   Start     Dose/Rate Route Frequency Ordered Stop   06/07/20 1000  remdesivir 100 mg in sodium chloride 0.9 % 100 mL IVPB       "Followed by" Linked Group Details   100 mg 200 mL/hr over 30 Minutes Intravenous Every 24 hours 06/06/20 0858 06/11/20 0959   06/07/20 1000  remdesivir 100 mg in sodium chloride 0.9 % 100 mL IVPB  Status:  Discontinued       "Followed by" Linked Group Details   100 mg 200 mL/hr over 30 Minutes Intravenous Daily 06/06/20 1124 06/06/20 1129   06/06/20 1130  remdesivir 200 mg in sodium chloride 0.9% 250 mL IVPB  Status:  Discontinued       "Followed by" Linked Group Details   200 mg 580 mL/hr over 30 Minutes Intravenous Once 06/06/20 1124 06/06/20 1129   06/06/20 0930  remdesivir 200 mg in sodium chloride 0.9% 250 mL IVPB       "Followed by" Linked Group Details   200 mg 580 mL/hr over 30 Minutes Intravenous Once 06/06/20 0858 06/06/20 1100      Subjective:  Patient seen and examined at the bedside this morning.  Currently on room air.  Still having some cough.  Eager to go home.  Looks much better than yesterday.  Denies any new complaints.  Objective: Vitals:   06/07/20 1245 06/07/20 1954 06/08/20 0534 06/08/20 0735  BP: (!) 164/67 (!) 159/83 (!) 146/67   Pulse: 68 84 76   Resp: 18 (!) 22 20   Temp: 98.7 F (37.1 C) 98.4 F (36.9 C) 98.3 F (36.8 C)   TempSrc: Oral Oral Oral   SpO2: 96% 93% 93% 93%  Weight:      Height:        Intake/Output Summary (Last 24 hours) at 06/08/2020 0814 Last data filed at 06/08/2020 0500 Gross per 24 hour  Intake 520 ml  Output 1025 ml  Net -505 ml   Filed Weights   06/06/20 1431  Weight: 89.5 kg    Examination:  General exam: Overall comfortable, not in distress, obese HEENT: PERRL Respiratory system: Diminished air sounds bilaterally, no wheezes or  crackles  Cardiovascular system: S1 & S2 heard, RRR.  Gastrointestinal system: Abdomen is nondistended, soft and nontender. Central nervous system: Alert and oriented Extremities: B/L AKA,No edema, no clubbing ,no cyanosis Skin: No rashes, no ulcers,no icterus    Data Reviewed: I have personally reviewed following labs and imaging studies  CBC: Recent Labs  Lab 06/01/20 1058 06/06/20 0736 06/07/20 0323 06/08/20 0316  WBC 7.8 12.0* 8.8 12.5*  NEUTROABS 6.4 10.0* 8.0* 10.7*  HGB 16.8 15.6 14.3 14.6  HCT 52.7* 49.8 46.5 46.7  MCV 93.1 93.3 95.1 94.2  PLT 256 285 295 340   Basic Metabolic Panel: Recent Labs  Lab 06/01/20 1058 06/06/20 0736 06/07/20 0323 06/08/20 0316  NA 138 133* 136 135  K 2.9* 4.3 4.3 3.5  CL 93* 95* 99 95*  CO2 31 27 27 30   GLUCOSE 110* 155* 181* 106*  BUN 13 9 17  16  CREATININE 0.86 0.55* 0.69 0.55*  CALCIUM 8.8* 8.3* 8.5* 8.3*  MG 2.4  --   --   --    GFR: Estimated Creatinine Clearance: 48.7 mL/min (A) (by C-G formula based on SCr of 0.55 mg/dL (L)). Liver Function Tests: Recent Labs  Lab 06/01/20 1058 06/06/20 0736 06/07/20 0323 06/08/20 0316  AST 32 36 31 34  ALT 14 17 18 23   ALKPHOS 48 51 44 37*  BILITOT 0.4 1.0 0.8 0.7  PROT 8.7* 8.1 7.3 7.0  ALBUMIN 3.8 3.2* 3.0* 2.9*   No results for input(s): LIPASE, AMYLASE in the last 168 hours. No results for input(s): AMMONIA in the last 168 hours. Coagulation Profile: Recent Labs  Lab 06/06/20 0736 06/07/20 0323 06/08/20 0316  INR 8.1* 2.0* 1.3*   Cardiac Enzymes: No results for input(s): CKTOTAL, CKMB, CKMBINDEX, TROPONINI in the last 168 hours. BNP (last 3 results) No results for input(s): PROBNP in the last 8760 hours. HbA1C: No results for input(s): HGBA1C in the last 72 hours. CBG: Recent Labs  Lab 06/07/20 0727 06/07/20 1142 06/07/20 1619 06/07/20 1948 06/08/20 0736  GLUCAP 161* 151* 153* 153* 87   Lipid Profile: Recent Labs    06/06/20 0736  TRIG 83    Thyroid Function Tests: No results for input(s): TSH, T4TOTAL, FREET4, T3FREE, THYROIDAB in the last 72 hours. Anemia Panel: Recent Labs    06/07/20 0323 06/08/20 0316  FERRITIN 175 225   Sepsis Labs: Recent Labs  Lab 06/06/20 0736 06/06/20 0954  PROCALCITON <0.10  --   LATICACIDVEN 2.5* 1.8    Recent Results (from the past 240 hour(s))  Resp Panel by RT-PCR (Flu A&B, Covid) Nasopharyngeal Swab     Status: Abnormal   Collection Time: 06/01/20 10:58 AM   Specimen: Nasopharyngeal Swab; Nasopharyngeal(NP) swabs in vial transport medium  Result Value Ref Range Status   SARS Coronavirus 2 by RT PCR POSITIVE (A) NEGATIVE Final    Comment: RESULT CALLED TO, READ BACK BY AND VERIFIED WITH: SAVOIE,B. RN AT 1308 06/01/20 MULLINS,T (NOTE) SARS-CoV-2 target nucleic acids are DETECTED.  The SARS-CoV-2 RNA is generally detectable in upper respiratory specimens during the acute phase of infection. Positive results are indicative of the presence of the identified virus, but do not rule out bacterial infection or co-infection with other pathogens not detected by the test. Clinical correlation with patient history and other diagnostic information is necessary to determine patient infection status. The expected result is Negative.  Fact Sheet for Patients: 06/03/20  Fact Sheet for Healthcare Providers: BloggerCourse.com  This test is not yet approved or cleared by the [place] FDA and  has been authorized for detection and/or diagnosis of SARS-CoV-2 by FDA under an Emergency Use Authorization (EUA).  This EUA will remain in effect (meaning this test  can be used) for the duration of  the COVID-19 declaration under Section 564(b)(1) of the Act, 21 U.S.C. section 360bbb-3(b)(1), unless the authorization is terminated or revoked sooner.     Influenza A by PCR NEGATIVE NEGATIVE Final   Influenza B by PCR NEGATIVE NEGATIVE  Final    Comment: (NOTE) The Xpert Xpress SARS-CoV-2/FLU/RSV plus assay is intended as an aid in the diagnosis of influenza from Nasopharyngeal swab specimens and should not be used as a sole basis for treatment. Nasal washings and aspirates are unacceptable for Xpert Xpress SARS-CoV-2/FLU/RSV testing.  Fact Sheet for Patients: [place]  Fact Sheet for Healthcare Providers: BloggerCourse.com  This test is not yet approved or cleared  by the [place] and has been authorized for detection and/or diagnosis of SARS-CoV-2 by FDA under an Emergency Use Authorization (EUA). This EUA will remain in effect (meaning this test can be used) for the duration of the COVID-19 declaration under Section 564(b)(1) of the Act, 21 U.S.C. section 360bbb-3(b)(1), unless the authorization is terminated or revoked.  Performed at [facility], [address], [place], [place] [address]   Blood Culture (routine x 2)     Status: None (Preliminary result)   Collection Time: 06/06/20  7:36 AM   Specimen: BLOOD  Result Value Ref Range Status   Specimen Description   Final    BLOOD LEFT ANTECUBITAL Performed at [facility], [address], [place], [place] [address]    Special Requests   Final    BOTTLES DRAWN AEROBIC AND ANAEROBIC Blood Culture results may not be optimal due to an inadequate volume of blood received in culture bottles Performed at [facility], [address], [place], [place] [address]    Culture   Final    NO GROWTH < 24 HOURS Performed at [facility], [address] [place], [place] [address]    Report Status PENDING  Incomplete  Blood Culture (routine x 2)     Status: None (Preliminary result)   Collection Time: 06/06/20  7:36 AM   Specimen: BLOOD  Result Value Ref Range Status   Specimen Description   Final    BLOOD RIGHT ANTECUBITAL Performed at  [facility], [address] [place], [place] [address]    Special Requests   Final    BOTTLES DRAWN AEROBIC AND ANAEROBIC Blood Culture results may not be optimal due to an inadequate volume of blood received in culture bottles Performed at [facility], [address] [place], [place] [address]    Culture   Final    NO GROWTH < 24 HOURS Performed at [facility], [address] [place], [place] [address]    Report Status PENDING  Incomplete  Resp Panel by RT-PCR (Flu A&B, Covid) Nasopharyngeal Swab     Status: Abnormal   Collection Time: 06/06/20  7:45 AM   Specimen: Nasopharyngeal Swab; Nasopharyngeal(NP) swabs in vial transport medium  Result Value Ref Range Status   SARS Coronavirus 2 by RT PCR POSITIVE (A) NEGATIVE Final    Comment: CRITICAL RESULT CALLED TO, READ BACK BY AND VERIFIED WITH: SOVOIE B. ON  06/06/2020 @ 0917 BY MECIAL J (NOTE) SARS-CoV-2 target nucleic acids are DETECTED.  The SARS-CoV-2 RNA is generally detectable in upper respiratory specimens during the acute phase of infection. Positive results are indicative of the presence of the identified virus, but do not rule out bacterial infection or co-infection with other pathogens not detected by the test. Clinical correlation with patient history and other diagnostic information is necessary to determine patient infection status. The expected result is Negative.  Fact Sheet for Patients: BloggerCourse.com  Fact Sheet for Healthcare Providers: SeriousBroker.it  This test is not yet approved or cleared by the [place] FDA and  has been authorized for detection and/or diagnosis of SARS-CoV-2 by FDA under an Emergency Use Authorization (EUA).  This EUA will remain in effect (meani ng this test can be used) for the duration of  the COVID-19 declaration under Section 564(b)(1) of the Act, 21 U.S.C. section  360bbb-3(b)(1), unless the authorization is terminated or revoked sooner.     Influenza A  by PCR NEGATIVE NEGATIVE Final   Influenza B by PCR NEGATIVE NEGATIVE Final    Comment: (NOTE) The Xpert Xpress SARS-CoV-2/FLU/RSV plus assay is intended as an aid in the diagnosis of influenza from Nasopharyngeal swab specimens and should not be used as a sole basis for treatment. Nasal washings and aspirates are unacceptable for Xpert Xpress SARS-CoV-2/FLU/RSV testing.  Fact Sheet for Patients: BloggerCourse.comhttps://www.fda.gov/media/152166/download  Fact Sheet for Healthcare Providers: SeriousBroker.ithttps://www.fda.gov/media/152162/download  This test is not yet approved or cleared by the [place] FDA and has been authorized for detection and/or diagnosis of SARS-CoV-2 by FDA under an Emergency Use Authorization (EUA). This EUA will remain in effect (meaning this test can be used) for the duration of the COVID-19 declaration under Section 564(b)(1) of the Act, 21 U.S.C. section 360bbb-3(b)(1), unless the authorization is terminated or revoked.  Performed at [facility], [address] [place], [place] [address]          Radiology Studies: CT Head Wo Contrast  Result Date: 06/06/2020 CLINICAL DATA:  Delirium. EXAM: CT HEAD WITHOUT CONTRAST TECHNIQUE: Contiguous axial images were obtained from the base of the skull through the vertex without intravenous contrast. COMPARISON:  None. FINDINGS: Motion limited study.  Within this limitation: Brain: No evidence of acute large vascular territory infarction, hemorrhage, hydrocephalus, extra-axial collection or mass effect. Multiple areas of ossification along the left convexity, measuring up to 5 mm in thickness convexity (series 2, image 53). Moderate generalized cerebral volume loss. Mild patchy white matter hypoattenuation, most likely related to chronic microvascular ischemic disease. Vascular: No hyperdense vessel identified. Calcific  intracranial atherosclerosis. Skull: No acute fracture. Sinuses/Orbits: Marked ethmoid air cell mucosal thickening with frothy secretions. Moderate left and mild right frontal sinus mucosal thickening with left frontoethmoidal frothy secretion/opacification. Near complete opacification of the right maxillary sinus and left maxillary sinus mucosal thickening. Bilateral sphenoid air-fluid levels. Other: No mastoid effusions. IMPRESSION: 1. No evidence of acute intracranial abnormality on this motion limited study. 2. Moderate cerebral atrophy and mild chronic microvascular ischemic disease. 3. Significant paranasal sinus disease, detailed above and suggestive of sinusitis. 4. Multiple areas of ossification along the left convexity (measuring up to 5 mm in thickness) that are favored to reflect dural ossification over ossified meningiomas. No significant mass effect. Electronically Signed   By: Feliberto HartsFrederick S Jones MD   On: 06/06/2020 10:48        Scheduled Meds: . baricitinib  4 mg Oral Daily  . donepezil  10 mg Oral QHS  . DULoxetine  30 mg Oral Daily  . fentaNYL  1 patch Transdermal Q72H  . gabapentin  200 mg Oral TID  . insulin aspart  0-15 Units Subcutaneous TID WC  . insulin aspart  0-5 Units Subcutaneous QHS  . insulin detemir  0.1 Units/kg Subcutaneous BID  . Ipratropium-Albuterol  1 puff Inhalation Q6H  . linagliptin  5 mg Oral Daily  . mouth rinse  15 mL Mouth Rinse BID  . methylPREDNISolone (SOLU-MEDROL) injection  40 mg Intravenous Q12H   Followed by  . [START ON 06/10/2020] predniSONE  50 mg Oral Daily  . mometasone-formoterol  2 puff Inhalation BID  . simvastatin  40 mg Oral QPM  . sodium chloride flush  3 mL Intravenous Q12H  . tamsulosin  0.4 mg Oral Daily  . Warfarin - Pharmacist Dosing Inpatient   Does not apply q1600   Continuous Infusions: . remdesivir 100 mg in NS 100 mL 100 mg (06/07/20 0956)     LOS: 2  days    Time spent: 35 mins.More than 50% of that time was spent  in counseling and/or coordination of care.      Burnadette Pop, MD [facility] P6/04/2020, 8:14 AM

## 2020-06-08 NOTE — Plan of Care (Signed)
Patient maintains oxygen saturation in the low 90's on room air.  C/o left stump pain, improved with pain med and Neurontin.  Daughter at bedside.

## 2020-06-08 NOTE — Progress Notes (Signed)
ANTICOAGULATION CONSULT NOTE   Pharmacy Consult for warfarin Indication:hx DVT  Allergies  Allergen Reactions  . Ace Inhibitors     hypotension  . Dilaudid [Hydromorphone Hcl]     "Mean"  . Metformin And Related     GI upset  . Morphine And Related Other (See Comments)    "mean"  . Pletal [Cilostazol]     SOB    Vital Signs: Temp: 98.3 F (36.8 C) (06/04 0534) Temp Source: Oral (06/04 0534) BP: 146/67 (06/04 0534) Pulse Rate: 76 (06/04 0534)  Labs: Recent Labs    06/06/20 0736 06/07/20 0323 06/08/20 0316  HGB 15.6 14.3 14.6  HCT 49.8 46.5 46.7  PLT 285 295 340  LABPROT 67.6* 22.4* 16.3*  INR 8.1* 2.0* 1.3*  CREATININE 0.55* 0.69 0.55*    Estimated Creatinine Clearance: 48.7 mL/min (A) (by C-G formula based on SCr of 0.55 mg/dL (L)).   Medical History: Past Medical History:  Diagnosis Date  . Anxiety   . Avascular necrosis (HCC)   . BPH (benign prostatic hyperplasia)   . Depression   . Diabetes mellitus   . DVT (deep venous thrombosis) (HCC)   . GERD (gastroesophageal reflux disease)   . History of leg amputation (HCC)    both legs  . Hypertension   . Neuropathy   . Peripheral vascular disease (HCC)   . Sleep apnea     Medications:  - PTA warfarin regimen: 4mg  daily except 2mg  on MWF (last dose taken on 6/1)  Assessment: Patient is an 85 y.o M with hx DVT on warfarin PTA, who tested positive for COVID on 5/28, presented to the ED on 6/2 with c/o SOB.  Pharmacy consulted to dose warfarin.  06/06/20 INR supra-therapeutic at 8.1, vitamin K 2.5mg  po x 1 administered and warfarin dose held. 06/07/20 INR therapeutic at 2, warfarin 4 mg x 1 administered  Today, 06/08/2020: - INR SUB-therapeutic at 1.3 likely due to effects from vitamin K and held warfarin dose on 6/2 - CBC ok. No bleeding documented  Goal of Therapy:  INR 2-3 Monitor platelets by anticoagulation protocol: Yes   Plan:  Warfarin 5 mg po x 1 today Monitor daily INR, CBC, s/s  bleeding   Brion Sossamon P. 08/08/2020, PharmD, BCPS Clinical Pharmacist [place] Please utilize Amion for appropriate phone number to reach the unit pharmacist [facility]) 06/08/2020 10:26 AM

## 2020-06-08 NOTE — TOC Progression Note (Signed)
Transition of Care [facility]) - Progression Note    Patient Details  Name: Mark Russell MRN: [id number] Date of Birth: 03/21/1935  Transition of Care [facility]) CM/SW Contact  Geni Bers, RN Phone Number: 06/08/2020, 10:06 AM  Clinical Narrative:     Pt from home and plan to discharge home, lives with his son.   Expected Discharge Plan: Home/Self Care Barriers to Discharge: No Barriers Identified  Expected Discharge Plan and Services Expected Discharge Plan: Home/Self Care       Living arrangements for the past 2 months: Single Family Home                                       Social Determinants of Health (SDOH) Interventions    Readmission Risk Interventions No flowsheet data found.

## 2020-06-08 NOTE — Progress Notes (Signed)
End of Shift:  Patient remains resting in bed. Removed and refused Telemetry monitoring. Pt removed I.V. overnight and refused new placement. Resting comfortably. Call bell in reach. No signs of distress.

## 2020-06-09 LAB — CBC WITH DIFFERENTIAL/PLATELET
Abs Immature Granulocytes: 0.14 10*3/uL — ABNORMAL HIGH (ref 0.00–0.07)
Basophils Absolute: 0 10*3/uL (ref 0.0–0.1)
Basophils Relative: 0 %
Eosinophils Absolute: 0 10*3/uL (ref 0.0–0.5)
Eosinophils Relative: 0 %
HCT: 47.8 % (ref 39.0–52.0)
Hemoglobin: 15 g/dL (ref 13.0–17.0)
Immature Granulocytes: 1 %
Lymphocytes Relative: 5 %
Lymphs Abs: 0.6 10*3/uL — ABNORMAL LOW (ref 0.7–4.0)
MCH: 29.6 pg (ref 26.0–34.0)
MCHC: 31.4 g/dL (ref 30.0–36.0)
MCV: 94.5 fL (ref 80.0–100.0)
Monocytes Absolute: 1.6 10*3/uL — ABNORMAL HIGH (ref 0.1–1.0)
Monocytes Relative: 12 %
Neutro Abs: 10.2 10*3/uL — ABNORMAL HIGH (ref 1.7–7.7)
Neutrophils Relative %: 82 %
Platelets: 375 10*3/uL (ref 150–400)
RBC: 5.06 MIL/uL (ref 4.22–5.81)
RDW: 14.8 % (ref 11.5–15.5)
WBC: 12.6 10*3/uL — ABNORMAL HIGH (ref 4.0–10.5)
nRBC: 0 % (ref 0.0–0.2)

## 2020-06-09 LAB — COMPREHENSIVE METABOLIC PANEL
ALT: 28 U/L (ref 0–44)
AST: 38 U/L (ref 15–41)
Albumin: 3 g/dL — ABNORMAL LOW (ref 3.5–5.0)
Alkaline Phosphatase: 40 U/L (ref 38–126)
Anion gap: 9 (ref 5–15)
BUN: 15 mg/dL (ref 8–23)
CO2: 33 mmol/L — ABNORMAL HIGH (ref 22–32)
Calcium: 8.5 mg/dL — ABNORMAL LOW (ref 8.9–10.3)
Chloride: 94 mmol/L — ABNORMAL LOW (ref 98–111)
Creatinine, Ser: 0.67 mg/dL (ref 0.61–1.24)
GFR, Estimated: >60 mL/min (ref >60)
Glucose, Bld: 84 mg/dL (ref 70–99)
Potassium: 3.8 mmol/L (ref 3.5–5.1)
Sodium: 136 mmol/L (ref 135–145)
Total Bilirubin: 0.7 mg/dL (ref 0.3–1.2)
Total Protein: 7.4 g/dL (ref 6.5–8.1)

## 2020-06-09 LAB — PROTIME-INR
INR: 1.8 — ABNORMAL HIGH (ref 0.8–1.2)
Prothrombin Time: 21.3 seconds — ABNORMAL HIGH (ref 11.4–15.2)

## 2020-06-09 LAB — GLUCOSE, CAPILLARY
Glucose-Capillary: 94 mg/dL (ref 70–99)
Glucose-Capillary: 124 mg/dL — ABNORMAL HIGH (ref 70–99)
Glucose-Capillary: 191 mg/dL — ABNORMAL HIGH (ref 70–99)
Glucose-Capillary: 206 mg/dL — ABNORMAL HIGH (ref 70–99)

## 2020-06-09 LAB — D-DIMER, QUANTITATIVE: D-Dimer, Quant: 1.43 ug/mL-FEU — ABNORMAL HIGH (ref 0.00–0.50)

## 2020-06-09 LAB — C-REACTIVE PROTEIN: CRP: 3.8 mg/dL — ABNORMAL HIGH (ref <1.0)

## 2020-06-09 LAB — FERRITIN: Ferritin: 153 ng/mL (ref 24–336)

## 2020-06-09 MED ORDER — WARFARIN SODIUM 5 MG PO TABS
5.0000 mg | ORAL_TABLET | Freq: Once | ORAL | Status: AC
  Administered 2020-06-09: 5 mg via ORAL
  Filled 2020-06-09: qty 1

## 2020-06-09 MED ORDER — METHYLPREDNISOLONE SODIUM SUCC 125 MG IJ SOLR
60.0000 mg | Freq: Three times a day (TID) | INTRAMUSCULAR | Status: DC
  Administered 2020-06-09 – 2020-06-10 (×4): 60 mg via INTRAVENOUS
  Filled 2020-06-09 (×4): qty 2

## 2020-06-09 NOTE — Progress Notes (Signed)
PROGRESS NOTE    Mark Russell  EXH:[id number] DOB: Nov 01, 1935 DOA: 06/06/2020 PCP: Maurice Small, MD   Chief Complain: Shortness of breath.  Brief Narrative:  Patient is 85 year old male, unvaccinated against COVID, past medical history of OSA, COPD, type 2 diabetes, peripheral vascular disease status post AKA bilaterally, avascular necrosis, DVT on warfarin, hypertension, recently diagnosed with COVID on 5/22 presented to ED with confusion, shortness of breath.Patient was recently in the ED on 5/28 due to rash on his left buttock which was concerning for dermatitis and stage I left buttock pressure ulcer.  Patient was offered a prescription for Paxlovid vs Bebtelovimab in ED but family declined due to lack of symptoms.  He began having symptoms with lethargy and weakness on 5/28 and hasnt been out of  bed since and was prescribed molnupiravir on 6/1.  On presentation, he was hypoxic on room air and was placed on 3 L of oxygen per minute.  He was tachypneic, hypertensive, lactate level of 2.5, had WBC of 12, COVID screen test was positive, fibrinogen was more than 800.  Chest x-ray showed new mid mild and lower lung opacities worse than right.  CTA did not show any acute intracranial arteries.  Patient was admitted for the management of acute respiratory failure from COVID pneumonia.  Started on remdesivir, Solu-Medrol and baricitinib.  Assessment & Plan:   Principal Problem:   Acute respiratory failure (HCC) Active Problems:   S/P AKA (above knee amputation) bilateral (HCC)   DVT (deep venous thrombosis) (HCC)   Diabetes type 2 with atherosclerosis of arteries of extremities (HCC)   OSA (obstructive sleep apnea)   COVID-19   Acute metabolic encephalopathy   Acute hypoxic respite failure secondary to COVID pneumonia: Unvaccinated patient. Required  2 L of oxygen per minute on presentation.  On room air today.  Denies any worsening shortness of breath.  Complains of some cough but is  wheezing today. does not need oxygen at home.  Elevated inflammatory markers including CRP, now improving.  Started on steroids, remdesivir day 4/5, baricitinib.  Increased the frequency of steroids.  Continue supplemental oxygen as needed, bronchodilators, flutter valve, incentive spirometry.  .  Frequent prone positioning if deteriorates or hypoxic.  COPD exacerbation: Most likely secondary to COVID.  Continue current treatment  Acute metabolic encephalopathy: Secondary to COVID infection and hypoxia.  Continue monitor mental status.  Currently alert and oriented.  Lactic acidosis: Most likely secondary to dehydration.  Resolved  Supratherapeutic INR/history of DVT: On warfarin at home. INR was 8.1 on presentation,and was given vitamin K.  INR is therapeutic now.  Warfarin resumed  Type II diabetes mellitus: Takes glimepiride at home.  Currently on sliding scale insulin.  Monitor blood sugars while on steroids.  Hypertensive urgency: Currently blood pressure stable.  Continue.  Medications for severe hypertension  Hyperlipidemia/history of peripheral vascular disease: Status post bilateral AKA.  Continue statin  OSA: Unable to use CPAP due to COVID.  Chronic pain syndrome: Continue supportive care, minimize sedatives/narcotics  Goals of care: 85 year old male with multiple comorbidities including pneumonia from COVID.  Remains full code.  Debility/deconditioning: Patient lives alone, uses wheelchair for ambulation.  PT/OT consulted.          DVT prophylaxis:warfarin Code Status:Full  Family Communication:  Discussed with son at the bedside on 06/09/2020 Status is: Inpatient  Remains inpatient appropriate because:Inpatient level of care appropriate due to severity of illness   Dispo: The patient is from: Home  Anticipated d/c is to: Home              Patient currently is not medically stable to d/c.   Difficult to place patient No    Consultants:  None  Procedures:None  Antimicrobials:  Anti-infectives (From admission, onward)   Start     Dose/Rate Route Frequency Ordered Stop   06/07/20 1000  remdesivir 100 mg in sodium chloride 0.9 % 100 mL IVPB       "Followed by" Linked Group Details   100 mg 200 mL/hr over 30 Minutes Intravenous Every 24 hours 06/06/20 0858 06/11/20 0959   06/07/20 1000  remdesivir 100 mg in sodium chloride 0.9 % 100 mL IVPB  Status:  Discontinued       "Followed by" Linked Group Details   100 mg 200 mL/hr over 30 Minutes Intravenous Daily 06/06/20 1124 06/06/20 1129   06/06/20 1130  remdesivir 200 mg in sodium chloride 0.9% 250 mL IVPB  Status:  Discontinued       "Followed by" Linked Group Details   200 mg 580 mL/hr over 30 Minutes Intravenous Once 06/06/20 1124 06/06/20 1129   06/06/20 0930  remdesivir 200 mg in sodium chloride 0.9% 250 mL IVPB       "Followed by" Linked Group Details   200 mg 580 mL/hr over 30 Minutes Intravenous Once 06/06/20 0858 06/06/20 1100      Subjective:  Patient seen and examined the bedside this morning.  Hemodynamically stable.  On room air.  But has severe expiratory bilateral wheezes.  Discharge canceled.  Increased the frequency of steroids  Objective: Vitals:   06/08/20 0534 06/08/20 0735 06/08/20 1237 06/08/20 2004  BP: (!) 146/67  (!) 155/79 (!) 165/87  Pulse: 76  75 61  Resp: 20   18  Temp: 98.3 F (36.8 C)  98.1 F (36.7 C) 98.2 F (36.8 C)  TempSrc: Oral  Oral Oral  SpO2: 93% 93% 94% 95%  Weight:      Height:        Intake/Output Summary (Last 24 hours) at 06/09/2020 1106 Last data filed at 06/09/2020 1000 Gross per 24 hour  Intake 240 ml  Output 425 ml  Net -185 ml   Filed Weights   06/06/20 1431  Weight: 89.5 kg    Examination:  General exam: Overall comfortable, not in distress HEENT: PERRL Respiratory system: Bilateral expiratory wheezes Cardiovascular system: S1 & S2 heard, RRR.  Gastrointestinal system: Abdomen is nondistended, soft  and nontender. Central nervous system: Alert and oriented Extremities: Bilateral AKA Skin: No rashes, no ulcers,no icterus   Data Reviewed: I have personally reviewed following labs and imaging studies  CBC: Recent Labs  Lab 06/06/20 0736 06/07/20 0323 06/08/20 0316 06/09/20 0802  WBC 12.0* 8.8 12.5* 12.6*  NEUTROABS 10.0* 8.0* 10.7* 10.2*  HGB 15.6 14.3 14.6 15.0  HCT 49.8 46.5 46.7 47.8  MCV 93.3 95.1 94.2 94.5  PLT 285 295 340 375   Basic Metabolic Panel: Recent Labs  Lab 06/06/20 0736 06/07/20 0323 06/08/20 0316 06/09/20 0802  NA 133* 136 135 136  K 4.3 4.3 3.5 3.8  CL 95* 99 95* 94*  CO2 27 27 30  33*  GLUCOSE 155* 181* 106* 84  BUN 9 17 16 15   CREATININE 0.55* 0.69 0.55* 0.67  CALCIUM 8.3* 8.5* 8.3* 8.5*   GFR: Estimated Creatinine Clearance: 48.7 mL/min (by C-G formula based on SCr of 0.67 mg/dL). Liver Function Tests: Recent Labs  Lab 06/06/20 0736 06/07/20 0323 06/08/20  4010 06/09/20 0802  AST 36 31 34 38  ALT 17 18 23 28   ALKPHOS 51 44 37* 40  BILITOT 1.0 0.8 0.7 0.7  PROT 8.1 7.3 7.0 7.4  ALBUMIN 3.2* 3.0* 2.9* 3.0*   No results for input(s): LIPASE, AMYLASE in the last 168 hours. No results for input(s): AMMONIA in the last 168 hours. Coagulation Profile: Recent Labs  Lab 06/06/20 0736 06/07/20 0323 06/08/20 0316  INR 8.1* 2.0* 1.3*   Cardiac Enzymes: No results for input(s): CKTOTAL, CKMB, CKMBINDEX, TROPONINI in the last 168 hours. BNP (last 3 results) No results for input(s): PROBNP in the last 8760 hours. HbA1C: No results for input(s): HGBA1C in the last 72 hours. CBG: Recent Labs  Lab 06/08/20 0736 06/08/20 1224 06/08/20 1656 06/08/20 2012 06/09/20 0913  GLUCAP 87 161* 210* 85 94   Lipid Profile: No results for input(s): CHOL, HDL, LDLCALC, TRIG, CHOLHDL, LDLDIRECT in the last 72 hours. Thyroid Function Tests: No results for input(s): TSH, T4TOTAL, FREET4, T3FREE, THYROIDAB in the last 72 hours. Anemia Panel: Recent  Labs    06/08/20 0316 06/09/20 0802  FERRITIN 225 153   Sepsis Labs: Recent Labs  Lab 06/06/20 0736 06/06/20 0954  PROCALCITON <0.10  --   LATICACIDVEN 2.5* 1.8    Recent Results (from the past 240 hour(s))  Resp Panel by RT-PCR (Flu A&B, Covid) Nasopharyngeal Swab     Status: Abnormal   Collection Time: 06/01/20 10:58 AM   Specimen: Nasopharyngeal Swab; Nasopharyngeal(NP) swabs in vial transport medium  Result Value Ref Range Status   SARS Coronavirus 2 by RT PCR POSITIVE (A) NEGATIVE Final    Comment: RESULT CALLED TO, READ BACK BY AND VERIFIED WITH: SAVOIE,B. RN AT 1308 06/01/20 MULLINS,T (NOTE) SARS-CoV-2 target nucleic acids are DETECTED.  The SARS-CoV-2 RNA is generally detectable in upper respiratory specimens during the acute phase of infection. Positive results are indicative of the presence of the identified virus, but do not rule out bacterial infection or co-infection with other pathogens not detected by the test. Clinical correlation with patient history and other diagnostic information is necessary to determine patient infection status. The expected result is Negative.  Fact Sheet for Patients: 06/03/20  Fact Sheet for Healthcare Providers: BloggerCourse.com  This test is not yet approved or cleared by the [place] FDA and  has been authorized for detection and/or diagnosis of SARS-CoV-2 by FDA under an Emergency Use Authorization (EUA).  This EUA will remain in effect (meaning this test  can be used) for the duration of  the COVID-19 declaration under Section 564(b)(1) of the Act, 21 U.S.C. section 360bbb-3(b)(1), unless the authorization is terminated or revoked sooner.     Influenza A by PCR NEGATIVE NEGATIVE Final   Influenza B by PCR NEGATIVE NEGATIVE Final    Comment: (NOTE) The Xpert Xpress SARS-CoV-2/FLU/RSV plus assay is intended as an aid in the diagnosis of influenza from  Nasopharyngeal swab specimens and should not be used as a sole basis for treatment. Nasal washings and aspirates are unacceptable for Xpert Xpress SARS-CoV-2/FLU/RSV testing.  Fact Sheet for Patients: [place]  Fact Sheet for Healthcare Providers: BloggerCourse.com  This test is not yet approved or cleared by the [place] FDA and has been authorized for detection and/or diagnosis of SARS-CoV-2 by FDA under an Emergency Use Authorization (EUA). This EUA will remain in effect (meaning this test can be used) for the duration of the COVID-19 declaration under Section 564(b)(1) of the Act, 21 U.S.C. section 360bbb-3(b)(1), unless  the authorization is terminated or revoked.  Performed at [facility], [address] [place], [place] [address]   Blood Culture (routine x 2)     Status: None (Preliminary result)   Collection Time: 06/06/20  7:36 AM   Specimen: BLOOD  Result Value Ref Range Status   Specimen Description   Final    BLOOD LEFT ANTECUBITAL Performed at [facility], [address] [place], [place] [address]    Special Requests   Final    BOTTLES DRAWN AEROBIC AND ANAEROBIC Blood Culture results may not be optimal due to an inadequate volume of blood received in culture bottles Performed at [facility], [address], [place], [place] [address]    Culture   Final    NO GROWTH 2 DAYS Performed at [facility], [address] [place], [place] [address]    Report Status PENDING  Incomplete  Blood Culture (routine x 2)     Status: None (Preliminary result)   Collection Time: 06/06/20  7:36 AM   Specimen: BLOOD  Result Value Ref Range Status   Specimen Description   Final    BLOOD RIGHT ANTECUBITAL Performed at [facility], [address], [place], [place] [address]    Special Requests   Final    BOTTLES DRAWN AEROBIC AND  ANAEROBIC Blood Culture results may not be optimal due to an inadequate volume of blood received in culture bottles Performed at [facility], [address] [place], [place] [address]    Culture   Final    NO GROWTH 2 DAYS Performed at [facility], [address], [place], [place] [address]    Report Status PENDING  Incomplete  Resp Panel by RT-PCR (Flu A&B, Covid) Nasopharyngeal Swab     Status: Abnormal   Collection Time: 06/06/20  7:45 AM   Specimen: Nasopharyngeal Swab; Nasopharyngeal(NP) swabs in vial transport medium  Result Value Ref Range Status   SARS Coronavirus 2 by RT PCR POSITIVE (A) NEGATIVE Final    Comment: CRITICAL RESULT CALLED TO, READ BACK BY AND VERIFIED WITH: SOVOIE B. ON  06/06/2020 @ 0917 BY MECIAL J (NOTE) SARS-CoV-2 target nucleic acids are DETECTED.  The SARS-CoV-2 RNA is generally detectable in upper respiratory specimens during the acute phase of infection. Positive results are indicative of the presence of the identified virus, but do not rule out bacterial infection or co-infection with other pathogens not detected by the test. Clinical correlation with patient history and other diagnostic information is necessary to determine patient infection status. The expected result is Negative.  Fact Sheet for Patients: BloggerCourse.com  Fact Sheet for Healthcare Providers: SeriousBroker.it  This test is not yet approved or cleared by the [place] FDA and  has been authorized for detection and/or diagnosis of SARS-CoV-2 by FDA under an Emergency Use Authorization (EUA).  This EUA will remain in effect (meani ng this test can be used) for the duration of  the COVID-19 declaration under Section 564(b)(1) of the Act, 21 U.S.C. section 360bbb-3(b)(1), unless the authorization is terminated or revoked sooner.     Influenza A by PCR NEGATIVE NEGATIVE Final   Influenza B by PCR  NEGATIVE NEGATIVE Final    Comment: (NOTE) The Xpert Xpress SARS-CoV-2/FLU/RSV plus assay is intended as an aid in the diagnosis of influenza from Nasopharyngeal swab specimens and should not be used as a sole basis for treatment. Nasal washings and aspirates are  unacceptable for Xpert Xpress SARS-CoV-2/FLU/RSV testing.  Fact Sheet for Patients: BloggerCourse.comhttps://www.fda.gov/media/152166/download  Fact Sheet for Healthcare Providers: SeriousBroker.ithttps://www.fda.gov/media/152162/download  This test is not yet approved or cleared by the [place] FDA and has been authorized for detection and/or diagnosis of SARS-CoV-2 by FDA under an Emergency Use Authorization (EUA). This EUA will remain in effect (meaning this test can be used) for the duration of the COVID-19 declaration under Section 564(b)(1) of the Act, 21 U.S.C. section 360bbb-3(b)(1), unless the authorization is terminated or revoked.  Performed at [facility], [address] [place], [place] [address]          Radiology Studies: No results found.      Scheduled Meds: . baricitinib  4 mg Oral Daily  . donepezil  10 mg Oral QHS  . DULoxetine  30 mg Oral Daily  . fentaNYL  1 patch Transdermal Q72H  . gabapentin  200 mg Oral TID  . insulin aspart  0-15 Units Subcutaneous TID WC  . insulin aspart  0-5 Units Subcutaneous QHS  . insulin detemir  0.1 Units/kg Subcutaneous BID  . Ipratropium-Albuterol  1 puff Inhalation Q6H  . linagliptin  5 mg Oral Daily  . mouth rinse  15 mL Mouth Rinse BID  . methylPREDNISolone (SOLU-MEDROL) injection  60 mg Intravenous Q8H  . mometasone-formoterol  2 puff Inhalation BID  . simvastatin  40 mg Oral QPM  . sodium chloride flush  3 mL Intravenous Q12H  . tamsulosin  0.4 mg Oral Daily  . Warfarin - Pharmacist Dosing Inpatient   Does not apply q1600   Continuous Infusions: . remdesivir 100 mg in NS 100 mL 100 mg (06/08/20 0934)     LOS: 3 days    Time spent: 35 mins.More  than 50% of that time was spent in counseling and/or coordination of care.      Burnadette PopAmrit Navdeep Halt, MD [facility] P6/05/2020, 11:06 AM

## 2020-06-09 NOTE — Progress Notes (Signed)
ANTICOAGULATION CONSULT NOTE   Pharmacy Consult for warfarin Indication:hx DVT  Allergies  Allergen Reactions  . Ace Inhibitors     hypotension  . Dilaudid [Hydromorphone Hcl]     "Mean"  . Metformin And Related     GI upset  . Morphine And Related Other (See Comments)    "mean"  . Pletal [Cilostazol]     SOB    Vital Signs:    Labs: Recent Labs    06/07/20 0323 06/08/20 0316 06/09/20 0802  HGB 14.3 14.6 15.0  HCT 46.5 46.7 47.8  PLT 295 340 375  LABPROT 22.4* 16.3* 21.3*  INR 2.0* 1.3* 1.8*  CREATININE 0.69 0.55* 0.67    Estimated Creatinine Clearance: 48.7 mL/min (by C-G formula based on SCr of 0.67 mg/dL).   Medical History: Past Medical History:  Diagnosis Date  . Anxiety   . Avascular necrosis (HCC)   . BPH (benign prostatic hyperplasia)   . Depression   . Diabetes mellitus   . DVT (deep venous thrombosis) (HCC)   . GERD (gastroesophageal reflux disease)   . History of leg amputation (HCC)    both legs  . Hypertension   . Neuropathy   . Peripheral vascular disease (HCC)   . Sleep apnea     Medications:  - PTA warfarin regimen: 4mg  daily except 2mg  on MWF (last dose taken on 6/1)  Assessment: Patient is an 85 y.o M with hx DVT on warfarin PTA, who tested positive for COVID on 5/28, presented to the ED on 6/2 with c/o SOB.  Pharmacy consulted to dose warfarin.  06/06/20 INR supra-therapeutic at 8.1, vitamin K 2.5mg  po x 1 administered and warfarin dose held. 06/07/20 INR therapeutic at 2, warfarin 4 mg x 1 administered 06/08/20 INR sub-therapeutic at 1.3, warfarin 5 mg x 1 administered  Today, 06/09/2020: - INR SUB-therapeutic at 1.8 (up 0.5 from yesterday) - CBC ok. No bleeding documented  Goal of Therapy:  INR 2-3 Monitor platelets by anticoagulation protocol: Yes   Plan:  Warfarin 5 mg po x 1 today Monitor daily INR, CBC, s/s bleeding   Merwyn Hodapp P. 08/08/20, PharmD, BCPS Clinical Pharmacist [place] Please utilize Amion for appropriate  phone number to reach the unit pharmacist [facility]) 06/09/2020 1:22 PM

## 2020-06-09 NOTE — Progress Notes (Signed)
Patient is refusing to keep heart monitor on. I entered patient room to reapply heart monitor leads and patient refused and told me to leave him alone. Will notify provider and Tele monitoring.

## 2020-06-09 NOTE — Progress Notes (Signed)
Patient refused labs at 0300.

## 2020-06-09 NOTE — Evaluation (Signed)
Physical Therapy Evaluation Patient Details Name: Mark Russell MRN: [id number] DOB: Oct 15, 1935 Today's Date: 06/09/2020   History of Present Illness  85 yo male admitted with COVID. Hx of bil AKA, DVT, OSA, COPD, DM, PVD, AVN, buttock pressure ulcer.  Clinical Impression  On eval, pt requird Mod A for bed mobility and Min guard assist for bed<>chair transfers. Did not have a transfer board available in room. Pt also reports he has a trapeze bar at home that he uses. Discussed d/c plan-pt plans to return home. He declined HHPT f/u during PT session on today. May need to check with son to ensure there are no home needs at discharge.     Follow Up Recommendations Supervision for mobility/OOB (pt declines HHPT f/u. May need to check with caregiver/son. May benefit from HHPT if pt/family will agree)    Equipment Recommendations  None recommended by PT    Recommendations for Other Services       Precautions / Restrictions Precautions Precautions: Fall Restrictions Weight Bearing Restrictions: No      Mobility  Bed Mobility Overal bed mobility: Needs Assistance Bed Mobility: Supine to Sit;Sit to Supine     Supine to sit: Mod assist;HOB elevated Sit to supine: Min guard   General bed mobility comments: Pt used bedrail as able. UTilized bedpad to aid with scooting, positoning at EOB. Typically uses trapeze bar per pt. Increased time.    Transfers Overall transfer level: Needs assistance   Transfers: Lateral/Scoot Transfers          Lateral/Scoot Transfers: Min guard General transfer comment: Per pt, he typically uses a transfer board. Pt transferred, on residual limbs, into recliner. Pt then requested to return to bed. As he was transferring back to bed, pt had a LOB forward onto bed. He was able to right himself and position back into supine.  Ambulation/Gait             General Gait Details: nonambulatory  Stairs            Wheelchair Mobility    Modified  Rankin (Stroke Patients Only)       Balance Overall balance assessment: Needs assistance           Standing balance-Leahy Scale: Fair                               Pertinent Vitals/Pain Pain Assessment: No/denies pain    Home Living Family/patient expects to be discharged to:: Private residence Living Arrangements: Children Available Help at Discharge: Family Type of Home: House Home Access: Ramped entrance     Home Layout: One level Home Equipment: Tub bench      Prior Function Level of Independence: Independent with assistive device(s)         Comments: Independenet at wc level.     Hand Dominance   Dominant Hand: Right    Extremity/Trunk Assessment   Upper Extremity Assessment Upper Extremity Assessment: Defer to OT evaluation    Lower Extremity Assessment Lower Extremity Assessment:  (bil AKA)    Cervical / Trunk Assessment Cervical / Trunk Assessment: Normal  Communication   Communication: No difficulties  Cognition Arousal/Alertness: Awake/alert Behavior During Therapy: WFL for tasks assessed/performed Overall Cognitive Status: Within Functional Limits for tasks assessed  General Comments      Exercises     Assessment/Plan    PT Assessment Patient needs continued PT services  PT Problem List Decreased mobility;Decreased balance       PT Treatment Interventions DME instruction;Therapeutic exercise;Balance training;Functional mobility training;Therapeutic activities;Patient/family education    PT Goals (Current goals can be found in the Care Plan section)  Acute Rehab PT Goals Patient Stated Goal: homoe PT Goal Formulation: With patient Time For Goal Achievement: 06/23/20 Potential to Achieve Goals: Good    Frequency Min 3X/week   Barriers to discharge        Co-evaluation               AM-PAC PT "6 Clicks" Mobility  Outcome Measure Help needed  turning from your back to your side while in a flat bed without using bedrails?: A Little Help needed moving from lying on your back to sitting on the side of a flat bed without using bedrails?: A Lot Help needed moving to and from a bed to a chair (including a wheelchair)?: A Little Help needed standing up from a chair using your arms (e.g., wheelchair or bedside chair)?: Total Help needed to walk in hospital room?: Total Help needed climbing 3-5 steps with a railing? : Total 6 Click Score: 11    End of Session   Activity Tolerance: Patient tolerated treatment well Patient left: in bed;with call bell/phone within reach;with bed alarm set   PT Visit Diagnosis: Other abnormalities of gait and mobility (R26.89);Muscle weakness (generalized) (M62.81)    Time: 9381-0175 PT Time Calculation (min) (ACUTE ONLY): 18 min   Charges:   PT Evaluation $PT Eval Moderate Complexity: 1 Mod            Faye Ramsay, PT Acute Rehabilitation  Office: [phone] Pager: [phone]

## 2020-06-09 NOTE — Evaluation (Signed)
Occupational Therapy Evaluation Patient Details Name: Mark Russell MRN: [id number] DOB: 08-09-1935 Today's Date: 06/09/2020    History of Present Illness Patient was admitted for the management of acute respiratory failure from COVID pneumonia. Patient is 85 year old male, unvaccinated against COVID, past medical history of OSA, COPD, type 2 diabetes, peripheral vascular disease status post AKA bilaterally, avascular necrosis, DVT on warfarin, hypertension, recently diagnosed with COVID on 5/22 presented to ED with confusion, shortness of breath.Patient was recently in the ED on 5/28 due to rash on his left buttock which was concerning for dermatitis and stage I left buttock pressure ulcer.   Clinical Impression   Patient demonstrates ability to perform bed mobility, chair transfer and ADLs near his baseline. Patient does need some assistance for supine to sit and setup for ADLs due to not being in familiar environment with his own equipment. Patient reports he doesn't feel weak and has no therapy needs and just wants to go home. OT will sign off.    Follow Up Recommendations  No OT follow up    Equipment Recommendations  None recommended by OT    Recommendations for Other Services       Precautions / Restrictions Precautions Precautions: Fall Restrictions Weight Bearing Restrictions: No      Mobility Bed Mobility Overal bed mobility: Modified Independent             General bed mobility comments: Modified independent with use of bed rails. When one bed rail down required mod assist to transfer into sitting. Typically uses trapeze bar at home for bed mobility.    Transfers Overall transfer level: Needs assistance   Transfers: Lateral/Scoot Transfers          Lateral/Scoot Transfers: Min guard General transfer comment: Min guard to scoot into recliner placed on his right. Typically uses transfer board at home. To return to bed - front of chair placed right up against  bed for patient to egress forard. Initally using bed rail to pull himself forward and lift residual limbs to scoot forward. Loss of balance forward onto the bed. Patient able to lift himself up and position himself in bed.    Balance Overall balance assessment: Mild deficits observed, not formally tested                                         ADL either performed or assessed with clinical judgement   ADL Overall ADL's : At baseline                                       General ADL Comments: Patient modified independent at bed or wc level. Demonstrates ability to dress himself at bed level, use of urinal for toileting. Requires set up while in hospital due to not typical environment.     Vision Patient Visual Report: No change from baseline       Perception     Praxis      Pertinent Vitals/Pain Pain Assessment: No/denies pain     Hand Dominance Right   Extremity/Trunk Assessment Upper Extremity Assessment Upper Extremity Assessment: Overall WFL for tasks assessed (has decreased shoulder strength bilaterally. But normal strength otherwise.)   Lower Extremity Assessment Lower Extremity Assessment: Defer to PT evaluation   Cervical / Trunk Assessment Cervical / Trunk Assessment: Normal  Communication Communication Communication: No difficulties   Cognition Arousal/Alertness: Awake/alert Behavior During Therapy: WFL for tasks assessed/performed Overall Cognitive Status: Within Functional Limits for tasks assessed                                     General Comments       Exercises     Shoulder Instructions      Home Living Family/patient expects to be discharged to:: Private residence Living Arrangements: Children Available Help at Discharge: Family Type of Home: House Home Access: Ramped entrance     Home Layout: One level     Bathroom Shower/Tub: Walk-in shower (wc accessible)   Bathroom Toilet:  Handicapped height Bathroom Accessibility: Yes How Accessible: Accessible via wheelchair Home Equipment: Tub bench          Prior Functioning/Environment Level of Independence: Independent with assistive device(s)        Comments: Independenet at wc level.        OT Problem List: Cardiopulmonary status limiting activity      OT Treatment/Interventions:      OT Goals(Current goals can be found in the care plan section) Acute Rehab OT Goals OT Goal Formulation: All assessment and education complete, DC therapy  OT Frequency:     Barriers to D/C:            Co-evaluation              AM-PAC OT "6 Clicks" Daily Activity     Outcome Measure Help from another person eating meals?: None Help from another person taking care of personal grooming?: A Little Help from another person toileting, which includes using toliet, bedpan, or urinal?: A Little Help from another person bathing (including washing, rinsing, drying)?: A Little Help from another person to put on and taking off regular upper body clothing?: A Little Help from another person to put on and taking off regular lower body clothing?: A Little 6 Click Score: 19   End of Session Nurse Communication: Mobility status  Activity Tolerance: Patient tolerated treatment well Patient left: in bed;with call bell/phone within reach;with bed alarm set  OT Visit Diagnosis: Muscle weakness (generalized) (M62.81)                Time: 9357-0177 OT Time Calculation (min): 18 min Charges:  OT General Charges $OT Visit: 1 Visit OT Evaluation $OT Eval Low Complexity: 1 Low  Stephaine Breshears, OTR/L Acute Care Rehab Services  Office [phone] Pager: [phone]   Kelli Churn 06/09/2020, 12:25 PM

## 2020-06-10 LAB — CBC WITH DIFFERENTIAL/PLATELET
Abs Immature Granulocytes: 0.18 10*3/uL — ABNORMAL HIGH (ref 0.00–0.07)
Basophils Absolute: 0 10*3/uL (ref 0.0–0.1)
Basophils Relative: 0 %
Eosinophils Absolute: 0 10*3/uL (ref 0.0–0.5)
Eosinophils Relative: 0 %
HCT: 47.2 % (ref 39.0–52.0)
Hemoglobin: 14.8 g/dL (ref 13.0–17.0)
Immature Granulocytes: 2 %
Lymphocytes Relative: 3 %
Lymphs Abs: 0.4 10*3/uL — ABNORMAL LOW (ref 0.7–4.0)
MCH: 29.4 pg (ref 26.0–34.0)
MCHC: 31.4 g/dL (ref 30.0–36.0)
MCV: 93.8 fL (ref 80.0–100.0)
Monocytes Absolute: 0.6 10*3/uL (ref 0.1–1.0)
Monocytes Relative: 5 %
Neutro Abs: 11.1 10*3/uL — ABNORMAL HIGH (ref 1.7–7.7)
Neutrophils Relative %: 90 %
Platelets: 381 10*3/uL (ref 150–400)
RBC: 5.03 MIL/uL (ref 4.22–5.81)
RDW: 14.8 % (ref 11.5–15.5)
WBC: 12.3 10*3/uL — ABNORMAL HIGH (ref 4.0–10.5)
nRBC: 0 % (ref 0.0–0.2)

## 2020-06-10 LAB — C-REACTIVE PROTEIN: CRP: 5.2 mg/dL — ABNORMAL HIGH (ref <1.0)

## 2020-06-10 LAB — COMPREHENSIVE METABOLIC PANEL
ALT: 31 U/L (ref 0–44)
AST: 27 U/L (ref 15–41)
Albumin: 3 g/dL — ABNORMAL LOW (ref 3.5–5.0)
Alkaline Phosphatase: 42 U/L (ref 38–126)
Anion gap: 9 (ref 5–15)
BUN: 16 mg/dL (ref 8–23)
CO2: 28 mmol/L (ref 22–32)
Calcium: 8.3 mg/dL — ABNORMAL LOW (ref 8.9–10.3)
Chloride: 97 mmol/L — ABNORMAL LOW (ref 98–111)
Creatinine, Ser: 0.7 mg/dL (ref 0.61–1.24)
GFR, Estimated: >60 mL/min (ref >60)
Glucose, Bld: 154 mg/dL — ABNORMAL HIGH (ref 70–99)
Potassium: 4 mmol/L (ref 3.5–5.1)
Sodium: 134 mmol/L — ABNORMAL LOW (ref 135–145)
Total Bilirubin: 0.6 mg/dL (ref 0.3–1.2)
Total Protein: 7.1 g/dL (ref 6.5–8.1)

## 2020-06-10 LAB — PROTIME-INR
INR: 2.4 — ABNORMAL HIGH (ref 0.8–1.2)
Prothrombin Time: 26 seconds — ABNORMAL HIGH (ref 11.4–15.2)

## 2020-06-10 LAB — GLUCOSE, CAPILLARY
Glucose-Capillary: 180 mg/dL — ABNORMAL HIGH (ref 70–99)
Glucose-Capillary: 130 mg/dL — ABNORMAL HIGH (ref 70–99)

## 2020-06-10 LAB — FERRITIN: Ferritin: 175 ng/mL (ref 24–336)

## 2020-06-10 LAB — D-DIMER, QUANTITATIVE: D-Dimer, Quant: 1.34 ug/mL-FEU — ABNORMAL HIGH (ref 0.00–0.50)

## 2020-06-10 MED ORDER — LABETALOL HCL 5 MG/ML IV SOLN
10.0000 mg | Freq: Once | INTRAVENOUS | Status: AC
  Administered 2020-06-10: 10 mg via INTRAVENOUS
  Filled 2020-06-10: qty 4

## 2020-06-10 MED ORDER — GUAIFENESIN-DM 100-10 MG/5ML PO SYRP: 10.0000 mL | ORAL_SOLUTION | ORAL | 1 refills | Status: AC | PRN

## 2020-06-10 MED ORDER — AMLODIPINE BESYLATE 5 MG PO TABS: 5.0000 mg | ORAL_TABLET | Freq: Every day | ORAL | 0 refills | Status: DC

## 2020-06-10 MED ORDER — AMLODIPINE BESYLATE 10 MG PO TABS
10.0000 mg | ORAL_TABLET | Freq: Every day | ORAL | Status: DC
  Administered 2020-06-10: 10 mg via ORAL
  Filled 2020-06-10: qty 1

## 2020-06-10 MED ORDER — PREDNISONE 10 MG PO TABS: 10.0000 mg | ORAL_TABLET | Freq: Every day | ORAL | 0 refills | Status: DC

## 2020-06-10 MED ORDER — METOPROLOL TARTRATE 25 MG PO TABS: 25.0000 mg | ORAL_TABLET | Freq: Two times a day (BID) | ORAL | 0 refills | Status: DC

## 2020-06-10 MED ORDER — ALBUTEROL SULFATE HFA 108 (90 BASE) MCG/ACT IN AERS: 2.0000 | INHALATION_SPRAY | Freq: Four times a day (QID) | RESPIRATORY_TRACT | 2 refills | Status: AC | PRN

## 2020-06-10 NOTE — Care Management Important Message (Signed)
Important Message  Patient Details IM Letter placed in door caddy. Name: Horacio Werth MRN: [id number] Date of Birth: Dec 14, 1935   Medicare Important Message Given:  Yes     Caren Macadam 06/10/2020, 12:36 PM

## 2020-06-10 NOTE — Progress Notes (Signed)
Patient discharged home with son, discharge instructions given and explained to patient/son, they verbalized understanding, patient denies any pain/distress, no pressure injury noted. Accompanied home by son

## 2020-06-10 NOTE — Discharge Summary (Signed)
Physician Discharge Summary  Mark Russell ZOX:[id number] DOB: [birth year] DOA: 06/06/2020  PCP: Maurice Small, MD  Admit date: 06/06/2020 Discharge date: 06/10/2020  Admitted From: Home Disposition:  Home  Discharge Condition:Stable CODE STATUS:FULL Diet recommendation: Heart Healthy  Brief/Interim Summary:  Patient is 85 year old male, unvaccinated against COVID, past medical history of OSA, COPD, type 2 diabetes, peripheral vascular disease status post AKA bilaterally, avascular necrosis, DVT on warfarin, hypertension, recently diagnosed with COVID on 5/22 presented to ED with confusion, shortness of breath.Patient was recently in the ED on 5/28 due to rash on his left buttock which was concerning for dermatitis and stage I left buttock pressure ulcer.  Patient was offered a prescription for Paxlovid vs Bebtelovimab in ED but family declined due to lack of symptoms.  He began having symptoms with lethargy and weakness on 5/28 and hasnt been out of  bed since and was prescribed molnupiravir on 6/1.  On presentation, he was hypoxic on room air and was placed on 3 L of oxygen per minute.  He was tachypneic, hypertensive, lactate level of 2.5, had WBC of 12, COVID screen test was positive, fibrinogen was more than 800.  Chest x-ray showed new mid mild and lower lung opacities worse than right.  CTA did not show any acute intracranial arteries.  Patient was admitted for the management of acute respiratory failure from COVID pneumonia.  Started on remdesivir, Solu-Medrol and baricitinib.  Respiratory status gradually improved and currently he is on room air.  He still has some cough but he feels much better today.  Patient is medically stable for discharge to home today.  Following problems were addressed during his hospitalization:  Acute hypoxic respite failure secondary to COVID pneumonia: Unvaccinated patient. Required  2 L of oxygen per minute on presentation.  On room air today.  Denies any  worsening shortness of breath.  Complains of some cough but is wheezing today. does not need oxygen at home.  Elevated inflammatory markers including CRP, now improving.  Started on steroids, remdesivir day 5/5, baricitinib.   COPD exacerbation: Most likely secondary to COVID.  Continue tapering prednisone, inhalers at home.  Currently he is on room air.  Acute metabolic encephalopathy: Secondary to COVID infection and hypoxia.  Continue monitor mental status.  Currently alert and oriented.  Lactic acidosis: Most likely secondary to dehydration.  Resolved  Supratherapeutic INR/history of DVT: On warfarin at home. INR was 8.1 on presentation,and was given vitamin K.  INR is therapeutic now.  Warfarin resumed  Paroxysmal A. fib: EKG during this hospitalization here showed A. fib.  His rate is well controlled.  On reviewing his chart, he was in normal sinus rhythm as per previous EKGs.  He is already on warfarin.  We will start on metoprolol that will help with the blood pressure too.  He needs to follow-up with his PCP.  Type II diabetes mellitus: Takes glimepiride at home.    Hypertensive urgency: Blood pressure was consistently on the higher range during this hospitalization.  He was not on any blood pressure medication at home.  He has been started on amlodipine and metoprolol.  Hyperlipidemia/history of peripheral vascular disease: Status post bilateral AKA.  Continue statin  Chronic pain syndrome: Continue supportive care, minimize sedatives/narcotics  Goals of care: 85 year old male with multiple comorbidities including pneumonia from COVID.  Remains full code.  Debility/deconditioning: Patient lives alone, uses wheelchair for ambulation.  PT/OT consulted   Discharge Diagnoses:  Principal Problem:   Acute respiratory failure (HCC) Active  Problems:   S/P AKA (above knee amputation) bilateral (HCC)   DVT (deep venous thrombosis) (HCC)   Diabetes type 2 with atherosclerosis of  arteries of extremities (HCC)   OSA (obstructive sleep apnea)   COVID-19   Acute metabolic encephalopathy    Discharge Instructions  Discharge Instructions    Diet - low sodium heart healthy   Complete by: As directed    Discharge instructions   Complete by: As directed    1)Please take prescribed medications as instructed 2)Follow up with your PCP in a week. 3)Infection Prevention Recommendations for Individuals Confirmed to have, or Being Evaluated for, 2019 Novel Coronavirus (COVID-19) Infection Who Receive Care at Home  Individuals who are confirmed to have, or are being evaluated for, COVID-19 should follow the prevention steps below until a healthcare provider or local or state health department says they can return to normal activities.  Stay home except to get medical care You should restrict activities outside your home, except for getting medical care. Do not go to work, school, or public areas, and do not use public transportation or taxis.  Call ahead before visiting your doctor Before your medical appointment, call the healthcare provider and tell them that you have, or are being evaluated for, COVID-19 infection. This will help the healthcare provider's office take steps to keep other people from getting infected. Ask your healthcare provider to call the local or state health department.  Monitor your symptoms Seek prompt medical attention if your illness is worsening (e.g., difficulty breathing). Before going to your medical appointment, call the healthcare provider and tell them that you have, or are being evaluated for, COVID-19 infection. Ask your healthcare provider to call the local or state health department.  Wear a facemask You should wear a facemask that covers your nose and mouth when you are in the same room with other people and when you visit a healthcare provider. People who live with or visit you should also wear a facemask while they are in  the same room with you.  Separate yourself from other people in your home As much as possible, you should stay in a different room from other people in your home. Also, you should use a separate bathroom, if available.  Avoid sharing household items You should not share dishes, drinking glasses, cups, eating utensils, towels, bedding, or other items with other people in your home. After using these items, you should wash them thoroughly with soap and water.  Cover your coughs and sneezes Cover your mouth and nose with a tissue when you cough or sneeze, or you can cough or sneeze into your sleeve. Throw used tissues in a lined trash can, and immediately wash your hands with soap and water for at least 20 seconds or use an alcohol-based hand rub.  Wash your [employer]hands Wash your hands often and thoroughly with soap and water for at least 20 seconds. You can use an alcohol-based hand sanitizer if soap and water are not available and if your hands are not visibly dirty. Avoid touching your eyes, nose, and mouth with unwashed hands.   Prevention Steps for Caregivers and Household Members of Individuals Confirmed to have, or Being Evaluated for, COVID-19 Infection Being Cared for in the Home  If you live with, or provide care at home for, a person confirmed to have, or being evaluated for, COVID-19 infection please follow these guidelines to prevent infection:  Follow healthcare provider's instructions Make sure that you understand and can help  the patient follow any healthcare provider instructions for all care.  Provide for the patient's basic needs You should help the patient with basic needs in the home and provide support for getting groceries, prescriptions, and other personal needs.  Monitor the patient's symptoms If they are getting sicker, call his or her medical provider and tell them that the patient has, or is being evaluated for, COVID-19 infection. This will help the  healthcare provider's office take steps to keep other people from getting infected. Ask the healthcare provider to call the local or state health department.  Limit the number of people who have contact with the patient If possible, have only one caregiver for the patient. Other household members should stay in another home or place of residence. If this is not possible, they should stay in another room, or be separated from the patient as much as possible. Use a separate bathroom, if available. Restrict visitors who do not have an essential need to be in the home.  Keep older adults, very young children, and other sick people away from the patient Keep older adults, very young children, and those who have compromised immune systems or chronic health conditions away from the patient. This includes people with chronic heart, lung, or kidney conditions, diabetes, and cancer.  Ensure good ventilation Make sure that shared spaces in the home have good air flow, such as from an air conditioner or an opened window, weather permitting.  Wash your hands often Wash your hands often and thoroughly with soap and water for at least 20 seconds. You can use an alcohol based hand sanitizer if soap and water are not available and if your hands are not visibly dirty. Avoid touching your eyes, nose, and mouth with unwashed hands. Use disposable paper towels to dry your hands. If not available, use dedicated cloth towels and replace them when they become wet.  Wear a facemask and gloves Wear a disposable facemask at all times in the room and gloves when you touch or have contact with the patient's blood, body fluids, and/or secretions or excretions, such as sweat, saliva, sputum, nasal mucus, vomit, urine, or feces. Ensure the mask fits over your nose and mouth tightly, and do not touch it during use. Throw out disposable facemasks and gloves after using them. Do not reuse. Wash your hands immediately  after removing your facemask and gloves. If your personal clothing becomes contaminated, carefully remove clothing and launder. Wash your hands after handling contaminated clothing. Place all used disposable facemasks, gloves, and other waste in a lined container before disposing them with other household waste. Remove gloves and wash your hands immediately after handling these items.  Do not share dishes, glasses, or other household items with the patient Avoid sharing household items. You should not share dishes, drinking glasses, cups, eating utensils, towels, bedding, or other items with a patient who is confirmed to have, or being evaluated for, COVID-19 infection. After the person uses these items, you should wash them thoroughly with soap and water.  Wash laundry thoroughly Immediately remove and wash clothes or bedding that have blood, body fluids, and/or secretions or excretions, such as sweat, saliva, sputum, nasal mucus, vomit, urine, or feces, on them. Wear gloves when handling laundry from the patient. Read and follow directions on labels of laundry or clothing items and detergent. In general, wash and dry with the warmest temperatures recommended on the label.  Clean all areas the individual has used often Clean all touchable surfaces, such  as counters, tabletops, doorknobs, bathroom fixtures, toilets, phones, keyboards, tablets, and bedside tables, every day. Also, clean any surfaces that may have blood, body fluids, and/or secretions or excretions on them. Wear gloves when cleaning surfaces the patient has come in contact with. Use a diluted bleach solution (e.g., dilute bleach with 1 part bleach and 10 parts water) or a household disinfectant with a label that says EPA-registered for coronaviruses. To make a bleach solution at home, add 1 tablespoon of bleach to 1 quart (4 cups) of water. For a larger supply, add  cup of bleach to 1 gallon (16 cups) of water. Read labels of  cleaning products and follow recommendations provided on product labels. Labels contain instructions for safe and effective use of the cleaning product including precautions you should take when applying the product, such as wearing gloves or eye protection and making sure you have good ventilation during use of the product. Remove gloves and wash hands immediately after cleaning.  Monitor yourself for signs and symptoms of illness Caregivers and household members are considered close contacts, should monitor their health, and will be asked to limit movement outside of the home to the extent possible. Follow the monitoring steps for close contacts listed on the symptom monitoring form.   Increase activity slowly   Complete by: As directed      Allergies as of 06/10/2020      Reactions   Ace Inhibitors    hypotension   Dilaudid [hydromorphone Hcl]    "Mean"   Metformin And Related    GI upset   Morphine And Related Other (See Comments)   "mean"   Pletal [cilostazol]    SOB      Medication List    STOP taking these medications   molnupiravir EUA 200 mg Caps     TAKE these medications   acetaminophen 325 MG tablet Commonly known as: TYLENOL Take 650 mg by mouth every 6 (six) hours as needed for mild pain, fever or headache.   albuterol 108 (90 Base) MCG/ACT inhaler Commonly known as: VENTOLIN HFA Inhale 2 puffs into the lungs every 6 (six) hours as needed for wheezing or shortness of breath.   amLODipine 5 MG tablet Commonly known as: NORVASC Take 1 tablet (5 mg total) by mouth daily.   donepezil 10 MG tablet Commonly known as: ARICEPT Take 10 mg by mouth at bedtime.   DULoxetine 30 MG capsule Commonly known as: CYMBALTA Take 30 mg by mouth daily.   fentaNYL 25 MCG/HR Commonly known as: DURAGESIC Place 1 patch onto the skin every other day.   FLUoxetine 20 MG capsule Commonly known as: PROZAC Take 40 mg by mouth daily.   gabapentin 300 MG capsule Commonly  known as: NEURONTIN Take 900 mg by mouth 3 (three) times daily.   glimepiride 4 MG tablet Commonly known as: AMARYL Take 4 mg by mouth daily.   guaiFENesin-dextromethorphan 100-10 MG/5ML syrup Commonly known as: ROBITUSSIN DM Take 10 mLs by mouth every 4 (four) hours as needed for cough.   Menthol (Topical Analgesic) 2 % Gel Apply 1 application topically 3 (three) times daily as needed (for extremity pain).   metoprolol tartrate 25 MG tablet Commonly known as: LOPRESSOR Take 1 tablet (25 mg total) by mouth 2 (two) times daily.   mineral oil-hydrophilic petrolatum ointment Apply 1 application topically as needed (bed sores).   Percocet 7.5-325 MG tablet Generic drug: oxyCODONE-acetaminophen Take 1 tablet by mouth every 6 (six) hours as needed for pain.  potassium chloride 10 MEQ tablet Commonly known as: KLOR-CON Take 1 tablet (10 mEq total) by mouth daily for 14 days. What changed: when to take this   predniSONE 10 MG tablet Commonly known as: DELTASONE Take 1 tablet (10 mg total) by mouth daily. Take 4 pills daily for 2 days then 3 pills daily 2 days then 2 pills daily for 2 days then 1 pill daily for 2 days then stop   simvastatin 40 MG tablet Commonly known as: ZOCOR Take 40 mg by mouth every evening.   Symbicort 160-4.5 MCG/ACT inhaler Generic drug: budesonide-formoterol Inhale 3 puffs into the lungs every 6 (six) hours as needed for wheezing or shortness of breath.   tamsulosin 0.4 MG Caps capsule Commonly known as: FLOMAX Take 0.4 mg by mouth daily.   VITAMIN C PO Take 1 tablet by mouth daily.   Vitamin D 50 MCG (2000 UT) tablet Take 2,000 Units by mouth daily.   warfarin 4 MG tablet Commonly known as: COUMADIN Take 2-4 mg by mouth See admin instructions. 2mg  on Monday Wendesday Friday and 4mg  on all other days   zinc gluconate 50 MG tablet Take 50 mg by mouth daily.       Follow-up Information    Saturday, MD. Schedule an appointment as  soon as possible for a visit in 1 week(s).   Specialty: Family Medicine Contact information: [address] [place] [phone]              Allergies  Allergen Reactions  . Ace Inhibitors     hypotension  . Dilaudid [Hydromorphone Hcl]     "Mean"  . Metformin And Related     GI upset  . Morphine And Related Other (See Comments)    "mean"  . Pletal [Cilostazol]     SOB    Consultations:  None   Procedures/Studies: DG Chest 1 View  Result Date: 06/01/2020 CLINICAL DATA:  Cough EXAM: CHEST  1 VIEW COMPARISON:  06/15/2019 FINDINGS: Mild scarring in the left upper lobe. Calcified granuloma in the left mid lung, benign. Right lung is clear, with surgical clips overlying the right upper hemithorax. No pleural effusion or pneumothorax. The heart is normal in size.  Thoracic aortic atherosclerosis. IMPRESSION: No evidence of acute cardiopulmonary disease. Electronically Signed   By: 06/03/2020 M.D.   On: 06/01/2020 11:37   DG Pelvis 1-2 Views  Result Date: 06/01/2020 CLINICAL DATA:  Developing pressure ulcer. EXAM: PELVIS - 1-2 VIEW COMPARISON:  06/18/2006 FINDINGS: Severe left hip osteoarthritis with joint distortion, joint collapse, and sclerosis. There may have been a prior femoral neck fracture or AVN. Bilateral postoperative inguinal or hip regions. No gross destructive process or sacroiliac erosion. There is generalized osteopenia. IMPRESSION: 1. Very limited modality for detecting osteomyelitis. 2. Advanced left hip osteoarthritis. Electronically Signed   By: 06/03/2020 M.D.   On: 06/01/2020 11:38   CT Head Wo Contrast  Result Date: 06/06/2020 CLINICAL DATA:  Delirium. EXAM: CT HEAD WITHOUT CONTRAST TECHNIQUE: Contiguous axial images were obtained from the base of the skull through the vertex without intravenous contrast. COMPARISON:  None. FINDINGS: Motion limited study.  Within this limitation: Brain: No evidence of acute large  vascular territory infarction, hemorrhage, hydrocephalus, extra-axial collection or mass effect. Multiple areas of ossification along the left convexity, measuring up to 5 mm in thickness convexity (series 2, image 53). Moderate generalized cerebral volume loss. Mild patchy white matter hypoattenuation, most likely related to  chronic microvascular ischemic disease. Vascular: No hyperdense vessel identified. Calcific intracranial atherosclerosis. Skull: No acute fracture. Sinuses/Orbits: Marked ethmoid air cell mucosal thickening with frothy secretions. Moderate left and mild right frontal sinus mucosal thickening with left frontoethmoidal frothy secretion/opacification. Near complete opacification of the right maxillary sinus and left maxillary sinus mucosal thickening. Bilateral sphenoid air-fluid levels. Other: No mastoid effusions. IMPRESSION: 1. No evidence of acute intracranial abnormality on this motion limited study. 2. Moderate cerebral atrophy and mild chronic microvascular ischemic disease. 3. Significant paranasal sinus disease, detailed above and suggestive of sinusitis. 4. Multiple areas of ossification along the left convexity (measuring up to 5 mm in thickness) that are favored to reflect dural ossification over ossified meningiomas. No significant mass effect. Electronically Signed   By: Feliberto Harts MD   On: 06/06/2020 10:48   DG Chest Port 1 View  Result Date: 06/06/2020 CLINICAL DATA:  Shortness of breath, COVID positive 06/01/2020 EXAM: PORTABLE CHEST 1 VIEW COMPARISON:  06/01/2020 FINDINGS: Increased mild mid and lower lung mixed interstitial and airspace opacities, worse on the right compatible with COVID related pneumonia. No large effusion or pneumothorax. Stable cardiomegaly without CHF. Aorta atherosclerotic. Degenerative changes of the spine and left shoulder. IMPRESSION: New mild mid and lower lung opacities, worse on the right compatible with COVID related pneumonia. Aortic  Atherosclerosis (ICD10-I70.0). Electronically Signed   By: Judie Petit.  Shick M.D.   On: 06/06/2020 08:17       Subjective: Patient seen and examined the bedside this morning.  Hemodynamically stable.  Comfortable.  On room air.  No wheezing today.  Cough is better.  He desperately wants to go home.  I called the daughter and updated daughter discharge plan.   Discharge Exam: Vitals:   06/10/20 0557 06/10/20 0804  BP: (!) 186/65 (!) 158/62  Pulse: 83   Resp: 20   Temp: 98.3 F (36.8 C)   SpO2: 100% 93%   Vitals:   06/09/20 1420 06/09/20 2136 06/10/20 0557 06/10/20 0804  BP: (!) 138/110 (!) 182/47 (!) 186/65 (!) 158/62  Pulse: 87 65 83   Resp:  13 20   Temp: 98.9 F (37.2 C) 97.7 F (36.5 C) 98.3 F (36.8 C)   TempSrc: Oral Oral Oral   SpO2: 93% 94% 100% 93%  Weight:      Height:        General: Pt is alert, awake, not in acute distress Cardiovascular: RRR, S1/S2 +, no rubs, no gallops Respiratory: CTA bilaterally, minimal  wheezing, no rhonchi Abdominal: Soft, NT, ND, bowel sounds + Extremities: B/L AKA   The results of significant diagnostics from this hospitalization (including imaging, microbiology, ancillary and laboratory) are listed below for reference.     Microbiology: Recent Results (from the past 240 hour(s))  Resp Panel by RT-PCR (Flu A&B, Covid) Nasopharyngeal Swab     Status: Abnormal   Collection Time: 06/01/20 10:58 AM   Specimen: Nasopharyngeal Swab; Nasopharyngeal(NP) swabs in vial transport medium  Result Value Ref Range Status   SARS Coronavirus 2 by RT PCR POSITIVE (A) NEGATIVE Final    Comment: RESULT CALLED TO, READ BACK BY AND VERIFIED WITH: SAVOIE,B. RN AT 1308 06/01/20 MULLINS,T (NOTE) SARS-CoV-2 target nucleic acids are DETECTED.  The SARS-CoV-2 RNA is generally detectable in upper respiratory specimens during the acute phase of infection. Positive results are indicative of the presence of the identified virus, but do not rule out bacterial  infection or co-infection with other pathogens not detected by the test. Clinical correlation with  patient history and other diagnostic information is necessary to determine patient infection status. The expected result is Negative.  Fact Sheet for Patients: BloggerCourse.com  Fact Sheet for Healthcare Providers: SeriousBroker.it  This test is not yet approved or cleared by the [place] FDA and  has been authorized for detection and/or diagnosis of SARS-CoV-2 by FDA under an Emergency Use Authorization (EUA).  This EUA will remain in effect (meaning this test  can be used) for the duration of  the COVID-19 declaration under Section 564(b)(1) of the Act, 21 U.S.C. section 360bbb-3(b)(1), unless the authorization is terminated or revoked sooner.     Influenza A by PCR NEGATIVE NEGATIVE Final   Influenza B by PCR NEGATIVE NEGATIVE Final    Comment: (NOTE) The Xpert Xpress SARS-CoV-2/FLU/RSV plus assay is intended as an aid in the diagnosis of influenza from Nasopharyngeal swab specimens and should not be used as a sole basis for treatment. Nasal washings and aspirates are unacceptable for Xpert Xpress SARS-CoV-2/FLU/RSV testing.  Fact Sheet for Patients: BloggerCourse.com  Fact Sheet for Healthcare Providers: SeriousBroker.it  This test is not yet approved or cleared by the [place] FDA and has been authorized for detection and/or diagnosis of SARS-CoV-2 by FDA under an Emergency Use Authorization (EUA). This EUA will remain in effect (meaning this test can be used) for the duration of the COVID-19 declaration under Section 564(b)(1) of the Act, 21 U.S.C. section 360bbb-3(b)(1), unless the authorization is terminated or revoked.  Performed at [facility], [address] [place], [place] [address]   Blood Culture (routine x 2)     Status: None  (Preliminary result)   Collection Time: 06/06/20  7:36 AM   Specimen: BLOOD  Result Value Ref Range Status   Specimen Description   Final    BLOOD LEFT ANTECUBITAL Performed at [facility], [address], [place], [place] [address]    Special Requests   Final    BOTTLES DRAWN AEROBIC AND ANAEROBIC Blood Culture results may not be optimal due to an inadequate volume of blood received in culture bottles Performed at [facility], [address], [place], [place] [address]    Culture   Final    NO GROWTH 4 DAYS Performed at [facility], [address] [place], [place] [address]    Report Status PENDING  Incomplete  Blood Culture (routine x 2)     Status: None (Preliminary result)   Collection Time: 06/06/20  7:36 AM   Specimen: BLOOD  Result Value Ref Range Status   Specimen Description   Final    BLOOD RIGHT ANTECUBITAL Performed at [facility], [address], [place], [place] [address]    Special Requests   Final    BOTTLES DRAWN AEROBIC AND ANAEROBIC Blood Culture results may not be optimal due to an inadequate volume of blood received in culture bottles Performed at [facility], [address] [place], [place] [address]    Culture   Final    NO GROWTH 4 DAYS Performed at [facility], [address], [place], [place] [address]    Report Status PENDING  Incomplete  Resp Panel by RT-PCR (Flu A&B, Covid) Nasopharyngeal Swab     Status: Abnormal   Collection Time: 06/06/20  7:45 AM   Specimen: Nasopharyngeal Swab; Nasopharyngeal(NP) swabs in vial transport medium  Result Value Ref Range Status   SARS Coronavirus 2 by RT PCR POSITIVE (A) NEGATIVE  Final    Comment: CRITICAL RESULT CALLED TO, READ BACK BY AND VERIFIED WITH: SOVOIE B. ON  06/06/2020 @ 0917 BY MECIAL J (NOTE) SARS-CoV-2 target nucleic acids are DETECTED.  The SARS-CoV-2 RNA is generally detectable in upper  respiratory specimens during the acute phase of infection. Positive results are indicative of the presence of the identified virus, but do not rule out bacterial infection or co-infection with other pathogens not detected by the test. Clinical correlation with patient history and other diagnostic information is necessary to determine patient infection status. The expected result is Negative.  Fact Sheet for Patients: BloggerCourse.com  Fact Sheet for Healthcare Providers: SeriousBroker.it  This test is not yet approved or cleared by the [place] FDA and  has been authorized for detection and/or diagnosis of SARS-CoV-2 by FDA under an Emergency Use Authorization (EUA).  This EUA will remain in effect (meani ng this test can be used) for the duration of  the COVID-19 declaration under Section 564(b)(1) of the Act, 21 U.S.C. section 360bbb-3(b)(1), unless the authorization is terminated or revoked sooner.     Influenza A by PCR NEGATIVE NEGATIVE Final   Influenza B by PCR NEGATIVE NEGATIVE Final    Comment: (NOTE) The Xpert Xpress SARS-CoV-2/FLU/RSV plus assay is intended as an aid in the diagnosis of influenza from Nasopharyngeal swab specimens and should not be used as a sole basis for treatment. Nasal washings and aspirates are unacceptable for Xpert Xpress SARS-CoV-2/FLU/RSV testing.  Fact Sheet for Patients: BloggerCourse.com  Fact Sheet for Healthcare Providers: SeriousBroker.it  This test is not yet approved or cleared by the [place] FDA and has been authorized for detection and/or diagnosis of SARS-CoV-2 by FDA under an Emergency Use Authorization (EUA). This EUA will remain in effect (meaning this test can be used) for the duration of the COVID-19 declaration under Section 564(b)(1) of the Act, 21 U.S.C. section 360bbb-3(b)(1), unless the authorization is  terminated or revoked.  Performed at [facility], [address] [place], [place] [address]      Labs: BNP (last 3 results) No results for input(s): BNP in the last 8760 hours. Basic Metabolic Panel: Recent Labs  Lab 06/06/20 0736 06/07/20 0323 06/08/20 0316 06/09/20 0802 06/10/20 0356  NA 133* 136 135 136 134*  K 4.3 4.3 3.5 3.8 4.0  CL 95* 99 95* 94* 97*  CO2 27 27 30  33* 28  GLUCOSE 155* 181* 106* 84 154*  BUN 9 17 16 15 16   CREATININE 0.55* 0.69 0.55* 0.67 0.70  CALCIUM 8.3* 8.5* 8.3* 8.5* 8.3*   Liver Function Tests: Recent Labs  Lab 06/06/20 0736 06/07/20 0323 06/08/20 0316 06/09/20 0802 06/10/20 0356  AST 36 31 34 38 27  ALT 17 18 23 28 31   ALKPHOS 51 44 37* 40 42  BILITOT 1.0 0.8 0.7 0.7 0.6  PROT 8.1 7.3 7.0 7.4 7.1  ALBUMIN 3.2* 3.0* 2.9* 3.0* 3.0*   No results for input(s): LIPASE, AMYLASE in the last 168 hours. No results for input(s): AMMONIA in the last 168 hours. CBC: Recent Labs  Lab 06/06/20 0736 06/07/20 0323 06/08/20 0316 06/09/20 0802 06/10/20 0356  WBC 12.0* 8.8 12.5* 12.6* 12.3*  NEUTROABS 10.0* 8.0* 10.7* 10.2* 11.1*  HGB 15.6 14.3 14.6 15.0 14.8  HCT 49.8 46.5 46.7 47.8 47.2  MCV 93.3 95.1 94.2 94.5 93.8  PLT 285 295 340 375 381   Cardiac Enzymes: No results for input(s): CKTOTAL, CKMB, CKMBINDEX, TROPONINI in the last 168 hours. BNP:  Invalid input(s): POCBNP CBG: Recent Labs  Lab 06/09/20 0913 06/09/20 1212 06/09/20 1652 06/09/20 2126 06/10/20 0751  GLUCAP 94 124* 191* 206* 180*   D-Dimer Recent Labs    06/09/20 0802 06/10/20 0356  DDIMER 1.43* 1.34*   Hgb A1c No results for input(s): HGBA1C in the last 72 hours. Lipid Profile No results for input(s): CHOL, HDL, LDLCALC, TRIG, CHOLHDL, LDLDIRECT in the last 72 hours. Thyroid function studies No results for input(s): TSH, T4TOTAL, T3FREE, THYROIDAB in the last 72 hours.  Invalid input(s): FREET3 Anemia work up Recent Labs     06/09/20 0802 06/10/20 0356  FERRITIN 153 175   Urinalysis    Component Value Date/Time   COLORURINE AMBER (A) 06/06/2020 1253   APPEARANCEUR CLEAR 06/06/2020 1253   LABSPEC 1.040 (H) 06/06/2020 1253   PHURINE 5.0 06/06/2020 1253   GLUCOSEU NEGATIVE 06/06/2020 1253   HGBUR SMALL (A) 06/06/2020 1253   BILIRUBINUR NEGATIVE 06/06/2020 1253   KETONESUR 5 (A) 06/06/2020 1253   PROTEINUR >=300 (A) 06/06/2020 1253   UROBILINOGEN 1.0 08/06/2011 1157   NITRITE NEGATIVE 06/06/2020 1253   LEUKOCYTESUR NEGATIVE 06/06/2020 1253   Sepsis Labs Invalid input(s): PROCALCITONIN,  WBC,  LACTICIDVEN Microbiology Recent Results (from the past 240 hour(s))  Resp Panel by RT-PCR (Flu A&B, Covid) Nasopharyngeal Swab     Status: Abnormal   Collection Time: 06/01/20 10:58 AM   Specimen: Nasopharyngeal Swab; Nasopharyngeal(NP) swabs in vial transport medium  Result Value Ref Range Status   SARS Coronavirus 2 by RT PCR POSITIVE (A) NEGATIVE Final    Comment: RESULT CALLED TO, READ BACK BY AND VERIFIED WITH: SAVOIE,B. RN AT 1308 06/01/20 MULLINS,T (NOTE) SARS-CoV-2 target nucleic acids are DETECTED.  The SARS-CoV-2 RNA is generally detectable in upper respiratory specimens during the acute phase of infection. Positive results are indicative of the presence of the identified virus, but do not rule out bacterial infection or co-infection with other pathogens not detected by the test. Clinical correlation with patient history and other diagnostic information is necessary to determine patient infection status. The expected result is Negative.  Fact Sheet for Patients: BloggerCourse.com  Fact Sheet for Healthcare Providers: SeriousBroker.it  This test is not yet approved or cleared by the [place] FDA and  has been authorized for detection and/or diagnosis of SARS-CoV-2 by FDA under an Emergency Use Authorization (EUA).  This EUA will remain in  effect (meaning this test  can be used) for the duration of  the COVID-19 declaration under Section 564(b)(1) of the Act, 21 U.S.C. section 360bbb-3(b)(1), unless the authorization is terminated or revoked sooner.     Influenza A by PCR NEGATIVE NEGATIVE Final   Influenza B by PCR NEGATIVE NEGATIVE Final    Comment: (NOTE) The Xpert Xpress SARS-CoV-2/FLU/RSV plus assay is intended as an aid in the diagnosis of influenza from Nasopharyngeal swab specimens and should not be used as a sole basis for treatment. Nasal washings and aspirates are unacceptable for Xpert Xpress SARS-CoV-2/FLU/RSV testing.  Fact Sheet for Patients: BloggerCourse.com  Fact Sheet for Healthcare Providers: SeriousBroker.it  This test is not yet approved or cleared by the [place] FDA and has been authorized for detection and/or diagnosis of SARS-CoV-2 by FDA under an Emergency Use Authorization (EUA). This EUA will remain in effect (meaning this test can be used) for the duration of the COVID-19 declaration under Section 564(b)(1) of the Act, 21 U.S.C. section 360bbb-3(b)(1), unless the authorization is terminated or revoked.  Performed at [facility],  [address], [place], [place] [address]   Blood Culture (routine x 2)     Status: None (Preliminary result)   Collection Time: 06/06/20  7:36 AM   Specimen: BLOOD  Result Value Ref Range Status   Specimen Description   Final    BLOOD LEFT ANTECUBITAL Performed at [facility], [address], [place], [place] [address]    Special Requests   Final    BOTTLES DRAWN AEROBIC AND ANAEROBIC Blood Culture results may not be optimal due to an inadequate volume of blood received in culture bottles Performed at [facility], [address], [place], [place] [address]    Culture   Final    NO GROWTH 4 DAYS Performed at [facility], [address] [place], [place] [address]    Report Status PENDING  Incomplete  Blood Culture (routine x 2)     Status: None (Preliminary result)   Collection Time: 06/06/20  7:36 AM   Specimen: BLOOD  Result Value Ref Range Status   Specimen Description   Final    BLOOD RIGHT ANTECUBITAL Performed at [facility], [address], [place], [place] [address]    Special Requests   Final    BOTTLES DRAWN AEROBIC AND ANAEROBIC Blood Culture results may not be optimal due to an inadequate volume of blood received in culture bottles Performed at [facility], [address] [place], [place] [address]    Culture   Final    NO GROWTH 4 DAYS Performed at [facility], [address], [place], [place] [address]    Report Status PENDING  Incomplete  Resp Panel by RT-PCR (Flu A&B, Covid) Nasopharyngeal Swab     Status: Abnormal   Collection Time: 06/06/20  7:45 AM   Specimen: Nasopharyngeal Swab; Nasopharyngeal(NP) swabs in vial transport medium  Result Value Ref Range Status   SARS Coronavirus 2 by RT PCR POSITIVE (A) NEGATIVE Final    Comment: CRITICAL RESULT CALLED TO, READ BACK BY AND VERIFIED WITH: SOVOIE B. ON  06/06/2020 @ 0917 BY MECIAL J (NOTE) SARS-CoV-2 target nucleic acids are DETECTED.  The SARS-CoV-2 RNA is generally detectable in upper respiratory specimens during the acute phase of infection. Positive results are indicative of the presence of the identified virus, but do not rule out bacterial infection or co-infection with other pathogens not detected by the test. Clinical correlation with patient history and other diagnostic information is necessary to determine patient infection status. The expected result is Negative.  Fact Sheet for Patients: BloggerCourse.com  Fact Sheet for Healthcare Providers: SeriousBroker.it  This test is not yet approved or cleared by the [place] FDA  and  has been authorized for detection and/or diagnosis of SARS-CoV-2 by FDA under an Emergency Use Authorization (EUA).  This EUA will remain in effect (meani ng this test can be used) for the duration of  the COVID-19 declaration under Section 564(b)(1) of the Act, 21 U.S.C. section 360bbb-3(b)(1), unless the authorization is terminated or revoked sooner.     Influenza A by PCR NEGATIVE NEGATIVE Final   Influenza B by PCR NEGATIVE NEGATIVE Final    Comment: (NOTE) The Xpert Xpress SARS-CoV-2/FLU/RSV plus assay is intended as an aid in the diagnosis of influenza from Nasopharyngeal swab specimens and should not be used as a sole basis for treatment. Nasal washings and aspirates are unacceptable for Xpert Xpress SARS-CoV-2/FLU/RSV testing.  Fact Sheet for Patients: BloggerCourse.com  Fact Sheet for Healthcare Providers: SeriousBroker.it  This test is not yet approved or cleared by the [place] FDA and has been authorized for detection and/or diagnosis of SARS-CoV-2 by FDA under an Emergency Use Authorization (EUA). This EUA will remain in effect (meaning this test can be used) for the duration of the COVID-19 declaration under Section 564(b)(1) of the Act, 21 U.S.C. section 360bbb-3(b)(1), unless the authorization is terminated or revoked.  Performed at [facility], [address] [place], [place] [address]     Please note: You were cared for by a hospitalist during your hospital stay. Once you are discharged, your primary care physician will handle any further medical issues. Please note that NO REFILLS for any discharge medications will be authorized once you are discharged, as it is imperative that you return to your primary care physician (or establish a relationship with a primary care physician if you do not have one) for your post hospital discharge needs so that they can reassess your need for  medications and monitor your lab values.    Time coordinating discharge: 40 minutes  SIGNED:   Burnadette Pop, MD  [facility] 06/10/2020, 11:11 AM Pager [phone]  If 7PM-7AM, please contact night-coverage [email or web address] Password [email or web address]

## 2020-06-10 NOTE — TOC Progression Note (Signed)
Transition of Care [facility]) - Progression Note    Patient Details  Name: Zerek Litsey MRN: [id number] Date of Birth: [birth year]  Transition of Care [facility]) CM/SW Contact  Geni Bers, RN Phone Number: 06/10/2020, 12:48 PM  Clinical Narrative:     Spoke with pt's daughter concerning discharge plan to home with. Daughter Olegario Messier agreed with [facility] for Home Health. Referral given to in house rep.   Expected Discharge Plan: Home/Self Care Barriers to Discharge: No Barriers Identified  Expected Discharge Plan and Services Expected Discharge Plan: Home/Self Care       Living arrangements for the past 2 months: Single Family Home Expected Discharge Date: 06/10/20                                     Social Determinants of Health (SDOH) Interventions    Readmission Risk Interventions No flowsheet data found.

## 2020-06-11 DIAGNOSIS — G9341 Metabolic encephalopathy: Secondary | ICD-10-CM | POA: Diagnosis not present

## 2020-06-11 DIAGNOSIS — Z89612 Acquired absence of left leg above knee: Secondary | ICD-10-CM | POA: Diagnosis not present

## 2020-06-11 DIAGNOSIS — J441 Chronic obstructive pulmonary disease with (acute) exacerbation: Secondary | ICD-10-CM | POA: Diagnosis not present

## 2020-06-11 DIAGNOSIS — J9600 Acute respiratory failure, unspecified whether with hypoxia or hypercapnia: Secondary | ICD-10-CM | POA: Diagnosis not present

## 2020-06-11 DIAGNOSIS — Z89611 Acquired absence of right leg above knee: Secondary | ICD-10-CM | POA: Diagnosis not present

## 2020-06-11 DIAGNOSIS — U071 COVID-19: Secondary | ICD-10-CM | POA: Diagnosis not present

## 2020-06-11 DIAGNOSIS — Z86718 Personal history of other venous thrombosis and embolism: Secondary | ICD-10-CM | POA: Diagnosis not present

## 2020-06-11 DIAGNOSIS — J1282 Pneumonia due to coronavirus disease 2019: Secondary | ICD-10-CM | POA: Diagnosis not present

## 2020-06-11 DIAGNOSIS — I480 Paroxysmal atrial fibrillation: Secondary | ICD-10-CM | POA: Diagnosis not present

## 2020-06-11 DIAGNOSIS — E1151 Type 2 diabetes mellitus with diabetic peripheral angiopathy without gangrene: Secondary | ICD-10-CM | POA: Diagnosis not present

## 2020-06-11 DIAGNOSIS — M6281 Muscle weakness (generalized): Secondary | ICD-10-CM | POA: Diagnosis not present

## 2020-06-11 DIAGNOSIS — I10 Essential (primary) hypertension: Secondary | ICD-10-CM | POA: Diagnosis not present

## 2020-06-11 LAB — CULTURE, BLOOD (ROUTINE X 2)
Culture: NO GROWTH
Culture: NO GROWTH

## 2020-06-13 DIAGNOSIS — M6281 Muscle weakness (generalized): Secondary | ICD-10-CM | POA: Diagnosis not present

## 2020-06-13 DIAGNOSIS — Z86718 Personal history of other venous thrombosis and embolism: Secondary | ICD-10-CM | POA: Diagnosis not present

## 2020-06-13 DIAGNOSIS — U071 COVID-19: Secondary | ICD-10-CM | POA: Diagnosis not present

## 2020-06-13 DIAGNOSIS — G9341 Metabolic encephalopathy: Secondary | ICD-10-CM | POA: Diagnosis not present

## 2020-06-13 DIAGNOSIS — J9600 Acute respiratory failure, unspecified whether with hypoxia or hypercapnia: Secondary | ICD-10-CM | POA: Diagnosis not present

## 2020-06-13 DIAGNOSIS — J441 Chronic obstructive pulmonary disease with (acute) exacerbation: Secondary | ICD-10-CM | POA: Diagnosis not present

## 2020-06-13 DIAGNOSIS — I480 Paroxysmal atrial fibrillation: Secondary | ICD-10-CM | POA: Diagnosis not present

## 2020-06-13 DIAGNOSIS — E1151 Type 2 diabetes mellitus with diabetic peripheral angiopathy without gangrene: Secondary | ICD-10-CM | POA: Diagnosis not present

## 2020-06-13 DIAGNOSIS — I10 Essential (primary) hypertension: Secondary | ICD-10-CM | POA: Diagnosis not present

## 2020-06-13 DIAGNOSIS — J1282 Pneumonia due to coronavirus disease 2019: Secondary | ICD-10-CM | POA: Diagnosis not present

## 2020-06-14 ENCOUNTER — Emergency Department (HOSPITAL_COMMUNITY): Payer: Medicare Other

## 2020-06-14 ENCOUNTER — Encounter (HOSPITAL_COMMUNITY): Payer: Self-pay | Admitting: Emergency Medicine

## 2020-06-14 ENCOUNTER — Other Ambulatory Visit: Payer: Self-pay

## 2020-06-14 ENCOUNTER — Inpatient Hospital Stay (HOSPITAL_COMMUNITY)
Admission: EM | Admit: 2020-06-14 | Discharge: 2020-06-20 | DRG: 602 | Disposition: A | Discharge status: Home Health | Payer: Medicare Other | Attending: Internal Medicine | Admitting: Internal Medicine

## 2020-06-14 DIAGNOSIS — J323 Chronic sphenoidal sinusitis: Secondary | ICD-10-CM | POA: Diagnosis not present

## 2020-06-14 DIAGNOSIS — Z89612 Acquired absence of left leg above knee: Secondary | ICD-10-CM | POA: Diagnosis not present

## 2020-06-14 DIAGNOSIS — I10 Essential (primary) hypertension: Secondary | ICD-10-CM | POA: Diagnosis not present

## 2020-06-14 DIAGNOSIS — Z8679 Personal history of other diseases of the circulatory system: Secondary | ICD-10-CM

## 2020-06-14 DIAGNOSIS — J320 Chronic maxillary sinusitis: Secondary | ICD-10-CM | POA: Diagnosis present

## 2020-06-14 DIAGNOSIS — Z888 Allergy status to other drugs, medicaments and biological substances status: Secondary | ICD-10-CM

## 2020-06-14 DIAGNOSIS — Z89611 Acquired absence of right leg above knee: Secondary | ICD-10-CM

## 2020-06-14 DIAGNOSIS — L989 Disorder of the skin and subcutaneous tissue, unspecified: Secondary | ICD-10-CM | POA: Diagnosis not present

## 2020-06-14 DIAGNOSIS — G8929 Other chronic pain: Secondary | ICD-10-CM | POA: Diagnosis not present

## 2020-06-14 DIAGNOSIS — Z743 Need for continuous supervision: Secondary | ICD-10-CM | POA: Diagnosis not present

## 2020-06-14 DIAGNOSIS — Z7901 Long term (current) use of anticoagulants: Secondary | ICD-10-CM

## 2020-06-14 DIAGNOSIS — Z800 Family history of malignant neoplasm of digestive organs: Secondary | ICD-10-CM

## 2020-06-14 DIAGNOSIS — Z87891 Personal history of nicotine dependence: Secondary | ICD-10-CM

## 2020-06-14 DIAGNOSIS — B9562 Methicillin resistant Staphylococcus aureus infection as the cause of diseases classified elsewhere: Secondary | ICD-10-CM | POA: Diagnosis present

## 2020-06-14 DIAGNOSIS — R911 Solitary pulmonary nodule: Secondary | ICD-10-CM

## 2020-06-14 DIAGNOSIS — R9431 Abnormal electrocardiogram [ECG] [EKG]: Secondary | ICD-10-CM | POA: Diagnosis not present

## 2020-06-14 DIAGNOSIS — Z8249 Family history of ischemic heart disease and other diseases of the circulatory system: Secondary | ICD-10-CM

## 2020-06-14 DIAGNOSIS — R197 Diarrhea, unspecified: Secondary | ICD-10-CM | POA: Diagnosis not present

## 2020-06-14 DIAGNOSIS — F419 Anxiety disorder, unspecified: Secondary | ICD-10-CM | POA: Diagnosis present

## 2020-06-14 DIAGNOSIS — R5381 Other malaise: Secondary | ICD-10-CM | POA: Diagnosis not present

## 2020-06-14 DIAGNOSIS — I480 Paroxysmal atrial fibrillation: Secondary | ICD-10-CM

## 2020-06-14 DIAGNOSIS — E1151 Type 2 diabetes mellitus with diabetic peripheral angiopathy without gangrene: Secondary | ICD-10-CM | POA: Diagnosis not present

## 2020-06-14 DIAGNOSIS — G9341 Metabolic encephalopathy: Secondary | ICD-10-CM | POA: Diagnosis present

## 2020-06-14 DIAGNOSIS — Z86718 Personal history of other venous thrombosis and embolism: Secondary | ICD-10-CM

## 2020-06-14 DIAGNOSIS — U071 COVID-19: Secondary | ICD-10-CM | POA: Diagnosis present

## 2020-06-14 DIAGNOSIS — R404 Transient alteration of awareness: Secondary | ICD-10-CM | POA: Diagnosis not present

## 2020-06-14 DIAGNOSIS — Z8616 Personal history of COVID-19: Secondary | ICD-10-CM

## 2020-06-14 DIAGNOSIS — N400 Enlarged prostate without lower urinary tract symptoms: Secondary | ICD-10-CM | POA: Diagnosis present

## 2020-06-14 DIAGNOSIS — Z885 Allergy status to narcotic agent status: Secondary | ICD-10-CM

## 2020-06-14 DIAGNOSIS — R159 Full incontinence of feces: Secondary | ICD-10-CM | POA: Diagnosis present

## 2020-06-14 DIAGNOSIS — L03211 Cellulitis of face: Secondary | ICD-10-CM | POA: Diagnosis not present

## 2020-06-14 DIAGNOSIS — K219 Gastro-esophageal reflux disease without esophagitis: Secondary | ICD-10-CM | POA: Diagnosis not present

## 2020-06-14 DIAGNOSIS — F32A Depression, unspecified: Secondary | ICD-10-CM | POA: Diagnosis not present

## 2020-06-14 DIAGNOSIS — I499 Cardiac arrhythmia, unspecified: Secondary | ICD-10-CM | POA: Diagnosis not present

## 2020-06-14 DIAGNOSIS — I70203 Unspecified atherosclerosis of native arteries of extremities, bilateral legs: Secondary | ICD-10-CM | POA: Diagnosis not present

## 2020-06-14 DIAGNOSIS — E785 Hyperlipidemia, unspecified: Secondary | ICD-10-CM

## 2020-06-14 DIAGNOSIS — Z79899 Other long term (current) drug therapy: Secondary | ICD-10-CM

## 2020-06-14 DIAGNOSIS — R531 Weakness: Secondary | ICD-10-CM | POA: Diagnosis not present

## 2020-06-14 DIAGNOSIS — R6889 Other general symptoms and signs: Secondary | ICD-10-CM | POA: Diagnosis not present

## 2020-06-14 DIAGNOSIS — J9811 Atelectasis: Secondary | ICD-10-CM | POA: Diagnosis not present

## 2020-06-14 DIAGNOSIS — R059 Cough, unspecified: Secondary | ICD-10-CM | POA: Diagnosis not present

## 2020-06-14 DIAGNOSIS — I70209 Unspecified atherosclerosis of native arteries of extremities, unspecified extremity: Secondary | ICD-10-CM | POA: Diagnosis present

## 2020-06-14 DIAGNOSIS — L03221 Cellulitis of neck: Secondary | ICD-10-CM | POA: Diagnosis not present

## 2020-06-14 DIAGNOSIS — L03811 Cellulitis of head [any part, except face]: Secondary | ICD-10-CM

## 2020-06-14 DIAGNOSIS — R791 Abnormal coagulation profile: Secondary | ICD-10-CM | POA: Diagnosis not present

## 2020-06-14 LAB — PROTIME-INR
INR: 8.9 (ref 0.8–1.2)
Prothrombin Time: 72.5 seconds — ABNORMAL HIGH (ref 11.4–15.2)

## 2020-06-14 LAB — CBC WITH DIFFERENTIAL/PLATELET
Abs Immature Granulocytes: 0.22 10*3/uL — ABNORMAL HIGH (ref 0.00–0.07)
Basophils Absolute: 0 10*3/uL (ref 0.0–0.1)
Basophils Relative: 0 %
Eosinophils Absolute: 0 10*3/uL (ref 0.0–0.5)
Eosinophils Relative: 0 %
HCT: 45 % (ref 39.0–52.0)
Hemoglobin: 14.1 g/dL (ref 13.0–17.0)
Immature Granulocytes: 1 %
Lymphocytes Relative: 3 %
Lymphs Abs: 0.6 10*3/uL — ABNORMAL LOW (ref 0.7–4.0)
MCH: 29.4 pg (ref 26.0–34.0)
MCHC: 31.3 g/dL (ref 30.0–36.0)
MCV: 93.8 fL (ref 80.0–100.0)
Monocytes Absolute: 1.4 10*3/uL — ABNORMAL HIGH (ref 0.1–1.0)
Monocytes Relative: 7 %
Neutro Abs: 18.3 10*3/uL — ABNORMAL HIGH (ref 1.7–7.7)
Neutrophils Relative %: 89 %
Platelets: 527 10*3/uL — ABNORMAL HIGH (ref 150–400)
RBC: 4.8 MIL/uL (ref 4.22–5.81)
RDW: 14.9 % (ref 11.5–15.5)
WBC: 20.6 10*3/uL — ABNORMAL HIGH (ref 4.0–10.5)
nRBC: 0 % (ref 0.0–0.2)

## 2020-06-14 LAB — COMPREHENSIVE METABOLIC PANEL
ALT: 21 U/L (ref 0–44)
AST: 19 U/L (ref 15–41)
Albumin: 2.6 g/dL — ABNORMAL LOW (ref 3.5–5.0)
Alkaline Phosphatase: 52 U/L (ref 38–126)
Anion gap: 9 (ref 5–15)
BUN: 12 mg/dL (ref 8–23)
CO2: 32 mmol/L (ref 22–32)
Calcium: 8.2 mg/dL — ABNORMAL LOW (ref 8.9–10.3)
Chloride: 95 mmol/L — ABNORMAL LOW (ref 98–111)
Creatinine, Ser: 0.74 mg/dL (ref 0.61–1.24)
GFR, Estimated: >60 mL/min (ref >60)
Glucose, Bld: 164 mg/dL — ABNORMAL HIGH (ref 70–99)
Potassium: 3.7 mmol/L (ref 3.5–5.1)
Sodium: 136 mmol/L (ref 135–145)
Total Bilirubin: 0.7 mg/dL (ref 0.3–1.2)
Total Protein: 6.7 g/dL (ref 6.5–8.1)

## 2020-06-14 LAB — URINALYSIS, ROUTINE W REFLEX MICROSCOPIC
Bacteria, UA: NONE SEEN
Bilirubin Urine: NEGATIVE
Glucose, UA: NEGATIVE mg/dL
Hgb urine dipstick: NEGATIVE
Ketones, ur: NEGATIVE mg/dL
Nitrite: NEGATIVE
Protein, ur: 100 mg/dL — AB
Specific Gravity, Urine: 1.023 (ref 1.005–1.030)
pH: 6 (ref 5.0–8.0)

## 2020-06-14 LAB — GLUCOSE, CAPILLARY: Glucose-Capillary: 103 mg/dL — ABNORMAL HIGH (ref 70–99)

## 2020-06-14 MED ORDER — MOMETASONE FURO-FORMOTEROL FUM 200-5 MCG/ACT IN AERO
2.0000 | INHALATION_SPRAY | Freq: Two times a day (BID) | RESPIRATORY_TRACT | Status: DC
  Administered 2020-06-15 – 2020-06-20 (×10): 2 via RESPIRATORY_TRACT
  Filled 2020-06-14: qty 8.8

## 2020-06-14 MED ORDER — DONEPEZIL HCL 10 MG PO TABS
10.0000 mg | ORAL_TABLET | Freq: Every day | ORAL | Status: DC
  Administered 2020-06-14 – 2020-06-19 (×5): 10 mg via ORAL
  Filled 2020-06-14 (×6): qty 1

## 2020-06-14 MED ORDER — SODIUM CHLORIDE 0.9 % IV SOLN
1.0000 g | Freq: Once | INTRAVENOUS | Status: AC
  Administered 2020-06-14: 1 g via INTRAVENOUS
  Filled 2020-06-14: qty 10

## 2020-06-14 MED ORDER — GUAIFENESIN ER 600 MG PO TB12
600.0000 mg | ORAL_TABLET | Freq: Two times a day (BID) | ORAL | Status: DC
  Administered 2020-06-14 – 2020-06-20 (×11): 600 mg via ORAL
  Filled 2020-06-14 (×13): qty 1

## 2020-06-14 MED ORDER — FENTANYL 25 MCG/HR TD PT72
1.0000 | MEDICATED_PATCH | TRANSDERMAL | Status: DC
  Administered 2020-06-15 – 2020-06-18 (×2): 1 via TRANSDERMAL
  Filled 2020-06-14 (×2): qty 1

## 2020-06-14 MED ORDER — ACETAMINOPHEN 325 MG PO TABS
650.0000 mg | ORAL_TABLET | Freq: Four times a day (QID) | ORAL | Status: DC | PRN
  Administered 2020-06-17 – 2020-06-18 (×3): 650 mg via ORAL
  Filled 2020-06-14 (×4): qty 2

## 2020-06-14 MED ORDER — GABAPENTIN 300 MG PO CAPS
900.0000 mg | ORAL_CAPSULE | Freq: Three times a day (TID) | ORAL | Status: DC
  Administered 2020-06-14 – 2020-06-20 (×16): 900 mg via ORAL
  Filled 2020-06-14 (×17): qty 3

## 2020-06-14 MED ORDER — SIMVASTATIN 40 MG PO TABS
40.0000 mg | ORAL_TABLET | Freq: Every evening | ORAL | Status: DC
  Administered 2020-06-14 – 2020-06-19 (×5): 40 mg via ORAL
  Filled 2020-06-14 (×5): qty 1

## 2020-06-14 MED ORDER — INSULIN ASPART 100 UNIT/ML IJ SOLN
0.0000 [IU] | Freq: Every day | INTRAMUSCULAR | Status: DC
  Administered 2020-06-19: 2 [IU] via SUBCUTANEOUS

## 2020-06-14 MED ORDER — ONDANSETRON HCL 4 MG PO TABS: 4.0000 mg | ORAL_TABLET | Freq: Four times a day (QID) | ORAL | Status: DC | PRN

## 2020-06-14 MED ORDER — VITAMIN K1 10 MG/ML IJ SOLN
1.0000 mg | Freq: Once | INTRAVENOUS | Status: AC
  Administered 2020-06-14: 1 mg via INTRAVENOUS
  Filled 2020-06-14: qty 0.1

## 2020-06-14 MED ORDER — AMLODIPINE BESYLATE 5 MG PO TABS
5.0000 mg | ORAL_TABLET | Freq: Every day | ORAL | Status: DC
  Administered 2020-06-14 – 2020-06-18 (×5): 5 mg via ORAL
  Filled 2020-06-14 (×5): qty 1

## 2020-06-14 MED ORDER — METOPROLOL TARTRATE 25 MG PO TABS
25.0000 mg | ORAL_TABLET | Freq: Two times a day (BID) | ORAL | Status: DC
  Administered 2020-06-14 – 2020-06-20 (×11): 25 mg via ORAL
  Filled 2020-06-14 (×13): qty 1

## 2020-06-14 MED ORDER — FLUOXETINE HCL 20 MG PO CAPS
40.0000 mg | ORAL_CAPSULE | Freq: Every day | ORAL | Status: DC
  Administered 2020-06-15 – 2020-06-20 (×6): 40 mg via ORAL
  Filled 2020-06-14 (×6): qty 2

## 2020-06-14 MED ORDER — ONDANSETRON HCL 4 MG/2ML IJ SOLN: 4.0000 mg | Freq: Four times a day (QID) | INTRAMUSCULAR | Status: DC | PRN

## 2020-06-14 MED ORDER — HYDRALAZINE HCL 20 MG/ML IJ SOLN
10.0000 mg | Freq: Three times a day (TID) | INTRAMUSCULAR | Status: DC | PRN
  Administered 2020-06-14: 10 mg via INTRAVENOUS
  Filled 2020-06-14: qty 1

## 2020-06-14 MED ORDER — ALBUTEROL SULFATE (2.5 MG/3ML) 0.083% IN NEBU: 2.5000 mg | INHALATION_SOLUTION | Freq: Four times a day (QID) | RESPIRATORY_TRACT | Status: DC | PRN

## 2020-06-14 MED ORDER — TAMSULOSIN HCL 0.4 MG PO CAPS
0.4000 mg | ORAL_CAPSULE | Freq: Every day | ORAL | Status: DC
  Administered 2020-06-14 – 2020-06-20 (×7): 0.4 mg via ORAL
  Filled 2020-06-14 (×7): qty 1

## 2020-06-14 MED ORDER — DULOXETINE HCL 30 MG PO CPEP
30.0000 mg | ORAL_CAPSULE | Freq: Every day | ORAL | Status: DC
  Administered 2020-06-14 – 2020-06-20 (×7): 30 mg via ORAL
  Filled 2020-06-14 (×7): qty 1

## 2020-06-14 MED ORDER — SODIUM CHLORIDE 0.9 % IV SOLN
1.0000 g | INTRAVENOUS | Status: DC
  Filled 2020-06-14 (×2): qty 10

## 2020-06-14 MED ORDER — INSULIN ASPART 100 UNIT/ML IJ SOLN
0.0000 [IU] | Freq: Three times a day (TID) | INTRAMUSCULAR | Status: DC
  Administered 2020-06-15: 2 [IU] via SUBCUTANEOUS
  Administered 2020-06-15 – 2020-06-16 (×2): 3 [IU] via SUBCUTANEOUS
  Administered 2020-06-17 – 2020-06-20 (×5): 2 [IU] via SUBCUTANEOUS

## 2020-06-14 NOTE — ED Provider Notes (Signed)
[facility] EMERGENCY DEPARTMENT Provider Note  CSN: [id number] Arrival date & time: 06/14/20 0750    History Chief Complaint  Patient presents with   Weakness     Weakness  Mark Russell is a 85 y.o. male with recent diagnosis of Covid about 2 weeks ago was admitted for hypoxia 6/2 and discharged home 6/6. He is not able to give a clear history of why EMS was called to bring him back today. He lives with his son who was not available by phone, but I spoke with his daughter who states he has been very weak, confused, not eating or drinking much since discharge. She thinks he may have gone home too early. He has had several episodes of diarrhea and stool incontinence recently. She states at his baseline he is very independent despite his bilateral AKA, gets around in a motorized wheelchair, cooks for himself and transitions from chair to bed/toilet independently. He has not been able to do any of those things since coming home.    Past Medical History:  Diagnosis Date   Anxiety    Avascular necrosis (HCC)    BPH (benign prostatic hyperplasia)    Depression    Diabetes mellitus    DVT (deep venous thrombosis) (HCC)    GERD (gastroesophageal reflux disease)    History of leg amputation (HCC)    both legs   Hypertension    Neuropathy    Peripheral vascular disease (HCC)    Sleep apnea     Past Surgical History:  Procedure Laterality Date   ABDOMINAL AORTIC ANEURYSM REPAIR  1988   BACK SURGERY  1988, 1989   BALLOON DILATION N/A 04/27/2012   Procedure: BALLOON DILATION;  Surgeon: Shirley Friar, MD;  Location: [facility];  Service: Endoscopy;  Laterality: N/A;  no xray   BYPASS GRAFT     ESOPHAGOGASTRODUODENOSCOPY N/A 04/27/2012   Procedure: ESOPHAGOGASTRODUODENOSCOPY (EGD);  Surgeon: Shirley Friar, MD;  Location: [facility];  Service: Endoscopy;  Laterality: N/A;   LEG AMPUTATION THROUGH KNEE  06/2004    Family History  Problem Relation Age of Onset   Heart  attack Other    Cancer Other    Cancer Other    Heart attack Other    Esophageal cancer Daughter        Living, 51    Social History   Tobacco Use   Smoking status: Former    Packs/day: 0.25    Years: 30.00    Pack years: 7.50    Types: Cigarettes    Quit date: 08/06/1991    Years since quitting: 28.8   Smokeless tobacco: Never  Substance Use Topics   Alcohol use: No     Home Medications Prior to Admission medications   Medication Sig Start Date End Date Taking? Authorizing Provider  acetaminophen (TYLENOL) 325 MG tablet Take 650 mg by mouth every 6 (six) hours as needed for mild pain, fever or headache.    [provider]  albuterol (VENTOLIN HFA) 108 (90 Base) MCG/ACT inhaler Inhale 2 puffs into the lungs every 6 (six) hours as needed for wheezing or shortness of breath. 06/10/20   Burnadette Pop, MD  amLODipine (NORVASC) 5 MG tablet Take 1 tablet (5 mg total) by mouth daily. 06/10/20 06/10/21  Burnadette Pop, MD  Ascorbic Acid (VITAMIN C PO) Take 1 tablet by mouth daily.    [provider]  Cholecalciferol (VITAMIN D) 50 MCG (2000 UT) tablet Take 2,000 Units by mouth daily.  [provider]  donepezil (ARICEPT) 10 MG tablet Take 10 mg by mouth at bedtime. 04/08/20   [provider]  DULoxetine (CYMBALTA) 30 MG capsule Take 30 mg by mouth daily. 07/15/15   [provider]  fentaNYL (DURAGESIC - DOSED MCG/HR) 25 MCG/HR patch Place 1 patch onto the skin every other day. 07/15/15   [provider]  FLUoxetine (PROZAC) 20 MG capsule Take 40 mg by mouth daily.    [provider]  gabapentin (NEURONTIN) 300 MG capsule Take 900 mg by mouth 3 (three) times daily. 06/07/15   [provider]  glimepiride (AMARYL) 4 MG tablet Take 4 mg by mouth daily. 05/24/20   [provider]  guaiFENesin-dextromethorphan (ROBITUSSIN DM) 100-10 MG/5ML syrup Take 10 mLs by mouth every 4 (four) hours as needed for cough. 06/10/20    Burnadette Pop, MD  Menthol, Topical Analgesic, 2 % GEL Apply 1 application topically 3 (three) times daily as needed (for extremity pain).    [provider]  metoprolol tartrate (LOPRESSOR) 25 MG tablet Take 1 tablet (25 mg total) by mouth 2 (two) times daily. 06/10/20 06/10/21  Burnadette Pop, MD  mineral oil-hydrophilic petrolatum (AQUAPHOR) ointment Apply 1 application topically as needed (bed sores).    [provider]  PERCOCET 7.5-325 MG tablet Take 1 tablet by mouth every 6 (six) hours as needed for pain. 05/22/20   [provider]  potassium chloride (KLOR-CON) 10 MEQ tablet Take 1 tablet (10 mEq total) by mouth daily for 14 days. Patient taking differently: Take 10 mEq by mouth 2 (two) times daily. 06/01/20 06/15/20  Placido Sou, PA-C  predniSONE (DELTASONE) 10 MG tablet Take 1 tablet (10 mg total) by mouth daily. Take 4 pills daily for 2 days then 3 pills daily 2 days then 2 pills daily for 2 days then 1 pill daily for 2 days then stop 06/10/20   Burnadette Pop, MD  simvastatin (ZOCOR) 40 MG tablet Take 40 mg by mouth every evening.    [provider]  SYMBICORT 160-4.5 MCG/ACT inhaler Inhale 3 puffs into the lungs every 6 (six) hours as needed for wheezing or shortness of breath. 05/24/20   [provider]  tamsulosin (FLOMAX) 0.4 MG CAPS capsule Take 0.4 mg by mouth daily. 05/24/20   [provider]  warfarin (COUMADIN) 4 MG tablet Take 2-4 mg by mouth See admin instructions. 2mg  on Monday Wendesday Friday and 4mg  on all other days    [provider]  zinc gluconate 50 MG tablet Take 50 mg by mouth daily.    [provider]     Allergies    Ace inhibitors, Dilaudid [hydromorphone hcl], Metformin and related, Morphine and related, and Pletal [cilostazol]   Review of Systems   Review of Systems  Neurological:  Positive for weakness.  A comprehensive review of systems was completed and negative except as noted in  HPI.    Physical Exam BP (!) 187/91   Pulse 62   Temp 98.2 F (36.8 C) (Oral)   Resp 14   SpO2 93%   Physical Exam Vitals and nursing note reviewed.  Constitutional:      Appearance: Normal appearance.  HENT:     Head: Normocephalic and atraumatic.     Comments: There is an area of skin breakdown, erythema, and tenderness behind R ear, see photo    Nose: Nose normal.     Mouth/Throat:     Mouth: Mucous membranes are dry.  Eyes:  Extraocular Movements: Extraocular movements intact.     Conjunctiva/sclera: Conjunctivae normal.  Cardiovascular:     Rate and Rhythm: Normal rate.  Pulmonary:     Effort: Pulmonary effort is normal.     Breath sounds: Normal breath sounds.  Abdominal:     General: Abdomen is flat.     Palpations: Abdomen is soft.     Tenderness: There is no abdominal tenderness.     Comments: R lateral and umbilical hernias, not tender  Musculoskeletal:        General: No swelling. Normal range of motion.     Cervical back: Neck supple.     Comments: Bilateral AKA  Skin:    General: Skin is warm and dry.  Neurological:     General: No focal deficit present.     Mental Status: He is alert.     Comments: Oriented but gives very vague answers to most questions and does not know why he is here  Psychiatric:        Mood and Affect: Mood normal.      ED Results / Procedures / Treatments   Labs (all labs ordered are listed, but only abnormal results are displayed) Labs Reviewed  COMPREHENSIVE METABOLIC PANEL - Abnormal; Notable for the following components:      Result Value   Chloride 95 (*)    Glucose, Bld 164 (*)    Calcium 8.2 (*)    Albumin 2.6 (*)    All other components within normal limits  CBC WITH DIFFERENTIAL/PLATELET - Abnormal; Notable for the following components:   WBC 20.6 (*)    Platelets 527 (*)    Neutro Abs 18.3 (*)    Lymphs Abs 0.6 (*)    Monocytes Absolute 1.4 (*)    Abs Immature Granulocytes 0.22 (*)    All other  components within normal limits  PROTIME-INR - Abnormal; Notable for the following components:   Prothrombin Time 72.5 (*)    INR 8.9 (*)    All other components within normal limits  URINALYSIS, ROUTINE W REFLEX MICROSCOPIC    EKG EKG Interpretation  Date/Time:  Friday June 14 2020 10:06:45 EDT Ventricular Rate:  65 PR Interval:    QRS Duration: 148 QT Interval:  473 QTC Calculation: 492 R Axis:   86 Text Interpretation: Atrial fibrillation Right bundle branch block No significant change since last tracing Confirmed by Susy Frizzle [phone]) on 06/14/2020 10:09:12 AM  Radiology CT Head Wo Contrast  Result Date: 06/14/2020 CLINICAL DATA:  Mental status change with unknown cause. Retro auricular cellulitis on the right EXAM: CT HEAD WITHOUT CONTRAST TECHNIQUE: Contiguous axial images were obtained from the base of the skull through the vertex without intravenous contrast. COMPARISON:  06/06/2020 FINDINGS: Brain: Generalized atrophy. Remote infarct at the high left frontal parietal junction where there is asymmetric gyral narrowing. Small remote left cerebellar infarction. Vascular: No hyperdense vessel or unexpected calcification. Skull: History of right retroauricular cellulitis correlating with new subcutaneous reticulation. No discrete rounded area to imply abscess. No soft tissue gas. Sinuses/Orbits: Continued sinus inflammation with bilateral maxillary and sphenoid fluid levels, progressed from prior. Bilateral cataract resection. IMPRESSION: 1. No acute intracranial finding. 2. Right retroauricular cellulitis without complicating features. 3. Progressive sinusitis with bilateral fluid level. Electronically Signed   By: Marnee Spring M.D.   On: 06/14/2020 10:27   DG Chest Port 1 View  Result Date: 06/14/2020 CLINICAL DATA:  Cough and weakness EXAM: PORTABLE CHEST 1 VIEW COMPARISON:  June 06, 2020  FINDINGS: There is a 7 mm nodular opacity in the left upper lobe. There is bibasilar  atelectasis. There has been interval clearing of infiltrate from the right base. There is mild atelectasis in the left base. The heart is upper normal in size with pulmonary vascularity normal. No adenopathy. There is aortic atherosclerosis. No bone lesions. IMPRESSION: 1.  Interval clearing of infiltrate from the right base. 2. 7 mm nodular opacity left upper lobe. Correlation noncontrast enhanced chest CT advised given this nodular opacity. 3.  Atelectasis left base. 4. Stable cardiac silhouette. Aortic Atherosclerosis (ICD10-I70.0). Electronically Signed   By: Bretta Bang III M.D.   On: 06/14/2020 09:27    Procedures Procedures  Medications Ordered in the ED Medications  phytonadione (VITAMIN K) 1 mg in dextrose 5 % 50 mL IVPB (1 mg Intravenous New Bag/Given 06/14/20 1034)  cefTRIAXone (ROCEPHIN) 1 g in sodium chloride 0.9 % 100 mL IVPB (1 g Intravenous New Bag/Given 06/14/20 1051)     MDM Rules/Calculators/A&P MDM  Patient here with poor functional status at home since discharge. Some confusion above baseline. Has a new skin breakdown behind R ear, unclear etiology. Will check labs/imaging and reassess.   ED Course  I have reviewed the triage vital signs and the nursing notes.  Pertinent labs & imaging results that were available during my care of the patient were reviewed by me and considered in my medical decision making (see chart for details).  Clinical Course as of 06/14/20 1124  Fri Jun 14, 2020  7342 CBC shows increased WBC since recent admission, still on Prednisone taper.  [CS]  0931 CXR shows improved infiltrate. There is an incidental nodule that will require follow up CT.  [CS]  0941 CMP is unremarkable.  [CS]  0955 INR is supratherapeutic. Hold coumadin. No indication of active bleeding. Vitamin K per non-urgent reversal order set.  [CS]  1032 CT shows retroauricular cellulitis, otherwise no acute process. Given increased leukocytosis, will begin IV abx. Plan  discussion with Hospitalist for admission.  [CS]  1112 Spoke with Dr. Ronaldo Miyamoto, Hospitalist, who will evaluate for admission.  [CS]    Clinical Course User Index [CS] Pollyann Savoy, MD    Final Clinical Impression(s) / ED Diagnoses Final diagnoses:  Weakness  Supratherapeutic INR  Cellulitis of head except face    Rx / DC Orders ED Discharge Orders     None        Pollyann Savoy, MD 06/14/20 1124

## 2020-06-14 NOTE — ED Triage Notes (Signed)
BIBA Per EMS: Pt coming from home with complaints of weakness, confusion and family concern for inability to care for self. Pt had home health and got rid of them, but unable to care for self.  Pt covid pos x10 days ago, d/c from hospital  A&Ox4  60- 90 AFIB HR  180 CBG  142/60  98% RA 16RR

## 2020-06-14 NOTE — H&P (Signed)
History and Physical    Mark Russell LPF:[id number] DOB: March 10, 1935 DOA: 06/14/2020  PCP: Maurice Small, MD  Patient coming from: Home  Chief Complaint: confusion, weakness  HPI: Mark Russell is a 85 y.o. male with medical history significant of PVD, DVT on anticoagulation, a fib, HTN, DM2, recent COVID PNA w/ hypoxia. Presenting with weakness and confusion. Patient is giving short, vague answers. He is unable to tell me his history. History is from daughter. She reports that the patient has never fully recovered since coming home for his recent COVID infection. The patient lives with his son now and they note that he has been weaker and weaker. He is incontinent of bowel and bladder. He is having several episodes of diarrhea. Overall, he seems more confused. These symptoms progressed through this morning. The family became concerned and brought him to the ED. They deny any other aggravating or alleviating factors.   ED Course: He was found to have an elevated white count. CTH was only notable for progressively worsening sinusitis. It was noted that he had a right periauricular ulceration, so he was started on rocephin. TRH was called for admission.   Review of Systems:  Denies CP, dyspnea, palpitations, N/V/D, fevers. Review of systems is otherwise negative for all not mentioned in HPI.   PMHx Past Medical History:  Diagnosis Date   Anxiety    Avascular necrosis (HCC)    BPH (benign prostatic hyperplasia)    Depression    Diabetes mellitus    DVT (deep venous thrombosis) (HCC)    GERD (gastroesophageal reflux disease)    History of leg amputation (HCC)    both legs   Hypertension    Neuropathy    Peripheral vascular disease (HCC)    Sleep apnea     PSHx Past Surgical History:  Procedure Laterality Date   ABDOMINAL AORTIC ANEURYSM REPAIR  1988   BACK SURGERY  1988, 1989   BALLOON DILATION N/A 04/27/2012   Procedure: BALLOON DILATION;  Surgeon: Shirley Friar, MD;   Location: [facility];  Service: Endoscopy;  Laterality: N/A;  no xray   BYPASS GRAFT     ESOPHAGOGASTRODUODENOSCOPY N/A 04/27/2012   Procedure: ESOPHAGOGASTRODUODENOSCOPY (EGD);  Surgeon: Shirley Friar, MD;  Location: [facility];  Service: Endoscopy;  Laterality: N/A;   LEG AMPUTATION THROUGH KNEE  06/2004    SocHx  reports that he quit smoking about 28 years ago. His smoking use included cigarettes. He has a 7.50 pack-year smoking history. He has never used smokeless tobacco. He reports that he does not drink alcohol. No history on file for drug use.  Allergies  Allergen Reactions   Ace Inhibitors     hypotension   Dilaudid [Hydromorphone Hcl]     "Mean"   Metformin And Related     GI upset   Morphine And Related Other (See Comments)    "mean"   Pletal [Cilostazol]     SOB    FamHx Family History  Problem Relation Age of Onset   Heart attack Other    Cancer Other    Cancer Other    Heart attack Other    Esophageal cancer Daughter        Living, 76    Prior to Admission medications   Medication Sig Start Date End Date Taking? Authorizing Provider  acetaminophen (TYLENOL) 325 MG tablet Take 650 mg by mouth every 6 (six) hours as needed for mild pain, fever or headache.   Yes [provider]  albuterol (  VENTOLIN HFA) 108 (90 Base) MCG/ACT inhaler Inhale 2 puffs into the lungs every 6 (six) hours as needed for wheezing or shortness of breath. 06/10/20  Yes Burnadette Pop, MD  amLODipine (NORVASC) 5 MG tablet Take 1 tablet (5 mg total) by mouth daily. 06/10/20 06/10/21 Yes Burnadette Pop, MD  Ascorbic Acid (VITAMIN C PO) Take 1 tablet by mouth daily.   Yes [provider]  bismuth subsalicylate (PEPTO BISMOL) 262 MG/15ML suspension Take 30 mLs by mouth every 6 (six) hours as needed for diarrhea or loose stools.   Yes [provider]  Cholecalciferol (VITAMIN D) 50 MCG (2000 UT) tablet Take 2,000 Units by mouth daily.   Yes [provider]   donepezil (ARICEPT) 10 MG tablet Take 10 mg by mouth at bedtime. 04/08/20  Yes [provider]  DULoxetine (CYMBALTA) 30 MG capsule Take 30 mg by mouth daily. 07/15/15  Yes [provider]  fentaNYL (DURAGESIC - DOSED MCG/HR) 25 MCG/HR patch Place 1 patch onto the skin every other day. 07/15/15  Yes [provider]  FLUoxetine (PROZAC) 20 MG capsule Take 40 mg by mouth daily.   Yes [provider]  gabapentin (NEURONTIN) 300 MG capsule Take 900 mg by mouth 3 (three) times daily. 06/07/15  Yes [provider]  glimepiride (AMARYL) 4 MG tablet Take 4 mg by mouth daily. 05/24/20  Yes [provider]  guaiFENesin-dextromethorphan (ROBITUSSIN DM) 100-10 MG/5ML syrup Take 10 mLs by mouth every 4 (four) hours as needed for cough. 06/10/20  Yes Burnadette Pop, MD  loperamide (IMODIUM) 2 MG capsule Take by mouth as needed for diarrhea or loose stools.   Yes [provider]  Menthol, Topical Analgesic, 2 % GEL Apply 1 application topically 3 (three) times daily as needed (for extremity pain).   Yes [provider]  metoprolol tartrate (LOPRESSOR) 25 MG tablet Take 1 tablet (25 mg total) by mouth 2 (two) times daily. Patient taking differently: Take 50 mg by mouth daily. 06/10/20 06/10/21 Yes Burnadette Pop, MD  mineral oil-hydrophilic petrolatum (AQUAPHOR) ointment Apply 1 application topically as needed (bed sores).   Yes [provider]  PERCOCET 7.5-325 MG tablet Take 1 tablet by mouth every 6 (six) hours as needed for pain. 05/22/20  Yes [provider]  potassium chloride (KLOR-CON) 10 MEQ tablet Take 1 tablet (10 mEq total) by mouth daily for 14 days. Patient taking differently: Take 10 mEq by mouth 2 (two) times daily. 06/01/20 06/15/20 Yes Placido Sou, PA-C  predniSONE (DELTASONE) 10 MG tablet Take 1 tablet (10 mg total) by mouth daily. Take 4 pills daily for 2 days then 3 pills daily 2 days then 2 pills daily for 2 days  then 1 pill daily for 2 days then stop 06/10/20  Yes Adhikari, Willia Craze, MD  simvastatin (ZOCOR) 40 MG tablet Take 40 mg by mouth every evening.   Yes [provider]  SYMBICORT 160-4.5 MCG/ACT inhaler Inhale 3 puffs into the lungs every 6 (six) hours as needed for wheezing or shortness of breath. 05/24/20  Yes [provider]  tamsulosin (FLOMAX) 0.4 MG CAPS capsule Take 0.4 mg by mouth daily. 05/24/20  Yes [provider]  warfarin (COUMADIN) 4 MG tablet Take 2-4 mg by mouth See admin instructions. 2mg  on Monday Wendesday Friday and 4mg  on all other days   Yes [provider]  zinc gluconate 50 MG tablet Take 50 mg by mouth daily.   Yes [provider]    Physical  Exam: Vitals:   06/14/20 0800 06/14/20 0805 06/14/20 0900 06/14/20 1051  BP: (!) 154/72  101/84 (!) 187/91  Pulse:  63 65 62  Resp: (!) 9 17 19 14   Temp:  98.2 F (36.8 C)    TempSrc:  Oral    SpO2: 92% 93% 95% 93%    General: 85 y.o. male resting in bed in NAD Eyes: PERRL, normal sclera ENMT: Nares patent w/o discharge, orophaynx clear, dentition normal, ears w/o discharge/, there is a right periauricular lesion Neck: Supple, trachea midline Cardiovascular: RRR, +S1, S2, no m/g/r, equal pulses throughout Respiratory: upper airway transmission, scattered wheeze normal WOB GI: BS+, NDNT, no masses noted, no organomegaly noted MSK: No e/c/c; B AKA Skin: No rashes, bruises, ulcerations noted Neuro: A&O x name, place, no focal deficits Psyc: short vague answers, calm/cooperative  Labs on Admission: I have personally reviewed following labs and imaging studies  CBC: Recent Labs  Lab 06/08/20 0316 06/09/20 0802 06/10/20 0356 06/14/20 0915  WBC 12.5* 12.6* 12.3* 20.6*  NEUTROABS 10.7* 10.2* 11.1* 18.3*  HGB 14.6 15.0 14.8 14.1  HCT 46.7 47.8 47.2 45.0  MCV 94.2 94.5 93.8 93.8  PLT 340 375 381 527*   Basic Metabolic Panel: Recent Labs  Lab 06/08/20 0316 06/09/20 0802  06/10/20 0356 06/14/20 0915  NA 135 136 134* 136  K 3.5 3.8 4.0 3.7  CL 95* 94* 97* 95*  CO2 30 33* 28 32  GLUCOSE 106* 84 154* 164*  BUN 16 15 16 12   CREATININE 0.55* 0.67 0.70 0.74  CALCIUM 8.3* 8.5* 8.3* 8.2*   GFR: Estimated Creatinine Clearance: 48.7 mL/min (by C-G formula based on SCr of 0.74 mg/dL). Liver Function Tests: Recent Labs  Lab 06/08/20 0316 06/09/20 0802 06/10/20 0356 06/14/20 0915  AST 34 38 27 19  ALT 23 28 31 21   ALKPHOS 37* 40 42 52  BILITOT 0.7 0.7 0.6 0.7  PROT 7.0 7.4 7.1 6.7  ALBUMIN 2.9* 3.0* 3.0* 2.6*   No results for input(s): LIPASE, AMYLASE in the last 168 hours. No results for input(s): AMMONIA in the last 168 hours. Coagulation Profile: Recent Labs  Lab 06/08/20 0316 06/09/20 0802 06/10/20 0356 06/14/20 0915  INR 1.3* 1.8* 2.4* 8.9*   Cardiac Enzymes: No results for input(s): CKTOTAL, CKMB, CKMBINDEX, TROPONINI in the last 168 hours. BNP (last 3 results) No results for input(s): PROBNP in the last 8760 hours. HbA1C: No results for input(s): HGBA1C in the last 72 hours. CBG: Recent Labs  Lab 06/09/20 1212 06/09/20 1652 06/09/20 2126 06/10/20 0751 06/10/20 1214  GLUCAP 124* 191* 206* 180* 130*   Lipid Profile: No results for input(s): CHOL, HDL, LDLCALC, TRIG, CHOLHDL, LDLDIRECT in the last 72 hours. Thyroid Function Tests: No results for input(s): TSH, T4TOTAL, FREET4, T3FREE, THYROIDAB in the last 72 hours. Anemia Panel: No results for input(s): VITAMINB12, FOLATE, FERRITIN, TIBC, IRON, RETICCTPCT in the last 72 hours. Urine analysis:    Component Value Date/Time   COLORURINE AMBER (A) 06/06/2020 1253   APPEARANCEUR CLEAR 06/06/2020 1253   LABSPEC 1.040 (H) 06/06/2020 1253   PHURINE 5.0 06/06/2020 1253   GLUCOSEU NEGATIVE 06/06/2020 1253   HGBUR SMALL (A) 06/06/2020 1253   BILIRUBINUR NEGATIVE 06/06/2020 1253   KETONESUR 5 (A) 06/06/2020 1253   PROTEINUR >=300 (A) 06/06/2020 1253   UROBILINOGEN 1.0 08/06/2011  1157   NITRITE NEGATIVE 06/06/2020 1253   LEUKOCYTESUR NEGATIVE 06/06/2020 1253    Radiological Exams on Admission: CT Head Wo Contrast  Result Date: 06/14/2020  CLINICAL DATA:  Mental status change with unknown cause. Retro auricular cellulitis on the right EXAM: CT HEAD WITHOUT CONTRAST TECHNIQUE: Contiguous axial images were obtained from the base of the skull through the vertex without intravenous contrast. COMPARISON:  06/06/2020 FINDINGS: Brain: Generalized atrophy. Remote infarct at the high left frontal parietal junction where there is asymmetric gyral narrowing. Small remote left cerebellar infarction. Vascular: No hyperdense vessel or unexpected calcification. Skull: History of right retroauricular cellulitis correlating with new subcutaneous reticulation. No discrete rounded area to imply abscess. No soft tissue gas. Sinuses/Orbits: Continued sinus inflammation with bilateral maxillary and sphenoid fluid levels, progressed from prior. Bilateral cataract resection. IMPRESSION: 1. No acute intracranial finding. 2. Right retroauricular cellulitis without complicating features. 3. Progressive sinusitis with bilateral fluid level. Electronically Signed   By: Marnee Spring M.D.   On: 06/14/2020 10:27   DG Chest Port 1 View  Result Date: 06/14/2020 CLINICAL DATA:  Cough and weakness EXAM: PORTABLE CHEST 1 VIEW COMPARISON:  June 06, 2020 FINDINGS: There is a 7 mm nodular opacity in the left upper lobe. There is bibasilar atelectasis. There has been interval clearing of infiltrate from the right base. There is mild atelectasis in the left base. The heart is upper normal in size with pulmonary vascularity normal. No adenopathy. There is aortic atherosclerosis. No bone lesions. IMPRESSION: 1.  Interval clearing of infiltrate from the right base. 2. 7 mm nodular opacity left upper lobe. Correlation noncontrast enhanced chest CT advised given this nodular opacity. 3.  Atelectasis left base. 4. Stable  cardiac silhouette. Aortic Atherosclerosis (ICD10-I70.0). Electronically Signed   By: Bretta Bang III M.D.   On: 06/14/2020 09:27    EKG: Independently reviewed. A fib w/ RBBB, no st elevation  Assessment/Plan Right periauricular cellulitis     - admit to inpt, tele     - continue rocephin for now     - add guaifenesin   Diarrhea     - check stool studies     - enteric precautions  Supratherapeutic INR w/o evidence of bleed Hx of DVT on coumadin Paroxysmal a fib     - given Vit K in ED; rpt INR in AM; hold coumadin     - continue metoprolol  Generalized weakness Debility     - last admission PT made recommendation for HHPT, but patient declined; will ask for re-eval  Recent COVID PNA w/ hypoxia      - s/p hospitalization w/ treatment     - still inside 21-day isolation period; continue airborne precautions  COPD     - continue home inhalers  DM2     - SSI, DM2 diet, glucose checks  PVD s/p BAKA HLD     - continue statin  HTN     - continue home regimen  DVT prophylaxis: held, he's supratherapeutic on his INR  Code Status: FULL  Family Communication: spoke w/ dtr by phone  Consults called: none   Status is: Inpatient  Remains inpatient appropriate because:Inpatient level of care appropriate due to severity of illness  Dispo: The patient is from: Home              Anticipated d/c is to:  TBD              Patient currently is not medically stable to d/c.   Difficult to place patient No  Time spent coordinating admission: 70 minutes.  Teddy Spike DO [facility]  If 7PM-7AM, please contact night-coverage [email or web address]  06/14/2020, 12:07 PM

## 2020-06-14 NOTE — ED Notes (Signed)
Patient continuing to pull off BP cuff and O2 monitor. This RN continued to educate pt on necessity to keep on equipment. Pt placed in mittens for safety.

## 2020-06-14 NOTE — Plan of Care (Signed)
  Problem: Education: Goal: Knowledge of General Education information will improve Description: Including pain rating scale, medication(s)/side effects and non-pharmacologic comfort measures Outcome: Progressing   Problem: Health Behavior/Discharge Planning: Goal: Ability to manage health-related needs will improve Outcome: Progressing   Problem: Clinical Measurements: Goal: Ability to maintain clinical measurements within normal limits will improve Outcome: Progressing Goal: Will remain free from infection Outcome: Progressing Goal: Diagnostic test results will improve Outcome: Progressing Goal: Respiratory complications will improve Outcome: Progressing Goal: Cardiovascular complication will be avoided Outcome: Progressing   Problem: Activity: Goal: Risk for activity intolerance will decrease Outcome: Progressing   Problem: Nutrition: Goal: Adequate nutrition will be maintained Outcome: Progressing   Problem: Coping: Goal: Level of anxiety will decrease Outcome: Progressing   Problem: Elimination: Goal: Will not experience complications related to bowel motility Outcome: Progressing Goal: Will not experience complications related to urinary retention Outcome: Progressing   Problem: Pain Managment: Goal: General experience of comfort will improve Outcome: Progressing   Problem: Safety: Goal: Ability to remain free from injury will improve Outcome: Progressing   Problem: Skin Integrity: Goal: Risk for impaired skin integrity will decrease Outcome: Progressing   Problem: Education: Goal: Knowledge of risk factors and measures for prevention of condition will improve Outcome: Progressing   Problem: Respiratory: Goal: Complications related to the disease process, condition or treatment will be avoided or minimized Outcome: Progressing   

## 2020-06-14 NOTE — ED Notes (Signed)
Condom catheter placed on pt. Pt aware on need of urine sample

## 2020-06-15 DIAGNOSIS — E785 Hyperlipidemia, unspecified: Secondary | ICD-10-CM

## 2020-06-15 DIAGNOSIS — L989 Disorder of the skin and subcutaneous tissue, unspecified: Secondary | ICD-10-CM

## 2020-06-15 DIAGNOSIS — R911 Solitary pulmonary nodule: Secondary | ICD-10-CM

## 2020-06-15 DIAGNOSIS — I10 Essential (primary) hypertension: Secondary | ICD-10-CM

## 2020-06-15 DIAGNOSIS — G8929 Other chronic pain: Secondary | ICD-10-CM

## 2020-06-15 DIAGNOSIS — I480 Paroxysmal atrial fibrillation: Secondary | ICD-10-CM

## 2020-06-15 DIAGNOSIS — R197 Diarrhea, unspecified: Secondary | ICD-10-CM

## 2020-06-15 DIAGNOSIS — R5381 Other malaise: Secondary | ICD-10-CM

## 2020-06-15 LAB — COMPREHENSIVE METABOLIC PANEL
ALT: 22 U/L (ref 0–44)
AST: 21 U/L (ref 15–41)
Albumin: 2.7 g/dL — ABNORMAL LOW (ref 3.5–5.0)
Alkaline Phosphatase: 56 U/L (ref 38–126)
Anion gap: 10 (ref 5–15)
BUN: 12 mg/dL (ref 8–23)
CO2: 31 mmol/L (ref 22–32)
Calcium: 8.2 mg/dL — ABNORMAL LOW (ref 8.9–10.3)
Chloride: 96 mmol/L — ABNORMAL LOW (ref 98–111)
Creatinine, Ser: 0.78 mg/dL (ref 0.61–1.24)
GFR, Estimated: >60 mL/min (ref >60)
Glucose, Bld: 109 mg/dL — ABNORMAL HIGH (ref 70–99)
Potassium: 3.5 mmol/L (ref 3.5–5.1)
Sodium: 137 mmol/L (ref 135–145)
Total Bilirubin: 0.9 mg/dL (ref 0.3–1.2)
Total Protein: 7.2 g/dL (ref 6.5–8.1)

## 2020-06-15 LAB — CBC
HCT: 45.9 % (ref 39.0–52.0)
Hemoglobin: 14.1 g/dL (ref 13.0–17.0)
MCH: 29.3 pg (ref 26.0–34.0)
MCHC: 30.7 g/dL (ref 30.0–36.0)
MCV: 95.4 fL (ref 80.0–100.0)
Platelets: 471 10*3/uL — ABNORMAL HIGH (ref 150–400)
RBC: 4.81 MIL/uL (ref 4.22–5.81)
RDW: 15.1 % (ref 11.5–15.5)
WBC: 19.1 10*3/uL — ABNORMAL HIGH (ref 4.0–10.5)
nRBC: 0 % (ref 0.0–0.2)

## 2020-06-15 LAB — PROTIME-INR
INR: 1.4 — ABNORMAL HIGH (ref 0.8–1.2)
Prothrombin Time: 17.1 seconds — ABNORMAL HIGH (ref 11.4–15.2)

## 2020-06-15 LAB — GLUCOSE, CAPILLARY
Glucose-Capillary: 107 mg/dL — ABNORMAL HIGH (ref 70–99)
Glucose-Capillary: 196 mg/dL — ABNORMAL HIGH (ref 70–99)
Glucose-Capillary: 139 mg/dL — ABNORMAL HIGH (ref 70–99)
Glucose-Capillary: 110 mg/dL — ABNORMAL HIGH (ref 70–99)

## 2020-06-15 MED ORDER — ENOXAPARIN SODIUM 80 MG/0.8ML IJ SOSY
80.0000 mg | PREFILLED_SYRINGE | Freq: Once | INTRAMUSCULAR | Status: AC
  Administered 2020-06-15: 80 mg via SUBCUTANEOUS
  Filled 2020-06-15: qty 0.8

## 2020-06-15 MED ORDER — WARFARIN SODIUM 4 MG PO TABS
4.0000 mg | ORAL_TABLET | Freq: Once | ORAL | Status: AC
  Administered 2020-06-15: 4 mg via ORAL
  Filled 2020-06-15: qty 1

## 2020-06-15 MED ORDER — ENOXAPARIN SODIUM 80 MG/0.8ML IJ SOSY
80.0000 mg | PREFILLED_SYRINGE | Freq: Two times a day (BID) | INTRAMUSCULAR | Status: DC
  Administered 2020-06-16 (×2): 80 mg via SUBCUTANEOUS
  Filled 2020-06-15 (×2): qty 0.8

## 2020-06-15 MED ORDER — WARFARIN - PHARMACIST DOSING INPATIENT: Freq: Every day | Status: DC

## 2020-06-15 NOTE — Assessment & Plan Note (Signed)
-   Database reviewed, on fentanyl patch 25 mcg and Percocet 7.5-325 (#120, 30d supply)

## 2020-06-15 NOTE — Assessment & Plan Note (Signed)
Continue amlodipine and metoprolol 

## 2020-06-15 NOTE — Progress Notes (Signed)
Patient pulled out IV access prior to shift start. RN attempted to start another IV so that IV antibiotics could be administered. Patient adamantly refused and told RN to remove tourniquet. Antibiotic marked as not given. Will have another RN attempt to start IV at a later time.

## 2020-06-15 NOTE — Assessment & Plan Note (Signed)
-   measured at 7 mm on CXR in the L[facility] - will need CT chest to better evaluate; could be done outpatient

## 2020-06-15 NOTE — Assessment & Plan Note (Addendum)
-   INR 8.9 on admission; s/p vit K - home coumadin dose may need adjustment; see PAF

## 2020-06-15 NOTE — Assessment & Plan Note (Addendum)
-   On Coumadin -INR supratherapeutic on admission and was also previously supratherapeutic last hospitalization - No obvious reports of bleeding but received vitamin K on admission - Continue Coumadin per pharmacy.  Okay to bridge with Lovenox given immobility state, recent covid, and history of DVT - discuss with pharmacist about home dose adjustment prior to discharge

## 2020-06-15 NOTE — Assessment & Plan Note (Addendum)
-   Right retroauricular cellulitis with no fluid collection appreciated on CT head - changed rocephin to doxy to complete an empiric course - wound now looks more purulent/milky drainage and he continues to pull dressing off. Appearance has some basal cell features also. I will ask for ENT input and to see if removal and/or biopsy is an option while here; greatly appreciate assistance

## 2020-06-15 NOTE — Progress Notes (Addendum)
ANTICOAGULATION CONSULT NOTE - Initial Consult  Pharmacy Consult for warfarin with enoxaparin bridge Indication: atrial fibrillation and history of VTE  Allergies  Allergen Reactions   Ace Inhibitors     hypotension   Dilaudid [Hydromorphone Hcl]     "Mean"   Metformin And Related     GI upset   Morphine And Related Other (See Comments)    "mean"   Pletal [Cilostazol]     SOB    Patient Measurements: Height: 4' (121.9 cm) Weight: 81.8 kg (180 lb 5.4 oz) IBW/kg (Calculated) : 22.4  Vital Signs:    Labs: Recent Labs    06/14/20 0915 06/15/20 0427  HGB 14.1 14.1  HCT 45.0 45.9  PLT 527* 471*  LABPROT 72.5* 17.1*  INR 8.9* 1.4*  CREATININE 0.74 0.78    Estimated Creatinine Clearance: 45.7 mL/min (by C-G formula based on SCr of 0.78 mg/dL).   Medical History: Past Medical History:  Diagnosis Date   Anxiety    Avascular necrosis (HCC)    BPH (benign prostatic hyperplasia)    Depression    Diabetes mellitus    DVT (deep venous thrombosis) (HCC)    GERD (gastroesophageal reflux disease)    History of leg amputation (HCC)    both legs   Hypertension    Neuropathy    Peripheral vascular disease (HCC)    Sleep apnea     Medications: Warfarin PTA for history of atrial fibrillation and DVT  Home dose (confirmed with patients son): Warfarin 2 mg (1/2 tablet) MWF, 4 mg (1 tablet) all other days of the week Last dose: 6/9 PM  INR on admission (6/10) = 8.9 was SUPRAtherapeutic  Significant Events: Vitamin K 1 mg IV given 6/10  Note recent admission from 6/2 - 6/6 with supratherapeutic INR on admission (8.1).   Assessment: Pt is an 62 yoM with PMH significant for afib and DVT for which is takes warfarin chronically. Recently admitted with COVID PNA, returned to the ED with confusion, weakness. INR supratherapeutic on admission, no signs of bleeding, given a dose of vitamin K in ED 6/10.  Pharmacy consulted to dose warfarin while inpatient, with enoxaparin  bridge while INR subtherapeutic given history of VTE.   Today, 06/15/20 INR = 1.4 is subtherapeutic s/p vitamin K  CBC: Hgb WNL, Plt elevated SCr 0.78, CrCl ~46 mL/min Diet: Carb modified, 50% of meal intake charted Drug interactions: None significant   Goal of Therapy:  INR 2-3 Monitor platelets by anticoagulation protocol: Yes   Plan:  Enoxaparin 1 mg/kg subQ q12h while INR <2 Warfarin 4 mg PO once this evening INR daily CBC with AM labs tomorrow, monitor renal function  Anticipate patient will likely need to discharge on lower warfarin dose than taking PTA with recommended outpatient INR check within 72 hours of discharge  Cindi Carbon, PharmD, BCPS 06/15/2020,1:07 PM

## 2020-06-15 NOTE — Evaluation (Signed)
Physical Therapy Evaluation Patient Details Name: Mark Russell MRN: [id number] DOB: February 04, 1935 Today's Date: 06/15/2020   History of Present Illness  Mark Russell is a 85 y.o. male with medical history significant of PVD, DVT on anticoagulation, a fib, HTN, DM2, recent COVID PNA w/ hypoxia. Presenting with weakness and confusion.He was recently d/c from [facility] on 06/10/20.  Clinical Impression  Pt admitted with above diagnosis.  Pt currently with functional limitations due to the deficits listed below (see PT Problem List). Pt will benefit from skilled PT to increase their independence and safety with mobility to allow discharge to the venue listed below.  Pt would benefit from PT to work towards safer transfers.  Do not feel he will need PT at time of d/c.     Follow Up Recommendations Supervision for mobility/OOB    Equipment Recommendations  None recommended by PT    Recommendations for Other Services       Precautions / Restrictions Precautions Precautions: Fall      Mobility  Bed Mobility   Bed Mobility: Supine to Sit     Supine to sit: Mod assist;HOB elevated     General bed mobility comments: needed A to get trunk upright.  Able to rotate hips to EOB    Transfers Overall transfer level: Needs assistance   Transfers: Lateral/Scoot Transfers          Lateral/Scoot Transfers: Min guard;+2 safety/equipment General transfer comment: Transferred to drop arm recliner on residual limbs and then rotated hips.  +2 for safety. normally uses a transfer board.  Ambulation/Gait             General Gait Details: nonambulatory  Stairs            Wheelchair Mobility    Modified Rankin (Stroke Patients Only)       Balance Overall balance assessment: Needs assistance Sitting-balance support: Bilateral upper extremity supported Sitting balance-Leahy Scale: Fair                                       Pertinent Vitals/Pain Pain Assessment:  No/denies pain    Home Living Family/patient expects to be discharged to:: Private residence Living Arrangements: Children Available Help at Discharge: Family Type of Home: House Home Access: Ramped entrance     Home Layout: One level Home Equipment: Tub bench      Prior Function Level of Independence: Independent with assistive device(s)         Comments: Independenet at wc level.     Hand Dominance   Dominant Hand: Right    Extremity/Trunk Assessment   Upper Extremity Assessment Upper Extremity Assessment: Overall WFL for tasks assessed    Lower Extremity Assessment Lower Extremity Assessment:  (B AKA)    Cervical / Trunk Assessment Cervical / Trunk Assessment: Normal  Communication   Communication: No difficulties  Cognition Arousal/Alertness: Awake/alert Behavior During Therapy: WFL for tasks assessed/performed Overall Cognitive Status: Within Functional Limits for tasks assessed                                        General Comments      Exercises     Assessment/Plan    PT Assessment Patient needs continued PT services  PT Problem List Decreased mobility;Decreased balance       PT Treatment  Interventions DME instruction;Therapeutic exercise;Balance training;Functional mobility training;Therapeutic activities;Patient/family education    PT Goals (Current goals can be found in the Care Plan section)  Acute Rehab PT Goals Patient Stated Goal: home- is not interested in rehab PT Goal Formulation: With patient Time For Goal Achievement: 06/29/20 Potential to Achieve Goals: Good    Frequency Min 3X/week   Barriers to discharge        Co-evaluation               AM-PAC PT "6 Clicks" Mobility  Outcome Measure Help needed turning from your back to your side while in a flat bed without using bedrails?: A Little Help needed moving from lying on your back to sitting on the side of a flat bed without using bedrails?: A  Little Help needed moving to and from a bed to a chair (including a wheelchair)?: A Little Help needed standing up from a chair using your arms (e.g., wheelchair or bedside chair)?: Total Help needed to walk in hospital room?: Total Help needed climbing 3-5 steps with a railing? : Total 6 Click Score: 12    End of Session   Activity Tolerance: Patient tolerated treatment well Patient left: in chair;with call bell/phone within reach Nurse Communication: Mobility status PT Visit Diagnosis: Other abnormalities of gait and mobility (R26.89);Muscle weakness (generalized) (M62.81)    Time: 6378-5885 PT Time Calculation (min) (ACUTE ONLY): 18 min   Charges:   PT Evaluation $PT Eval Low Complexity: 1 Low          Barney Russomanno L. Katrinka Blazing, PT  06/15/2020   Enzo Montgomery 06/15/2020, 4:19 PM

## 2020-06-15 NOTE — Assessment & Plan Note (Addendum)
Continue statin. 

## 2020-06-15 NOTE — Assessment & Plan Note (Signed)
-   patient symptoms include AMS - etiology considered due to toxic/metabolic derangements from possible accumulation of home opioid use and dehydration from GI losses -Patient now alert and oriented again - Avoid oversedation as able

## 2020-06-15 NOTE — Final Progress Note (Signed)
Progress Note    Mark SeverinWilfred Russell   ZOX:[id number]  DOB: February 21, 1935  DOA: 06/14/2020     1  PCP: Maurice SmallGriffin, Elaine, MD  CC: weakness  Hospital Course: Mark SeverinWilfred Welke is a 85 y.o. male with medical history significant of PVD s/p B/L AKA (2007), DVT, a fib (on coumadin), HTN, DM2, recent COVID PNA (06/01/20) w/ hypoxia.  He was brought to the ER by his son after he developed worsening weakness and confusion at home.  There was also reported that his breathing was slowed and congested.  Family was concerned for COVID worsening.  He also developed diarrhea and was incontinent of stool over the past 2 to 3 days. In addition, he also developed a worsening wound on his right ear.  He denied picking at it nor any purulent drainage. CT head was ordered on admission which showed right retroauricular cellulitis with no obvious evidence of underlying abscess or fluid collection.  Progressive sinusitis noted with bilateral fluid levels.  No other acute findings. CXR showed interval clearing of infiltrate from his right base and presence of a 7 mm nodular opacity in the left upper lobe.  He was started on Rocephin and admitted for further work-up.  Interval History:  Seen in his room this morning with daughter present bedside.  She provides further collateral information.  Notably his diarrhea is new over the past few days.  He does continue to remain on his pain regimen which I stated may be contributing to his weakness and confusion. This morning he still wishes to go home but yesterday daughter states that he was open for rehab if needed. The wound on his right ear is also new per patient and he denied any purulent drainage.  ROS: Constitutional: negative for chills and fevers, Respiratory: negative for cough, Cardiovascular: negative for chest pain, and Gastrointestinal: negative for abdominal pain  Assessment & Plan: * Cellulitis - Right retroauricular cellulitis with no fluid collection appreciated  on CT head - Continue Rocephin for now - Check MRSA swab - Follow-up cultures  Diarrhea - Unclear etiology.  Differential includes functional versus infectious - Per family, has been incontinent of stool at home recently - Obtain stool studies for testing if continues to have diarrhea  COVID-19 - Diagnosed on 06/01/2020 - Previously treated last hospitalization - Continue isolation per protocol  Acute metabolic encephalopathy-resolved as of 06/15/2020 - patient symptoms include AMS - etiology considered due to toxic/metabolic derangements from possible accumulation of home opioid use and dehydration from GI losses -Patient now alert and oriented again - Avoid oversedation as able  PAF (paroxysmal atrial fibrillation) (HCC) - On Coumadin -INR supratherapeutic on admission and was also previously supratherapeutic last hospitalization - No obvious reports of bleeding but received vitamin K on admission - Continue Coumadin per pharmacy.  Okay to bridge with Lovenox given immobility state, recent covid, and history of DVT - discuss with pharmacist about home dose adjustment prior to discharge  Supratherapeutic INR - INR 8.9 on admission; s/p vit K. Now 1.4 this am - home coumadin dose may need adjustment?  Chronic pain - Database reviewed, on fentanyl patch 25 mcg and Percocet 7.5-325 (#120, 30d supply)  Lung nodule - measured at 7 mm on CXR in the LUL - will need CT chest to better evaluate; could be done outpatient  HTN (hypertension) - Continue amlodipine and metoprolol  HLD (hyperlipidemia) - Continue statin  Physical deconditioning - Follow-up PT/OT eval's - Patient undecided about rehab at this time, but family open  to it if needed  Diabetes type 2 with atherosclerosis of arteries of extremities (HCC) - Check A1c - Continue SSI and CBG monitoring  S/P AKA (above knee amputation) bilateral (HCC) - Uses electric wheelchair at home - Amputations were in 2007 due to  PVD, per daughter   Old records reviewed in assessment of this patient  Antimicrobials: Rocephin 06/14/2020 >> current  DVT prophylaxis: Coumadin with Lovenox bridge   Code Status:   Code Status: Full Code Family Communication: Daughter  Disposition Plan: Status is: Inpatient  Remains inpatient appropriate because:IV treatments appropriate due to intensity of illness or inability to take PO and Inpatient level of care appropriate due to severity of illness  Dispo: The patient is from:  home              Anticipated d/c is to:  Pending PT eval              Patient currently is not medically stable to d/c.   Difficult to place patient No      Risk of unplanned readmission score: Unplanned Admission- Pilot do not use: 23.42   Objective: Blood pressure 134/66, pulse 62, temperature 99.1 F (37.3 C), temperature source Oral, resp. rate 20, height 4' (1.219 m), weight 81.8 kg, SpO2 95 %.  Examination: General appearance: alert, cooperative, and no distress Head:  dried blood behind right ear with indurated skin noted, no purulent drainage; very TTP; mild edema Eyes:  EOMI Lungs:  Mild coarse breath sounds bilaterally Heart: regular rate and rhythm and S1, S2 normal Abdomen:  Obese, soft, nontender.  Umbilical hernia appreciated that is reducible with good bowel sounds underneath Extremities:  Bilateral AKA noted Skin:  Scattered skin lesions throughout with dry skin Neurologic: Grossly normal  Consultants:    Procedures:    Data Reviewed: I have personally reviewed following labs and imaging studies Results for orders placed or performed during the hospital encounter of 06/14/20 (from the past 24 hour(s))  Urinalysis, Routine w reflex microscopic     Status: Abnormal   Collection Time: 06/14/20  3:23 PM  Result Value Ref Range   Color, Urine YELLOW YELLOW   APPearance CLEAR CLEAR   Specific Gravity, Urine 1.023 1.005 - 1.030   pH 6.0 5.0 - 8.0   Glucose, UA  NEGATIVE NEGATIVE mg/dL   Hgb urine dipstick NEGATIVE NEGATIVE   Bilirubin Urine NEGATIVE NEGATIVE   Ketones, ur NEGATIVE NEGATIVE mg/dL   Protein, ur 440 (A) NEGATIVE mg/dL   Nitrite NEGATIVE NEGATIVE   Leukocytes,Ua TRACE (A) NEGATIVE   RBC / HPF 0-5 0 - 5 RBC/hpf   WBC, UA 0-5 0 - 5 WBC/hpf   Bacteria, UA NONE SEEN NONE SEEN   Squamous Epithelial / LPF 0-5 0 - 5   Mucus PRESENT   Glucose, capillary     Status: Abnormal   Collection Time: 06/14/20 10:52 PM  Result Value Ref Range   Glucose-Capillary 103 (H) 70 - 99 mg/dL  Protime-INR     Status: Abnormal   Collection Time: 06/15/20  4:27 AM  Result Value Ref Range   Prothrombin Time 17.1 (H) 11.4 - 15.2 seconds   INR 1.4 (H) 0.8 - 1.2  Comprehensive metabolic panel     Status: Abnormal   Collection Time: 06/15/20  4:27 AM  Result Value Ref Range   Sodium 137 135 - 145 mmol/L   Potassium 3.5 3.5 - 5.1 mmol/L   Chloride 96 (L) 98 - 111 mmol/L   CO2  31 22 - 32 mmol/L   Glucose, Bld 109 (H) 70 - 99 mg/dL   BUN 12 8 - 23 mg/dL   Creatinine, Ser 4.09 0.61 - 1.24 mg/dL   Calcium 8.2 (L) 8.9 - 10.3 mg/dL   Total Protein 7.2 6.5 - 8.1 g/dL   Albumin 2.7 (L) 3.5 - 5.0 g/dL   AST 21 15 - 41 U/L   ALT 22 0 - 44 U/L   Alkaline Phosphatase 56 38 - 126 U/L   Total Bilirubin 0.9 0.3 - 1.2 mg/dL   GFR, Estimated >81 >19 mL/min   Anion gap 10 5 - 15  CBC     Status: Abnormal   Collection Time: 06/15/20  4:27 AM  Result Value Ref Range   WBC 19.1 (H) 4.0 - 10.5 K/uL   RBC 4.81 4.22 - 5.81 MIL/uL   Hemoglobin 14.1 13.0 - 17.0 g/dL   HCT 14.7 82.9 - 56.2 %   MCV 95.4 80.0 - 100.0 fL   MCH 29.3 26.0 - 34.0 pg   MCHC 30.7 30.0 - 36.0 g/dL   RDW 13.0 86.5 - 78.4 %   Platelets 471 (H) 150 - 400 K/uL   nRBC 0.0 0.0 - 0.2 %  Glucose, capillary     Status: Abnormal   Collection Time: 06/15/20  7:42 AM  Result Value Ref Range   Glucose-Capillary 107 (H) 70 - 99 mg/dL  Glucose, capillary     Status: Abnormal   Collection Time: 06/15/20  12:35 PM  Result Value Ref Range   Glucose-Capillary 196 (H) 70 - 99 mg/dL    Recent Results (from the past 240 hour(s))  Blood Culture (routine x 2)     Status: None   Collection Time: 06/06/20  7:36 AM   Specimen: BLOOD  Result Value Ref Range Status   Specimen Description   Final    BLOOD LEFT ANTECUBITAL Performed at [facility], [address] [place], [place] [address]    Special Requests   Final    BOTTLES DRAWN AEROBIC AND ANAEROBIC Blood Culture results may not be optimal due to an inadequate volume of blood received in culture bottles Performed at [facility], [address] [place], [place] [address]    Culture   Final    NO GROWTH 5 DAYS Performed at [facility], [address] [place], [place] [address]    Report Status 06/11/2020 FINAL  Final  Blood Culture (routine x 2)     Status: None   Collection Time: 06/06/20  7:36 AM   Specimen: BLOOD  Result Value Ref Range Status   Specimen Description   Final    BLOOD RIGHT ANTECUBITAL Performed at [facility], [address] [place], [place] [address]    Special Requests   Final    BOTTLES DRAWN AEROBIC AND ANAEROBIC Blood Culture results may not be optimal due to an inadequate volume of blood received in culture bottles Performed at [facility], [address], [place], [place] [address]    Culture   Final    NO GROWTH 5 DAYS Performed at [facility], [address] [place], [place] [address]    Report Status 06/11/2020 FINAL  Final  Resp Panel by RT-PCR (Flu A&B, Covid) Nasopharyngeal Swab     Status: Abnormal   Collection Time: 06/06/20  7:45 AM   Specimen: Nasopharyngeal Swab; Nasopharyngeal(NP) swabs in vial transport medium  Result Value Ref  Range Status   SARS Coronavirus 2 by RT PCR POSITIVE (A) NEGATIVE Final    Comment: CRITICAL RESULT CALLED TO, READ BACK BY AND VERIFIED WITH: SOVOIE B. ON  06/06/2020 @  0917 BY MECIAL J (NOTE) SARS-CoV-2 target nucleic acids are DETECTED.  The SARS-CoV-2 RNA is generally detectable in upper respiratory specimens during the acute phase of infection. Positive results are indicative of the presence of the identified virus, but do not rule out bacterial infection or co-infection with other pathogens not detected by the test. Clinical correlation with patient history and other diagnostic information is necessary to determine patient infection status. The expected result is Negative.  Fact Sheet for Patients: BloggerCourse.com  Fact Sheet for Healthcare Providers: SeriousBroker.it  This test is not yet approved or cleared by the [place] FDA and  has been authorized for detection and/or diagnosis of SARS-CoV-2 by FDA under an Emergency Use Authorization (EUA).  This EUA will remain in effect (meani ng this test can be used) for the duration of  the COVID-19 declaration under Section 564(b)(1) of the Act, 21 U.S.C. section 360bbb-3(b)(1), unless the authorization is terminated or revoked sooner.     Influenza A by PCR NEGATIVE NEGATIVE Final   Influenza B by PCR NEGATIVE NEGATIVE Final    Comment: (NOTE) The Xpert Xpress SARS-CoV-2/FLU/RSV plus assay is intended as an aid in the diagnosis of influenza from Nasopharyngeal swab specimens and should not be used as a sole basis for treatment. Nasal washings and aspirates are unacceptable for Xpert Xpress SARS-CoV-2/FLU/RSV testing.  Fact Sheet for Patients: BloggerCourse.com  Fact Sheet for Healthcare Providers: SeriousBroker.it  This test is not yet approved or cleared by the [place] FDA and has been authorized for detection and/or diagnosis of SARS-CoV-2 by FDA under an Emergency Use Authorization (EUA). This EUA will remain in effect (meaning this test can be used) for the duration of  the COVID-19 declaration under Section 564(b)(1) of the Act, 21 U.S.C. section 360bbb-3(b)(1), unless the authorization is terminated or revoked.  Performed at [facility], [address] [place], [place] [address]      Radiology Studies: CT Head Wo Contrast  Result Date: 06/14/2020 CLINICAL DATA:  Mental status change with unknown cause. Retro auricular cellulitis on the right EXAM: CT HEAD WITHOUT CONTRAST TECHNIQUE: Contiguous axial images were obtained from the base of the skull through the vertex without intravenous contrast. COMPARISON:  06/06/2020 FINDINGS: Brain: Generalized atrophy. Remote infarct at the high left frontal parietal junction where there is asymmetric gyral narrowing. Small remote left cerebellar infarction. Vascular: No hyperdense vessel or unexpected calcification. Skull: History of right retroauricular cellulitis correlating with new subcutaneous reticulation. No discrete rounded area to imply abscess. No soft tissue gas. Sinuses/Orbits: Continued sinus inflammation with bilateral maxillary and sphenoid fluid levels, progressed from prior. Bilateral cataract resection. IMPRESSION: 1. No acute intracranial finding. 2. Right retroauricular cellulitis without complicating features. 3. Progressive sinusitis with bilateral fluid level. Electronically Signed   By: Marnee Spring M.D.   On: 06/14/2020 10:27   DG Chest Port 1 View  Result Date: 06/14/2020 CLINICAL DATA:  Cough and weakness EXAM: PORTABLE CHEST 1 VIEW COMPARISON:  June 06, 2020 FINDINGS: There is a 7 mm nodular opacity in the left upper lobe. There is bibasilar atelectasis. There has been interval clearing of infiltrate from the right base. There is mild atelectasis in the left base. The heart is upper normal in size with pulmonary vascularity normal. No adenopathy. There is aortic  atherosclerosis. No bone lesions. IMPRESSION: 1.  Interval clearing of infiltrate from the right base. 2. 7 mm  nodular opacity left upper lobe. Correlation noncontrast enhanced chest CT advised given this nodular opacity. 3.  Atelectasis left base. 4. Stable cardiac silhouette. Aortic Atherosclerosis (ICD10-I70.0). Electronically Signed   By: Bretta Bang III M.D.   On: 06/14/2020 09:27   CT Head Wo Contrast  Final Result    DG Chest Port 1 View  Final Result      Scheduled Meds:  amLODipine  5 mg Oral Daily   donepezil  10 mg Oral QHS   DULoxetine  30 mg Oral Daily   fentaNYL  1 patch Transdermal Q72H   FLUoxetine  40 mg Oral Daily   gabapentin  900 mg Oral TID   guaiFENesin  600 mg Oral BID   insulin aspart  0-15 Units Subcutaneous TID WC   insulin aspart  0-5 Units Subcutaneous QHS   metoprolol tartrate  25 mg Oral BID   mometasone-formoterol  2 puff Inhalation BID   simvastatin  40 mg Oral QPM   tamsulosin  0.4 mg Oral Daily   PRN Meds: acetaminophen, albuterol, hydrALAZINE, ondansetron **OR** ondansetron (ZOFRAN) IV Continuous Infusions:  cefTRIAXone (ROCEPHIN)  IV       LOS: 1 day  Time spent: Greater than 50% of the 35 minute visit was spent in counseling/coordination of care for the patient as laid out in the A&P.   Lewie Chamber, MD [facility] 06/15/2020, 1:03 PM

## 2020-06-15 NOTE — Assessment & Plan Note (Addendum)
-   Uses electric wheelchair at home - Amputations were in 2007 due to PVD, per daughter

## 2020-06-15 NOTE — Assessment & Plan Note (Addendum)
-   Follow-up PT/OT eval's -patient has been weak upon returning home after his recent hospitalization for COVID.  He was brought back to the hospital by his family due to concern for patient being unsafe to remain at home.  He is still against going to any sort of rehab -Reevaluated by PT today and was able to transfer out of bed safely without requiring trapeze lift

## 2020-06-15 NOTE — Assessment & Plan Note (Signed)
-   Diagnosed on 06/01/2020 - Previously treated last hospitalization - Continue isolation per protocol

## 2020-06-15 NOTE — Assessment & Plan Note (Addendum)
-   Unclear etiology.  Differential includes functional versus infectious; however, patient is denying he is having any diarrhea and daughter was endorsing he did on admission - If he still has diarrhea we will test it

## 2020-06-15 NOTE — Progress Notes (Signed)
Patient arrived via stretcher to unit accompanied by  ED RN. Skin check completed. Patient has skin breakdown behind his right ear. Patient also has rash behind left thigh. Patient has blanchable redness to sacrum, Mepilex foam placed on sacrum for skin breakdown prophylaxis.

## 2020-06-15 NOTE — Hospital Course (Signed)
Lamontae Ricardo is a 85 y.o. male with medical history significant of PVD s/p B/L AKA (2007), DVT, a fib (on coumadin), HTN, DM2, recent COVID PNA (06/01/20) w/ hypoxia.  He was brought to the ER by his son after he developed worsening weakness and confusion at home.  There was also reported that his breathing was slowed and congested.  Family was concerned for COVID worsening.  He also developed diarrhea and was incontinent of stool over the past 2 to 3 days. In addition, he also developed a worsening wound on his right ear.  He denied picking at it nor any purulent drainage. CT head was ordered on admission which showed right retroauricular cellulitis with no obvious evidence of underlying abscess or fluid collection.  Progressive sinusitis noted with bilateral fluid levels.  No other acute findings. CXR showed interval clearing of infiltrate from his right base and presence of a 7 mm nodular opacity in the left upper lobe.  He was started on Rocephin and admitted for further work-up.

## 2020-06-15 NOTE — Assessment & Plan Note (Addendum)
-   Check A1c = 6.5% - Continue SSI and CBG monitoring

## 2020-06-15 NOTE — Plan of Care (Signed)
  Problem: Clinical Measurements: Goal: Respiratory complications will improve Outcome: Progressing   Problem: Nutrition: Goal: Adequate nutrition will be maintained Outcome: Progressing   Problem: Coping: Goal: Level of anxiety will decrease Outcome: Progressing   

## 2020-06-16 DIAGNOSIS — R5381 Other malaise: Secondary | ICD-10-CM | POA: Diagnosis not present

## 2020-06-16 DIAGNOSIS — L03211 Cellulitis of face: Secondary | ICD-10-CM | POA: Diagnosis not present

## 2020-06-16 LAB — CBC WITH DIFFERENTIAL/PLATELET
Abs Immature Granulocytes: 0.31 10*3/uL — ABNORMAL HIGH (ref 0.00–0.07)
Basophils Absolute: 0 10*3/uL (ref 0.0–0.1)
Basophils Relative: 0 %
Eosinophils Absolute: 0.2 10*3/uL (ref 0.0–0.5)
Eosinophils Relative: 1 %
HCT: 43.4 % (ref 39.0–52.0)
Hemoglobin: 13.3 g/dL (ref 13.0–17.0)
Immature Granulocytes: 2 %
Lymphocytes Relative: 4 %
Lymphs Abs: 0.7 10*3/uL (ref 0.7–4.0)
MCH: 28.8 pg (ref 26.0–34.0)
MCHC: 30.6 g/dL (ref 30.0–36.0)
MCV: 93.9 fL (ref 80.0–100.0)
Monocytes Absolute: 1.6 10*3/uL — ABNORMAL HIGH (ref 0.1–1.0)
Monocytes Relative: 10 %
Neutro Abs: 13.4 10*3/uL — ABNORMAL HIGH (ref 1.7–7.7)
Neutrophils Relative %: 83 %
Platelets: 505 10*3/uL — ABNORMAL HIGH (ref 150–400)
RBC: 4.62 MIL/uL (ref 4.22–5.81)
RDW: 15.2 % (ref 11.5–15.5)
WBC: 16.2 10*3/uL — ABNORMAL HIGH (ref 4.0–10.5)
nRBC: 0 % (ref 0.0–0.2)

## 2020-06-16 LAB — BASIC METABOLIC PANEL
Anion gap: 10 (ref 5–15)
BUN: 14 mg/dL (ref 8–23)
CO2: 30 mmol/L (ref 22–32)
Calcium: 8.1 mg/dL — ABNORMAL LOW (ref 8.9–10.3)
Chloride: 95 mmol/L — ABNORMAL LOW (ref 98–111)
Creatinine, Ser: 0.65 mg/dL (ref 0.61–1.24)
GFR, Estimated: >60 mL/min (ref >60)
Glucose, Bld: 108 mg/dL — ABNORMAL HIGH (ref 70–99)
Potassium: 3.3 mmol/L — ABNORMAL LOW (ref 3.5–5.1)
Sodium: 135 mmol/L (ref 135–145)

## 2020-06-16 LAB — PROTIME-INR
INR: 1.7 — ABNORMAL HIGH (ref 0.8–1.2)
Prothrombin Time: 20.1 seconds — ABNORMAL HIGH (ref 11.4–15.2)

## 2020-06-16 LAB — GLUCOSE, CAPILLARY
Glucose-Capillary: 123 mg/dL — ABNORMAL HIGH (ref 70–99)
Glucose-Capillary: 155 mg/dL — ABNORMAL HIGH (ref 70–99)
Glucose-Capillary: 110 mg/dL — ABNORMAL HIGH (ref 70–99)
Glucose-Capillary: 145 mg/dL — ABNORMAL HIGH (ref 70–99)

## 2020-06-16 LAB — MAGNESIUM: Magnesium: 2.2 mg/dL (ref 1.7–2.4)

## 2020-06-16 MED ORDER — SULFAMETHOXAZOLE-TRIMETHOPRIM 800-160 MG PO TABS: 1.0000 | ORAL_TABLET | Freq: Two times a day (BID) | ORAL | Status: DC

## 2020-06-16 MED ORDER — WARFARIN SODIUM 2 MG PO TABS: 2.0000 mg | ORAL_TABLET | Freq: Once | ORAL | Status: DC

## 2020-06-16 MED ORDER — DOXYCYCLINE HYCLATE 100 MG PO TABS
100.0000 mg | ORAL_TABLET | Freq: Two times a day (BID) | ORAL | Status: DC
  Administered 2020-06-16 – 2020-06-18 (×4): 100 mg via ORAL
  Filled 2020-06-16 (×5): qty 1

## 2020-06-16 MED ORDER — POTASSIUM CHLORIDE CRYS ER 20 MEQ PO TBCR
40.0000 meq | EXTENDED_RELEASE_TABLET | Freq: Once | ORAL | Status: AC
  Administered 2020-06-16: 40 meq via ORAL
  Filled 2020-06-16: qty 2

## 2020-06-16 MED ORDER — WARFARIN SODIUM 2 MG PO TABS
2.0000 mg | ORAL_TABLET | ORAL | Status: AC
  Administered 2020-06-16: 2 mg via ORAL
  Filled 2020-06-16: qty 1

## 2020-06-16 NOTE — Progress Notes (Signed)
Progress Note    Mark Russell   IDC:[id number]  DOB: 12-Jul-1935  DOA: 06/14/2020     2  PCP: Maurice Small, MD  CC: weakness  Hospital Course: Mark Russell is a 85 y.o. male with medical history significant of PVD s/p B/L AKA (2007), DVT, a fib (on coumadin), HTN, DM2, recent COVID PNA (06/01/20) w/ hypoxia.  He was brought to the ER by his son after he developed worsening weakness and confusion at home.  There was also reported that his breathing was slowed and congested.  Family was concerned for COVID worsening.  He also developed diarrhea and was incontinent of stool over the past 2 to 3 days. In addition, he also developed a worsening wound on his right ear.  He denied picking at it nor any purulent drainage. CT head was ordered on admission which showed right retroauricular cellulitis with no obvious evidence of underlying abscess or fluid collection.  Progressive sinusitis noted with bilateral fluid levels.  No other acute findings. CXR showed interval clearing of infiltrate from his right base and presence of a 7 mm nodular opacity in the left upper lobe.  He was started on Rocephin and admitted for further work-up.  Interval History:  No events overnight.  Patient mildly agitated this morning and did not want to work much with OT either.  He was aggravated frequently when I was trying to check his right ear wound as well. He still does not wish to go anywhere other than home at time of discharge.  ROS: Constitutional: negative for chills and fevers, Respiratory: negative for cough, Cardiovascular: negative for chest pain, and Gastrointestinal: negative for abdominal pain  Assessment & Plan: * Cellulitis - Right retroauricular cellulitis with no fluid collection appreciated on CT head -Patient having difficulty with IV access and was declining new IV overnight - Therefore, discontinue Rocephin and start on doxycycline to complete course - Check MRSA swab - Follow-up  cultures -Wound/lesion also has some appearance of a basal cell.  Would prefer this gentleman have a referral to dermatology outpatient especially given his diffuse skin lesions throughout  Physical deconditioning - Follow-up PT/OT eval's - Patient undecided about rehab at this time, but family open to it if needed; unfortunately patient is still declining rehab with further questioning - patient's daughter, Mark Russell also very concerned about patient coming home and having the strength to transfer adequately out of bed; will reach out to PT to talk with her to clarify what he needs to accomplish to be able to return home  Diarrhea - Unclear etiology.  Differential includes functional versus infectious; however, patient is denying he is having any diarrhea and daughter was endorsing he did on admission - If he still has diarrhea we will test it  COVID-19 - Diagnosed on 06/01/2020 - Previously treated last hospitalization - Continue isolation per protocol  Acute metabolic encephalopathy-resolved as of 06/15/2020 - patient symptoms include AMS - etiology considered due to toxic/metabolic derangements from possible accumulation of home opioid use and dehydration from GI losses -Patient now alert and oriented again - Avoid oversedation as able  Lung nodule - measured at 7 mm on CXR in the LUL - will need CT chest to better evaluate; could be done outpatient  PAF (paroxysmal atrial fibrillation) (HCC) - On Coumadin -INR supratherapeutic on admission and was also previously supratherapeutic last hospitalization - No obvious reports of bleeding but received vitamin K on admission - Continue Coumadin per pharmacy.  Okay to bridge with Lovenox given  immobility state, recent covid, and history of DVT - discuss with pharmacist about home dose adjustment prior to discharge  Supratherapeutic INR - INR 8.9 on admission; s/p vit K - home coumadin dose may need adjustment; see PAF  Chronic pain -  Database reviewed, on fentanyl patch 25 mcg and Percocet 7.5-325 (#120, 30d supply)  HTN (hypertension) - Continue amlodipine and metoprolol  HLD (hyperlipidemia) - Continue statin  Diabetes type 2 with atherosclerosis of arteries of extremities (HCC) - Check A1c - Continue SSI and CBG monitoring  S/P AKA (above knee amputation) bilateral (HCC) - Uses electric wheelchair at home - Amputations were in 2007 due to PVD, per daughter   Old records reviewed in assessment of this patient  Antimicrobials: Rocephin 06/14/2020 >> 06/16/2020 Doxycycline 06/16/2020 >> current  DVT prophylaxis: Coumadin with Lovenox bridge warfarin (COUMADIN) tablet 2 mg   Code Status:   Code Status: Full Code Family Communication: Daughter  Disposition Plan: Status is: Inpatient  Remains inpatient appropriate because:IV treatments appropriate due to intensity of illness or inability to take PO and Inpatient level of care appropriate due to severity of illness  Dispo: The patient is from:  home              Anticipated d/c is to:  home with [facility]              Patient currently is medically stable to d/c.   Difficult to place patient No  Risk of unplanned readmission score: Unplanned Admission- Pilot do not use: 23.96   Objective: Blood pressure (!) 156/68, pulse (!) 56, temperature 98.5 F (36.9 C), temperature source Oral, resp. rate 20, height 4' (1.219 m), weight 81.8 kg, SpO2 93 %.  Examination: General appearance: alert, cooperative, and no distress Head:  dried blood behind right ear with indurated skin noted, no purulent drainage; very TTP; mild edema; some induration tracking along lateral neck from lesion Eyes:  EOMI Lungs:  Mild coarse breath sounds bilaterally Heart: regular rate and rhythm and S1, S2 normal Abdomen:  Obese, soft, nontender.  Umbilical hernia appreciated that is reducible with good bowel sounds underneath Extremities:  Bilateral AKA noted Skin:  Scattered skin lesions  throughout with dry skin Neurologic: Grossly normal  Consultants:    Procedures:    Data Reviewed: I have personally reviewed following labs and imaging studies Results for orders placed or performed during the hospital encounter of 06/14/20 (from the past 24 hour(s))  Glucose, capillary     Status: Abnormal   Collection Time: 06/15/20  4:52 PM  Result Value Ref Range   Glucose-Capillary 139 (H) 70 - 99 mg/dL  Glucose, capillary     Status: Abnormal   Collection Time: 06/15/20  8:52 PM  Result Value Ref Range   Glucose-Capillary 110 (H) 70 - 99 mg/dL  Protime-INR     Status: Abnormal   Collection Time: 06/16/20  4:24 AM  Result Value Ref Range   Prothrombin Time 20.1 (H) 11.4 - 15.2 seconds   INR 1.7 (H) 0.8 - 1.2  Basic metabolic panel     Status: Abnormal   Collection Time: 06/16/20  4:24 AM  Result Value Ref Range   Sodium 135 135 - 145 mmol/L   Potassium 3.3 (L) 3.5 - 5.1 mmol/L   Chloride 95 (L) 98 - 111 mmol/L   CO2 30 22 - 32 mmol/L   Glucose, Bld 108 (H) 70 - 99 mg/dL   BUN 14 8 - 23 mg/dL   Creatinine, Ser  0.65 0.61 - 1.24 mg/dL   Calcium 8.1 (L) 8.9 - 10.3 mg/dL   GFR, Estimated >59 >56 mL/min   Anion gap 10 5 - 15  CBC with Differential/Platelet     Status: Abnormal   Collection Time: 06/16/20  4:24 AM  Result Value Ref Range   WBC 16.2 (H) 4.0 - 10.5 K/uL   RBC 4.62 4.22 - 5.81 MIL/uL   Hemoglobin 13.3 13.0 - 17.0 g/dL   HCT 38.7 56.4 - 33.2 %   MCV 93.9 80.0 - 100.0 fL   MCH 28.8 26.0 - 34.0 pg   MCHC 30.6 30.0 - 36.0 g/dL   RDW 95.1 88.4 - 16.6 %   Platelets 505 (H) 150 - 400 K/uL   nRBC 0.0 0.0 - 0.2 %   Neutrophils Relative % 83 %   Neutro Abs 13.4 (H) 1.7 - 7.7 K/uL   Lymphocytes Relative 4 %   Lymphs Abs 0.7 0.7 - 4.0 K/uL   Monocytes Relative 10 %   Monocytes Absolute 1.6 (H) 0.1 - 1.0 K/uL   Eosinophils Relative 1 %   Eosinophils Absolute 0.2 0.0 - 0.5 K/uL   Basophils Relative 0 %   Basophils Absolute 0.0 0.0 - 0.1 K/uL   Immature  Granulocytes 2 %   Abs Immature Granulocytes 0.31 (H) 0.00 - 0.07 K/uL  Magnesium     Status: None   Collection Time: 06/16/20  4:24 AM  Result Value Ref Range   Magnesium 2.2 1.7 - 2.4 mg/dL  Glucose, capillary     Status: Abnormal   Collection Time: 06/16/20  7:21 AM  Result Value Ref Range   Glucose-Capillary 123 (H) 70 - 99 mg/dL  Glucose, capillary     Status: Abnormal   Collection Time: 06/16/20 11:11 AM  Result Value Ref Range   Glucose-Capillary 155 (H) 70 - 99 mg/dL    No results found for this or any previous visit (from the past 240 hour(s)).    Radiology Studies: No results found. CT Head Wo Contrast  Final Result    DG Chest Port 1 View  Final Result      Scheduled Meds:  amLODipine  5 mg Oral Daily   donepezil  10 mg Oral QHS   doxycycline  100 mg Oral Q12H   DULoxetine  30 mg Oral Daily   enoxaparin (LOVENOX) injection  80 mg Subcutaneous Q12H   fentaNYL  1 patch Transdermal Q72H   FLUoxetine  40 mg Oral Daily   gabapentin  900 mg Oral TID   guaiFENesin  600 mg Oral BID   insulin aspart  0-15 Units Subcutaneous TID WC   insulin aspart  0-5 Units Subcutaneous QHS   metoprolol tartrate  25 mg Oral BID   mometasone-formoterol  2 puff Inhalation BID   simvastatin  40 mg Oral QPM   tamsulosin  0.4 mg Oral Daily   [START ON 06/17/2020] warfarin  2 mg Oral ONCE-1600   Warfarin - Pharmacist Dosing Inpatient   Does not apply q1600   PRN Meds: acetaminophen, albuterol, hydrALAZINE, ondansetron **OR** ondansetron (ZOFRAN) IV Continuous Infusions:     LOS: 2 days  Time spent: Greater than 50% of the 35 minute visit was spent in counseling/coordination of care for the patient as laid out in the A&P.   Lewie Chamber, MD [facility] 06/16/2020, 3:54 PM

## 2020-06-16 NOTE — Evaluation (Signed)
Occupational Therapy Evaluation Patient Details Name: Mark Russell MRN: [id number] DOB: 12/11/1935 Today's Date: 06/16/2020    History of Present Illness Mark Russell is a 85 y.o. male with medical history significant of PVD, DVT on anticoagulation, a fib, HTN, DM2, recent COVID PNA w/ hypoxia, h/o bil. AKA. Presenting with weakness and confusion.He was recently d/c from [facility] on 06/10/20.   Clinical Impression   Pt admitted with above. He demonstrates the below listed deficits and will benefit from continued OT to maximize safety and independence with BADLs. Pt presents to OT with generalized weakness, decreased activity tolerance, increased pain, worsened cognition.  He currently requires min - max A for ADLs, and mod A for bed mobility.  He adamantly refused transfers with me today.  He lives with his son, and was mod I with all ADLs and transfers prior to onset of COVID.  Recommend HHOT as pt is not interested in Post acute rehab.  He uses an overhead trapeze bar at home to assist with bed mobility and transfers and would benefit from one here in hospital.  Will follow acutely.      Follow Up Recommendations  Home health OT;Supervision/Assistance - 24 hour    Equipment Recommendations  None recommended by OT    Recommendations for Other Services       Precautions / Restrictions Precautions Precautions: Fall      Mobility Bed Mobility Overal bed mobility: Needs Assistance Bed Mobility: Supine to Sit;Sit to Supine     Supine to sit: Mod assist Sit to supine: Min assist   General bed mobility comments: with use of bed rails, requires assist to lift trunk    Transfers                 General transfer comment: pt refused attempt at transfers    Balance Overall balance assessment: Needs assistance Sitting-balance support: No upper extremity supported Sitting balance-Leahy Scale: Fair                                     ADL either performed or  assessed with clinical judgement   ADL Overall ADL's : Needs assistance/impaired Eating/Feeding: Set up;Sitting;Bed level   Grooming: Wash/dry hands;Wash/dry face;Oral care;Brushing hair;Minimal assistance;Sitting;Bed level   Upper Body Bathing: Moderate assistance;Sitting   Lower Body Bathing: Maximal assistance;Bed level   Upper Body Dressing : Moderate assistance;Bed level   Lower Body Dressing: Total assistance;Bed level     Toilet Transfer Details (indicate cue type and reason): pt refused Toileting- Clothing Manipulation and Hygiene: Maximal assistance;Bed level       Functional mobility during ADLs: Moderate assistance (bed mobility only as pt refused transfers)       Vision Baseline Vision/History: Wears glasses Patient Visual Report: No change from baseline       Perception     Praxis      Pertinent Vitals/Pain Pain Assessment: Faces Faces Pain Scale: Hurts even more Pain Location: head, buttocks and back.  Pt initially denied pain, but right after said he needed some pain meds Pain Descriptors / Indicators: Grimacing;Guarding;Restless Pain Intervention(s): Monitored during session;Patient requesting pain meds-RN notified     Hand Dominance Right   Extremity/Trunk Assessment Upper Extremity Assessment Upper Extremity Assessment: Generalized weakness   Lower Extremity Assessment Lower Extremity Assessment:  (bil. AKA)   Cervical / Trunk Assessment Cervical / Trunk Assessment: Kyphotic   Communication Communication Communication: No difficulties  Cognition Arousal/Alertness: Awake/alert Behavior During Therapy: WFL for tasks assessed/performed (pleasantly gruff at times, but son reports this is baseline) Overall Cognitive Status: Impaired/Different from baseline Area of Impairment: Attention;Orientation;Memory;Following commands;Awareness;Problem solving                 Orientation Level: Disoriented to;Place;Time;Situation Current  Attention Level: Sustained Memory: Decreased short-term memory Following Commands: Follows one step commands consistently   Awareness: Intellectual Problem Solving: Slow processing;Difficulty sequencing;Requires verbal cues General Comments: Pt thinks it's April, but doesn't know the year.  Son reports cognitive deficit at baseline, but reports it's worse   General Comments  son present throughout session    Exercises     Shoulder Instructions      Home Living Family/patient expects to be discharged to:: Private residence Living Arrangements: Children Available Help at Discharge: Family Type of Home: House Home Access: Ramped entrance     Home Layout: One level     Bathroom Shower/Tub: Chief Strategy Officer: Handicapped height Bathroom Accessibility: Yes How Accessible: Accessible via wheelchair Home Equipment: Tub bench;Wheelchair - power   Additional Comments: uses a overhead trapeze over his bed      Prior Functioning/Environment Level of Independence: Independent with assistive device(s)        Comments: Independenet at wc level.        OT Problem List: Decreased strength;Decreased activity tolerance;Impaired balance (sitting and/or standing);Pain      OT Treatment/Interventions: Self-care/ADL training;Therapeutic exercise;DME and/or AE instruction;Energy conservation;Therapeutic activities;Cognitive remediation/compensation;Patient/family education;Balance training    OT Goals(Current goals can be found in the care plan section) Acute Rehab OT Goals Patient Stated Goal: pt unable to generate goals, but son is hoping pt will regain strength and independence OT Goal Formulation: With patient/family Time For Goal Achievement: 06/30/20 Potential to Achieve Goals: Good ADL Goals Pt Will Perform Grooming: with supervision;with set-up;sitting Pt Will Perform Upper Body Bathing: with supervision;with set-up;sitting Pt Will Perform Lower Body  Bathing: with set-up;with supervision;bed level;sitting/lateral leans Pt Will Perform Upper Body Dressing: with set-up;with supervision;sitting Pt Will Perform Lower Body Dressing: with supervision;sit to/from stand;sitting/lateral leans;bed level Pt Will Transfer to Toilet: with supervision;with transfer board;regular height toilet;bedside commode;anterior/posterior transfer;grab bars Pt Will Perform Toileting - Clothing Manipulation and hygiene: with supervision;sitting/lateral leans Pt/caregiver will Perform Home Exercise Program: Increased strength;Right Upper extremity;Left upper extremity;With theraband;With Supervision;With written HEP provided  OT Frequency: Min 2X/week   Barriers to D/C:            Co-evaluation              AM-PAC OT "6 Clicks" Daily Activity     Outcome Measure Help from another person eating meals?: A Little Help from another person taking care of personal grooming?: A Little Help from another person toileting, which includes using toliet, bedpan, or urinal?: A Lot Help from another person bathing (including washing, rinsing, drying)?: A Lot Help from another person to put on and taking off regular upper body clothing?: A Lot Help from another person to put on and taking off regular lower body clothing?: Total 6 Click Score: 13   End of Session Nurse Communication: Mobility status;Patient requests pain meds  Activity Tolerance: Patient limited by fatigue Patient left: in bed;with call bell/phone within reach;with family/visitor present  OT Visit Diagnosis: Cognitive communication deficit (N62.952)                Time: 8413-2440 OT Time Calculation (min): 25 min Charges:  OT General Charges $OT Visit: 1 Visit  OT Evaluation $OT Eval Moderate Complexity: 1 Mod OT Treatments $Therapeutic Activity: 8-22 mins  Eber Jones., OTR/L Acute Rehabilitation Services Pager [phone] Office [phone]   Jeani Hawking M 06/16/2020, 10:29 AM

## 2020-06-16 NOTE — Progress Notes (Addendum)
ANTICOAGULATION CONSULT NOTE  Pharmacy Consult for warfarin with enoxaparin bridge Indication: atrial fibrillation and history of VTE  Allergies  Allergen Reactions   Ace Inhibitors     hypotension   Dilaudid [Hydromorphone Hcl]     "Mean"   Metformin And Related     GI upset   Morphine And Related Other (See Comments)    "mean"   Pletal [Cilostazol]     SOB    Patient Measurements: Height: 4' (121.9 cm) Weight: 81.8 kg (180 lb 5.4 oz) IBW/kg (Calculated) : 22.4  Vital Signs: Temp: 98.5 F (36.9 C) (06/12 1116) Temp Source: Oral (06/12 1116) BP: 156/68 (06/12 1116) Pulse Rate: 56 (06/12 1116)  Labs: Recent Labs    06/14/20 0915 06/15/20 0427 06/16/20 0424  HGB 14.1 14.1 13.3  HCT 45.0 45.9 43.4  PLT 527* 471* 505*  LABPROT 72.5* 17.1* 20.1*  INR 8.9* 1.4* 1.7*  CREATININE 0.74 0.78 0.65     Estimated Creatinine Clearance: 45.7 mL/min (by C-G formula based on SCr of 0.65 mg/dL).   Medical History: Past Medical History:  Diagnosis Date   Anxiety    Avascular necrosis (HCC)    BPH (benign prostatic hyperplasia)    Depression    Diabetes mellitus    DVT (deep venous thrombosis) (HCC)    GERD (gastroesophageal reflux disease)    History of leg amputation (HCC)    both legs   Hypertension    Neuropathy    Peripheral vascular disease (HCC)    Sleep apnea     Medications: Warfarin PTA for history of atrial fibrillation and DVT  Home dose (confirmed with patients son): Warfarin 2mg  (1/2 tablet) MWF, 4 mg (1 tablet) all other days of the week Last dose: 6/9 PM  INR on admission (6/10) = 8.9 was SUPRAtherapeutic  Significant Events: Vitamin K 1 mg IV given 6/10  Note recent admission from 6/2 - 6/6 with supratherapeutic INR on admission (8.1).   Assessment: Pt is an 46 yoM with PMH significant for afib and DVT for which is takes warfarin chronically. Recently admitted with COVID PNA, returned to the ED with confusion, weakness. INR supratherapeutic  on admission, no signs of bleeding, given a dose of vitamin K in ED 6/10.  Pharmacy consulted to dose warfarin while inpatient, with enoxaparin bridge while INR subtherapeutic given history of VTE.   Today, 06/16/20 INR = 1.7 after resumption of anticoagulation yesterday  CBC: Hgb WNL, Plt elevated SCr 0.65, CrCl ~46 mL/min Diet: Carb modified, 80% of lunch charted Drug interactions: doxycycline    Goal of Therapy:  INR 2-3 Monitor platelets by anticoagulation protocol: Yes   Plan:  Enoxaparin 1 mg/kg subQ q12h while INR <2 Warfarin 2mg  PO x 1 this evening PT/INR daily CBC at least q72h Monitor closely for s/sx of bleeding  Anticipate patient will likely need to discharge on lower warfarin dose than taking PTA with recommended outpatient INR check within 48-72 hours of discharge  , PharmD, BCPS 06/16/2020,3:24 PM

## 2020-06-16 NOTE — Progress Notes (Signed)
No IV found on patient on initial rounding at start of shift. Leads replacement. Pt aox2. Refrained from placing mittens on at this time since patient awake and actively eating and drinking independently. Pt easily redirected at this time.

## 2020-06-17 DIAGNOSIS — L989 Disorder of the skin and subcutaneous tissue, unspecified: Secondary | ICD-10-CM | POA: Diagnosis not present

## 2020-06-17 DIAGNOSIS — L03811 Cellulitis of head [any part, except face]: Secondary | ICD-10-CM

## 2020-06-17 LAB — CBC WITH DIFFERENTIAL/PLATELET
Abs Immature Granulocytes: 0.22 10*3/uL — ABNORMAL HIGH (ref 0.00–0.07)
Basophils Absolute: 0 10*3/uL (ref 0.0–0.1)
Basophils Relative: 0 %
Eosinophils Absolute: 0.2 10*3/uL (ref 0.0–0.5)
Eosinophils Relative: 1 %
HCT: 41.8 % (ref 39.0–52.0)
Hemoglobin: 12.9 g/dL — ABNORMAL LOW (ref 13.0–17.0)
Immature Granulocytes: 2 %
Lymphocytes Relative: 4 %
Lymphs Abs: 0.6 10*3/uL — ABNORMAL LOW (ref 0.7–4.0)
MCH: 29.4 pg (ref 26.0–34.0)
MCHC: 30.9 g/dL (ref 30.0–36.0)
MCV: 95.2 fL (ref 80.0–100.0)
Monocytes Absolute: 1.2 10*3/uL — ABNORMAL HIGH (ref 0.1–1.0)
Monocytes Relative: 8 %
Neutro Abs: 12 10*3/uL — ABNORMAL HIGH (ref 1.7–7.7)
Neutrophils Relative %: 85 %
Platelets: 477 10*3/uL — ABNORMAL HIGH (ref 150–400)
RBC: 4.39 MIL/uL (ref 4.22–5.81)
RDW: 15 % (ref 11.5–15.5)
WBC: 14.1 10*3/uL — ABNORMAL HIGH (ref 4.0–10.5)
nRBC: 0 % (ref 0.0–0.2)

## 2020-06-17 LAB — BASIC METABOLIC PANEL
Anion gap: 7 (ref 5–15)
BUN: 12 mg/dL (ref 8–23)
CO2: 32 mmol/L (ref 22–32)
Calcium: 8 mg/dL — ABNORMAL LOW (ref 8.9–10.3)
Chloride: 95 mmol/L — ABNORMAL LOW (ref 98–111)
Creatinine, Ser: 0.69 mg/dL (ref 0.61–1.24)
GFR, Estimated: >60 mL/min (ref >60)
Glucose, Bld: 159 mg/dL — ABNORMAL HIGH (ref 70–99)
Potassium: 4.3 mmol/L (ref 3.5–5.1)
Sodium: 134 mmol/L — ABNORMAL LOW (ref 135–145)

## 2020-06-17 LAB — GLUCOSE, CAPILLARY
Glucose-Capillary: 136 mg/dL — ABNORMAL HIGH (ref 70–99)
Glucose-Capillary: 122 mg/dL — ABNORMAL HIGH (ref 70–99)
Glucose-Capillary: 146 mg/dL — ABNORMAL HIGH (ref 70–99)
Glucose-Capillary: 101 mg/dL — ABNORMAL HIGH (ref 70–99)

## 2020-06-17 LAB — PROTIME-INR
INR: 2.8 — ABNORMAL HIGH (ref 0.8–1.2)
Prothrombin Time: 29.4 seconds — ABNORMAL HIGH (ref 11.4–15.2)

## 2020-06-17 LAB — HEMOGLOBIN A1C
Hgb A1c MFr Bld: 6.5 % — ABNORMAL HIGH (ref 4.8–5.6)
Hgb A1c MFr Bld: 6.5 % — ABNORMAL HIGH (ref 4.8–5.6)
Mean Plasma Glucose: 140 mg/dL
Mean Plasma Glucose: 140 mg/dL

## 2020-06-17 LAB — MAGNESIUM: Magnesium: 2 mg/dL (ref 1.7–2.4)

## 2020-06-17 MED ORDER — LIP MEDEX EX OINT
TOPICAL_OINTMENT | CUTANEOUS | Status: DC | PRN
  Administered 2020-06-17: 1 via TOPICAL
  Filled 2020-06-17: qty 7

## 2020-06-17 NOTE — Consult Note (Signed)
[facility] CM Inpatient Consult   06/17/2020  Mark Russell [birth year] [id number]  Patient chart has been reviewed for readmissions less than 30 days and for high risk score for unplanned readmissions.  Patient assessed for community [facility] Care Management follow up needs.    Plan: Continue to follow for progression and disposition plans.  Of note, [facility] Care Management services does not replace or interfere with any services that are arranged by inpatient case management or social work.   Christophe Louis, MSN, RN [facility] Liaison Nurse Mobile Phone [phone]  Toll free office [phone]

## 2020-06-17 NOTE — Consult Note (Addendum)
WOC Nurse Consult Note: Patient receiving care in [facility] Reason for Consult: Wound behind right ear and possible right posterior shoulder cellulitis. Wound type: The right retroauricular area has a wound that appears to be some type of skin cancer.  For this area, it needs to be biopsied.  As for the possible right shoulder cellulitis, the WOC nurse does not prescribe treatment for cellulitis.  It exceeds our scope of practice.  This information was sent via SecureChat to Dr. Roanna Epley and primary RN Agustina Caroli. Pressure Injury POA: Yes/No/NA Measurement: Wound bed: Drainage (amount, consistency, odor)  Periwound: Dressing procedure/placement/frequency:  The patient was not seen and will not be followed. WOC nurse will not follow at this time.  Please re-consult the WOC team if needed.  Helmut Muster, RN, MSN, CWOCN, CNS-BC, pager [phone]

## 2020-06-17 NOTE — Progress Notes (Signed)
Progress Note    Jung Yurchak   JOA:[id number]  DOB: Apr 29, 1935  DOA: 06/14/2020     3  PCP: Maurice Small, MD  CC: weakness  Hospital Course: Gene Glazebrook is a 85 y.o. male with medical history significant of PVD s/p B/L AKA (2007), DVT, a fib (on coumadin), HTN, DM2, recent COVID PNA (06/01/20) w/ hypoxia.  He was brought to the ER by his son after he developed worsening weakness and confusion at home.  There was also reported that his breathing was slowed and congested.  Family was concerned for COVID worsening.  He also developed diarrhea and was incontinent of stool over the past 2 to 3 days. In addition, he also developed a worsening wound on his right ear.  He denied picking at it nor any purulent drainage. CT head was ordered on admission which showed right retroauricular cellulitis with no obvious evidence of underlying abscess or fluid collection.  Progressive sinusitis noted with bilateral fluid levels.  No other acute findings. CXR showed interval clearing of infiltrate from his right base and presence of a 7 mm nodular opacity in the left upper lobe.  He was started on Rocephin and admitted for further work-up.  Interval History:  No events overnight.  Son present bedside this morning.  Some concern for still taking patient home.  PT was able to come by the room after this conversation and patient actually did relatively well with being able to laterally scoot from the bed to the recliner.  ROS: Constitutional: negative for chills and fevers, Respiratory: negative for cough, Cardiovascular: negative for chest pain, and Gastrointestinal: negative for abdominal pain  Assessment & Plan: * Face lesion - Right retroauricular cellulitis with no fluid collection appreciated on CT head - changed rocephin to doxy to complete an empiric course - wound now looks more purulent/milky drainage and he continues to pull dressing off. Appearance has some basal cell features also. I  will ask for ENT input and to see if removal and/or biopsy is an option while here; greatly appreciate assistance   Physical deconditioning - Follow-up PT/OT eval's -patient has been weak upon returning home after his recent hospitalization for COVID.  He was brought back to the hospital by his family due to concern for patient being unsafe to remain at home.  He is still against going to any sort of rehab -Reevaluated by PT today and was able to transfer out of bed safely without requiring trapeze lift  COVID-19 - Diagnosed on 06/01/2020 - Previously treated last hospitalization - Continue isolation per protocol  Diarrhea-resolved as of 06/17/2020 - Unclear etiology.  Differential includes functional versus infectious; however, patient is denying he is having any diarrhea and daughter was endorsing he did on admission - If he still has diarrhea we will test it  Acute metabolic encephalopathy-resolved as of 06/15/2020 - patient symptoms include AMS - etiology considered due to toxic/metabolic derangements from possible accumulation of home opioid use and dehydration from GI losses -Patient now alert and oriented again - Avoid oversedation as able  Lung nodule - measured at 7 mm on CXR in the LUL - will need CT chest to better evaluate; could be done outpatient  PAF (paroxysmal atrial fibrillation) (HCC) - On Coumadin -INR supratherapeutic on admission and was also previously supratherapeutic last hospitalization - No obvious reports of bleeding but received vitamin K on admission - Continue Coumadin per pharmacy.  Okay to bridge with Lovenox given immobility state, recent covid, and history of DVT -  discuss with pharmacist about home dose adjustment prior to discharge  Supratherapeutic INR - INR 8.9 on admission; s/p vit K - home coumadin dose may need adjustment; see PAF  Chronic pain - Database reviewed, on fentanyl patch 25 mcg and Percocet 7.5-325 (#120, 30d supply)  HTN  (hypertension) - Continue amlodipine and metoprolol  HLD (hyperlipidemia) - Continue statin  Diabetes type 2 with atherosclerosis of arteries of extremities (HCC) - Check A1c = 6.5% - Continue SSI and CBG monitoring  S/P AKA (above knee amputation) bilateral (HCC) - Uses electric wheelchair at home - Amputations were in 2007 due to PVD, per daughter   Old records reviewed in assessment of this patient  Antimicrobials: Rocephin 06/14/2020 >> 06/16/2020 Doxycycline 06/16/2020 >> current  DVT prophylaxis: Coumadin with Lovenox bridge   Code Status:   Code Status: Full Code Family Communication: Daughter  Disposition Plan: Status is: Inpatient  Remains inpatient appropriate because:IV treatments appropriate due to intensity of illness or inability to take PO and Inpatient level of care appropriate due to severity of illness  Dispo: The patient is from:  home              Anticipated d/c is to:  [facility]              Patient currently is medically stable to d/c.   Difficult to place patient No  Risk of unplanned readmission score: Unplanned Admission- Pilot do not use: 26.34   Objective: Blood pressure (!) 151/53, pulse (!) 55, temperature 97.6 F (36.4 C), temperature source Oral, resp. rate 16, height 4' (1.219 m), weight 81.8 kg, SpO2 93 %.  Examination: General appearance: alert, cooperative, and no distress Head:  dried blood behind right ear with indurated skin noted, no purulent drainage; very TTP; mild edema; some induration tracking along lateral neck from lesion Eyes:  EOMI Lungs:  Mild coarse breath sounds bilaterally Heart: regular rate and rhythm and S1, S2 normal Abdomen:  Obese, soft, nontender.  Umbilical hernia appreciated that is reducible with good bowel sounds underneath Extremities:  Bilateral AKA noted Skin:  Scattered skin lesions throughout with dry skin Neurologic: Grossly normal  Consultants:  ENT  Procedures:    Data Reviewed: I have  personally reviewed following labs and imaging studies Results for orders placed or performed during the hospital encounter of 06/14/20 (from the past 24 hour(s))  Glucose, capillary     Status: Abnormal   Collection Time: 06/16/20  4:08 PM  Result Value Ref Range   Glucose-Capillary 110 (H) 70 - 99 mg/dL  Glucose, capillary     Status: Abnormal   Collection Time: 06/16/20  8:30 PM  Result Value Ref Range   Glucose-Capillary 145 (H) 70 - 99 mg/dL  Protime-INR     Status: Abnormal   Collection Time: 06/17/20  7:05 AM  Result Value Ref Range   Prothrombin Time 29.4 (H) 11.4 - 15.2 seconds   INR 2.8 (H) 0.8 - 1.2  Basic metabolic panel     Status: Abnormal   Collection Time: 06/17/20  7:05 AM  Result Value Ref Range   Sodium 134 (L) 135 - 145 mmol/L   Potassium 4.3 3.5 - 5.1 mmol/L   Chloride 95 (L) 98 - 111 mmol/L   CO2 32 22 - 32 mmol/L   Glucose, Bld 159 (H) 70 - 99 mg/dL   BUN 12 8 - 23 mg/dL   Creatinine, Ser 7.34 0.61 - 1.24 mg/dL   Calcium 8.0 (L) 8.9 -  10.3 mg/dL   GFR, Estimated >10 >25 mL/min   Anion gap 7 5 - 15  CBC with Differential/Platelet     Status: Abnormal   Collection Time: 06/17/20  7:05 AM  Result Value Ref Range   WBC 14.1 (H) 4.0 - 10.5 K/uL   RBC 4.39 4.22 - 5.81 MIL/uL   Hemoglobin 12.9 (L) 13.0 - 17.0 g/dL   HCT 85.2 77.8 - 24.2 %   MCV 95.2 80.0 - 100.0 fL   MCH 29.4 26.0 - 34.0 pg   MCHC 30.9 30.0 - 36.0 g/dL   RDW 35.3 61.4 - 43.1 %   Platelets 477 (H) 150 - 400 K/uL   nRBC 0.0 0.0 - 0.2 %   Neutrophils Relative % 85 %   Neutro Abs 12.0 (H) 1.7 - 7.7 K/uL   Lymphocytes Relative 4 %   Lymphs Abs 0.6 (L) 0.7 - 4.0 K/uL   Monocytes Relative 8 %   Monocytes Absolute 1.2 (H) 0.1 - 1.0 K/uL   Eosinophils Relative 1 %   Eosinophils Absolute 0.2 0.0 - 0.5 K/uL   Basophils Relative 0 %   Basophils Absolute 0.0 0.0 - 0.1 K/uL   Immature Granulocytes 2 %   Abs Immature Granulocytes 0.22 (H) 0.00 - 0.07 K/uL  Magnesium     Status: None   Collection  Time: 06/17/20  7:05 AM  Result Value Ref Range   Magnesium 2.0 1.7 - 2.4 mg/dL  Glucose, capillary     Status: Abnormal   Collection Time: 06/17/20  7:27 AM  Result Value Ref Range   Glucose-Capillary 136 (H) 70 - 99 mg/dL  Glucose, capillary     Status: Abnormal   Collection Time: 06/17/20 11:11 AM  Result Value Ref Range   Glucose-Capillary 122 (H) 70 - 99 mg/dL    No results found for this or any previous visit (from the past 240 hour(s)).    Radiology Studies: No results found. CT Head Wo Contrast  Final Result    DG Chest Port 1 View  Final Result      Scheduled Meds:  amLODipine  5 mg Oral Daily   donepezil  10 mg Oral QHS   doxycycline  100 mg Oral Q12H   DULoxetine  30 mg Oral Daily   fentaNYL  1 patch Transdermal Q72H   FLUoxetine  40 mg Oral Daily   gabapentin  900 mg Oral TID   guaiFENesin  600 mg Oral BID   insulin aspart  0-15 Units Subcutaneous TID WC   insulin aspart  0-5 Units Subcutaneous QHS   metoprolol tartrate  25 mg Oral BID   mometasone-formoterol  2 puff Inhalation BID   simvastatin  40 mg Oral QPM   tamsulosin  0.4 mg Oral Daily   Warfarin - Pharmacist Dosing Inpatient   Does not apply q1600   PRN Meds: acetaminophen, albuterol, hydrALAZINE, lip balm, ondansetron **OR** ondansetron (ZOFRAN) IV Continuous Infusions:     LOS: 3 days  Time spent: Greater than 50% of the 35 minute visit was spent in counseling/coordination of care for the patient as laid out in the A&P.   Lewie Chamber, MD [facility] 06/17/2020, 3:44 PM

## 2020-06-17 NOTE — Progress Notes (Signed)
Physical Therapy Treatment Patient Details Name: Mark Russell MRN: [id number] DOB: 1935/07/15 Today's Date: 06/17/2020    History of Present Illness Mark Russell is a 85 y.o. male  Presenting with weakness and confusion..  Dx of cellulitis posterior to R ear. Pt with medical history significant of PVD, DVT on anticoagulation, a fib, HTN, DM2, recent COVID PNA w/ hypoxia, h/o bil. AKA. He was recently [facility] for covid admission    PT Comments    Pt initially refused to attempt transfer to recliner. With encouragement, and benefits of mobility explained, he agreed to get up. No physical assist was needed for lateral scoot with heavy use of BUEs from bed to recliner with arm dropped. Pt appears to be near baseline with mobility.    Follow Up Recommendations  Supervision for mobility/OOB;No PT follow up     Equipment Recommendations  None recommended by PT    Recommendations for Other Services       Precautions / Restrictions Precautions Precautions: Fall Restrictions Weight Bearing Restrictions: No    Mobility  Bed Mobility Overal bed mobility: Modified Independent Bed Mobility: Supine to Sit     Supine to sit: Modified independent (Device/Increase time);HOB elevated     General bed mobility comments: with use of bed rails, no physical assist required, HOB up ~45*    Transfers Overall transfer level: Needs assistance Equipment used: None Transfers: Lateral/Scoot Transfers          Lateral/Scoot Transfers: Supervision General transfer comment: arm on recliner dropped, recliner flush with bed, pt used BUEs to unweight hips and scoot from bed to recliner  Ambulation/Gait             General Gait Details: nonambulatory   Stairs             Wheelchair Mobility    Modified Rankin (Stroke Patients Only)       Balance Overall balance assessment: Needs assistance Sitting-balance support: No upper extremity supported Sitting  balance-Leahy Scale: Fair                                      Cognition Arousal/Alertness: Awake/alert Behavior During Therapy: WFL for tasks assessed/performed Overall Cognitive Status: Within Functional Limits for tasks assessed                                        Exercises      General Comments        Pertinent Vitals/Pain Pain Assessment: No/denies pain    Home Living                      Prior Function            PT Goals (current goals can now be found in the care plan section) Acute Rehab PT Goals Patient Stated Goal: pt wants to go home to his dog PT Goal Formulation: With patient Time For Goal Achievement: 06/29/20 Potential to Achieve Goals: Good Progress towards PT goals: Progressing toward goals    Frequency    Min 3X/week      PT Plan Current plan remains appropriate    Co-evaluation              AM-PAC PT "6 Clicks" Mobility   Outcome Measure  Help needed turning  from your back to your side while in a flat bed without using bedrails?: A Little Help needed moving from lying on your back to sitting on the side of a flat bed without using bedrails?: A Little Help needed moving to and from a bed to a chair (including a wheelchair)?: A Little Help needed standing up from a chair using your arms (e.g., wheelchair or bedside chair)?: Total Help needed to walk in hospital room?: Total Help needed climbing 3-5 steps with a railing? : Total 6 Click Score: 12    End of Session   Activity Tolerance: Patient tolerated treatment well Patient left: in chair;with call bell/phone within reach Nurse Communication: Mobility status PT Visit Diagnosis: Other abnormalities of gait and mobility (R26.89);Muscle weakness (generalized) (M62.81)     Time: 6160-7371 PT Time Calculation (min) (ACUTE ONLY): 16 min  Charges:  $Therapeutic Activity: 8-22 mins                     Ralene Bathe Kistler PT  06/17/2020  Acute Rehabilitation Services Pager [phone] Office [phone]

## 2020-06-17 NOTE — Consult Note (Signed)
Reason for Consult: Postauricular ulcer Referring Physician: Lewie Chamber, MD  Mark Russell is an 85 y.o. male.  HPI: Complicated medical history including bilateral amputee, diabetes, hypertension, vascular disease, with a reported 2-day history of a postauricular lesion of the right side of the scalp.  It is painful and draining.  CT of the head does not reveal any involvement of the mastoid process or the mastoid sinus but apparently just soft tissue inflammation.  As far as the patient knows he did not have anything there prior to a few days ago.  Past Medical History:  Diagnosis Date   Anxiety    Avascular necrosis (HCC)    BPH (benign prostatic hyperplasia)    Depression    Diabetes mellitus    DVT (deep venous thrombosis) (HCC)    GERD (gastroesophageal reflux disease)    History of leg amputation (HCC)    both legs   Hypertension    Neuropathy    Peripheral vascular disease (HCC)    Sleep apnea     Past Surgical History:  Procedure Laterality Date   ABDOMINAL AORTIC ANEURYSM REPAIR  1988   BACK SURGERY  1988, 1989   BALLOON DILATION N/A 04/27/2012   Procedure: BALLOON DILATION;  Surgeon: Shirley Friar, MD;  Location: [facility];  Service: Endoscopy;  Laterality: N/A;  no xray   BYPASS GRAFT     ESOPHAGOGASTRODUODENOSCOPY N/A 04/27/2012   Procedure: ESOPHAGOGASTRODUODENOSCOPY (EGD);  Surgeon: Shirley Friar, MD;  Location: [facility];  Service: Endoscopy;  Laterality: N/A;   LEG AMPUTATION THROUGH KNEE  06/2004    Family History  Problem Relation Age of Onset   Heart attack Other    Cancer Other    Cancer Other    Heart attack Other    Esophageal cancer Daughter        Living, 69    Social History:  reports that he quit smoking about 28 years ago. His smoking use included cigarettes. He has a 7.50 pack-year smoking history. He has never used smokeless tobacco. He reports that he does not drink alcohol. No history on file for drug use.  Allergies:   Allergies  Allergen Reactions   Ace Inhibitors     hypotension   Dilaudid [Hydromorphone Hcl]     "Mean"   Metformin And Related     GI upset   Morphine And Related Other (See Comments)    "mean"   Pletal [Cilostazol]     SOB    Medications: Reviewed  Results for orders placed or performed during the hospital encounter of 06/14/20 (from the past 48 hour(s))  Glucose, capillary     Status: Abnormal   Collection Time: 06/15/20  4:52 PM  Result Value Ref Range   Glucose-Capillary 139 (H) 70 - 99 mg/dL    Comment: Glucose reference range applies only to samples taken after fasting for at least 8 hours.  Glucose, capillary     Status: Abnormal   Collection Time: 06/15/20  8:52 PM  Result Value Ref Range   Glucose-Capillary 110 (H) 70 - 99 mg/dL    Comment: Glucose reference range applies only to samples taken after fasting for at least 8 hours.  Protime-INR     Status: Abnormal   Collection Time: 06/16/20  4:24 AM  Result Value Ref Range   Prothrombin Time 20.1 (H) 11.4 - 15.2 seconds   INR 1.7 (H) 0.8 - 1.2    Comment: (NOTE) INR goal varies based on device and disease states.  Performed at [facility], [address], [place], [place] [address]   Basic metabolic panel     Status: Abnormal   Collection Time: 06/16/20  4:24 AM  Result Value Ref Range   Sodium 135 135 - 145 mmol/L   Potassium 3.3 (L) 3.5 - 5.1 mmol/L   Chloride 95 (L) 98 - 111 mmol/L   CO2 30 22 - 32 mmol/L   Glucose, Bld 108 (H) 70 - 99 mg/dL    Comment: Glucose reference range applies only to samples taken after fasting for at least 8 hours.   BUN 14 8 - 23 mg/dL   Creatinine, Ser 0.10 0.61 - 1.24 mg/dL   Calcium 8.1 (L) 8.9 - 10.3 mg/dL   GFR, Estimated >27 >25 mL/min    Comment: (NOTE) Calculated using the CKD-EPI Creatinine Equation (2021)    Anion gap 10 5 - 15    Comment: Performed at [facility], [address], [place], [place] [address]  CBC with  Differential/Platelet     Status: Abnormal   Collection Time: 06/16/20  4:24 AM  Result Value Ref Range   WBC 16.2 (H) 4.0 - 10.5 K/uL   RBC 4.62 4.22 - 5.81 MIL/uL   Hemoglobin 13.3 13.0 - 17.0 g/dL   HCT 03.4 74.2 - 59.5 %   MCV 93.9 80.0 - 100.0 fL   MCH 28.8 26.0 - 34.0 pg   MCHC 30.6 30.0 - 36.0 g/dL   RDW 63.8 75.6 - 43.3 %   Platelets 505 (H) 150 - 400 K/uL   nRBC 0.0 0.0 - 0.2 %   Neutrophils Relative % 83 %   Neutro Abs 13.4 (H) 1.7 - 7.7 K/uL   Lymphocytes Relative 4 %   Lymphs Abs 0.7 0.7 - 4.0 K/uL   Monocytes Relative 10 %   Monocytes Absolute 1.6 (H) 0.1 - 1.0 K/uL   Eosinophils Relative 1 %   Eosinophils Absolute 0.2 0.0 - 0.5 K/uL   Basophils Relative 0 %   Basophils Absolute 0.0 0.0 - 0.1 K/uL   Immature Granulocytes 2 %   Abs Immature Granulocytes 0.31 (H) 0.00 - 0.07 K/uL    Comment: Performed at [facility], [address], [place], [place] [address]  Magnesium     Status: None   Collection Time: 06/16/20  4:24 AM  Result Value Ref Range   Magnesium 2.2 1.7 - 2.4 mg/dL    Comment: Performed at [facility], [address], [place], [place] [address]  Glucose, capillary     Status: Abnormal   Collection Time: 06/16/20  7:21 AM  Result Value Ref Range   Glucose-Capillary 123 (H) 70 - 99 mg/dL    Comment: Glucose reference range applies only to samples taken after fasting for at least 8 hours.  Glucose, capillary     Status: Abnormal   Collection Time: 06/16/20 11:11 AM  Result Value Ref Range   Glucose-Capillary 155 (H) 70 - 99 mg/dL    Comment: Glucose reference range applies only to samples taken after fasting for at least 8 hours.  Glucose, capillary     Status: Abnormal   Collection Time: 06/16/20  4:08 PM  Result Value Ref Range   Glucose-Capillary 110 (H) 70 - 99 mg/dL    Comment: Glucose reference range applies only to samples taken after fasting for at least 8 hours.  Glucose, capillary     Status: Abnormal    Collection Time: 06/16/20  8:30 PM  Result Value Ref Range   Glucose-Capillary 145 (H) 70 - 99 mg/dL    Comment: Glucose reference range applies only to samples taken after fasting for at least 8 hours.  Protime-INR     Status: Abnormal   Collection Time: 06/17/20  7:05 AM  Result Value Ref Range   Prothrombin Time 29.4 (H) 11.4 - 15.2 seconds   INR 2.8 (H) 0.8 - 1.2    Comment: (NOTE) INR goal varies based on device and disease states. Performed at [facility], [address] [place], [place] [address]   Basic metabolic panel     Status: Abnormal   Collection Time: 06/17/20  7:05 AM  Result Value Ref Range   Sodium 134 (L) 135 - 145 mmol/L   Potassium 4.3 3.5 - 5.1 mmol/L    Comment: DELTA CHECK NOTED   Chloride 95 (L) 98 - 111 mmol/L   CO2 32 22 - 32 mmol/L   Glucose, Bld 159 (H) 70 - 99 mg/dL    Comment: Glucose reference range applies only to samples taken after fasting for at least 8 hours.   BUN 12 8 - 23 mg/dL   Creatinine, Ser 2.44 0.61 - 1.24 mg/dL   Calcium 8.0 (L) 8.9 - 10.3 mg/dL   GFR, Estimated >01 >02 mL/min    Comment: (NOTE) Calculated using the CKD-EPI Creatinine Equation (2021)    Anion gap 7 5 - 15    Comment: Performed at [facility], [address], [place], [place] [address]  CBC with Differential/Platelet     Status: Abnormal   Collection Time: 06/17/20  7:05 AM  Result Value Ref Range   WBC 14.1 (H) 4.0 - 10.5 K/uL   RBC 4.39 4.22 - 5.81 MIL/uL   Hemoglobin 12.9 (L) 13.0 - 17.0 g/dL   HCT 64.4 03.4 - 74.2 %   MCV 95.2 80.0 - 100.0 fL   MCH 29.4 26.0 - 34.0 pg   MCHC 30.9 30.0 - 36.0 g/dL   RDW 59.5 63.8 - 75.6 %   Platelets 477 (H) 150 - 400 K/uL   nRBC 0.0 0.0 - 0.2 %   Neutrophils Relative % 85 %   Neutro Abs 12.0 (H) 1.7 - 7.7 K/uL   Lymphocytes Relative 4 %   Lymphs Abs 0.6 (L) 0.7 - 4.0 K/uL   Monocytes Relative 8 %   Monocytes Absolute 1.2 (H) 0.1 - 1.0 K/uL   Eosinophils Relative 1 %    Eosinophils Absolute 0.2 0.0 - 0.5 K/uL   Basophils Relative 0 %   Basophils Absolute 0.0 0.0 - 0.1 K/uL   Immature Granulocytes 2 %   Abs Immature Granulocytes 0.22 (H) 0.00 - 0.07 K/uL    Comment: Performed at [facility], [address], [place], [place] [address]  Magnesium     Status: None   Collection Time: 06/17/20  7:05 AM  Result Value Ref Range   Magnesium 2.0 1.7 - 2.4 mg/dL    Comment: Performed at [facility], [address], [place], [place] [address]  Glucose, capillary     Status: Abnormal   Collection Time: 06/17/20  7:27 AM  Result Value Ref Range   Glucose-Capillary 136 (H) 70 - 99 mg/dL    Comment: Glucose reference range applies only to samples taken after fasting for at least 8 hours.  Glucose, capillary     Status: Abnormal   Collection Time: 06/17/20 11:11 AM  Result  Value Ref Range   Glucose-Capillary 122 (H) 70 - 99 mg/dL    Comment: Glucose reference range applies only to samples taken after fasting for at least 8 hours.  Glucose, capillary     Status: Abnormal   Collection Time: 06/17/20  4:13 PM  Result Value Ref Range   Glucose-Capillary 146 (H) 70 - 99 mg/dL    Comment: Glucose reference range applies only to samples taken after fasting for at least 8 hours.    No results found.  EEF:EOFHQRFX except as listed in admit H&P  Blood pressure (!) 151/53, pulse (!) 55, temperature 97.6 F (36.4 C), temperature source Oral, resp. rate 16, height 4' (1.219 m), weight 81.8 kg, SpO2 93 %.  PHYSICAL EXAM: Overall appearance: Chronically ill-appearing gentleman, in no distress Head: Normal except for the right postauricular scalp area and upper neck area with a purulent open wound.  He is very tender. Ears: External auditory canals are clear; tympanic membranes are intact in the middle ears are free of any effusion. Nose: External nose is healthy in appearance. Internal nasal exam free of any lesions or  obstruction. Oral Cavity/Pharynx:  There are no mucosal lesions or masses identified. Larynx/Hypopharynx: Deferred Neuro:  No identifiable neurologic deficits. Neck: No additional palpable neck masses.  Studies Reviewed: Head CT  Procedures: none   Assessment/Plan: Infectious process involving the postauricular scalp and upper neck area.  Mildly elevated white blood cell count.  Started on oral doxycycline yesterday.  Recommend swabbing the area for culture and sensitivity testing.  I would consider transitioning to intravenous antibiotics such as clindamycin until the culture results are available.  I will continue to follow.  Serena Colonel 06/17/2020, 4:39 PM   ICD10 - J88.325

## 2020-06-17 NOTE — Progress Notes (Signed)
Pt agitated and refused all bedtime medication. York Spaniel he is tired and wants to be left to sleep. Several attempts and kept refusing getting more agitated. Meds marked as refused. On call MD informed of refusal via secure chat

## 2020-06-17 NOTE — Progress Notes (Signed)
ANTICOAGULATION CONSULT NOTE  Pharmacy Consult for warfarin with enoxaparin bridge Indication: atrial fibrillation and history of VTE  Allergies  Allergen Reactions   Ace Inhibitors     hypotension   Dilaudid [Hydromorphone Hcl]     "Mean"   Metformin And Related     GI upset   Morphine And Related Other (See Comments)    "mean"   Pletal [Cilostazol]     SOB    Patient Measurements: Height: 4' (121.9 cm) Weight: 81.8 kg (180 lb 5.4 oz) IBW/kg (Calculated) : 22.4  Vital Signs: Temp: 98.8 F (37.1 C) (06/13 0404) Temp Source: Oral (06/13 0404) BP: 141/67 (06/13 0404) Pulse Rate: 69 (06/13 0404)  Labs: Recent Labs    06/15/20 0427 06/16/20 0424 06/17/20 0705  HGB 14.1 13.3 12.9*  HCT 45.9 43.4 41.8  PLT 471* 505* 477*  LABPROT 17.1* 20.1* 29.4*  INR 1.4* 1.7* 2.8*  CREATININE 0.78 0.65 0.69     Estimated Creatinine Clearance: 45.7 mL/min (by C-G formula based on SCr of 0.69 mg/dL).  PTA Warfarin:  Home dose confirmed with patients son: Warfarin 2mg  (1/2 tablet) MWF, 4 mg (1 tablet) all other days of the week Last dose: 6/9 PM  Current Medications:   amLODipine  5 mg Oral Daily   donepezil  10 mg Oral QHS   doxycycline  100 mg Oral Q12H   DULoxetine  30 mg Oral Daily   enoxaparin (LOVENOX) injection  80 mg Subcutaneous Q12H   fentaNYL  1 patch Transdermal Q72H   FLUoxetine  40 mg Oral Daily   gabapentin  900 mg Oral TID   guaiFENesin  600 mg Oral BID   insulin aspart  0-15 Units Subcutaneous TID WC   insulin aspart  0-5 Units Subcutaneous QHS   metoprolol tartrate  25 mg Oral BID   mometasone-formoterol  2 puff Inhalation BID   simvastatin  40 mg Oral QPM   tamsulosin  0.4 mg Oral Daily   Warfarin - Pharmacist Dosing Inpatient   Does not apply q1600    Assessment: Pt is an 5 yoM with PMH significant for afib and DVT for which is takes warfarin chronically. Recently admitted with COVID PNA, returned to the ED on 6/10 with confusion, weakness,  diarrhea. INR 8.9 was SUPRAtherapeutic on admission, no signs of bleeding, given vitamin K 1mg  IV in ED on 6/10. Note recent admission from 6/2 - 6/6 with supratherapeutic INR on admission (8.1).   Pharmacy consulted to dose warfarin while inpatient, with enoxaparin bridge while INR subtherapeutic given history of VTE.   Today, 06/17/20 INR = 2.8, large increase to upper end of therapeutic range after warfarin resumed x2 doses.   Warfarin held 6/10, Vit K 1mg  IV  Warfarin doses:  6/11 4mg , 6/12 2mg  CBC: Hgb decreased to 12.9, Plt elevated No bleeding or complications noted. SCr 0.69, CrCl ~45 mL/min Diet: Carb modified, 80-100% of meals charted Drug interactions: doxycycline    Goal of Therapy:  INR 2-3 Monitor platelets by anticoagulation protocol: Yes   Plan:  Enoxaparin d/c for INR > 2  Hold warfarin today given rapid rise PT/INR daily CBC at least q72h Monitor closely for s/sx of bleeding  Anticipate patient will likely need to discharge on lower warfarin dose than taking PTA with recommended outpatient INR check within 48-72 hours of discharge  8/11 PharmD, BCPS Clinical Pharmacist [facility] main pharmacy [phone] 06/17/2020 9:00 AM

## 2020-06-18 DIAGNOSIS — L989 Disorder of the skin and subcutaneous tissue, unspecified: Secondary | ICD-10-CM

## 2020-06-18 LAB — PROTIME-INR
INR: 2.1 — ABNORMAL HIGH (ref 0.8–1.2)
Prothrombin Time: 23.2 seconds — ABNORMAL HIGH (ref 11.4–15.2)

## 2020-06-18 LAB — CBC WITH DIFFERENTIAL/PLATELET
Abs Immature Granulocytes: 0.23 10*3/uL — ABNORMAL HIGH (ref 0.00–0.07)
Basophils Absolute: 0.1 10*3/uL (ref 0.0–0.1)
Basophils Relative: 0 %
Eosinophils Absolute: 0.1 10*3/uL (ref 0.0–0.5)
Eosinophils Relative: 1 %
HCT: 43 % (ref 39.0–52.0)
Hemoglobin: 13.5 g/dL (ref 13.0–17.0)
Immature Granulocytes: 2 %
Lymphocytes Relative: 4 %
Lymphs Abs: 0.6 10*3/uL — ABNORMAL LOW (ref 0.7–4.0)
MCH: 29.5 pg (ref 26.0–34.0)
MCHC: 31.4 g/dL (ref 30.0–36.0)
MCV: 93.9 fL (ref 80.0–100.0)
Monocytes Absolute: 1.4 10*3/uL — ABNORMAL HIGH (ref 0.1–1.0)
Monocytes Relative: 10 %
Neutro Abs: 11.8 10*3/uL — ABNORMAL HIGH (ref 1.7–7.7)
Neutrophils Relative %: 83 %
Platelets: 481 10*3/uL — ABNORMAL HIGH (ref 150–400)
RBC: 4.58 MIL/uL (ref 4.22–5.81)
RDW: 14.9 % (ref 11.5–15.5)
WBC: 14.2 10*3/uL — ABNORMAL HIGH (ref 4.0–10.5)
nRBC: 0 % (ref 0.0–0.2)

## 2020-06-18 LAB — GLUCOSE, CAPILLARY
Glucose-Capillary: 134 mg/dL — ABNORMAL HIGH (ref 70–99)
Glucose-Capillary: 111 mg/dL — ABNORMAL HIGH (ref 70–99)
Glucose-Capillary: 104 mg/dL — ABNORMAL HIGH (ref 70–99)
Glucose-Capillary: 100 mg/dL — ABNORMAL HIGH (ref 70–99)

## 2020-06-18 LAB — MAGNESIUM: Magnesium: 2 mg/dL (ref 1.7–2.4)

## 2020-06-18 LAB — BASIC METABOLIC PANEL
Anion gap: 9 (ref 5–15)
BUN: 12 mg/dL (ref 8–23)
CO2: 29 mmol/L (ref 22–32)
Calcium: 8.4 mg/dL — ABNORMAL LOW (ref 8.9–10.3)
Chloride: 99 mmol/L (ref 98–111)
Creatinine, Ser: 0.71 mg/dL (ref 0.61–1.24)
GFR, Estimated: >60 mL/min (ref >60)
Glucose, Bld: 124 mg/dL — ABNORMAL HIGH (ref 70–99)
Potassium: 3.5 mmol/L (ref 3.5–5.1)
Sodium: 137 mmol/L (ref 135–145)

## 2020-06-18 MED ORDER — WARFARIN SODIUM 2.5 MG PO TABS
2.5000 mg | ORAL_TABLET | Freq: Once | ORAL | Status: AC
  Administered 2020-06-18: 2.5 mg via ORAL
  Filled 2020-06-18: qty 1

## 2020-06-18 MED ORDER — VANCOMYCIN HCL 1000 MG/200ML IV SOLN: 1000.0000 mg | INTRAVENOUS | Status: DC

## 2020-06-18 MED ORDER — AMLODIPINE BESYLATE 10 MG PO TABS
10.0000 mg | ORAL_TABLET | Freq: Every day | ORAL | Status: DC
  Administered 2020-06-19 – 2020-06-20 (×2): 10 mg via ORAL
  Filled 2020-06-18 (×2): qty 1

## 2020-06-18 MED ORDER — VANCOMYCIN HCL 1750 MG/350ML IV SOLN
1750.0000 mg | Freq: Once | INTRAVENOUS | Status: AC
  Administered 2020-06-18: 1750 mg via INTRAVENOUS
  Filled 2020-06-18: qty 350

## 2020-06-18 NOTE — Progress Notes (Signed)
I.V. team consulted to replace previous I.V. pulled out by patient.Patient refuses to have new I.V. placed at this time. RN informed.

## 2020-06-18 NOTE — Progress Notes (Signed)
Pharmacy Antibiotic Note  Mark Russell is a 85 y.o. male admitted on 06/14/2020 with cellulitis.  Pharmacy has been consulted for vancomycin dosing.  Plan: Vancomycin 1750mg  IV x 1 then 1g IV q24 - goal trough 10-15 mcg/mL   Height: 4' (121.9 cm) Weight: 81.8 kg (180 lb 5.4 oz) IBW/kg (Calculated) : 22.4  Temp (24hrs), Avg:98.4 F (36.9 C), Min:98.1 F (36.7 C), Max:98.7 F (37.1 C)  Recent Labs  Lab 06/14/20 0915 06/15/20 0427 06/16/20 0424 06/17/20 0705 06/18/20 0348  WBC 20.6* 19.1* 16.2* 14.1* 14.2*  CREATININE 0.74 0.78 0.65 0.69 0.71    Estimated Creatinine Clearance: 45.7 mL/min (by C-G formula based on SCr of 0.71 mg/dL).    Allergies  Allergen Reactions   Ace Inhibitors     hypotension   Dilaudid [Hydromorphone Hcl]     "Mean"   Metformin And Related     GI upset   Morphine And Related Other (See Comments)    "mean"   Pletal [Cilostazol]     SOB     Thank you for allowing pharmacy to be a part of this patient's care.   06/20/20, PharmD, BCPS Secure Chat if ?s 06/18/2020 12:26 PM

## 2020-06-18 NOTE — Progress Notes (Addendum)
Progress Note    Mark Russell   PXT:[id number]  DOB: 12-13-1935  DOA: 06/14/2020     4  PCP: Maurice Small, MD  CC: weakness  Hospital Course: Mark Russell is a 85 y.o. male with medical history significant of PVD s/p B/L AKA (2007), DVT, a fib (on coumadin), HTN, DM2, recent COVID PNA (06/01/20) w/ hypoxia.  He was brought to the ER by his son after he developed worsening weakness and confusion at home.  There was also reported that his breathing was slowed and congested.  Family was concerned for COVID worsening.  He also developed diarrhea and was incontinent of stool over the past 2 to 3 days. In addition, he also developed a worsening wound on his right ear.  He denied picking at it nor any purulent drainage. CT head was ordered on admission which showed right retroauricular cellulitis with no obvious evidence of underlying abscess or fluid collection.  Progressive sinusitis noted with bilateral fluid levels.  No other acute findings. CXR showed interval clearing of infiltrate from his right base and presence of a 7 mm nodular opacity in the left upper lobe.  He was started on Rocephin and admitted for further work-up.  Interval History:  No events overnight.  This morning worsening pain right side of scalp. Has cough, denies shortness of breath.  ROS: Constitutional: negative for chills and fevers, Respiratory: negative for cough, Cardiovascular: negative for chest pain, and Gastrointestinal: negative for abdominal pain  Assessment & Plan: No new Assessment & Plan notes have been filed under this hospital service since the last note was generated. Service: Family Medicine  Cellulitis - Right retroauricular cellulitis with no fluid collection appreciated on CT head - today is day 5 of abx (rocephin>doxy) Today with significant purulence. Discussed w/ Dr. Pollyann Kennedy of ENT, Dr. Pollyann Kennedy says he will re-evaluate tomorrow - will broaden to vancomycin - culture from yesterday is  pending   Physical deconditioning - Follow-up PT/OT eval's -patient has been weak upon returning home after his recent hospitalization for COVID.  He was brought back to the hospital by his family due to concern for patient being unsafe to remain at home.  He is still against going to any sort of rehab -Reevaluated by PT today and was able to transfer out of bed safely without requiring trapeze lift   COVID-19 - Diagnosed on 06/01/2020 - Previously treated last hospitalization - Continue isolation per protocol   Diarrhea-resolved as of 06/17/2020 - Unclear etiology.  Differential includes functional versus infectious; however, patient is denying he is having any diarrhea and daughter was endorsing he did on admission - If he still has diarrhea we will test it   Acute metabolic encephalopathy-resolved as of 06/15/2020 - patient symptoms include AMS - etiology considered due to toxic/metabolic derangements from possible accumulation of home opioid use and dehydration from GI losses -Patient now alert and oriented again - Avoid oversedation as able   Lung nodule - measured at 7 mm on CXR in the LUL - will need CT chest to better evaluate; can be done outpatient   PAF (paroxysmal atrial fibrillation) (HCC) - On Coumadin -INR supratherapeutic on admission and was also previously supratherapeutic last hospitalization - No obvious reports of bleeding but received vitamin K on admission - Continue Coumadin per pharmacy.  Okay to bridge with Lovenox given immobility state, recent covid, and history of DVT - discuss with pharmacist about home dose adjustment prior to discharge -inr therapeutic today   Supratherapeutic INR -  resolved   Chronic pain - Database reviewed, on fentanyl patch 25 mcg and Percocet 7.5-325 (#120, 30d supply)   HTN (hypertension) - bp elevated - increase amlodipine to 10, cont metoprolol   HLD (hyperlipidemia) - Continue statin   Diabetes type 2 with  atherosclerosis of arteries of extremities (HCC) - Check A1c = 6.5% - Continue SSI and CBG monitoring   S/P AKA (above knee amputation) bilateral (HCC) - Uses electric wheelchair at home - Amputations were in 2007 due to PVD, per daughter  Antimicrobials: Rocephin 06/14/2020 >> 06/16/2020 Doxycycline 06/16/2020 >> 6/14 Vancomycin 6/14>  DVT prophylaxis: Coumadin with Lovenox bridge warfarin (COUMADIN) tablet 2.5 mg   Code Status:   Code Status: Full Code Family Communication: Daughter  Disposition Plan: Status is: Inpatient  Remains inpatient appropriate because:IV treatments appropriate due to intensity of illness or inability to take PO and Inpatient level of care appropriate due to severity of illness  Dispo: The patient is from:  home              Anticipated d/c is to:  home with [facility]              Patient currently is medically stable to d/c.   Difficult to place patient No  Risk of unplanned readmission score: Unplanned Admission- Pilot do not use: 24.55   Objective: Blood pressure (!) 162/75, pulse (!) 48, temperature 98.1 F (36.7 C), temperature source Oral, resp. rate 19, height 4' (1.219 m), weight 81.8 kg, SpO2 97 %.  Examination: General appearance: alert, cooperative, and no distress Head:  dried blood behind right ear with indurated skin noted, no purulent drainage; very TTP; mild edema; some induration tracking along lateral neck from lesion Eyes:  EOMI Lungs:  Mild coarse breath sounds bilaterally Heart: regular rate and rhythm and S1, S2 normal Abdomen:  Obese, soft, nontender.  Umbilical hernia appreciated that is reducible with good bowel sounds underneath Extremities:  Bilateral AKA noted Skin: erythema and ulceration behind right ear with purulence Neurologic: Grossly normal  Consultants:  ENT  Procedures:    Data Reviewed: I have personally reviewed following labs and imaging studies Results for orders placed or performed during the hospital  encounter of 06/14/20 (from the past 24 hour(s))  Glucose, capillary     Status: Abnormal   Collection Time: 06/17/20  4:13 PM  Result Value Ref Range   Glucose-Capillary 146 (H) 70 - 99 mg/dL  Glucose, capillary     Status: Abnormal   Collection Time: 06/17/20  8:51 PM  Result Value Ref Range   Glucose-Capillary 101 (H) 70 - 99 mg/dL  Protime-INR     Status: Abnormal   Collection Time: 06/18/20  3:48 AM  Result Value Ref Range   Prothrombin Time 23.2 (H) 11.4 - 15.2 seconds   INR 2.1 (H) 0.8 - 1.2  Basic metabolic panel     Status: Abnormal   Collection Time: 06/18/20  3:48 AM  Result Value Ref Range   Sodium 137 135 - 145 mmol/L   Potassium 3.5 3.5 - 5.1 mmol/L   Chloride 99 98 - 111 mmol/L   CO2 29 22 - 32 mmol/L   Glucose, Bld 124 (H) 70 - 99 mg/dL   BUN 12 8 - 23 mg/dL   Creatinine, Ser 2.50 0.61 - 1.24 mg/dL   Calcium 8.4 (L) 8.9 - 10.3 mg/dL   GFR, Estimated >53 >97 mL/min   Anion gap 9 5 - 15  CBC with Differential/Platelet  Status: Abnormal   Collection Time: 06/18/20  3:48 AM  Result Value Ref Range   WBC 14.2 (H) 4.0 - 10.5 K/uL   RBC 4.58 4.22 - 5.81 MIL/uL   Hemoglobin 13.5 13.0 - 17.0 g/dL   HCT 16.1 09.6 - 04.5 %   MCV 93.9 80.0 - 100.0 fL   MCH 29.5 26.0 - 34.0 pg   MCHC 31.4 30.0 - 36.0 g/dL   RDW 40.9 81.1 - 91.4 %   Platelets 481 (H) 150 - 400 K/uL   nRBC 0.0 0.0 - 0.2 %   Neutrophils Relative % 83 %   Neutro Abs 11.8 (H) 1.7 - 7.7 K/uL   Lymphocytes Relative 4 %   Lymphs Abs 0.6 (L) 0.7 - 4.0 K/uL   Monocytes Relative 10 %   Monocytes Absolute 1.4 (H) 0.1 - 1.0 K/uL   Eosinophils Relative 1 %   Eosinophils Absolute 0.1 0.0 - 0.5 K/uL   Basophils Relative 0 %   Basophils Absolute 0.1 0.0 - 0.1 K/uL   Immature Granulocytes 2 %   Abs Immature Granulocytes 0.23 (H) 0.00 - 0.07 K/uL  Magnesium     Status: None   Collection Time: 06/18/20  3:48 AM  Result Value Ref Range   Magnesium 2.0 1.7 - 2.4 mg/dL  Aerobic/Anaerobic Culture w Gram Stain  (surgical/deep wound)     Status: None (Preliminary result)   Collection Time: 06/18/20  3:49 AM   Specimen: Neck; Wound  Result Value Ref Range   Specimen Description      NECK Performed at [facility], [address] [place], [place] [address]    Special Requests      Normal Performed at [facility], [address] [place], [place] [address]    Gram Stain      ABUNDANT WBC PRESENT,BOTH PMN AND MONONUCLEAR MODERATE GRAM POSITIVE COCCI IN PAIRS IN CLUSTERS Performed at [facility], [address], [place], [place] [address]    Culture PENDING    Report Status PENDING   Glucose, capillary     Status: Abnormal   Collection Time: 06/18/20  8:12 AM  Result Value Ref Range   Glucose-Capillary 134 (H) 70 - 99 mg/dL    Recent Results (from the past 240 hour(s))  Aerobic/Anaerobic Culture w Gram Stain (surgical/deep wound)     Status: None (Preliminary result)   Collection Time: 06/18/20  3:49 AM   Specimen: Neck; Wound  Result Value Ref Range Status   Specimen Description   Final    NECK Performed at [facility], [address], [place], [place] [address]    Special Requests   Final    Normal Performed at [facility], [address], [place], [place] [address]    Gram Stain   Final    ABUNDANT WBC PRESENT,BOTH PMN AND MONONUCLEAR MODERATE GRAM POSITIVE COCCI IN PAIRS IN CLUSTERS Performed at [facility], [address] [place], [place] [address]    Culture PENDING  Incomplete   Report Status PENDING  Incomplete      Radiology Studies: No results found. CT Head Wo Contrast  Final Result    DG Chest Port 1 View  Final Result      Scheduled Meds:  amLODipine  5 mg Oral Daily   donepezil  10 mg Oral QHS   doxycycline  100 mg Oral Q12H   DULoxetine  30 mg Oral Daily   fentaNYL  1 patch Transdermal Q72H   FLUoxetine  40 mg Oral Daily   gabapentin  900 mg Oral TID    guaiFENesin  600 mg Oral BID   insulin aspart  0-15 Units Subcutaneous TID WC   insulin aspart  0-5 Units Subcutaneous QHS   metoprolol tartrate  25 mg Oral BID   mometasone-formoterol  2 puff Inhalation BID   simvastatin  40 mg Oral QPM   tamsulosin  0.4 mg Oral Daily   warfarin  2.5 mg Oral ONCE-1600   Warfarin - Pharmacist Dosing Inpatient   Does not apply q1600   PRN Meds: acetaminophen, albuterol, hydrALAZINE, lip balm, ondansetron **OR** ondansetron (ZOFRAN) IV Continuous Infusions:     LOS: 4 days  Time spent: Greater than 50% of the 35 minute visit was spent in counseling/coordination of care for the patient as laid out in the A&P.   Silvano Bilis, MD [facility] 06/18/2020, 12:02 PM

## 2020-06-18 NOTE — Care Management Important Message (Signed)
Important Message  Patient Details IM Letter given to the Patient. Name: Christoher Drudge MRN: [id number] Date of Birth: 23-Oct-1935   Medicare Important Message Given:  Yes     Caren Macadam 06/18/2020, 12:45 PM

## 2020-06-18 NOTE — Progress Notes (Signed)
Occupational Therapy Treatment Patient Details Name: Mark Russell MRN: [id number] DOB: 1935-12-11 Today's Date: 06/18/2020    History of present illness Mark Russell is a 85 y.o. male  Presenting with weakness and confusion..  Dx of cellulitis posterior to R ear. Pt with medical history significant of PVD, DVT on anticoagulation, a fib, HTN, DM2, recent COVID PNA w/ hypoxia, h/o bil. AKA. He was recently d/c from [facility] on 06/10/20 for covid admission   OT comments  Unfortunately, patient unable to demonstrate any progress this visit which was very limited due to pt agitation and LT distal LE pain.  Pt did agree to demonstrate bed mobility to long sitting with heavy use of bed rails, and was somewhat attentive to demonstration on use of theraband for UE strengthening, but otherwise was agitated, cursing and eventually told OT to leave room. Patient remains limited by issues mentioned above, along with deficits noted below and apparent confusion. Pt continues to demonstrate fair rehab potential and may benefit from continued skilled OT to increase safety and independence with ADLs and functional transfers to allow pt to return home safely and reduce caregiver burden and fall risk.   Follow Up Recommendations  Home health OT;Supervision/Assistance - 24 hour    Equipment Recommendations  None recommended by OT    Recommendations for Other Services      Precautions / Restrictions Precautions Precautions: Fall Precaution Comments: h/o Bil AKA Restrictions Weight Bearing Restrictions: No       Mobility Bed Mobility Overal bed mobility: Modified Independent Bed Mobility: Supine to Sit     Supine to sit: HOB elevated;Modified independent (Device/Increase time)     General bed mobility comments: with use of bed rails, no physical assist required, HOB up ~45*, pt transitioned from supine to long sitting. Pt refused to scoot further or transition fully to EOB. Pt gave no reason except, "no".     Transfers                 General transfer comment: Pt refused this session.    Balance Overall balance assessment: Needs assistance Sitting-balance support: Bilateral upper extremity supported Sitting balance-Leahy Scale: Fair Sitting balance - Comments: In long sitting.                                   ADL either performed or assessed with clinical judgement   ADL Overall ADL's :  (Pt refused to engage in ADLs this visit despite encouragement.)                                             Vision       Perception     Praxis      Cognition Arousal/Alertness: Awake/alert Behavior During Therapy: Agitated;Restless Overall Cognitive Status: No family/caregiver present to determine baseline cognitive functioning                   Orientation Level:  (Pt would not answer orientation questions.)                      Exercises Other Exercises Other Exercises: Brought light green (med-light) resistance theraband to pt's room.  Pt reported, "I know what that is, and I'm not going to do anything with it." Pt was gently encouraged with increased  irritability. OT then, demonstrated BUE exercises with therband with pt appearing to attend to uses and different exercises.  Pt was encougared tro perform ad lib and to rest as needed. Pt again responded, "Bull shit." and turned away from therapist.   Shoulder Instructions       General Comments      Pertinent Vitals/ Pain       Pain Assessment: Faces Faces Pain Scale: Hurts even more Pain Location: LT distal residual limb. Pt reports this happens chronically. Pain Descriptors / Indicators: Grimacing;Guarding Pain Intervention(s): Limited activity within patient's tolerance;Monitored during session;Other (comment) (Pt asked if he would like to request pain meds. Pt responded, "Bull shit".)  Home Living                                          Prior  Functioning/Environment              Frequency  Min 2X/week        Progress Toward Goals  OT Goals(current goals can now be found in the care plan section)  Progress towards OT goals: Not progressing toward goals - comment  Acute Rehab OT Goals Patient Stated Goal: pt wants to go home to his dog  Plan      Co-evaluation                 AM-PAC OT "6 Clicks" Daily Activity     Outcome Measure   Help from another person eating meals?: A Little Help from another person taking care of personal grooming?: A Little Help from another person toileting, which includes using toliet, bedpan, or urinal?: A Lot Help from another person bathing (including washing, rinsing, drying)?: A Lot Help from another person to put on and taking off regular upper body clothing?: A Lot Help from another person to put on and taking off regular lower body clothing?: Total 6 Click Score: 13    End of Session    OT Visit Diagnosis: Cognitive communication deficit (R41.841)   Activity Tolerance Treatment limited secondary to agitation   Patient Left in bed;with call bell/phone within reach;with bed alarm set   Nurse Communication  (RN cleared OT to see pt.)        Time: 1245-8099 OT Time Calculation (min): 9 min  Charges: OT General Charges $OT Visit: 1 Visit OT Treatments $Therapeutic Activity: 8-22 mins  Victorino Dike, OT Acute Rehab Services Office: [phone] 06/18/2020   Theodoro Clock 06/18/2020, 10:35 AM

## 2020-06-18 NOTE — Plan of Care (Signed)
  Problem: Education: Goal: Knowledge of General Education information will improve Description: Including pain rating scale, medication(s)/side effects and non-pharmacologic comfort measures Outcome: Progressing   Problem: Health Behavior/Discharge Planning: Goal: Ability to manage health-related needs will improve Outcome: Progressing   Problem: Clinical Measurements: Goal: Ability to maintain clinical measurements within normal limits will improve Outcome: Progressing Goal: Will remain free from infection Outcome: Progressing Goal: Diagnostic test results will improve Outcome: Progressing Goal: Respiratory complications will improve Outcome: Progressing Goal: Cardiovascular complication will be avoided Outcome: Progressing   Problem: Activity: Goal: Risk for activity intolerance will decrease Outcome: Progressing   Problem: Nutrition: Goal: Adequate nutrition will be maintained Outcome: Progressing   Problem: Coping: Goal: Level of anxiety will decrease Outcome: Progressing   Problem: Elimination: Goal: Will not experience complications related to bowel motility Outcome: Progressing Goal: Will not experience complications related to urinary retention Outcome: Progressing   Problem: Pain Managment: Goal: General experience of comfort will improve Outcome: Progressing   Problem: Safety: Goal: Ability to remain free from injury will improve Outcome: Progressing   Problem: Skin Integrity: Goal: Risk for impaired skin integrity will decrease Outcome: Progressing   Problem: Education: Goal: Knowledge of risk factors and measures for prevention of condition will improve Outcome: Progressing   Problem: Respiratory: Goal: Complications related to the disease process, condition or treatment will be avoided or minimized Outcome: Progressing   

## 2020-06-18 NOTE — Progress Notes (Signed)
ANTICOAGULATION CONSULT NOTE  Pharmacy Consult for warfarin Indication: atrial fibrillation and history of VTE  Allergies  Allergen Reactions   Ace Inhibitors     hypotension   Dilaudid [Hydromorphone Hcl]     "Mean"   Metformin And Related     GI upset   Morphine And Related Other (See Comments)    "mean"   Pletal [Cilostazol]     SOB    Patient Measurements: Height: 4' (121.9 cm) Weight: 81.8 kg (180 lb 5.4 oz) IBW/kg (Calculated) : 22.4  Vital Signs: Temp: 98.5 F (36.9 C) (06/14 0344) Temp Source: Oral (06/14 0344) BP: 178/76 (06/14 0344) Pulse Rate: 70 (06/14 0344)  Labs: Recent Labs    06/16/20 0424 06/17/20 0705 06/18/20 0348  HGB 13.3 12.9* 13.5  HCT 43.4 41.8 43.0  PLT 505* 477* 481*  LABPROT 20.1* 29.4* 23.2*  INR 1.7* 2.8* 2.1*  CREATININE 0.65 0.69 0.71     Estimated Creatinine Clearance: 45.7 mL/min (by C-G formula based on SCr of 0.71 mg/dL).  PTA Warfarin:  Home dose confirmed with patients son: Warfarin 2mg  (1/2 tablet) MWF, 4 mg (1 tablet) all other days of the week Last dose: 6/9 PM  Current Medications:   amLODipine  5 mg Oral Daily   donepezil  10 mg Oral QHS   doxycycline  100 mg Oral Q12H   DULoxetine  30 mg Oral Daily   fentaNYL  1 patch Transdermal Q72H   FLUoxetine  40 mg Oral Daily   gabapentin  900 mg Oral TID   guaiFENesin  600 mg Oral BID   insulin aspart  0-15 Units Subcutaneous TID WC   insulin aspart  0-5 Units Subcutaneous QHS   metoprolol tartrate  25 mg Oral BID   mometasone-formoterol  2 puff Inhalation BID   simvastatin  40 mg Oral QPM   tamsulosin  0.4 mg Oral Daily   Warfarin - Pharmacist Dosing Inpatient   Does not apply q1600    Assessment: Pt is an 66 yoM with PMH significant for afib and DVT for which is takes warfarin chronically. Recently admitted with COVID PNA, returned to the ED on 6/10 with confusion, weakness, diarrhea. INR 8.9 was SUPRAtherapeutic on admission, no signs of bleeding, given vitamin  K 1mg  IV in ED on 6/10. Note recent admission from 6/2 - 6/6 with supratherapeutic INR on admission (8.1).   Warfarin home dosage prior to admission: 4mg  daily except 2mg  on MWF  Pharmacy consulted to dose warfarin while inpatient, with enoxaparin bridge while INR subtherapeutic given history of VTE.   Today, 06/18/20 INR still therapeutic but did decrease from 2.8 to 2.1, has gotten warfarin doses of 4mg , 2mg  and 0mg  Warfarin held 6/10, Vit K 1mg  IV  CBC: stable No bleeding or complications noted. Diet: Carb modified, 80-100% of meals charted Drug interactions: doxycycline    Goal of Therapy:  INR 2-3 Monitor platelets by anticoagulation protocol: Yes   Plan:  Warfarin 2.5mg  today If discharged, could consider warfarin changing warfarin dosing to something like 2.5mg  daily PT/INR daily CBC at least q72h Monitor closely for s/sx of bleeding  Anticipate patient will likely need to discharge on lower warfarin dose than taking PTA with recommended outpatient INR check within 48-72 hours of discharge  , PharmD, BCPS Secure Chat if ?s 06/18/2020 10:17 AM

## 2020-06-19 DIAGNOSIS — L03811 Cellulitis of head [any part, except face]: Principal | ICD-10-CM

## 2020-06-19 DIAGNOSIS — U071 COVID-19: Secondary | ICD-10-CM | POA: Diagnosis not present

## 2020-06-19 DIAGNOSIS — G8929 Other chronic pain: Secondary | ICD-10-CM

## 2020-06-19 DIAGNOSIS — L03221 Cellulitis of neck: Secondary | ICD-10-CM

## 2020-06-19 DIAGNOSIS — G9341 Metabolic encephalopathy: Secondary | ICD-10-CM | POA: Diagnosis not present

## 2020-06-19 LAB — CBC WITH DIFFERENTIAL/PLATELET
Abs Immature Granulocytes: 0.19 10*3/uL — ABNORMAL HIGH (ref 0.00–0.07)
Basophils Absolute: 0.1 10*3/uL (ref 0.0–0.1)
Basophils Relative: 1 %
Eosinophils Absolute: 0.1 10*3/uL (ref 0.0–0.5)
Eosinophils Relative: 1 %
HCT: 47 % (ref 39.0–52.0)
Hemoglobin: 14.5 g/dL (ref 13.0–17.0)
Immature Granulocytes: 1 %
Lymphocytes Relative: 6 %
Lymphs Abs: 0.9 10*3/uL (ref 0.7–4.0)
MCH: 29.5 pg (ref 26.0–34.0)
MCHC: 30.9 g/dL (ref 30.0–36.0)
MCV: 95.5 fL (ref 80.0–100.0)
Monocytes Absolute: 1.3 10*3/uL — ABNORMAL HIGH (ref 0.1–1.0)
Monocytes Relative: 9 %
Neutro Abs: 11.9 10*3/uL — ABNORMAL HIGH (ref 1.7–7.7)
Neutrophils Relative %: 82 %
Platelets: 546 10*3/uL — ABNORMAL HIGH (ref 150–400)
RBC: 4.92 MIL/uL (ref 4.22–5.81)
RDW: 15.4 % (ref 11.5–15.5)
WBC: 14.5 10*3/uL — ABNORMAL HIGH (ref 4.0–10.5)
nRBC: 0 % (ref 0.0–0.2)

## 2020-06-19 LAB — BASIC METABOLIC PANEL
Anion gap: 9 (ref 5–15)
BUN: 14 mg/dL (ref 8–23)
CO2: 32 mmol/L (ref 22–32)
Calcium: 8.7 mg/dL — ABNORMAL LOW (ref 8.9–10.3)
Chloride: 97 mmol/L — ABNORMAL LOW (ref 98–111)
Creatinine, Ser: 0.76 mg/dL (ref 0.61–1.24)
GFR, Estimated: >60 mL/min (ref >60)
Glucose, Bld: 117 mg/dL — ABNORMAL HIGH (ref 70–99)
Potassium: 3.4 mmol/L — ABNORMAL LOW (ref 3.5–5.1)
Sodium: 138 mmol/L (ref 135–145)

## 2020-06-19 LAB — PROTIME-INR
INR: 2.3 — ABNORMAL HIGH (ref 0.8–1.2)
Prothrombin Time: 25.5 seconds — ABNORMAL HIGH (ref 11.4–15.2)

## 2020-06-19 LAB — GLUCOSE, CAPILLARY
Glucose-Capillary: 114 mg/dL — ABNORMAL HIGH (ref 70–99)
Glucose-Capillary: 130 mg/dL — ABNORMAL HIGH (ref 70–99)
Glucose-Capillary: 111 mg/dL — ABNORMAL HIGH (ref 70–99)
Glucose-Capillary: 208 mg/dL — ABNORMAL HIGH (ref 70–99)

## 2020-06-19 LAB — MAGNESIUM: Magnesium: 2.1 mg/dL (ref 1.7–2.4)

## 2020-06-19 MED ORDER — AMLODIPINE BESYLATE 10 MG PO TABS: 10.0000 mg | ORAL_TABLET | Freq: Every day | ORAL | 0 refills | Status: DC

## 2020-06-19 MED ORDER — POTASSIUM CHLORIDE ER 10 MEQ PO TBCR: 10.0000 meq | EXTENDED_RELEASE_TABLET | Freq: Two times a day (BID) | ORAL | Status: DC

## 2020-06-19 MED ORDER — DOXYCYCLINE HYCLATE 100 MG PO TABS
100.0000 mg | ORAL_TABLET | Freq: Two times a day (BID) | ORAL | Status: DC
  Administered 2020-06-19 – 2020-06-20 (×3): 100 mg via ORAL
  Filled 2020-06-19 (×3): qty 1

## 2020-06-19 MED ORDER — DOXYCYCLINE HYCLATE 100 MG PO TABS: 100.0000 mg | ORAL_TABLET | Freq: Two times a day (BID) | ORAL | 0 refills | Status: AC

## 2020-06-19 NOTE — Plan of Care (Signed)
  Problem: Education: Goal: Knowledge of General Education information will improve Description: Including pain rating scale, medication(s)/side effects and non-pharmacologic comfort measures Outcome: Progressing   Problem: Health Behavior/Discharge Planning: Goal: Ability to manage health-related needs will improve Outcome: Progressing   Problem: Clinical Measurements: Goal: Ability to maintain clinical measurements within normal limits will improve Outcome: Progressing Goal: Will remain free from infection Outcome: Progressing Goal: Diagnostic test results will improve Outcome: Progressing Goal: Respiratory complications will improve Outcome: Progressing Goal: Cardiovascular complication will be avoided Outcome: Progressing   Problem: Activity: Goal: Risk for activity intolerance will decrease Outcome: Progressing   Problem: Nutrition: Goal: Adequate nutrition will be maintained Outcome: Progressing   Problem: Coping: Goal: Level of anxiety will decrease Outcome: Progressing   Problem: Elimination: Goal: Will not experience complications related to bowel motility Outcome: Progressing Goal: Will not experience complications related to urinary retention Outcome: Progressing   Problem: Pain Managment: Goal: General experience of comfort will improve Outcome: Progressing   Problem: Safety: Goal: Ability to remain free from injury will improve Outcome: Progressing   Problem: Skin Integrity: Goal: Risk for impaired skin integrity will decrease Outcome: Progressing   Problem: Education: Goal: Knowledge of risk factors and measures for prevention of condition will improve Outcome: Progressing   Problem: Respiratory: Goal: Complications related to the disease process, condition or treatment will be avoided or minimized Outcome: Progressing   

## 2020-06-19 NOTE — Discharge Summary (Signed)
Physician Discharge Summary  Mark Russell ZOX:[id number] DOB: 1935-05-03 DOA: 06/14/2020  PCP: Maurice Small, MD  Admit date: 06/14/2020 Discharge date: 06/19/2020  Admitted From: Home Disposition: Home  Recommendations for Outpatient Follow-up:  Follow up with PCP in 1 week with repeat CBC/BMP Recommend outpatient evaluation and follow-up by palliative care Outpatient follow-up with ENT Follow up in ED if symptoms worsen or new appear   Home Health: Home health OT  equipment/Devices: None  Discharge Condition: Guarded CODE STATUS: Full Diet recommendation: Heart healthy  Brief/Interim Summary: 85 y.o. male with medical history significant of PVD s/p B/L AKA (2007), DVT, a fib (on coumadin), HTN, DM2, recent COVID PNA (06/01/20) with hypoxia presented with weakness and confusion along with worsening wound on his right ear.  On presentation, CT head showed right retroauricular cellulitis.  Chest x-ray showed interval clearing of infiltrate from his right base and presence of a 7 mm nodular opacity in the left upper lobe.  He initially was started on Rocephin which was switched to doxycycline.  Because of some purulence from the retroauricular wound, ENT was consulted and patient was started on IV vancomycin.  Subsequently, there is not much drainage and patient was to go home.  ENT has cleared the patient for discharge with outpatient follow-up with ENT.  He will be discharged home on oral doxycycline for 7 more days.  Discharge Diagnoses:   Right retroauricular cellulitis with possible abscess -No fluid collection seen on CT of the head on presentation.  Treated with Rocephin and was switched to oral doxycycline.  Subsequently, there was a concern for worsening and purulence.  He was then switched to IV vancomycin and ENT was consulted. -Wound culture is growing staph.  Patient has pulled out IV line and does not want any more IV and is adamant about going home today.  He will be  discharged home today on oral doxycycline for 7 days.  Outpatient follow-up with ENT.  COVID-19 infection -Diagnosed on 06/01/2020.  Previously treated during last hospitalization.  Currently on room air.  Generalized deconditioning -Needs home health OT.  Recommend outpatient palliative care evaluation and follow-up for goals of care discussion  Diarrhea -Unclear etiology.  Resolved  Acute metabolic encephalopathy Chronic pain -Questionable cause.  Resolved.  Patient is also on high doses of pain medications including gabapentin.  Recommend outpatient follow-up with PCP regarding adjustment of doses.  Lung nodule -Measured at 7 mm on chest x-ray in the left lower lobe.  Outpatient follow-up can be done by PCP: Might need CT chest to better evaluate  Paroxysmal A. Fib Supratherapeutic INR: Resolved -INR currently therapeutic.  Continue Coumadin on discharge.  Status post bilateral AKA -Outpatient follow-up  Hypertension -Continue metoprolol and increased dose of amlodipine 10 mg daily.   Discharge Instructions  Discharge Instructions     Diet - low sodium heart healthy   Complete by: As directed    Increase activity slowly   Complete by: As directed    No wound care   Complete by: As directed       Allergies as of 06/19/2020       Reactions   Ace Inhibitors    hypotension   Dilaudid [hydromorphone Hcl]    "Mean"   Metformin And Related    GI upset   Morphine And Related Other (See Comments)   "mean"   Pletal [cilostazol]    SOB        Medication List     STOP taking these medications  DULoxetine 30 MG capsule Commonly known as: CYMBALTA   glimepiride 4 MG tablet Commonly known as: AMARYL   predniSONE 10 MG tablet Commonly known as: DELTASONE       TAKE these medications    acetaminophen 325 MG tablet Commonly known as: TYLENOL Take 650 mg by mouth every 6 (six) hours as needed for mild pain, fever or headache.   albuterol 108 (90 Base)  MCG/ACT inhaler Commonly known as: VENTOLIN HFA Inhale 2 puffs into the lungs every 6 (six) hours as needed for wheezing or shortness of breath.   amLODipine 10 MG tablet Commonly known as: NORVASC Take 1 tablet (10 mg total) by mouth daily. Start taking on: June 20, 2020 What changed:  medication strength how much to take   bismuth subsalicylate 262 MG/15ML suspension Commonly known as: PEPTO BISMOL Take 30 mLs by mouth every 6 (six) hours as needed for diarrhea or loose stools.   donepezil 10 MG tablet Commonly known as: ARICEPT Take 10 mg by mouth at bedtime.   doxycycline 100 MG tablet Commonly known as: VIBRA-TABS Take 1 tablet (100 mg total) by mouth every 12 (twelve) hours for 7 days.   fentaNYL 25 MCG/HR Commonly known as: DURAGESIC Place 1 patch onto the skin every other day.   FLUoxetine 20 MG capsule Commonly known as: PROZAC Take 40 mg by mouth daily.   gabapentin 300 MG capsule Commonly known as: NEURONTIN Take 900 mg by mouth 3 (three) times daily.   guaiFENesin-dextromethorphan 100-10 MG/5ML syrup Commonly known as: ROBITUSSIN DM Take 10 mLs by mouth every 4 (four) hours as needed for cough.   loperamide 2 MG capsule Commonly known as: IMODIUM Take by mouth as needed for diarrhea or loose stools.   Menthol (Topical Analgesic) 2 % Gel Apply 1 application topically 3 (three) times daily as needed (for extremity pain).   mineral oil-hydrophilic petrolatum ointment Apply 1 application topically as needed (bed sores).   Percocet 7.5-325 MG tablet Generic drug: oxyCODONE-acetaminophen Take 1 tablet by mouth every 6 (six) hours as needed for pain.   potassium chloride 10 MEQ tablet Commonly known as: KLOR-CON Take 1 tablet (10 mEq total) by mouth 2 (two) times daily for 14 days.   simvastatin 40 MG tablet Commonly known as: ZOCOR Take 40 mg by mouth every evening.   Symbicort 160-4.5 MCG/ACT inhaler Generic drug: budesonide-formoterol Inhale 3  puffs into the lungs every 6 (six) hours as needed for wheezing or shortness of breath.   tamsulosin 0.4 MG Caps capsule Commonly known as: FLOMAX Take 0.4 mg by mouth daily.   VITAMIN C PO Take 1 tablet by mouth daily.   Vitamin D 50 MCG (2000 UT) tablet Take 2,000 Units by mouth daily.   warfarin 4 MG tablet Commonly known as: COUMADIN Take 2-4 mg by mouth See admin instructions. 2mg  on Monday Wendesday Friday and 4mg  on all other days   zinc gluconate 50 MG tablet Take 50 mg by mouth daily.       ASK your doctor about these medications    metoprolol tartrate 25 MG tablet Commonly known as: LOPRESSOR Take 1 tablet (25 mg total) by mouth 2 (two) times daily.        Follow-up Information     Monday, MD. Schedule an appointment as soon as possible for a visit in 1 week(s).   Specialty: Otolaryngology Why: Call for follow-up sooner if it gets any worse. Contact information: [address] [place]  [place] [address] [phone]         Maurice Small, MD. Schedule an appointment as soon as possible for a visit in 1 week(s).   Specialty: Family Medicine Why: With repeat CBC/BMP/INR Contact information: [address] [place] [address] [phone]                Allergies  Allergen Reactions   Ace Inhibitors     hypotension   Dilaudid [Hydromorphone Hcl]     "Mean"   Metformin And Related     GI upset   Morphine And Related Other (See Comments)    "mean"   Pletal [Cilostazol]     SOB    Consultations: ENT   Procedures/Studies: DG Chest 1 View  Result Date: 06/01/2020 CLINICAL DATA:  Cough EXAM: CHEST  1 VIEW COMPARISON:  06/15/2019 FINDINGS: Mild scarring in the left upper lobe. Calcified granuloma in the left mid lung, benign. Right lung is clear, with surgical clips overlying the right upper hemithorax. No pleural effusion or pneumothorax. The heart is normal in size.  Thoracic aortic  atherosclerosis. IMPRESSION: No evidence of acute cardiopulmonary disease. Electronically Signed   By: Charline Bills M.D.   On: 06/01/2020 11:37   DG Pelvis 1-2 Views  Result Date: 06/01/2020 CLINICAL DATA:  Developing pressure ulcer. EXAM: PELVIS - 1-2 VIEW COMPARISON:  06/18/2006 FINDINGS: Severe left hip osteoarthritis with joint distortion, joint collapse, and sclerosis. There may have been a prior femoral neck fracture or AVN. Bilateral postoperative inguinal or hip regions. No gross destructive process or sacroiliac erosion. There is generalized osteopenia. IMPRESSION: 1. Very limited modality for detecting osteomyelitis. 2. Advanced left hip osteoarthritis. Electronically Signed   By: Marnee Spring M.D.   On: 06/01/2020 11:38   CT Head Wo Contrast  Result Date: 06/14/2020 CLINICAL DATA:  Mental status change with unknown cause. Retro auricular cellulitis on the right EXAM: CT HEAD WITHOUT CONTRAST TECHNIQUE: Contiguous axial images were obtained from the base of the skull through the vertex without intravenous contrast. COMPARISON:  06/06/2020 FINDINGS: Brain: Generalized atrophy. Remote infarct at the high left frontal parietal junction where there is asymmetric gyral narrowing. Small remote left cerebellar infarction. Vascular: No hyperdense vessel or unexpected calcification. Skull: History of right retroauricular cellulitis correlating with new subcutaneous reticulation. No discrete rounded area to imply abscess. No soft tissue gas. Sinuses/Orbits: Continued sinus inflammation with bilateral maxillary and sphenoid fluid levels, progressed from prior. Bilateral cataract resection. IMPRESSION: 1. No acute intracranial finding. 2. Right retroauricular cellulitis without complicating features. 3. Progressive sinusitis with bilateral fluid level. Electronically Signed   By: Marnee Spring M.D.   On: 06/14/2020 10:27   CT Head Wo Contrast  Result Date: 06/06/2020 CLINICAL DATA:  Delirium.  EXAM: CT HEAD WITHOUT CONTRAST TECHNIQUE: Contiguous axial images were obtained from the base of the skull through the vertex without intravenous contrast. COMPARISON:  None. FINDINGS: Motion limited study.  Within this limitation: Brain: No evidence of acute large vascular territory infarction, hemorrhage, hydrocephalus, extra-axial collection or mass effect. Multiple areas of ossification along the left convexity, measuring up to 5 mm in thickness convexity (series 2, image 53). Moderate generalized cerebral volume loss. Mild patchy white matter hypoattenuation, most likely related to chronic microvascular ischemic disease. Vascular: No hyperdense vessel identified. Calcific intracranial atherosclerosis. Skull: No acute fracture. Sinuses/Orbits: Marked ethmoid air cell mucosal thickening with frothy secretions. Moderate left and mild right frontal sinus mucosal thickening with left frontoethmoidal frothy secretion/opacification. Near complete opacification of  the right maxillary sinus and left maxillary sinus mucosal thickening. Bilateral sphenoid air-fluid levels. Other: No mastoid effusions. IMPRESSION: 1. No evidence of acute intracranial abnormality on this motion limited study. 2. Moderate cerebral atrophy and mild chronic microvascular ischemic disease. 3. Significant paranasal sinus disease, detailed above and suggestive of sinusitis. 4. Multiple areas of ossification along the left convexity (measuring up to 5 mm in thickness) that are favored to reflect dural ossification over ossified meningiomas. No significant mass effect. Electronically Signed   By: Feliberto HartsFrederick S Jones MD   On: 06/06/2020 10:48   DG Chest Port 1 View  Result Date: 06/14/2020 CLINICAL DATA:  Cough and weakness EXAM: PORTABLE CHEST 1 VIEW COMPARISON:  June 06, 2020 FINDINGS: There is a 7 mm nodular opacity in the left upper lobe. There is bibasilar atelectasis. There has been interval clearing of infiltrate from the right base. There  is mild atelectasis in the left base. The heart is upper normal in size with pulmonary vascularity normal. No adenopathy. There is aortic atherosclerosis. No bone lesions. IMPRESSION: 1.  Interval clearing of infiltrate from the right base. 2. 7 mm nodular opacity left upper lobe. Correlation noncontrast enhanced chest CT advised given this nodular opacity. 3.  Atelectasis left base. 4. Stable cardiac silhouette. Aortic Atherosclerosis (ICD10-I70.0). Electronically Signed   By: Bretta BangWilliam  Woodruff III M.D.   On: 06/14/2020 09:27   DG Chest Port 1 View  Result Date: 06/06/2020 CLINICAL DATA:  Shortness of breath, COVID positive 06/01/2020 EXAM: PORTABLE CHEST 1 VIEW COMPARISON:  06/01/2020 FINDINGS: Increased mild mid and lower lung mixed interstitial and airspace opacities, worse on the right compatible with COVID related pneumonia. No large effusion or pneumothorax. Stable cardiomegaly without CHF. Aorta atherosclerotic. Degenerative changes of the spine and left shoulder. IMPRESSION: New mild mid and lower lung opacities, worse on the right compatible with COVID related pneumonia. Aortic Atherosclerosis (ICD10-I70.0). Electronically Signed   By: Judie PetitM.  Shick M.D.   On: 06/06/2020 08:17      Subjective: Patient seen and examined at bedside.  Feels much better and wants to go home today.  No overnight fever, vomiting, worsening right ear pain.  Discharge Exam: Vitals:   06/19/20 0615 06/19/20 1210  BP: (!) 160/79 (!) 152/56  Pulse: 62 (!) 53  Resp: 18 18  Temp: 98.4 F (36.9 C) 98.1 F (36.7 C)  SpO2: 92% 96%    General: Pt is alert, awake, not in acute distress.  Currently on room air. Cardiovascular: rate controlled, S1/S2 + Respiratory: bilateral decreased breath sounds at bases Abdominal: Soft, NT, ND, bowel sounds + Extremities: Bilateral AKA; no cyanosis Skin: Erythema and ulceration in right ear present    The results of significant diagnostics from this hospitalization (including  imaging, microbiology, ancillary and laboratory) are listed below for reference.     Microbiology: Recent Results (from the past 240 hour(s))  Aerobic/Anaerobic Culture w Gram Stain (surgical/deep wound)     Status: None (Preliminary result)   Collection Time: 06/18/20  3:49 AM   Specimen: Neck; Wound  Result Value Ref Range Status   Specimen Description   Final    NECK Performed at [facility], [address] [place], [place] [address]    Special Requests   Final    Normal Performed at [facility], [address] [place], [place] [address]    Gram Stain   Final    ABUNDANT WBC PRESENT,BOTH PMN AND MONONUCLEAR MODERATE GRAM POSITIVE COCCI IN PAIRS IN  CLUSTERS    Culture   Final    MODERATE STAPHYLOCOCCUS AUREUS SUSCEPTIBILITIES TO FOLLOW Performed at [facility], [address] [place], [place] [address]    Report Status PENDING  Incomplete     Labs: BNP (last 3 results) No results for input(s): BNP in the last 8760 hours. Basic Metabolic Panel: Recent Labs  Lab 06/15/20 0427 06/16/20 0424 06/17/20 0705 06/18/20 0348 06/19/20 0859  NA 137 135 134* 137 138  K 3.5 3.3* 4.3 3.5 3.4*  CL 96* 95* 95* 99 97*  CO2 31 30 32 29 32  GLUCOSE 109* 108* 159* 124* 117*  BUN 12 14 12 12 14   CREATININE 0.78 0.65 0.69 0.71 0.76  CALCIUM 8.2* 8.1* 8.0* 8.4* 8.7*  MG  --  2.2 2.0 2.0 2.1   Liver Function Tests: Recent Labs  Lab 06/14/20 0915 06/15/20 0427  AST 19 21  ALT 21 22  ALKPHOS 52 56  BILITOT 0.7 0.9  PROT 6.7 7.2  ALBUMIN 2.6* 2.7*   No results for input(s): LIPASE, AMYLASE in the last 168 hours. No results for input(s): AMMONIA in the last 168 hours. CBC: Recent Labs  Lab 06/14/20 0915 06/15/20 0427 06/16/20 0424 06/17/20 0705 06/18/20 0348 06/19/20 0859  WBC 20.6* 19.1* 16.2* 14.1* 14.2* 14.5*  NEUTROABS 18.3*  --  13.4* 12.0* 11.8* 11.9*  HGB 14.1 14.1 13.3 12.9* 13.5 14.5  HCT 45.0 45.9 43.4 41.8  43.0 47.0  MCV 93.8 95.4 93.9 95.2 93.9 95.5  PLT 527* 471* 505* 477* 481* 546*   Cardiac Enzymes: No results for input(s): CKTOTAL, CKMB, CKMBINDEX, TROPONINI in the last 168 hours. BNP: Invalid input(s): POCBNP CBG: Recent Labs  Lab 06/18/20 1203 06/18/20 1751 06/18/20 2142 06/19/20 0733 06/19/20 1204  GLUCAP 111* 104* 100* 114* 130*   D-Dimer No results for input(s): DDIMER in the last 72 hours. Hgb A1c No results for input(s): HGBA1C in the last 72 hours. Lipid Profile No results for input(s): CHOL, HDL, LDLCALC, TRIG, CHOLHDL, LDLDIRECT in the last 72 hours. Thyroid function studies No results for input(s): TSH, T4TOTAL, T3FREE, THYROIDAB in the last 72 hours.  Invalid input(s): FREET3 Anemia work up No results for input(s): VITAMINB12, FOLATE, FERRITIN, TIBC, IRON, RETICCTPCT in the last 72 hours. Urinalysis    Component Value Date/Time   COLORURINE YELLOW 06/14/2020 1523   APPEARANCEUR CLEAR 06/14/2020 1523   LABSPEC 1.023 06/14/2020 1523   PHURINE 6.0 06/14/2020 1523   GLUCOSEU NEGATIVE 06/14/2020 1523   HGBUR NEGATIVE 06/14/2020 1523   BILIRUBINUR NEGATIVE 06/14/2020 1523   KETONESUR NEGATIVE 06/14/2020 1523   PROTEINUR 100 (A) 06/14/2020 1523   UROBILINOGEN 1.0 08/06/2011 1157   NITRITE NEGATIVE 06/14/2020 1523   LEUKOCYTESUR TRACE (A) 06/14/2020 1523   Sepsis Labs Invalid input(s): PROCALCITONIN,  WBC,  LACTICIDVEN Microbiology Recent Results (from the past 240 hour(s))  Aerobic/Anaerobic Culture w Gram Stain (surgical/deep wound)     Status: None (Preliminary result)   Collection Time: 06/18/20  3:49 AM   Specimen: Neck; Wound  Result Value Ref Range Status   Specimen Description   Final    NECK Performed at [facility], [address], [place], [place]    Special Requests   Final    Normal Performed at [facility], [address] [place], [place]    Gram Stain   Final    ABUNDANT  WBC PRESENT,BOTH PMN AND MONONUCLEAR MODERATE GRAM POSITIVE COCCI IN PAIRS IN CLUSTERS  Culture   Final    MODERATE STAPHYLOCOCCUS AUREUS SUSCEPTIBILITIES TO FOLLOW Performed at [facility], [address] [place], [place] [address]    Report Status PENDING  Incomplete     Time coordinating discharge: 35 minutes  SIGNED:   Glade Lloyd, MD  [facility] 06/19/2020, 1:04 PM

## 2020-06-19 NOTE — TOC Progression Note (Signed)
Transition of Care [facility]) - Progression Note    Patient Details  Name: Mark Russell MRN: [id number] Date of Birth: 15-Dec-1935  Transition of Care [facility]) CM/SW Contact  Geni Bers, RN Phone Number: 06/19/2020, 4:17 PM  Clinical Narrative:     Spoke with pt concerning SNF vs HH. Pt states that he want to go home tomorrow. Pt's son states that pt is home alone during the day for 8 hours and need to be able to transfer him to the [place]. Asked son Mark Russell if he could hire a PCS. Mark Russell states that no one will come into the home because of COVID. Will need PT to eval again.         Expected Discharge Plan and Services           Expected Discharge Date: 06/19/20                                     Social Determinants of Health (SDOH) Interventions    Readmission Risk Interventions No flowsheet data found.

## 2020-06-19 NOTE — Progress Notes (Signed)
ENT follow-up  He feels much better today compared to 2 days ago.  On exam the tenderness is significantly reduced.  There is multiple large surface pustules but no obvious drainable abscess.  This has the appearance of a MRSA type infection which typically does not need to be drained unless there is a large deep abscess which there does not appear to be.  The culture is pending but appears to be consistent with a Staphylococcus.  Sensitivities are pending.  This is consistent with a MRSA type infection.  As soon as we know the results of the sensitivities and he can be placed on either at home IV antibiotics or oral antibiotics then discharge should be okay.  I can have him follow-up with me as an outpatient.

## 2020-06-19 NOTE — Progress Notes (Signed)
ANTICOAGULATION CONSULT NOTE  Pharmacy Consult for warfarin Indication: atrial fibrillation and history of VTE  Allergies  Allergen Reactions   Ace Inhibitors     hypotension   Dilaudid [Hydromorphone Hcl]     "Mean"   Metformin And Related     GI upset   Morphine And Related Other (See Comments)    "mean"   Pletal [Cilostazol]     SOB    Patient Measurements: Height: 4' (121.9 cm) Weight: 81.8 kg (180 lb 5.4 oz) IBW/kg (Calculated) : 22.4  Vital Signs: Temp: 98.4 F (36.9 C) (06/15 0615) Temp Source: Oral (06/15 0615) BP: 160/79 (06/15 0615) Pulse Rate: 62 (06/15 0615)  Labs: Recent Labs    06/17/20 0705 06/18/20 0348 06/19/20 0859  HGB 12.9* 13.5 14.5  HCT 41.8 43.0 47.0  PLT 477* 481* 546*  LABPROT 29.4* 23.2* 25.5*  INR 2.8* 2.1* 2.3*  CREATININE 0.69 0.71 0.76     Estimated Creatinine Clearance: 45.7 mL/min (by C-G formula based on SCr of 0.76 mg/dL).  PTA Warfarin:  Home dose confirmed with patients son: Warfarin 2mg  (1/2 tablet) MWF, 4 mg (1 tablet) all other days of the week Last dose: 6/9 PM  Current Medications:   amLODipine  10 mg Oral Daily   donepezil  10 mg Oral QHS   DULoxetine  30 mg Oral Daily   fentaNYL  1 patch Transdermal Q72H   FLUoxetine  40 mg Oral Daily   gabapentin  900 mg Oral TID   guaiFENesin  600 mg Oral BID   insulin aspart  0-15 Units Subcutaneous TID WC   insulin aspart  0-5 Units Subcutaneous QHS   metoprolol tartrate  25 mg Oral BID   mometasone-formoterol  2 puff Inhalation BID   simvastatin  40 mg Oral QPM   tamsulosin  0.4 mg Oral Daily   Warfarin - Pharmacist Dosing Inpatient   Does not apply q1600    Assessment: Pt is an 84 yoM with PMH significant for afib and DVT for which is takes warfarin chronically. Recently admitted with COVID PNA, returned to the ED on 6/10 with confusion, weakness, diarrhea. INR 8.9 was SUPRAtherapeutic on admission, no signs of bleeding, given vitamin K 1mg  IV in ED on 6/10. Note  recent admission from 6/2 - 6/6 with supratherapeutic INR on admission (8.1).   Warfarin home dosage prior to admission: 4mg  daily except 2mg  on MWF  Pharmacy consulted to dose warfarin while inpatient, with enoxaparin bridge while INR subtherapeutic given history of VTE.   Today, 06/19/20 INR still therapeutic but did decrease from 2.8 to 2.1, has gotten warfarin doses of 4mg , 2mg , 0mg  and 2.5mg   Warfarin held 6/10, Vit K 1mg  IV  CBC: stable No bleeding or complications noted. Diet: Carb modified, 80-100% of meals charted Drug interactions: doxycycline  No documented bowel movements per RN charting yesterday   Goal of Therapy:  INR 2-3 Monitor platelets by anticoagulation protocol: Yes   Plan:  With patient eating well and diarrhea resolved, will attempt to re-initiate patient's home warfarin regimen as above - 2mg  today PT/INR daily CBC at least q72h Monitor closely for s/sx of bleeding   07-30-2002, PharmD, BCPS Secure Chat if ?s 06/19/2020 10:38 AM

## 2020-06-20 LAB — CBC WITH DIFFERENTIAL/PLATELET
Abs Immature Granulocytes: 0.2 10*3/uL — ABNORMAL HIGH (ref 0.00–0.07)
Basophils Absolute: 0.1 10*3/uL (ref 0.0–0.1)
Basophils Relative: 1 %
Eosinophils Absolute: 0.1 10*3/uL (ref 0.0–0.5)
Eosinophils Relative: 1 %
HCT: 42.4 % (ref 39.0–52.0)
Hemoglobin: 13 g/dL (ref 13.0–17.0)
Immature Granulocytes: 2 %
Lymphocytes Relative: 6 %
Lymphs Abs: 0.8 10*3/uL (ref 0.7–4.0)
MCH: 29.1 pg (ref 26.0–34.0)
MCHC: 30.7 g/dL (ref 30.0–36.0)
MCV: 95.1 fL (ref 80.0–100.0)
Monocytes Absolute: 1.4 10*3/uL — ABNORMAL HIGH (ref 0.1–1.0)
Monocytes Relative: 11 %
Neutro Abs: 9.8 10*3/uL — ABNORMAL HIGH (ref 1.7–7.7)
Neutrophils Relative %: 79 %
Platelets: 385 10*3/uL (ref 150–400)
RBC: 4.46 MIL/uL (ref 4.22–5.81)
RDW: 15.4 % (ref 11.5–15.5)
WBC: 12.3 10*3/uL — ABNORMAL HIGH (ref 4.0–10.5)
nRBC: 0 % (ref 0.0–0.2)

## 2020-06-20 LAB — BASIC METABOLIC PANEL
Anion gap: 8 (ref 5–15)
BUN: 17 mg/dL (ref 8–23)
CO2: 30 mmol/L (ref 22–32)
Calcium: 8.3 mg/dL — ABNORMAL LOW (ref 8.9–10.3)
Chloride: 99 mmol/L (ref 98–111)
Creatinine, Ser: 0.72 mg/dL (ref 0.61–1.24)
GFR, Estimated: >60 mL/min (ref >60)
Glucose, Bld: 107 mg/dL — ABNORMAL HIGH (ref 70–99)
Potassium: 3.6 mmol/L (ref 3.5–5.1)
Sodium: 137 mmol/L (ref 135–145)

## 2020-06-20 LAB — GLUCOSE, CAPILLARY
Glucose-Capillary: 126 mg/dL — ABNORMAL HIGH (ref 70–99)
Glucose-Capillary: 89 mg/dL (ref 70–99)

## 2020-06-20 LAB — PROTIME-INR
INR: 2.2 — ABNORMAL HIGH (ref 0.8–1.2)
Prothrombin Time: 24.5 seconds — ABNORMAL HIGH (ref 11.4–15.2)

## 2020-06-20 LAB — MAGNESIUM: Magnesium: 2 mg/dL (ref 1.7–2.4)

## 2020-06-20 NOTE — Plan of Care (Signed)
  Problem: Education: Goal: Knowledge of General Education information will improve Description: Including pain rating scale, medication(s)/side effects and non-pharmacologic comfort measures Outcome: Adequate for Discharge   Problem: Clinical Measurements: Goal: Respiratory complications will improve Outcome: Adequate for Discharge Goal: Cardiovascular complication will be avoided Outcome: Adequate for Discharge   Problem: Activity: Goal: Risk for activity intolerance will decrease Outcome: Adequate for Discharge

## 2020-06-20 NOTE — Progress Notes (Signed)
Patient ID: Mark Russell, male   DOB: 1935/09/26, 85 y.o.   MRN: [id number] Patient was supposed to be discharged home on 06/19/2020 but discharge was held because of possibility of unsafe discharge as son stated that patient was not ready physically to go home.  PT will evaluate the patient today.  Hopefully patient can be discharged home afterwards.  Patient is medically stable for discharge.  Patient seen and examined at bedside.  Please refer to the full discharge summary done by me on 06/19/2020 for full details.

## 2020-06-20 NOTE — Progress Notes (Signed)
Physical Therapy Treatment Patient Details Name: Mark Russell MRN: [id number] DOB: 12-05-1935 Today's Date: 06/20/2020    History of Present Illness Mark Russell is a 85 y.o. male  Presenting with weakness and confusion..  Dx of cellulitis posterior to R ear. Pt with medical history significant of PVD, DVT on anticoagulation, a fib, HTN, DM2, recent COVID PNA w/ hypoxia, h/o bil. AKA. He was recently d/c from [facility] on 06/10/20 for covid admission    PT Comments    No assistance needed for lateral scoot from bed to recliner. From a PT standpoint, pt is ready to DC home as he is at baseline (independent transfers into wheelchair) with mobility. PT goals have been met, will sign off.    Follow Up Recommendations  Supervision for mobility/OOB;No PT follow up     Equipment Recommendations  None recommended by PT    Recommendations for Other Services       Precautions / Restrictions Precautions Precautions: Fall Precaution Comments: h/o Bil AKA Restrictions Weight Bearing Restrictions: No    Mobility  Bed Mobility Overal bed mobility: Modified Independent       Supine to sit: HOB elevated;Modified independent (Device/Increase time)     General bed mobility comments: with use of bed rails, no physical assist required, HOB up ~45*, pt transitioned from supine to long sitting.    Transfers   Equipment used: None Transfers: Lateral/Scoot Transfers          Lateral/Scoot Transfers: Independent    Ambulation/Gait             General Gait Details: nonambulatory   Stairs             Wheelchair Mobility    Modified Rankin (Stroke Patients Only)       Balance Overall balance assessment: Needs assistance Sitting-balance support: No upper extremity supported Sitting balance-Leahy Scale: Fair                                      Cognition Arousal/Alertness: Awake/alert Behavior During Therapy: WFL for tasks assessed/performed Overall  Cognitive Status: Within Functional Limits for tasks assessed                                        Exercises      General Comments        Pertinent Vitals/Pain Pain Assessment: No/denies pain    Home Living                      Prior Function            PT Goals (current goals can now be found in the care plan section) Acute Rehab PT Goals Patient Stated Goal: pt wants to go home to his dog PT Goal Formulation: With patient Time For Goal Achievement: 06/29/20 Potential to Achieve Goals: Good Progress towards PT goals: Goals met/education completed, patient discharged from PT    Frequency    Min 3X/week      PT Plan Current plan remains appropriate    Co-evaluation              AM-PAC PT "6 Clicks" Mobility   Outcome Measure  Help needed turning from your back to your side while in a flat bed without using bedrails?: A Little Help needed moving from  lying on your back to sitting on the side of a flat bed without using bedrails?: A Little Help needed moving to and from a bed to a chair (including a wheelchair)?: None Help needed standing up from a chair using your arms (e.g., wheelchair or bedside chair)?: Total Help needed to walk in hospital room?: Total Help needed climbing 3-5 steps with a railing? : Total 6 Click Score: 13    End of Session   Activity Tolerance: Patient tolerated treatment well Patient left: in chair;with call bell/phone within reach Nurse Communication: Mobility status PT Visit Diagnosis: Other abnormalities of gait and mobility (R26.89);Muscle weakness (generalized) (M62.81)     Time: 9611-6435 PT Time Calculation (min) (ACUTE ONLY): 11 min  Charges:  $Therapeutic Activity: 8-22 mins                    Blondell Reveal Kistler PT 06/20/2020  Acute Rehabilitation Services Pager [phone] Office [phone]

## 2020-06-20 NOTE — TOC Progression Note (Signed)
Transition of Care [facility]) - Progression Note    Patient Details  Name: Mark Russell MRN: [id number] Date of Birth: 06/20/1935  [facility] CM/SW Contact  Geni Bers, RN Phone Number: 06/20/2020, 10:53 AM  Clinical Narrative:     Spoke with pt's son this AM and explained PT worked with pt and outcome. Mark Russell pt's son understands that pt is ready for discharge. Asked RN to call Mark Russell when pt is ready to leave. [facility] is [facility] for [facility].        Expected Discharge Plan and Services           Expected Discharge Date: 06/19/20                                     Social Determinants of Health (SDOH) Interventions    Readmission Risk Interventions No flowsheet data found.

## 2020-06-21 DIAGNOSIS — Z86718 Personal history of other venous thrombosis and embolism: Secondary | ICD-10-CM | POA: Diagnosis not present

## 2020-06-21 DIAGNOSIS — M6281 Muscle weakness (generalized): Secondary | ICD-10-CM | POA: Diagnosis not present

## 2020-06-21 DIAGNOSIS — I10 Essential (primary) hypertension: Secondary | ICD-10-CM | POA: Diagnosis not present

## 2020-06-21 DIAGNOSIS — G9341 Metabolic encephalopathy: Secondary | ICD-10-CM | POA: Diagnosis not present

## 2020-06-21 DIAGNOSIS — J1282 Pneumonia due to coronavirus disease 2019: Secondary | ICD-10-CM | POA: Diagnosis not present

## 2020-06-21 DIAGNOSIS — I480 Paroxysmal atrial fibrillation: Secondary | ICD-10-CM | POA: Diagnosis not present

## 2020-06-21 DIAGNOSIS — E1151 Type 2 diabetes mellitus with diabetic peripheral angiopathy without gangrene: Secondary | ICD-10-CM | POA: Diagnosis not present

## 2020-06-21 DIAGNOSIS — U071 COVID-19: Secondary | ICD-10-CM | POA: Diagnosis not present

## 2020-06-21 DIAGNOSIS — J9600 Acute respiratory failure, unspecified whether with hypoxia or hypercapnia: Secondary | ICD-10-CM | POA: Diagnosis not present

## 2020-06-21 DIAGNOSIS — J441 Chronic obstructive pulmonary disease with (acute) exacerbation: Secondary | ICD-10-CM | POA: Diagnosis not present

## 2020-06-23 LAB — AEROBIC/ANAEROBIC CULTURE W GRAM STAIN (SURGICAL/DEEP WOUND): Special Requests: NORMAL

## 2020-06-25 ENCOUNTER — Telehealth: Payer: Self-pay

## 2020-06-25 DIAGNOSIS — J441 Chronic obstructive pulmonary disease with (acute) exacerbation: Secondary | ICD-10-CM | POA: Diagnosis not present

## 2020-06-25 DIAGNOSIS — Z86718 Personal history of other venous thrombosis and embolism: Secondary | ICD-10-CM | POA: Diagnosis not present

## 2020-06-25 DIAGNOSIS — I480 Paroxysmal atrial fibrillation: Secondary | ICD-10-CM | POA: Diagnosis not present

## 2020-06-25 DIAGNOSIS — J9600 Acute respiratory failure, unspecified whether with hypoxia or hypercapnia: Secondary | ICD-10-CM | POA: Diagnosis not present

## 2020-06-25 DIAGNOSIS — E1151 Type 2 diabetes mellitus with diabetic peripheral angiopathy without gangrene: Secondary | ICD-10-CM | POA: Diagnosis not present

## 2020-06-25 DIAGNOSIS — G9341 Metabolic encephalopathy: Secondary | ICD-10-CM | POA: Diagnosis not present

## 2020-06-25 DIAGNOSIS — U071 COVID-19: Secondary | ICD-10-CM | POA: Diagnosis not present

## 2020-06-25 DIAGNOSIS — M6281 Muscle weakness (generalized): Secondary | ICD-10-CM | POA: Diagnosis not present

## 2020-06-25 DIAGNOSIS — I10 Essential (primary) hypertension: Secondary | ICD-10-CM | POA: Diagnosis not present

## 2020-06-25 DIAGNOSIS — J1282 Pneumonia due to coronavirus disease 2019: Secondary | ICD-10-CM | POA: Diagnosis not present

## 2020-06-25 NOTE — Telephone Encounter (Signed)
Spoke with patient's son Casimiro Needle and scheduled an in-person Palliative Consult for 07/24/20 @ 9AM.   COVID positive 3 weeks ago. (6/1)  No pets in home. Patient lives with son.  Consent obtained; updated Outlook/Netsmart/Team List and Epic.   Family is aware they may be receiving a call from NP the day before or day of to confirm appointment.

## 2020-06-26 DIAGNOSIS — J9600 Acute respiratory failure, unspecified whether with hypoxia or hypercapnia: Secondary | ICD-10-CM | POA: Diagnosis not present

## 2020-06-26 DIAGNOSIS — L0291 Cutaneous abscess, unspecified: Secondary | ICD-10-CM | POA: Diagnosis not present

## 2020-06-26 DIAGNOSIS — R918 Other nonspecific abnormal finding of lung field: Secondary | ICD-10-CM | POA: Diagnosis not present

## 2020-06-26 DIAGNOSIS — E1151 Type 2 diabetes mellitus with diabetic peripheral angiopathy without gangrene: Secondary | ICD-10-CM | POA: Diagnosis not present

## 2020-06-26 DIAGNOSIS — E1152 Type 2 diabetes mellitus with diabetic peripheral angiopathy with gangrene: Secondary | ICD-10-CM | POA: Diagnosis not present

## 2020-06-26 DIAGNOSIS — Z86718 Personal history of other venous thrombosis and embolism: Secondary | ICD-10-CM | POA: Diagnosis not present

## 2020-06-26 DIAGNOSIS — N181 Chronic kidney disease, stage 1: Secondary | ICD-10-CM | POA: Diagnosis not present

## 2020-06-26 DIAGNOSIS — Z8616 Personal history of COVID-19: Secondary | ICD-10-CM | POA: Diagnosis not present

## 2020-06-26 DIAGNOSIS — I10 Essential (primary) hypertension: Secondary | ICD-10-CM | POA: Diagnosis not present

## 2020-06-26 DIAGNOSIS — G9341 Metabolic encephalopathy: Secondary | ICD-10-CM | POA: Diagnosis not present

## 2020-06-26 DIAGNOSIS — U071 COVID-19: Secondary | ICD-10-CM | POA: Diagnosis not present

## 2020-06-26 DIAGNOSIS — H6011 Cellulitis of right external ear: Secondary | ICD-10-CM | POA: Diagnosis not present

## 2020-06-26 DIAGNOSIS — Z7901 Long term (current) use of anticoagulants: Secondary | ICD-10-CM | POA: Diagnosis not present

## 2020-06-26 DIAGNOSIS — Z09 Encounter for follow-up examination after completed treatment for conditions other than malignant neoplasm: Secondary | ICD-10-CM | POA: Diagnosis not present

## 2020-06-26 DIAGNOSIS — I129 Hypertensive chronic kidney disease with stage 1 through stage 4 chronic kidney disease, or unspecified chronic kidney disease: Secondary | ICD-10-CM | POA: Diagnosis not present

## 2020-06-26 DIAGNOSIS — J1282 Pneumonia due to coronavirus disease 2019: Secondary | ICD-10-CM | POA: Diagnosis not present

## 2020-06-26 DIAGNOSIS — J441 Chronic obstructive pulmonary disease with (acute) exacerbation: Secondary | ICD-10-CM | POA: Diagnosis not present

## 2020-06-26 DIAGNOSIS — I480 Paroxysmal atrial fibrillation: Secondary | ICD-10-CM | POA: Diagnosis not present

## 2020-06-26 DIAGNOSIS — M6281 Muscle weakness (generalized): Secondary | ICD-10-CM | POA: Diagnosis not present

## 2020-06-27 DIAGNOSIS — A4902 Methicillin resistant Staphylococcus aureus infection, unspecified site: Secondary | ICD-10-CM | POA: Diagnosis not present

## 2020-06-27 DIAGNOSIS — Z7901 Long term (current) use of anticoagulants: Secondary | ICD-10-CM | POA: Diagnosis not present

## 2020-06-27 DIAGNOSIS — L0291 Cutaneous abscess, unspecified: Secondary | ICD-10-CM | POA: Diagnosis not present

## 2020-07-01 DIAGNOSIS — Z7901 Long term (current) use of anticoagulants: Secondary | ICD-10-CM | POA: Diagnosis not present

## 2020-07-02 DIAGNOSIS — G4700 Insomnia, unspecified: Secondary | ICD-10-CM | POA: Diagnosis not present

## 2020-07-02 DIAGNOSIS — G894 Chronic pain syndrome: Secondary | ICD-10-CM | POA: Diagnosis not present

## 2020-07-02 DIAGNOSIS — G546 Phantom limb syndrome with pain: Secondary | ICD-10-CM | POA: Diagnosis not present

## 2020-07-04 DIAGNOSIS — R531 Weakness: Secondary | ICD-10-CM | POA: Diagnosis not present

## 2020-07-04 DIAGNOSIS — R001 Bradycardia, unspecified: Secondary | ICD-10-CM | POA: Diagnosis not present

## 2020-07-04 DIAGNOSIS — R195 Other fecal abnormalities: Secondary | ICD-10-CM | POA: Diagnosis not present

## 2020-07-05 ENCOUNTER — Telehealth: Payer: Self-pay | Admitting: Student

## 2020-07-05 NOTE — Telephone Encounter (Signed)
Spoke with patient's son Casimiro Needle, and have rescheduled the Palliative Care consult with Dr. Bufford Spikes, documentation will be noted in [facility]'s EMR Netsmart

## 2020-07-24 ENCOUNTER — Other Ambulatory Visit: Payer: Medicare Other | Admitting: Student

## 2020-07-31 DIAGNOSIS — I480 Paroxysmal atrial fibrillation: Secondary | ICD-10-CM | POA: Diagnosis not present

## 2020-07-31 DIAGNOSIS — G894 Chronic pain syndrome: Secondary | ICD-10-CM | POA: Diagnosis not present

## 2020-07-31 DIAGNOSIS — R197 Diarrhea, unspecified: Secondary | ICD-10-CM | POA: Diagnosis not present

## 2020-07-31 DIAGNOSIS — D6869 Other thrombophilia: Secondary | ICD-10-CM | POA: Diagnosis not present

## 2020-07-31 DIAGNOSIS — N181 Chronic kidney disease, stage 1: Secondary | ICD-10-CM | POA: Diagnosis not present

## 2020-07-31 DIAGNOSIS — G4700 Insomnia, unspecified: Secondary | ICD-10-CM | POA: Diagnosis not present

## 2020-07-31 DIAGNOSIS — G546 Phantom limb syndrome with pain: Secondary | ICD-10-CM | POA: Diagnosis not present

## 2020-07-31 DIAGNOSIS — U071 COVID-19: Secondary | ICD-10-CM | POA: Diagnosis not present

## 2020-07-31 DIAGNOSIS — I129 Hypertensive chronic kidney disease with stage 1 through stage 4 chronic kidney disease, or unspecified chronic kidney disease: Secondary | ICD-10-CM | POA: Diagnosis not present

## 2020-07-31 DIAGNOSIS — E1121 Type 2 diabetes mellitus with diabetic nephropathy: Secondary | ICD-10-CM | POA: Diagnosis not present

## 2020-08-16 DIAGNOSIS — Z7901 Long term (current) use of anticoagulants: Secondary | ICD-10-CM | POA: Diagnosis not present

## 2020-08-29 ENCOUNTER — Telehealth: Payer: Self-pay

## 2020-08-29 NOTE — Telephone Encounter (Signed)
Spoke with patient's daughter Asher Muir regarding scheduling a PC consult. She states they don't need PC they only need someone to come out every 2 weeks to do a PT/INR check. Advised [facility] does not offer that service. She states she will call Dr. Jone Baseman office back to request a different referral for in home labs only.

## 2020-08-30 DIAGNOSIS — G894 Chronic pain syndrome: Secondary | ICD-10-CM | POA: Diagnosis not present

## 2020-08-30 DIAGNOSIS — G546 Phantom limb syndrome with pain: Secondary | ICD-10-CM | POA: Diagnosis not present

## 2020-08-30 DIAGNOSIS — G4700 Insomnia, unspecified: Secondary | ICD-10-CM | POA: Diagnosis not present

## 2020-09-09 ENCOUNTER — Inpatient Hospital Stay (HOSPITAL_COMMUNITY)
Admission: EM | Admit: 2020-09-09 | Discharge: 2020-09-13 | DRG: 871 | Disposition: A | Discharge status: Home Health | Payer: Medicare Other | Attending: Internal Medicine | Admitting: Internal Medicine

## 2020-09-09 ENCOUNTER — Emergency Department (HOSPITAL_COMMUNITY): Payer: Medicare Other

## 2020-09-09 ENCOUNTER — Other Ambulatory Visit: Payer: Self-pay

## 2020-09-09 ENCOUNTER — Encounter (HOSPITAL_COMMUNITY): Payer: Self-pay | Admitting: Family Medicine

## 2020-09-09 DIAGNOSIS — Z6841 Body Mass Index (BMI) 40.0 and over, adult: Secondary | ICD-10-CM | POA: Diagnosis not present

## 2020-09-09 DIAGNOSIS — K219 Gastro-esophageal reflux disease without esophagitis: Secondary | ICD-10-CM | POA: Diagnosis present

## 2020-09-09 DIAGNOSIS — I119 Hypertensive heart disease without heart failure: Secondary | ICD-10-CM | POA: Diagnosis not present

## 2020-09-09 DIAGNOSIS — Z87891 Personal history of nicotine dependence: Secondary | ICD-10-CM

## 2020-09-09 DIAGNOSIS — J9600 Acute respiratory failure, unspecified whether with hypoxia or hypercapnia: Secondary | ICD-10-CM | POA: Diagnosis present

## 2020-09-09 DIAGNOSIS — E1140 Type 2 diabetes mellitus with diabetic neuropathy, unspecified: Secondary | ICD-10-CM | POA: Diagnosis present

## 2020-09-09 DIAGNOSIS — Z89512 Acquired absence of left leg below knee: Secondary | ICD-10-CM

## 2020-09-09 DIAGNOSIS — E1151 Type 2 diabetes mellitus with diabetic peripheral angiopathy without gangrene: Secondary | ICD-10-CM | POA: Diagnosis present

## 2020-09-09 DIAGNOSIS — E785 Hyperlipidemia, unspecified: Secondary | ICD-10-CM | POA: Diagnosis present

## 2020-09-09 DIAGNOSIS — Z20822 Contact with and (suspected) exposure to covid-19: Secondary | ICD-10-CM | POA: Diagnosis present

## 2020-09-09 DIAGNOSIS — Z86718 Personal history of other venous thrombosis and embolism: Secondary | ICD-10-CM

## 2020-09-09 DIAGNOSIS — J440 Chronic obstructive pulmonary disease with acute lower respiratory infection: Secondary | ICD-10-CM | POA: Diagnosis present

## 2020-09-09 DIAGNOSIS — E538 Deficiency of other specified B group vitamins: Secondary | ICD-10-CM | POA: Diagnosis not present

## 2020-09-09 DIAGNOSIS — M25512 Pain in left shoulder: Secondary | ICD-10-CM | POA: Diagnosis not present

## 2020-09-09 DIAGNOSIS — G928 Other toxic encephalopathy: Secondary | ICD-10-CM | POA: Diagnosis present

## 2020-09-09 DIAGNOSIS — N400 Enlarged prostate without lower urinary tract symptoms: Secondary | ICD-10-CM | POA: Diagnosis present

## 2020-09-09 DIAGNOSIS — A419 Sepsis, unspecified organism: Secondary | ICD-10-CM | POA: Diagnosis not present

## 2020-09-09 DIAGNOSIS — Z7901 Long term (current) use of anticoagulants: Secondary | ICD-10-CM

## 2020-09-09 DIAGNOSIS — M11212 Other chondrocalcinosis, left shoulder: Secondary | ICD-10-CM | POA: Diagnosis not present

## 2020-09-09 DIAGNOSIS — Z7951 Long term (current) use of inhaled steroids: Secondary | ICD-10-CM

## 2020-09-09 DIAGNOSIS — J9601 Acute respiratory failure with hypoxia: Secondary | ICD-10-CM | POA: Diagnosis present

## 2020-09-09 DIAGNOSIS — Z800 Family history of malignant neoplasm of digestive organs: Secondary | ICD-10-CM | POA: Diagnosis not present

## 2020-09-09 DIAGNOSIS — I480 Paroxysmal atrial fibrillation: Secondary | ICD-10-CM | POA: Diagnosis present

## 2020-09-09 DIAGNOSIS — I70209 Unspecified atherosclerosis of native arteries of extremities, unspecified extremity: Secondary | ICD-10-CM | POA: Diagnosis not present

## 2020-09-09 DIAGNOSIS — Z8614 Personal history of Methicillin resistant Staphylococcus aureus infection: Secondary | ICD-10-CM | POA: Diagnosis not present

## 2020-09-09 DIAGNOSIS — Z8679 Personal history of other diseases of the circulatory system: Secondary | ICD-10-CM | POA: Diagnosis not present

## 2020-09-09 DIAGNOSIS — Z885 Allergy status to narcotic agent status: Secondary | ICD-10-CM

## 2020-09-09 DIAGNOSIS — I4891 Unspecified atrial fibrillation: Secondary | ICD-10-CM | POA: Diagnosis not present

## 2020-09-09 DIAGNOSIS — F419 Anxiety disorder, unspecified: Secondary | ICD-10-CM | POA: Diagnosis present

## 2020-09-09 DIAGNOSIS — R0602 Shortness of breath: Secondary | ICD-10-CM | POA: Diagnosis not present

## 2020-09-09 DIAGNOSIS — M19012 Primary osteoarthritis, left shoulder: Secondary | ICD-10-CM | POA: Diagnosis present

## 2020-09-09 DIAGNOSIS — I248 Other forms of acute ischemic heart disease: Secondary | ICD-10-CM | POA: Diagnosis present

## 2020-09-09 DIAGNOSIS — I82409 Acute embolism and thrombosis of unspecified deep veins of unspecified lower extremity: Secondary | ICD-10-CM | POA: Diagnosis present

## 2020-09-09 DIAGNOSIS — E876 Hypokalemia: Secondary | ICD-10-CM | POA: Diagnosis present

## 2020-09-09 DIAGNOSIS — M10012 Idiopathic gout, left shoulder: Secondary | ICD-10-CM | POA: Diagnosis not present

## 2020-09-09 DIAGNOSIS — M25412 Effusion, left shoulder: Secondary | ICD-10-CM | POA: Diagnosis not present

## 2020-09-09 DIAGNOSIS — Z8249 Family history of ischemic heart disease and other diseases of the circulatory system: Secondary | ICD-10-CM | POA: Diagnosis not present

## 2020-09-09 DIAGNOSIS — G4733 Obstructive sleep apnea (adult) (pediatric): Secondary | ICD-10-CM | POA: Diagnosis present

## 2020-09-09 DIAGNOSIS — Z888 Allergy status to other drugs, medicaments and biological substances status: Secondary | ICD-10-CM

## 2020-09-09 DIAGNOSIS — R059 Cough, unspecified: Secondary | ICD-10-CM | POA: Diagnosis not present

## 2020-09-09 DIAGNOSIS — G8929 Other chronic pain: Secondary | ICD-10-CM | POA: Diagnosis not present

## 2020-09-09 DIAGNOSIS — Z89511 Acquired absence of right leg below knee: Secondary | ICD-10-CM

## 2020-09-09 DIAGNOSIS — Z743 Need for continuous supervision: Secondary | ICD-10-CM | POA: Diagnosis not present

## 2020-09-09 DIAGNOSIS — J189 Pneumonia, unspecified organism: Secondary | ICD-10-CM | POA: Diagnosis present

## 2020-09-09 DIAGNOSIS — I739 Peripheral vascular disease, unspecified: Secondary | ICD-10-CM | POA: Diagnosis present

## 2020-09-09 DIAGNOSIS — I499 Cardiac arrhythmia, unspecified: Secondary | ICD-10-CM | POA: Diagnosis not present

## 2020-09-09 DIAGNOSIS — J449 Chronic obstructive pulmonary disease, unspecified: Secondary | ICD-10-CM | POA: Diagnosis present

## 2020-09-09 DIAGNOSIS — R0902 Hypoxemia: Secondary | ICD-10-CM | POA: Diagnosis not present

## 2020-09-09 DIAGNOSIS — R6889 Other general symptoms and signs: Secondary | ICD-10-CM | POA: Diagnosis not present

## 2020-09-09 DIAGNOSIS — I10 Essential (primary) hypertension: Secondary | ICD-10-CM | POA: Diagnosis present

## 2020-09-09 DIAGNOSIS — I517 Cardiomegaly: Secondary | ICD-10-CM | POA: Diagnosis not present

## 2020-09-09 DIAGNOSIS — R4781 Slurred speech: Secondary | ICD-10-CM | POA: Diagnosis not present

## 2020-09-09 DIAGNOSIS — Z79899 Other long term (current) drug therapy: Secondary | ICD-10-CM

## 2020-09-09 LAB — CBC WITH DIFFERENTIAL/PLATELET
Abs Immature Granulocytes: 0.13 10*3/uL — ABNORMAL HIGH (ref 0.00–0.07)
Basophils Absolute: 0.1 10*3/uL (ref 0.0–0.1)
Basophils Relative: 0 %
Eosinophils Absolute: 0 10*3/uL (ref 0.0–0.5)
Eosinophils Relative: 0 %
HCT: 41 % (ref 39.0–52.0)
Hemoglobin: 12.6 g/dL — ABNORMAL LOW (ref 13.0–17.0)
Immature Granulocytes: 1 %
Lymphocytes Relative: 3 %
Lymphs Abs: 0.5 10*3/uL — ABNORMAL LOW (ref 0.7–4.0)
MCH: 29.9 pg (ref 26.0–34.0)
MCHC: 30.7 g/dL (ref 30.0–36.0)
MCV: 97.4 fL (ref 80.0–100.0)
Monocytes Absolute: 1.6 10*3/uL — ABNORMAL HIGH (ref 0.1–1.0)
Monocytes Relative: 9 %
Neutro Abs: 16.3 10*3/uL — ABNORMAL HIGH (ref 1.7–7.7)
Neutrophils Relative %: 87 %
Platelets: 346 10*3/uL (ref 150–400)
RBC: 4.21 MIL/uL — ABNORMAL LOW (ref 4.22–5.81)
RDW: 15 % (ref 11.5–15.5)
WBC: 18.6 10*3/uL — ABNORMAL HIGH (ref 4.0–10.5)
nRBC: 0 % (ref 0.0–0.2)

## 2020-09-09 LAB — SYNOVIAL CELL COUNT + DIFF, W/ CRYSTALS
Eosinophils-Synovial: 0 % (ref 0–1)
Lymphocytes-Synovial Fld: 0 % (ref 0–20)
Monocyte-Macrophage-Synovial Fluid: 14 % — ABNORMAL LOW (ref 50–90)
Neutrophil, Synovial: 86 % — ABNORMAL HIGH (ref 0–25)
WBC, Synovial: 12000 /mm3 — ABNORMAL HIGH (ref 0–200)

## 2020-09-09 LAB — LACTIC ACID, PLASMA: Lactic Acid, Venous: 1.5 mmol/L (ref 0.5–1.9)

## 2020-09-09 LAB — D-DIMER, QUANTITATIVE: D-Dimer, Quant: 1.87 ug/mL-FEU — ABNORMAL HIGH (ref 0.00–0.50)

## 2020-09-09 LAB — URINALYSIS, ROUTINE W REFLEX MICROSCOPIC
Bacteria, UA: NONE SEEN
Bilirubin Urine: NEGATIVE
Glucose, UA: NEGATIVE mg/dL
Hgb urine dipstick: NEGATIVE
Ketones, ur: NEGATIVE mg/dL
Leukocytes,Ua: NEGATIVE
Nitrite: NEGATIVE
Protein, ur: 100 mg/dL — AB
Specific Gravity, Urine: 1.023 (ref 1.005–1.030)
pH: 5 (ref 5.0–8.0)

## 2020-09-09 LAB — COMPREHENSIVE METABOLIC PANEL
ALT: 11 U/L (ref 0–44)
AST: 14 U/L — ABNORMAL LOW (ref 15–41)
Albumin: 2.8 g/dL — ABNORMAL LOW (ref 3.5–5.0)
Alkaline Phosphatase: 48 U/L (ref 38–126)
Anion gap: 11 (ref 5–15)
BUN: 7 mg/dL — ABNORMAL LOW (ref 8–23)
CO2: 30 mmol/L (ref 22–32)
Calcium: 8.6 mg/dL — ABNORMAL LOW (ref 8.9–10.3)
Chloride: 97 mmol/L — ABNORMAL LOW (ref 98–111)
Creatinine, Ser: 0.77 mg/dL (ref 0.61–1.24)
GFR, Estimated: >60 mL/min (ref >60)
Glucose, Bld: 139 mg/dL — ABNORMAL HIGH (ref 70–99)
Potassium: 3.1 mmol/L — ABNORMAL LOW (ref 3.5–5.1)
Sodium: 138 mmol/L (ref 135–145)
Total Bilirubin: 1.2 mg/dL (ref 0.3–1.2)
Total Protein: 6.8 g/dL (ref 6.5–8.1)

## 2020-09-09 LAB — RESP PANEL BY RT-PCR (FLU A&B, COVID) ARPGX2
Influenza A by PCR: NEGATIVE
Influenza B by PCR: NEGATIVE
SARS Coronavirus 2 by RT PCR: NEGATIVE

## 2020-09-09 LAB — TROPONIN I (HIGH SENSITIVITY)
Troponin I (High Sensitivity): 41 ng/L — ABNORMAL HIGH (ref <18)
Troponin I (High Sensitivity): 44 ng/L — ABNORMAL HIGH (ref <18)

## 2020-09-09 LAB — SEDIMENTATION RATE: Sed Rate: 55 mm/hr — ABNORMAL HIGH (ref 0–16)

## 2020-09-09 LAB — TSH: TSH: 0.169 u[IU]/mL — ABNORMAL LOW (ref 0.350–4.500)

## 2020-09-09 LAB — PROTIME-INR
INR: 1.3 — ABNORMAL HIGH (ref 0.8–1.2)
Prothrombin Time: 16.2 seconds — ABNORMAL HIGH (ref 11.4–15.2)

## 2020-09-09 LAB — VITAMIN B12: Vitamin B-12: 121 pg/mL — ABNORMAL LOW (ref 180–914)

## 2020-09-09 LAB — GLUCOSE, CAPILLARY: Glucose-Capillary: 106 mg/dL — ABNORMAL HIGH (ref 70–99)

## 2020-09-09 LAB — C-REACTIVE PROTEIN: CRP: 26 mg/dL — ABNORMAL HIGH (ref <1.0)

## 2020-09-09 LAB — BRAIN NATRIURETIC PEPTIDE: B Natriuretic Peptide: 224.5 pg/mL — ABNORMAL HIGH (ref 0.0–100.0)

## 2020-09-09 LAB — MAGNESIUM: Magnesium: 2 mg/dL (ref 1.7–2.4)

## 2020-09-09 MED ORDER — VANCOMYCIN HCL 1750 MG/350ML IV SOLN
1750.0000 mg | Freq: Once | INTRAVENOUS | Status: AC
  Administered 2020-09-09: 1750 mg via INTRAVENOUS
  Filled 2020-09-09 (×2): qty 350

## 2020-09-09 MED ORDER — SIMVASTATIN 20 MG PO TABS
40.0000 mg | ORAL_TABLET | Freq: Every evening | ORAL | Status: DC
  Administered 2020-09-09 – 2020-09-12 (×4): 40 mg via ORAL
  Filled 2020-09-09 (×4): qty 2

## 2020-09-09 MED ORDER — SODIUM CHLORIDE 0.9 % IV SOLN
100.0000 mg | Freq: Once | INTRAVENOUS | Status: AC
  Administered 2020-09-09: 100 mg via INTRAVENOUS
  Filled 2020-09-09: qty 100

## 2020-09-09 MED ORDER — VITAMIN D 25 MCG (1000 UNIT) PO TABS
2000.0000 [IU] | ORAL_TABLET | Freq: Every day | ORAL | Status: DC
  Administered 2020-09-10 – 2020-09-13 (×4): 2000 [IU] via ORAL
  Filled 2020-09-09 (×4): qty 2

## 2020-09-09 MED ORDER — WARFARIN - PHARMACIST DOSING INPATIENT: Freq: Every day | Status: DC

## 2020-09-09 MED ORDER — ACETAMINOPHEN 650 MG RE SUPP: 650.0000 mg | Freq: Four times a day (QID) | RECTAL | Status: DC | PRN

## 2020-09-09 MED ORDER — SODIUM CHLORIDE 0.9 % IV SOLN: 1.0000 g | INTRAVENOUS | Status: DC

## 2020-09-09 MED ORDER — GUAIFENESIN ER 600 MG PO TB12: 600.0000 mg | ORAL_TABLET | Freq: Two times a day (BID) | ORAL | Status: DC | PRN

## 2020-09-09 MED ORDER — SODIUM CHLORIDE 0.9 % IV SOLN
1.0000 g | Freq: Once | INTRAVENOUS | Status: AC
  Administered 2020-09-09: 1 g via INTRAVENOUS
  Filled 2020-09-09: qty 10

## 2020-09-09 MED ORDER — AMLODIPINE BESYLATE 5 MG PO TABS: 5.0000 mg | ORAL_TABLET | Freq: Every day | ORAL | Status: DC

## 2020-09-09 MED ORDER — METOPROLOL TARTRATE 25 MG PO TABS
25.0000 mg | ORAL_TABLET | Freq: Two times a day (BID) | ORAL | Status: DC
  Administered 2020-09-09: 25 mg via ORAL
  Filled 2020-09-09: qty 1

## 2020-09-09 MED ORDER — TAMSULOSIN HCL 0.4 MG PO CAPS
0.4000 mg | ORAL_CAPSULE | Freq: Every day | ORAL | Status: DC
  Administered 2020-09-10 – 2020-09-13 (×4): 0.4 mg via ORAL
  Filled 2020-09-09 (×4): qty 1

## 2020-09-09 MED ORDER — GABAPENTIN 300 MG PO CAPS
300.0000 mg | ORAL_CAPSULE | Freq: Three times a day (TID) | ORAL | Status: DC
  Administered 2020-09-09 – 2020-09-13 (×11): 300 mg via ORAL
  Filled 2020-09-09 (×11): qty 1

## 2020-09-09 MED ORDER — DULOXETINE HCL 30 MG PO CPEP
30.0000 mg | ORAL_CAPSULE | Freq: Every day | ORAL | Status: DC
  Administered 2020-09-10 – 2020-09-13 (×4): 30 mg via ORAL
  Filled 2020-09-09 (×4): qty 1

## 2020-09-09 MED ORDER — POTASSIUM CHLORIDE CRYS ER 20 MEQ PO TBCR
20.0000 meq | EXTENDED_RELEASE_TABLET | Freq: Once | ORAL | Status: AC
  Administered 2020-09-09: 20 meq via ORAL
  Filled 2020-09-09: qty 1

## 2020-09-09 MED ORDER — SODIUM CHLORIDE 0.9% FLUSH
3.0000 mL | Freq: Two times a day (BID) | INTRAVENOUS | Status: DC
  Administered 2020-09-09 – 2020-09-13 (×8): 3 mL via INTRAVENOUS

## 2020-09-09 MED ORDER — INSULIN ASPART 100 UNIT/ML IJ SOLN
0.0000 [IU] | Freq: Three times a day (TID) | INTRAMUSCULAR | Status: DC
  Administered 2020-09-11: 1 [IU] via SUBCUTANEOUS
  Administered 2020-09-12: 2 [IU] via SUBCUTANEOUS
  Administered 2020-09-12 – 2020-09-13 (×2): 1 [IU] via SUBCUTANEOUS

## 2020-09-09 MED ORDER — ACETAMINOPHEN 325 MG PO TABS
650.0000 mg | ORAL_TABLET | Freq: Once | ORAL | Status: AC
  Administered 2020-09-09: 650 mg via ORAL
  Filled 2020-09-09: qty 2

## 2020-09-09 MED ORDER — SODIUM CHLORIDE 0.9 % IV SOLN
2.0000 g | Freq: Three times a day (TID) | INTRAVENOUS | Status: DC
  Administered 2020-09-09 – 2020-09-10 (×3): 2 g via INTRAVENOUS
  Filled 2020-09-09 (×3): qty 2

## 2020-09-09 MED ORDER — LIDOCAINE HCL (PF) 1 % IJ SOLN
5.0000 mL | Freq: Once | INTRAMUSCULAR | Status: AC
  Administered 2020-09-09: 5 mL
  Filled 2020-09-09: qty 5

## 2020-09-09 MED ORDER — IPRATROPIUM-ALBUTEROL 0.5-2.5 (3) MG/3ML IN SOLN
3.0000 mL | Freq: Four times a day (QID) | RESPIRATORY_TRACT | Status: DC
  Filled 2020-09-09: qty 3

## 2020-09-09 MED ORDER — OXYCODONE-ACETAMINOPHEN 7.5-325 MG PO TABS
1.0000 | ORAL_TABLET | Freq: Four times a day (QID) | ORAL | Status: DC | PRN
  Administered 2020-09-13: 1 via ORAL
  Filled 2020-09-09: qty 1

## 2020-09-09 MED ORDER — DONEPEZIL HCL 10 MG PO TABS
10.0000 mg | ORAL_TABLET | Freq: Every day | ORAL | Status: DC
  Administered 2020-09-09 – 2020-09-12 (×4): 10 mg via ORAL
  Filled 2020-09-09 (×4): qty 1

## 2020-09-09 MED ORDER — SODIUM CHLORIDE 0.9 % IV SOLN: 250.0000 mL | INTRAVENOUS | Status: DC | PRN

## 2020-09-09 MED ORDER — ACETAMINOPHEN 325 MG PO TABS
650.0000 mg | ORAL_TABLET | Freq: Four times a day (QID) | ORAL | Status: DC | PRN
  Administered 2020-09-09 – 2020-09-10 (×2): 650 mg via ORAL
  Filled 2020-09-09 (×2): qty 2

## 2020-09-09 MED ORDER — SODIUM CHLORIDE 0.9 % IV SOLN
100.0000 mg | Freq: Two times a day (BID) | INTRAVENOUS | Status: DC
  Administered 2020-09-09 – 2020-09-10 (×3): 100 mg via INTRAVENOUS
  Filled 2020-09-09 (×6): qty 100

## 2020-09-09 MED ORDER — SODIUM CHLORIDE 0.9% FLUSH: 3.0000 mL | INTRAVENOUS | Status: DC | PRN

## 2020-09-09 MED ORDER — ALBUTEROL SULFATE (2.5 MG/3ML) 0.083% IN NEBU: 2.5000 mg | INHALATION_SOLUTION | Freq: Four times a day (QID) | RESPIRATORY_TRACT | Status: DC | PRN

## 2020-09-09 MED ORDER — WARFARIN SODIUM 4 MG PO TABS: 4.0000 mg | ORAL_TABLET | Freq: Once | ORAL | Status: DC

## 2020-09-09 MED ORDER — FLUOXETINE HCL 20 MG PO CAPS
40.0000 mg | ORAL_CAPSULE | Freq: Every day | ORAL | Status: DC
  Administered 2020-09-10 – 2020-09-13 (×4): 40 mg via ORAL
  Filled 2020-09-09 (×4): qty 2

## 2020-09-09 MED ORDER — AZITHROMYCIN 500 MG IV SOLR
500.0000 mg | INTRAVENOUS | Status: DC
Start: 2020-09-10 — End: 2020-09-09

## 2020-09-09 MED ORDER — SODIUM CHLORIDE 0.9 % IV SOLN: 2.0000 g | INTRAVENOUS | Status: DC

## 2020-09-09 MED ORDER — ENOXAPARIN SODIUM 80 MG/0.8ML IJ SOSY
80.0000 mg | PREFILLED_SYRINGE | Freq: Two times a day (BID) | INTRAMUSCULAR | Status: DC
  Administered 2020-09-09 – 2020-09-12 (×6): 80 mg via SUBCUTANEOUS
  Filled 2020-09-09 (×7): qty 0.8

## 2020-09-09 MED ORDER — FENTANYL 25 MCG/HR TD PT72
1.0000 | MEDICATED_PATCH | TRANSDERMAL | Status: DC
  Administered 2020-09-11: 1 via TRANSDERMAL
  Filled 2020-09-09: qty 1

## 2020-09-09 MED ORDER — VANCOMYCIN HCL 750 MG/150ML IV SOLN
750.0000 mg | INTRAVENOUS | Status: AC
  Administered 2020-09-10 – 2020-09-11 (×3): 750 mg via INTRAVENOUS
  Filled 2020-09-09 (×2): qty 150

## 2020-09-09 NOTE — Progress Notes (Signed)
ANTICOAGULATION CONSULT NOTE - Initial Consult  Pharmacy Consult for Warfarin Indication: atrial fibrillation; Hx of DVT  Allergies  Allergen Reactions   Ace Inhibitors     hypotension   Dilaudid [Hydromorphone Hcl]     "Mean"   Metformin And Related     GI upset   Morphine And Related Other (See Comments)    "mean"   Pletal [Cilostazol]     SOB    Patient Measurements:  Vital Signs: Temp: 98.6 F (37 C) (09/05 1457) Temp Source: Oral (09/05 1457) BP: 130/65 (09/05 1415) Pulse Rate: 73 (09/05 1415)  Labs: Recent Labs    09/09/20 1208 09/09/20 1350  HGB 12.6*  --   HCT 41.0  --   PLT 346  --   LABPROT 16.2*  --   INR 1.3*  --   CREATININE 0.77  --   TROPONINIHS 41* 44*    CrCl cannot be calculated (Unknown ideal weight.).   Medical History: Past Medical History:  Diagnosis Date   Anxiety    Avascular necrosis (HCC)    BPH (benign prostatic hyperplasia)    Depression    Diabetes mellitus    DVT (deep venous thrombosis) (HCC)    GERD (gastroesophageal reflux disease)    History of leg amputation (HCC)    both legs   Hypertension    Neuropathy    Peripheral vascular disease (HCC)    Sleep apnea    Medications:  (Not in a hospital admission)  Scheduled:   potassium chloride  20 mEq Oral Once   sodium chloride flush  3 mL Intravenous Q12H   Infusions:   sodium chloride     ceFEPime (MAXIPIME) IV     doxycycline (VIBRAMYCIN) IV     vancomycin     [START ON 09/10/2020] vancomycin     PRN: sodium chloride, acetaminophen **OR** acetaminophen, sodium chloride flush Anti-infectives (From admission, onward)    Start     Dose/Rate Route Frequency Ordered Stop   09/10/20 1700  vancomycin (VANCOREADY) IVPB 750 mg/150 mL        750 mg 150 mL/hr over 60 Minutes Intravenous Every 24 hours 09/09/20 1608 09/14/20 1659   09/10/20 1500  cefTRIAXone (ROCEPHIN) 2 g in sodium chloride 0.9 % 100 mL IVPB  Status:  Discontinued       Note to Pharmacy: 24 hours from  first dose pleaes   2 g 200 mL/hr over 30 Minutes Intravenous Every 24 hours 09/09/20 1421 09/09/20 1510   09/10/20 1000  azithromycin (ZITHROMAX) 500 mg in sodium chloride 0.9 % 250 mL IVPB  Status:  Discontinued        500 mg 250 mL/hr over 60 Minutes Intravenous Every 24 hours 09/09/20 1421 09/09/20 1434   09/09/20 2200  doxycycline (VIBRAMYCIN) 100 mg in sodium chloride 0.9 % 250 mL IVPB        100 mg 125 mL/hr over 120 Minutes Intravenous Every 12 hours 09/09/20 1435 09/14/20 0959   09/09/20 1600  ceFEPIme (MAXIPIME) 2 g in sodium chloride 0.9 % 100 mL IVPB        2 g 200 mL/hr over 30 Minutes Intravenous Every 8 hours 09/09/20 1524     09/09/20 1500  vancomycin (VANCOREADY) IVPB 1750 mg/350 mL        1,750 mg 175 mL/hr over 120 Minutes Intravenous  Once 09/09/20 1453     09/09/20 1445  cefTRIAXone (ROCEPHIN) 1 g in sodium chloride 0.9 % 100 mL IVPB  Status:  Discontinued        1 g 200 mL/hr over 30 Minutes Intravenous NOW 09/09/20 1432 09/09/20 1524   09/09/20 1230  cefTRIAXone (ROCEPHIN) 1 g in sodium chloride 0.9 % 100 mL IVPB        1 g 200 mL/hr over 30 Minutes Intravenous  Once 09/09/20 1227 09/09/20 1345   09/09/20 1230  doxycycline (VIBRAMYCIN) 100 mg in sodium chloride 0.9 % 250 mL IVPB        100 mg 125 mL/hr over 120 Minutes Intravenous  Once 09/09/20 1227 09/09/20 1516      Assessment: Patient with a history of PVD s/p B/L AKA (2007), DVT, a fib (on coumadin), HTN, DM2, COVID PNA (06/01/20) with hypoxia. Patient presenting with AMS.  Patient also w/ shoulder pain for which ortho has performed bedside aspiration.  Patient is on warfarin prior to arrival. Patient taking 2mg  daily - last taken 9/4.  INR today is 1.3 which is sub-therapeutic  Hgb 12.6; plt 346  Goal of Therapy:  INR 2-3 Monitor platelets by anticoagulation protocol: Yes   Plan:  Enoxaparin 1 mg/kg subQ q12h while INR <2 Warfarin 4mg  tonight - subsequent dosing to be determined by INR F/u  daily INR Monitor for s/s of bleeding  11/4, PharmD, BCPS 09/09/2020 5:12 PM ED Clinical Pharmacist -  [phone]

## 2020-09-09 NOTE — ED Triage Notes (Signed)
Pt coming from home. EMS finds pt @ 86% RA. 94% on Roland @ 4LPM. Family reports pt found leaning left this morning and AMS. Reports last normal 1700 9/4 and left shoulder pain yesterday as well.Marland Kitchen HX afib on warfarin.

## 2020-09-09 NOTE — H&P (Addendum)
History and Physical    [name] UQJ:[id number] DOB: 04-Mar-1935 DOA: 09/09/2020  PCP: Kelton Pillar, MD Consultants:  ENT: Dr. Constance Holster, pain: Dr. Greta Doom, pulm: Dr. Lake Bells, neurology: Dr. Posey Pronto  Patient coming from:  Home - lives with his son.   Chief Complaint: AMS  HPI: Mark Russell is a 85 y.o. male with medical history significant of  PVD, DVT on anticoagulation, a fib, HTN, DM2, COPD, OSA, HLD who presented to ED for AMS and concerns for stroke by family.  His daughter is present. He went to bed and didn't get out of bed this AM because his left shoulder hurt and he couldn't move his arm. His son was at home and states he was confused. His son was concerned that he had a stroke with the left arm not moving and confusion.  They called [facility] and when they got there he had a fever and his oxygen was in the 80s.   He appeared his normal self yesterday and last night. He denies any coughing or shortness of breath, chest pain, palpitations, URI symptoms, sick contacts, stomach pain, N/V/D. He has had no falls.   His daughter later tells me that her brother told her that he has been coughing x 2 weeks that is productive.    ED Course: vitals: Temp 101.1, blood pressure 163/67, heart rate 75, respiratory rate 19, oxygen 95%. With ems sats in 80s and required 4L oxygen, now on 2L in ER.  Pertinent labs: WBC 18.6, potassium 3.1, initial troponin 41, lactic acid is 1.5, CRP 26.  CT head stable, chest x-ray with development of asymmetric opacity in the left lower lung zone suspicious for acute pneumonia, left shoulder x-ray no acute abnormality has progressive advanced degenerative changes.  In ER received Tylenol, 1 g Rocephin and started on doxycycline for community-acquired pneumonia.  Blood cultures and urine cultures obtained.  We were called and asked to admit  Review of Systems: As per HPI; otherwise review of systems reviewed and negative.   Ambulatory Status:  wheelchair     Past  Medical History:  Diagnosis Date   Anxiety    Avascular necrosis (HCC)    BPH (benign prostatic hyperplasia)    Depression    Diabetes mellitus    DVT (deep venous thrombosis) (HCC)    GERD (gastroesophageal reflux disease)    History of leg amputation (HCC)    both legs   Hypertension    Neuropathy    Peripheral vascular disease ([place])    Sleep apnea     Past Surgical History:  Procedure Laterality Date   ABDOMINAL AORTIC ANEURYSM [place] N/A 04/27/2012   Procedure: BALLOON DILATION;  Surgeon: Lear Ng, MD;  Location: [facility];  Service: Endoscopy;  Laterality: N/A;  no xray   BYPASS GRAFT     ESOPHAGOGASTRODUODENOSCOPY N/A 04/27/2012   Procedure: ESOPHAGOGASTRODUODENOSCOPY (EGD);  Surgeon: Lear Ng, MD;  Location: [facility];  Service: Endoscopy;  Laterality: N/A;   LEG AMPUTATION THROUGH KNEE  06/2004    Social History   Socioeconomic History   Marital status: Widowed    Spouse name: Not on file   Number of children: 4   Years of education: Not on file   Highest education level: Not on file  Occupational History   Occupation: disabled  Tobacco Use   Smoking status: Former    Packs/day: 0.25    Years: 30.00  Pack years: 7.50    Types: Cigarettes    Quit date: 08/06/1991    Years since quitting: 29.1   Smokeless tobacco: Never  Substance and Sexual Activity   Alcohol use: No   Drug use: Not on file   Sexual activity: Never  Other Topics Concern   Not on file  Social History Narrative   Lives with wife of 34 years.  They have four grown children.         Social Determinants of Health   Financial Resource Strain: Not on file  Food Insecurity: Not on file  Transportation Needs: Not on file  Physical Activity: Not on file  Stress: Not on file  Social Connections: Not on file  Intimate Partner Violence: Not on file    Allergies  Allergen Reactions   Ace Inhibitors      hypotension   Dilaudid [Hydromorphone Hcl]     "Mean"   Metformin And Related     GI upset   Morphine And Related Other (See Comments)    "mean"   Pletal [Cilostazol]     SOB    Family History  Problem Relation Age of Onset   Heart attack Other    Cancer Other    Cancer Other    Heart attack Other    Esophageal cancer Daughter        Living, 52    Prior to Admission medications   Medication Sig Start Date End Date Taking? Authorizing Provider  acetaminophen (TYLENOL) 325 MG tablet Take 650 mg by mouth every 6 (six) hours as needed for mild pain, fever or headache.    [provider]  albuterol (VENTOLIN HFA) 108 (90 Base) MCG/ACT inhaler Inhale 2 puffs into the lungs every 6 (six) hours as needed for wheezing or shortness of breath. 06/10/20   Shelly Coss, MD  amLODipine (NORVASC) 10 MG tablet Take 1 tablet (10 mg total) by mouth daily. 06/20/20   Aline August, MD  Ascorbic Acid (VITAMIN C PO) Take 1 tablet by mouth daily.    [provider]  bismuth subsalicylate (PEPTO BISMOL) 262 MG/15ML suspension Take 30 mLs by mouth every 6 (six) hours as needed for diarrhea or loose stools.    [provider]  Cholecalciferol (VITAMIN D) 50 MCG (2000 UT) tablet Take 2,000 Units by mouth daily.    [provider]  donepezil (ARICEPT) 10 MG tablet Take 10 mg by mouth at bedtime. 04/08/20   [provider]  fentaNYL (DURAGESIC - DOSED MCG/HR) 25 MCG/HR patch Place 1 patch onto the skin every other day. 07/15/15   [provider]  FLUoxetine (PROZAC) 20 MG capsule Take 40 mg by mouth daily.    [provider]  gabapentin (NEURONTIN) 300 MG capsule Take 900 mg by mouth 3 (three) times daily. 06/07/15   [provider]  guaiFENesin-dextromethorphan (ROBITUSSIN DM) 100-10 MG/5ML syrup Take 10 mLs by mouth every 4 (four) hours as needed for cough. 06/10/20   Shelly Coss, MD  loperamide (IMODIUM) 2 MG capsule Take by mouth as  needed for diarrhea or loose stools.    [provider]  Menthol, Topical Analgesic, 2 % GEL Apply 1 application topically 3 (three) times daily as needed (for extremity pain).    [provider]  metoprolol tartrate (LOPRESSOR) 25 MG tablet Take 1 tablet (25 mg total) by mouth 2 (two) times daily. Patient taking differently: Take 50 mg by mouth daily. 06/10/20 06/10/21  Shelly Coss, MD  mineral  oil-hydrophilic petrolatum (AQUAPHOR) ointment Apply 1 application topically as needed (bed sores).    [provider]  PERCOCET 7.5-325 MG tablet Take 1 tablet by mouth every 6 (six) hours as needed for pain. 05/22/20   [provider]  potassium chloride (KLOR-CON) 10 MEQ tablet Take 1 tablet (10 mEq total) by mouth 2 (two) times daily for 14 days. 06/19/20 07/03/20  Aline August, MD  simvastatin (ZOCOR) 40 MG tablet Take 40 mg by mouth every evening.    [provider]  SYMBICORT 160-4.5 MCG/ACT inhaler Inhale 3 puffs into the lungs every 6 (six) hours as needed for wheezing or shortness of breath. 05/24/20   [provider]  tamsulosin (FLOMAX) 0.4 MG CAPS capsule Take 0.4 mg by mouth daily. 05/24/20   [provider]  warfarin (COUMADIN) 4 MG tablet Take 2-4 mg by mouth See admin instructions. 52m on Monday Wendesday Friday and 47mon all other days    [provider]  zinc gluconate 50 MG tablet Take 50 mg by mouth daily.    [provider]    Physical Exam: Vitals:   09/09/20 1127 09/09/20 1130 09/09/20 1215 09/09/20 1230  BP:  (!) 163/67 (!) 156/82 (!) 145/65  Pulse:  75 83 79  Resp:  19 19 17   Temp: (!) 101.1 F (38.4 C)     TempSrc: Oral     SpO2:  95% 95% 93%     General:  Appears calm and comfortable and is in NAD Eyes:  PERRL, EOMI, normal lids, iris ENT:  grossly normal hearing, lips & tongue, mmm; no teeth  Neck:  no LAD, masses or thyromegaly; no carotid bruits Cardiovascular:  RRR, systolic  murmur Respiratory:  rhonchi bilaterally and supraglottic sounds. Exam difficult as he would not sit up for exam.  Normal respiratory effort. Abdomen:  soft, NT, ND, NABS. Umbilical hernia and lipoma in RUQ.  Back:   normal alignment, no CVAT Skin:  no rash or induration seen on limited exam Musculoskeletal:  bilateral BKA. Left shoulder with severe edema over glenohumeral joint and severely limited ROM> will not move his left arm. RUE wnl.   Psychiatric:  grossly normal mood and affect, speech fluent and appropriate, Not oriented to place or date.  Neurologic:  CN 2-12 grossly intact. sensation intact    Radiological Exams on Admission: Independently reviewed - see discussion in A/P where applicable  CT Head Wo Contrast  Result Date: 09/09/2020 CLINICAL DATA:  Mental status changes. Unknown cause. Patient found in to the left. EXAM: CT HEAD WITHOUT CONTRAST TECHNIQUE: Contiguous axial images were obtained from the base of the skull through the vertex without intravenous contrast. COMPARISON:  CT head without contrast 06/14/2020 and 06/06/2020 FINDINGS: Brain: No acute infarct, hemorrhage, or mass lesion is present. Moderate atrophy and white matter changes are stable. The ventricles are proportionate to the degree of atrophy. No significant extraaxial fluid collection is present. The brainstem and cerebellum are within normal limits. Calcified lesion along a left frontal convexity on image 47 of series 3 is stable, consistent with a small meningioma. Vascular: Cirrhotic changes within the cavernous internal carotid arteries are stable. No hyperdense vessel is present. Skull: Calvarium is intact. No focal lytic or blastic lesions are present. No significant extracranial soft tissue lesion is present. Sinuses/Orbits: The paranasal sinuses and mastoid air cells are clear. Bilateral lens replacements are noted. Globes and orbits are otherwise unremarkable. IMPRESSION: 1. No acute intracranial  abnormality or significant interval change. 2.  Stable atrophy and white matter disease. This likely reflects the sequela of chronic microvascular ischemia. 3. Stable small left frontal convexity meningioma. Smaller areas of calcification likely represent dural ossification. Small meningiomas not excluded. Electronically Signed   By: San Morelle M.D.   On: 09/09/2020 13:30   DG Chest Port 1 View  Result Date: 09/09/2020 CLINICAL DATA:  Mental status changes and hypoxia. EXAM: PORTABLE CHEST 1 VIEW COMPARISON:  06/14/2020 FINDINGS: Stable mild cardiac enlargement, aortic atherosclerosis and tortuosity of the thoracic aorta. Since the prior study, development asymmetric pulmonary airspace opacity in the left lower lung zone suspicious for pneumonia. There also is subtle increased opacity at the right lung base consistent with atelectasis or infiltrate. No overt edema or significant pleural effusions. No pneumothorax. IMPRESSION: Development of asymmetric opacity in the left lower lung zone suspicious for acute pneumonia. Mild right basilar atelectasis versus infiltrate. Electronically Signed   By: Aletta Edouard M.D.   On: 09/09/2020 12:09   DG Shoulder Left  Result Date: 09/09/2020 CLINICAL DATA:  Left shoulder pain.  No known trauma. EXAM: LEFT SHOULDER - 2+ VIEW COMPARISON:  Left shoulder radiographs 07/09/2014 FINDINGS: Progressive advanced degenerative changes are noted in the left shoulder. No acute or healing fractures are present. Heart is enlarged. Atherosclerotic changes are noted at the arch. IMPRESSION: 1. Progressive advanced degenerative changes of the left shoulder. 2. No acute abnormality. Electronically Signed   By: San Morelle M.D.   On: 09/09/2020 12:12    EKG: Independently reviewed.  Atrial fibrillation with rate 81, RBBB; nonspecific ST changes with no evidence of acute ischemia   Labs on Admission: I have personally reviewed the available labs and imaging studies at  the time of the admission.  Pertinent labs:  WBC 18.6,  potassium 3.1,  initial troponin 41,  lactic acid is 1.5,  CRP 26. ESR: 55 D-dimer: 1.87 INR: 1.3   Assessment/Plan Principal Problem:   Sepsis due to pneumonia vs. Shoulder joint infection  85 year old found to be hypoxic in the low 80s requiring oxygenation, fever to 101, CXR with LLL infiltrate and  questionable altered mental status found to have left lower lobe pneumonia. Sepsis criteria met with a temperature of 101 and a white blood cell count of 18 Sepsis protocol initiated -Given Rocephin and doxycycline in the ER however known recent MRSA infection and IV abx in past 90 days will cover with Vanco, cefepime and doxy (prolonged qt; however, likely not accurate in setting of atrial fibrillation)  -legionella and strep pneumonia urinary antigen pending  -sputum culture ordered -Will add albuterol PRN -Will add standing Duonebs x 2  -Will add Mucinex for cough   Active Problems:   Acute respiratory failure (HCC) -Respiratory failure with hypoxia to the 80s now requiring oxygen via [place] at 2 L -Likely secondary to new pneumonia -Continue to wean as tolerated -elevated d-dimer with subtherapeutic INR. If no improvement low threshold to check CTA. Bridging with lovenox.   Metabolic encephalopathy  -Likely secondary to sepsis from pneumonia. Continue antibiotics.  -CT head with no acute findings and metabolic labs within normal limits: TSH and b12 pending  -Has been improving but patient not back to baseline -Does have significant polypharmacy with pain meds/gabapentin/SSRI and SNRI if does not continue to improve may need to taper down or stop some of these  Hypokalemia -We will replete x1 today  -Check magnesium -Follow daily   Left shoulder pain -No acute findings on x-ray and shows severe degenerative disease -concern with  acute swelling for possible septic joint in setting of sepsis-ortho consulted and  recommended MRI shoulder and aspiration of joint. F/u on recommendation -May benefit from outpatient PT versus sports med/ortho  Flat troponin Likely secondary to demand ischemia in setting of acute respiratory failure/pneumonia/sepsis.  No chest pain or changes on ekg    PAF (paroxysmal atrial fibrillation) (HCC) -Rate controlled.  Continue beta-blocker and coumadin per pharmacy  -Placed on telemetry    Diabetes type 2 with atherosclerosis of arteries of extremities ([place]) -Well-controlled with recent A1c of 6.5 in June 2022 -Appears to be diet controlled -Low-dose sliding scale insulin and Accu-Cheks per protocol    HTN (hypertension) -Home medication of Norvasc, metoprolol    PVD (peripheral vascular disease) ([place]) -Continue statin    COPD (chronic obstructive pulmonary disease) ([place]) -do not think he is in exacerbation at this time and more pneumonia clinical picture.   -Uses Symbicort on a as needed basis not daily.  Did not continue this -Continue home SABA prn and duonebs per above     OSA (obstructive sleep apnea) -does not use cpap machine at night.     HLD (hyperlipidemia) -Continue simvastatin   Chronic pain  -Continue home pain meds: fentanyl patch, oxycodone, gabapentin, cymbalta and prozac  -pmp website verified and filling correctly He has quite significant polypharmacy that also may be contributing to his altered mental status  There is no height or weight on file to calculate BMI.    Level of care: Telemetry Medical DVT prophylaxis: coumadin   Code Status:  Full - confirmed with patient Family Communication: daughter present at bedside: Remi Deter Disposition Plan:  The patient is from: home  Anticipated d/c is to: per day team  Requires inpatient hospitalization and is at significant risk of neurological worsening, requires constant monitoring, assessment, IV medication and tx, therapy assessment. Patient is acutely ill  Consults called: none   Admission status:  inpatient   Dragon dictation used in completing this note.    Orma Flaming MD [facility]   How to contact the [facility] Attending or Consulting provider [place] or covering provider during after hours [place], for this patient?  Check the care team in [facility] and look for a) attending/consulting [facility] provider listed and b) the [facility] team listed Log into [email or web address] and use [place]'s universal password to access. If you do not have the password, please contact the hospital operator. Locate the [facility] provider you are looking for under [facility] and page to a number that you can be directly reached. If you still have difficulty reaching the provider, please page the [facility] (Director on Call) for the Hospitalists listed on amion for assistance.   09/09/2020, 2:09 PM

## 2020-09-09 NOTE — Progress Notes (Signed)
Shoulder aspirate with gout.  No signs of sepsis in shoulder.   We will aspirate shoulder further and give steroid injection as well tomorrow to hopefully improve symptoms.    Defer to primary team regarding option for systemic steroids, I'm not sure if they are contraindicated in this setting.   Eulas Post, MD

## 2020-09-09 NOTE — Sepsis Progress Note (Signed)
Monitoring for code sepsis protocol. 

## 2020-09-09 NOTE — Consult Note (Signed)
ORTHOPAEDIC CONSULTATION  REQUESTING PHYSICIAN: Orland Mustard, MD  Chief Complaint: Left shoulder pain  HPI: Mark Russell is a 85 y.o. male who complains of acute on chronic left shoulder pain.  He was brought to the hospital today because he had altered mental status, hypoxia, and was unable to move at his normal baseline.  He is a double amputee due to a history of severe peripheral vascular disease, these are above-knee amputations.  He has had chronic shoulder problems, he reports that this was because he worked in [employer] for years.  He has difficulty lifting his arms routinely, although normally can use his arms for transferring.  He denies fevers or chills but the family says that he was in fact febrile, although no one knew that that was the case.  He thinks he may have had an MRI of his shoulder at some place in the past, but that was years ago.  Past Medical History:  Diagnosis Date   Anxiety    Avascular necrosis (HCC)    BPH (benign prostatic hyperplasia)    Depression    Diabetes mellitus    DVT (deep venous thrombosis) (HCC)    GERD (gastroesophageal reflux disease)    History of leg amputation (HCC)    both legs   Hypertension    Neuropathy    Peripheral vascular disease (HCC)    Sleep apnea    Past Surgical History:  Procedure Laterality Date   ABDOMINAL AORTIC ANEURYSM REPAIR  1988   BACK SURGERY  1988, 1989   BALLOON DILATION N/A 04/27/2012   Procedure: BALLOON DILATION;  Surgeon: Shirley Friar, MD;  Location: [facility];  Service: Endoscopy;  Laterality: N/A;  no xray   BYPASS GRAFT     ESOPHAGOGASTRODUODENOSCOPY N/A 04/27/2012   Procedure: ESOPHAGOGASTRODUODENOSCOPY (EGD);  Surgeon: Shirley Friar, MD;  Location: [facility];  Service: Endoscopy;  Laterality: N/A;   LEG AMPUTATION THROUGH KNEE  06/2004   Social History   Socioeconomic History   Marital status: Widowed    Spouse name: Not on file   Number of children: 4    Years of education: Not on file   Highest education level: Not on file  Occupational History   Occupation: disabled  Tobacco Use   Smoking status: Former    Packs/day: 0.25    Years: 30.00    Pack years: 7.50    Types: Cigarettes    Quit date: 08/06/1991    Years since quitting: 29.1   Smokeless tobacco: Never  Substance and Sexual Activity   Alcohol use: No   Drug use: Not on file   Sexual activity: Never  Other Topics Concern   Not on file  Social History Narrative   Lives with wife of 57 years.  They have four grown children.         Social Determinants of Health   Financial Resource Strain: Not on file  Food Insecurity: Not on file  Transportation Needs: Not on file  Physical Activity: Not on file  Stress: Not on file  Social Connections: Not on file   Family History  Problem Relation Age of Onset   Heart attack Other    Cancer Other    Cancer Other    Heart attack Other    Esophageal cancer Daughter        Living, 25   Allergies  Allergen Reactions   Ace Inhibitors     hypotension   Dilaudid [Hydromorphone Hcl]     "  Mean"   Metformin And Related     GI upset   Morphine And Related Other (See Comments)    "mean"   Pletal [Cilostazol]     SOB     Positive ROS: All other systems have been reviewed and were otherwise negative with the exception of those mentioned in the HPI and as above.  Physical Exam: General: Alert, patient interacts reasonably appropriately, although is somewhat agitated, and slightly confused Cardiovascular: Bilateral above-knee amputee Respiratory: No cyanosis, no use of accessory musculature GI: No organomegaly, abdomen is soft and non-tender Skin: No redness or cellulitis around the left shoulder Neurologic: Sensation intact distally Psychiatric: Patient is slightly irritable, but does answer questions and follow commands. Lymphatic: No axillary or cervical lymphadenopathy  MUSCULOSKELETAL: Left shoulder has no active  motion, a very large effusion, and severe pain with motion.  Elbow wrist and hand seem to function normally.  There is not significant warmth, but definitely substantial swelling.  X-rays demonstrate rotator cuff arthropathy  Assessment: Principal Problem:   Sepsis due to pneumonia (HCC) Active Problems:   PVD (peripheral vascular disease) (HCC)   DVT (deep venous thrombosis) (HCC)   Acute respiratory failure (HCC)   Diabetes type 2 with atherosclerosis of arteries of extremities (HCC)   COPD (chronic obstructive pulmonary disease) (HCC)   OSA (obstructive sleep apnea)   PAF (paroxysmal atrial fibrillation) (HCC)   HLD (hyperlipidemia)   HTN (hypertension)   Chronic pain   Hypokalemia   Left shoulder pain   Left shoulder effusion with likely rotator cuff arthropathy exacerbation, current history of pneumonia and sepsis, shoulder symptoms concerning for possible infection  Plan: MRI scan to better delineate the possibility of infection versus rotator cuff arthropathy.  I will also perform an aspirate at the bedside, for further diagnostic information.  Preprocedure diagnosis: Left shoulder effusion Post procedure diagnosis: Same Procedure: Left shoulder aspiration Procedure details: After informed verbal consent was obtained the left anterior shoulder was prepped with chlorhexidine and an 18-gauge needle was used to aspirate 10 mL of yellow appears slightly turbid joint fluid.  This did not look like purulence necessarily.  A Band-Aid was placed and then subsequent reinforcing gauze was also applied.  He tolerated this well.  I will send this to the laboratory for gram stain, culture, sensitivity, cell count, analysis, and crystals.  Will continue to follow during the diagnostic work-up of his shoulder.  If in fact it does appear that he has an infection then he may need to do surgical washout, although I think this is relatively low likelihood.    Eulas Post, MD [facility]  [phone]   09/09/2020 4:48 PM

## 2020-09-09 NOTE — ED Notes (Signed)
Provider at bedside

## 2020-09-09 NOTE — ED Provider Notes (Signed)
[facility] EMERGENCY DEPARTMENT Provider Note   CSN: [id number] Arrival date & time: 09/09/20  1117     History Chief Complaint  Patient presents with   Altered Mental Status    Mark Russell is a 85 y.o. male.  The patient denies any symptoms except for left shoulder pain that has been present for 1 day.  He has been applying icy hot topical preparation.  He denies any trauma.  The patient's son reported to EMS that the patient was leaning to the left and seemed to be more confused than usual.  However, the patient states that he is unable to move his shoulder because it hurts.  Per EMS, when first responders arrived on the scene, the patient had O2 sats in the 80s.  He does not use oxygen typically.  Of note here, he is febrile, but again, this was not a complaint the patient or his family noticed.  The history is provided by the patient and the EMS personnel. The history is limited by the condition of the patient (mild confusion).  Altered Mental Status Presenting symptoms: confusion   Severity:  Moderate Most recent episode:  Today Episode history:  Single Duration: last known well was 5 pm 09/08/20. Timing:  Constant Progression:  Unchanged Chronicity:  New Context: not recent illness and not recent infection   Associated symptoms: slurred speech and weakness (left arm)   Associated symptoms: no abdominal pain, normal movement, no bladder incontinence, no difficulty breathing, no fever, no headaches, no nausea, no palpitations, no rash, no seizures, no visual change and no vomiting       Past Medical History:  Diagnosis Date   Anxiety    Avascular necrosis (HCC)    BPH (benign prostatic hyperplasia)    Depression    Diabetes mellitus    DVT (deep venous thrombosis) (HCC)    GERD (gastroesophageal reflux disease)    History of leg amputation (HCC)    both legs   Hypertension    Neuropathy    Peripheral vascular disease (HCC)    Sleep apnea      Patient Active Problem List   Diagnosis Date Noted   Cellulitis of multiple sites of scalp and neck 06/17/2020   Face lesion 06/15/2020   Physical deconditioning 06/15/2020   PAF (paroxysmal atrial fibrillation) (HCC) 06/15/2020   HLD (hyperlipidemia) 06/15/2020   HTN (hypertension) 06/15/2020   Lung nodule 06/15/2020   Chronic pain 06/15/2020   Supratherapeutic INR 06/14/2020   COVID-19 06/06/2020   Bronchitis 08/09/2015   OSA (obstructive sleep apnea) 11/17/2012   COPD (chronic obstructive pulmonary disease) (HCC) 11/10/2012   Dysphagia 03/29/2012    Class: Acute   Chest pain 03/29/2012   Diabetes type 2 with atherosclerosis of arteries of extremities (HCC) 03/29/2012   Acute respiratory failure (HCC) 08/09/2011   Hypoxemia 08/09/2011   Respiratory acidosis 08/09/2011   ARF (acute renal failure) (HCC) 08/07/2011   Hypoxia 08/06/2011   Leukocytosis 08/06/2011   Sepsis(995.91) 08/06/2011   PVD (peripheral vascular disease) (HCC) 08/06/2011   S/P AKA (above knee amputation) bilateral (HCC) 08/06/2011   DVT (deep venous thrombosis) (HCC) 08/06/2011    Past Surgical History:  Procedure Laterality Date   ABDOMINAL AORTIC ANEURYSM REPAIR  1988   BACK SURGERY  1988, 1989   BALLOON DILATION N/A 04/27/2012   Procedure: BALLOON DILATION;  Surgeon: Shirley Friar, MD;  Location: [facility]COPY;  Service: Endoscopy;  Laterality: N/A;  no xray   BYPASS GRAFT  ESOPHAGOGASTRODUODENOSCOPY N/A 04/27/2012   Procedure: ESOPHAGOGASTRODUODENOSCOPY (EGD);  Surgeon: Shirley Friar, MD;  Location: [facility];  Service: Endoscopy;  Laterality: N/A;   LEG AMPUTATION THROUGH KNEE  06/2004       Family History  Problem Relation Age of Onset   Heart attack Other    Cancer Other    Cancer Other    Heart attack Other    Esophageal cancer Daughter        Living, 45    Social History   Tobacco Use   Smoking status: Former    Packs/day: 0.25    Years: 30.00    Pack years:  7.50    Types: Cigarettes    Quit date: 08/06/1991    Years since quitting: 29.1   Smokeless tobacco: Never  Substance Use Topics   Alcohol use: No    Home Medications Prior to Admission medications   Medication Sig Start Date End Date Taking? Authorizing Provider  acetaminophen (TYLENOL) 325 MG tablet Take 650 mg by mouth every 6 (six) hours as needed for mild pain, fever or headache.    [provider]  albuterol (VENTOLIN HFA) 108 (90 Base) MCG/ACT inhaler Inhale 2 puffs into the lungs every 6 (six) hours as needed for wheezing or shortness of breath. 06/10/20   Burnadette Pop, MD  amLODipine (NORVASC) 10 MG tablet Take 1 tablet (10 mg total) by mouth daily. 06/20/20   Glade Lloyd, MD  Ascorbic Acid (VITAMIN C PO) Take 1 tablet by mouth daily.    [provider]  bismuth subsalicylate (PEPTO BISMOL) 262 MG/15ML suspension Take 30 mLs by mouth every 6 (six) hours as needed for diarrhea or loose stools.    [provider]  Cholecalciferol (VITAMIN D) 50 MCG (2000 UT) tablet Take 2,000 Units by mouth daily.    [provider]  donepezil (ARICEPT) 10 MG tablet Take 10 mg by mouth at bedtime. 04/08/20   [provider]  fentaNYL (DURAGESIC - DOSED MCG/HR) 25 MCG/HR patch Place 1 patch onto the skin every other day. 07/15/15   [provider]  FLUoxetine (PROZAC) 20 MG capsule Take 40 mg by mouth daily.    [provider]  gabapentin (NEURONTIN) 300 MG capsule Take 900 mg by mouth 3 (three) times daily. 06/07/15   [provider]  guaiFENesin-dextromethorphan (ROBITUSSIN DM) 100-10 MG/5ML syrup Take 10 mLs by mouth every 4 (four) hours as needed for cough. 06/10/20   Burnadette Pop, MD  loperamide (IMODIUM) 2 MG capsule Take by mouth as needed for diarrhea or loose stools.    [provider]  Menthol, Topical Analgesic, 2 % GEL Apply 1 application topically 3 (three) times daily as needed (for extremity pain).    [provider]  metoprolol tartrate (LOPRESSOR) 25 MG tablet Take 1 tablet (25 mg total) by mouth 2 (two) times daily. Patient taking differently: Take 50 mg by mouth daily. 06/10/20 06/10/21  Burnadette Pop, MD  mineral oil-hydrophilic petrolatum (AQUAPHOR) ointment Apply 1 application topically as needed (bed sores).    [provider]  PERCOCET 7.5-325 MG tablet Take 1 tablet by mouth every 6 (six) hours as needed for pain. 05/22/20   [provider]  potassium chloride (KLOR-CON) 10 MEQ tablet Take 1 tablet (10 mEq total) by mouth 2 (two) times daily for 14 days. 06/19/20 07/03/20  Glade Lloyd, MD  simvastatin (ZOCOR) 40 MG tablet Take 40 mg by mouth every evening.    [provider]  SYMBICORT 160-4.5 MCG/ACT inhaler Inhale 3 puffs into the lungs every 6 (six) hours as needed for wheezing or shortness of breath. 05/24/20   [provider]  tamsulosin (FLOMAX) 0.4 MG CAPS capsule Take 0.4 mg by mouth daily. 05/24/20   [provider]  warfarin (COUMADIN) 4 MG tablet Take 2-4 mg by mouth See admin instructions. 2mg  on Monday Wendesday Friday and 4mg  on all other days    [provider]  zinc gluconate 50 MG tablet Take 50 mg by mouth daily.    [provider]    Allergies    Ace inhibitors, Dilaudid [hydromorphone hcl], Metformin and related, Morphine and related, and Pletal [cilostazol]  Review of Systems   Review of Systems  Constitutional:  Negative for chills and fever.  HENT:  Negative for ear pain and sore throat.   Eyes:  Negative for pain and visual disturbance.  Respiratory:  Negative for cough and shortness of breath.   Cardiovascular:  Negative for chest pain and palpitations.  Gastrointestinal:  Negative for abdominal pain, nausea and vomiting.  Genitourinary:  Negative for bladder incontinence, dysuria and hematuria.  Musculoskeletal:  Positive for joint swelling. Negative for arthralgias and back pain.  Skin:   Negative for color change and rash.  Neurological:  Positive for speech difficulty and weakness (left arm). Negative for seizures, syncope and headaches.  Psychiatric/Behavioral:  Positive for confusion.   All other systems reviewed and are negative.  Physical Exam Updated Vital Signs BP (!) 145/65   Pulse 79   Temp (!) 101.1 F (38.4 C) (Oral)   Resp 17   SpO2 93%   Physical Exam Constitutional:      General: He is not in acute distress. HENT:     Head: Normocephalic and atraumatic.     Nose: Nose normal.     Mouth/Throat:     Mouth: Mucous membranes are dry.     Comments: edentulous Eyes:     Extraocular Movements: Extraocular movements intact.     Conjunctiva/sclera: Conjunctivae normal.     Pupils: Pupils are equal, round, and reactive to light.  Cardiovascular:     Rate and Rhythm: Normal rate. Rhythm irregular.  Pulmonary:     Effort: Pulmonary effort is normal.     Breath sounds: Normal breath sounds.  Abdominal:     General: There is no distension.     Tenderness: There is no abdominal tenderness. There is no guarding.  Musculoskeletal:     Cervical back: No tenderness.     Comments: Bilateral AKA's.  Left shoulder has a large effusion.  It is not warm.  He has very limited active range of motion at the shoulder secondary to pain.  Skin:    General: Skin is warm and dry.  Neurological:     Mental Status: He is alert.     Comments: Patient is alert.  Seems to be mildly confused because he is having difficulty following commands.  He has no cranial nerve deficits.  Possibly mild weakness for shoulder abduction, but he endorses pain more so than weakness.  Otherwise, no focal neurologic deficits were noted.  Psychiatric:        Mood and Affect: Mood normal.        Behavior: Behavior normal.    ED Results / Procedures / Treatments   Labs (all labs ordered are listed, but only abnormal results are displayed) Labs Reviewed  CBC WITH DIFFERENTIAL/PLATELET -  Abnormal; Notable for the following components:  Result Value   WBC 18.6 (*)    RBC 4.21 (*)    Hemoglobin 12.6 (*)    Neutro Abs 16.3 (*)    Lymphs Abs 0.5 (*)    Monocytes Absolute 1.6 (*)    Abs Immature Granulocytes 0.13 (*)    All other components within normal limits  BRAIN NATRIURETIC PEPTIDE - Abnormal; Notable for the following components:   B Natriuretic Peptide 224.5 (*)    All other components within normal limits  D-DIMER, QUANTITATIVE - Abnormal; Notable for the following components:   D-Dimer, Quant 1.87 (*)    All other components within normal limits  COMPREHENSIVE METABOLIC PANEL - Abnormal; Notable for the following components:   Potassium 3.1 (*)    Chloride 97 (*)    Glucose, Bld 139 (*)    BUN 7 (*)    Calcium 8.6 (*)    Albumin 2.8 (*)    AST 14 (*)    All other components within normal limits  C-REACTIVE PROTEIN - Abnormal; Notable for the following components:   CRP 26.0 (*)    All other components within normal limits  PROTIME-INR - Abnormal; Notable for the following components:   Prothrombin Time 16.2 (*)    INR 1.3 (*)    All other components within normal limits  TROPONIN I (HIGH SENSITIVITY) - Abnormal; Notable for the following components:   Troponin I (High Sensitivity) 41 (*)    All other components within normal limits  RESP PANEL BY RT-PCR (FLU A&B, COVID) ARPGX2  CULTURE, BLOOD (ROUTINE X 2)  CULTURE, BLOOD (ROUTINE X 2)  URINE CULTURE  LACTIC ACID, PLASMA  URINALYSIS, ROUTINE W REFLEX MICROSCOPIC  SEDIMENTATION RATE  TROPONIN I (HIGH SENSITIVITY)    EKG EKG Interpretation  Date/Time:  Monday September 09 2020 11:24:56 EDT Ventricular Rate:  82 PR Interval:    QRS Duration: 159 QT Interval:  562 QTC Calculation: 657 R Axis:   86 Text Interpretation: Atrial fibrillation Right bundle branch block ST depr, consider ischemia, inferior leads No acute ischemia Confirmed by Pieter Partridge ([id number]) on 09/09/2020 11:41:21  AM  Radiology CT Head Wo Contrast  Result Date: 09/09/2020 CLINICAL DATA:  Mental status changes. Unknown cause. Patient found in to the left. EXAM: CT HEAD WITHOUT CONTRAST TECHNIQUE: Contiguous axial images were obtained from the base of the skull through the vertex without intravenous contrast. COMPARISON:  CT head without contrast 06/14/2020 and 06/06/2020 FINDINGS: Brain: No acute infarct, hemorrhage, or mass lesion is present. Moderate atrophy and white matter changes are stable. The ventricles are proportionate to the degree of atrophy. No significant extraaxial fluid collection is present. The brainstem and cerebellum are within normal limits. Calcified lesion along a left frontal convexity on image 47 of series 3 is stable, consistent with a small meningioma. Vascular: Cirrhotic changes within the cavernous internal carotid arteries are stable. No hyperdense vessel is present. Skull: Calvarium is intact. No focal lytic or blastic lesions are present. No significant extracranial soft tissue lesion is present. Sinuses/Orbits: The paranasal sinuses and mastoid air cells are clear. Bilateral lens replacements are noted. Globes and orbits are otherwise unremarkable. IMPRESSION: 1. No acute intracranial abnormality or significant interval change. 2. Stable atrophy and white matter disease. This likely reflects the sequela of chronic microvascular ischemia. 3. Stable small left frontal convexity meningioma. Smaller areas of calcification likely represent dural ossification. Small meningiomas not excluded. Electronically Signed   By: Marin Roberts M.D.   On: 09/09/2020 13:30   DG  Chest Port 1 View  Result Date: 09/09/2020 CLINICAL DATA:  Mental status changes and hypoxia. EXAM: PORTABLE CHEST 1 VIEW COMPARISON:  06/14/2020 FINDINGS: Stable mild cardiac enlargement, aortic atherosclerosis and tortuosity of the thoracic aorta. Since the prior study, development asymmetric pulmonary airspace opacity in  the left lower lung zone suspicious for pneumonia. There also is subtle increased opacity at the right lung base consistent with atelectasis or infiltrate. No overt edema or significant pleural effusions. No pneumothorax. IMPRESSION: Development of asymmetric opacity in the left lower lung zone suspicious for acute pneumonia. Mild right basilar atelectasis versus infiltrate. Electronically Signed   By: Irish LackGlenn  Yamagata M.D.   On: 09/09/2020 12:09   DG Shoulder Left  Result Date: 09/09/2020 CLINICAL DATA:  Left shoulder pain.  No known trauma. EXAM: LEFT SHOULDER - 2+ VIEW COMPARISON:  Left shoulder radiographs 07/09/2014 FINDINGS: Progressive advanced degenerative changes are noted in the left shoulder. No acute or healing fractures are present. Heart is enlarged. Atherosclerotic changes are noted at the arch. IMPRESSION: 1. Progressive advanced degenerative changes of the left shoulder. 2. No acute abnormality. Electronically Signed   By: Marin Robertshristopher  Mattern M.D.   On: 09/09/2020 12:12    Procedures Procedures   Medications Ordered in ED Medications  doxycycline (VIBRAMYCIN) 100 mg in sodium chloride 0.9 % 250 mL IVPB (100 mg Intravenous New Bag/Given 09/09/20 1316)  lidocaine (PF) (XYLOCAINE) 1 % injection 5 mL (5 mLs Infiltration Given by Other 09/09/20 1317)  cefTRIAXone (ROCEPHIN) 1 g in sodium chloride 0.9 % 100 mL IVPB (1 g Intravenous New Bag/Given 09/09/20 1315)  acetaminophen (TYLENOL) tablet 650 mg (650 mg Oral Given 09/09/20 1326)    ED Course  I have reviewed the triage vital signs and the nursing notes.  Pertinent labs & imaging results that were available during my care of the patient were reviewed by me and considered in my medical decision making (see chart for details).  Clinical Course as of 09/09/20 1407  Mon Sep 09, 2020  1406 Dr. Artis FlockWolfe will accept the patient. [AW]    Clinical Course User Index [AW] Koleen DistanceWright, [place] Mer[place] G, MD   MDM Rules/Calculators/A&P                            Dema SeverinWilfred Renault presented with altered mental status.  He had a new oxygen requirement.  Initially, the differential diagnoses were relatively broad, and EMS was concerned about a stroke.  However, I think the patient's confusion was likely more consistent with delirium.  His left sided weakness is likely pain secondary to arthritis.  Fever source seems to be a left-sided pneumonia, and this correlates with an elevated white count and an oxygen requirement of 2 L.  Initially I considered all possible sources of sepsis including septic arthritis of the left shoulder.  However, this joint is not warm or erythematous.  X-ray reveals advanced arthritis.  I think the effusion fits with a degenerative cause, and his fever is explained by his pneumonia.  He was treated for community-acquired pneumonia and will be admitted for further treatment.  He is currently feeling much better. Final Clinical Impression(s) / ED Diagnoses Final diagnoses:  Community acquired pneumonia of left lower lobe of lung  Diabetes type 2 with atherosclerosis of arteries of extremities (HCC)    Rx / DC Orders ED Discharge Orders     None        Koleen DistanceWright, Lizzette Carbonell G, MD 09/09/20 1410

## 2020-09-09 NOTE — Progress Notes (Signed)
Pharmacy Antibiotic Note  Mark Russell is a 85 y.o. male admitted on 09/09/2020 with pneumonia.  Pharmacy has been consulted for vancomycin and cefepime dosing.  Patient with a history of PVD s/p B/L AKA (2007), DVT, a fib (on coumadin), HTN, DM2, COVID PNA (06/01/20) with hypoxia. Patient presenting with AMS.   Patient admitted in June '22 and found to have right retroauricular cellulitis with possible abscess with associated MRSA culture.  CXR with asymmetric opacity in the left lower lung zone suspicious for acute pneumonia. Mild right basilar atelectasis versus infiltrate.  SCr is 0.77 which is at baseline. WBC 12.6; LA 1.5  Plan: Cefepime 2g q8h Doxycycline 100 mg q12h  Vancomycin 1750mg  once then 750 mg every 42 hours unless change in renal function (eAUC 540) - 5 day therapy indicated on consult Monitor renal function and adjust accordingly Levels at steady state Trend WBC, fever De-escalate when appropriate     Temp (24hrs), Avg:101.1 F (38.4 C), Min:101.1 F (38.4 C), Max:101.1 F (38.4 C)  Recent Labs  Lab 09/09/20 1208  WBC 18.6*  CREATININE 0.77  LATICACIDVEN 1.5    CrCl cannot be calculated (Unknown ideal weight.).    Allergies  Allergen Reactions   Ace Inhibitors     hypotension   Dilaudid [Hydromorphone Hcl]     "Mean"   Metformin And Related     GI upset   Morphine And Related Other (See Comments)    "mean"   Pletal [Cilostazol]     SOB   Antimicrobials this admission: cefepime 9/5 >>  vancomycin 9/5 >>  doxycycline 9/5 >>  Microbiology results: Pending  Thank you for allowing pharmacy to be a part of this patient's care.  11/5, PharmD, BCPS 09/09/2020 2:53 PM ED Clinical Pharmacist -  [phone]

## 2020-09-10 ENCOUNTER — Inpatient Hospital Stay (HOSPITAL_COMMUNITY): Payer: Medicare Other

## 2020-09-10 LAB — CBC
HCT: 37.6 % — ABNORMAL LOW (ref 39.0–52.0)
Hemoglobin: 11.6 g/dL — ABNORMAL LOW (ref 13.0–17.0)
MCH: 29.5 pg (ref 26.0–34.0)
MCHC: 30.9 g/dL (ref 30.0–36.0)
MCV: 95.7 fL (ref 80.0–100.0)
Platelets: 317 10*3/uL (ref 150–400)
RBC: 3.93 MIL/uL — ABNORMAL LOW (ref 4.22–5.81)
RDW: 14.7 % (ref 11.5–15.5)
WBC: 13.2 10*3/uL — ABNORMAL HIGH (ref 4.0–10.5)
nRBC: 0 % (ref 0.0–0.2)

## 2020-09-10 LAB — GLUCOSE, CAPILLARY
Glucose-Capillary: 108 mg/dL — ABNORMAL HIGH (ref 70–99)
Glucose-Capillary: 100 mg/dL — ABNORMAL HIGH (ref 70–99)
Glucose-Capillary: 111 mg/dL — ABNORMAL HIGH (ref 70–99)
Glucose-Capillary: 95 mg/dL (ref 70–99)
Glucose-Capillary: 176 mg/dL — ABNORMAL HIGH (ref 70–99)

## 2020-09-10 LAB — URINE CULTURE

## 2020-09-10 LAB — STREP PNEUMONIAE URINARY ANTIGEN: Strep Pneumo Urinary Antigen: NEGATIVE

## 2020-09-10 LAB — BASIC METABOLIC PANEL
Anion gap: 9 (ref 5–15)
BUN: 10 mg/dL (ref 8–23)
CO2: 31 mmol/L (ref 22–32)
Calcium: 8.4 mg/dL — ABNORMAL LOW (ref 8.9–10.3)
Chloride: 100 mmol/L (ref 98–111)
Creatinine, Ser: 0.58 mg/dL — ABNORMAL LOW (ref 0.61–1.24)
GFR, Estimated: >60 mL/min (ref >60)
Glucose, Bld: 62 mg/dL — ABNORMAL LOW (ref 70–99)
Potassium: 2.6 mmol/L — CL (ref 3.5–5.1)
Sodium: 140 mmol/L (ref 135–145)

## 2020-09-10 LAB — PROTIME-INR
INR: 1.3 — ABNORMAL HIGH (ref 0.8–1.2)
Prothrombin Time: 16.6 seconds — ABNORMAL HIGH (ref 11.4–15.2)

## 2020-09-10 LAB — C-REACTIVE PROTEIN: CRP: 29.4 mg/dL — ABNORMAL HIGH (ref <1.0)

## 2020-09-10 LAB — BRAIN NATRIURETIC PEPTIDE: B Natriuretic Peptide: 180.1 pg/mL — ABNORMAL HIGH (ref 0.0–100.0)

## 2020-09-10 LAB — MAGNESIUM: Magnesium: 2.1 mg/dL (ref 1.7–2.4)

## 2020-09-10 LAB — URIC ACID: Uric Acid, Serum: 3.6 mg/dL — ABNORMAL LOW (ref 3.7–8.6)

## 2020-09-10 LAB — POTASSIUM: Potassium: 4 mmol/L (ref 3.5–5.1)

## 2020-09-10 LAB — PROCALCITONIN: Procalcitonin: 0.66 ng/mL

## 2020-09-10 LAB — MRSA NEXT GEN BY PCR, NASAL: MRSA by PCR Next Gen: DETECTED — AB

## 2020-09-10 MED ORDER — SODIUM CHLORIDE 0.9 % IV SOLN
2.0000 g | Freq: Two times a day (BID) | INTRAVENOUS | Status: DC
  Administered 2020-09-10 – 2020-09-13 (×6): 2 g via INTRAVENOUS
  Filled 2020-09-10 (×6): qty 2

## 2020-09-10 MED ORDER — CYANOCOBALAMIN 1000 MCG/ML IJ SOLN
1000.0000 ug | Freq: Every day | INTRAMUSCULAR | Status: DC
Start: 2020-09-10 — End: 2020-09-13
  Administered 2020-09-10 – 2020-09-13 (×4): 1000 ug via SUBCUTANEOUS
  Filled 2020-09-10 (×4): qty 1

## 2020-09-10 MED ORDER — POTASSIUM CHLORIDE 10 MEQ/100ML IV SOLN
10.0000 meq | INTRAVENOUS | Status: DC
  Administered 2020-09-10: 10 meq via INTRAVENOUS

## 2020-09-10 MED ORDER — COLCHICINE 0.3 MG HALF TABLET
0.3000 mg | ORAL_TABLET | Freq: Every day | ORAL | Status: DC
  Administered 2020-09-10 – 2020-09-13 (×4): 0.3 mg via ORAL
  Filled 2020-09-10 (×4): qty 1

## 2020-09-10 MED ORDER — POTASSIUM CHLORIDE 10 MEQ/100ML IV SOLN
10.0000 meq | INTRAVENOUS | Status: DC
Start: 2020-09-10 — End: 2020-09-10
  Administered 2020-09-10 (×4): 10 meq via INTRAVENOUS
  Filled 2020-09-10 (×6): qty 100

## 2020-09-10 MED ORDER — AMLODIPINE BESYLATE 10 MG PO TABS
10.0000 mg | ORAL_TABLET | Freq: Every day | ORAL | Status: DC
  Administered 2020-09-10 – 2020-09-13 (×4): 10 mg via ORAL
  Filled 2020-09-10 (×4): qty 1

## 2020-09-10 MED ORDER — LORAZEPAM 2 MG/ML IJ SOLN
1.0000 mg | Freq: Once | INTRAMUSCULAR | Status: DC | PRN
Start: 2020-09-10 — End: 2020-09-13

## 2020-09-10 MED ORDER — METOPROLOL TARTRATE 50 MG PO TABS
50.0000 mg | ORAL_TABLET | Freq: Two times a day (BID) | ORAL | Status: DC
  Administered 2020-09-10 – 2020-09-13 (×6): 50 mg via ORAL
  Filled 2020-09-10 (×7): qty 1

## 2020-09-10 MED ORDER — CLONIDINE HCL 0.2 MG PO TABS
0.2000 mg | ORAL_TABLET | Freq: Once | ORAL | Status: AC
  Administered 2020-09-10: 0.2 mg via ORAL
  Filled 2020-09-10: qty 1

## 2020-09-10 MED ORDER — BUPIVACAINE HCL (PF) 0.5 % IJ SOLN
10.0000 mL | Freq: Once | INTRAMUSCULAR | Status: DC
  Filled 2020-09-10 (×2): qty 10

## 2020-09-10 MED ORDER — MUPIROCIN 2 % EX OINT
1.0000 "application " | TOPICAL_OINTMENT | Freq: Two times a day (BID) | CUTANEOUS | Status: DC
  Administered 2020-09-10 – 2020-09-13 (×6): 1 via NASAL
  Filled 2020-09-10: qty 22

## 2020-09-10 MED ORDER — CHLORHEXIDINE GLUCONATE CLOTH 2 % EX PADS
6.0000 | MEDICATED_PAD | Freq: Every day | CUTANEOUS | Status: DC
  Administered 2020-09-10 – 2020-09-13 (×4): 6 via TOPICAL

## 2020-09-10 MED ORDER — METHYLPREDNISOLONE ACETATE 40 MG/ML IJ SUSP
40.0000 mg | Freq: Once | INTRAMUSCULAR | Status: DC
Start: 2020-09-10 — End: 2020-09-13
  Filled 2020-09-10: qty 1

## 2020-09-10 MED ORDER — POTASSIUM CHLORIDE CRYS ER 20 MEQ PO TBCR
40.0000 meq | EXTENDED_RELEASE_TABLET | Freq: Once | ORAL | Status: AC
  Administered 2020-09-10: 40 meq via ORAL
  Filled 2020-09-10: qty 2

## 2020-09-10 MED ORDER — WARFARIN SODIUM 6 MG PO TABS
6.0000 mg | ORAL_TABLET | Freq: Once | ORAL | Status: AC
  Administered 2020-09-10: 6 mg via ORAL
  Filled 2020-09-10: qty 1

## 2020-09-10 MED ORDER — POTASSIUM CHLORIDE 10 MEQ/100ML IV SOLN
10.0000 meq | Freq: Once | INTRAVENOUS | Status: AC
  Administered 2020-09-10: 10 meq via INTRAVENOUS

## 2020-09-10 NOTE — Progress Notes (Addendum)
ANTICOAGULATION CONSULT NOTE - Pharmacy Consult for Warfarin / Lovenox Indication: atrial fibrillation; Hx of DVT  Allergies  Allergen Reactions   Ace Inhibitors     hypotension   Dilaudid [Hydromorphone Hcl]     "Mean"   Metformin And Related     GI upset   Morphine And Related Other (See Comments)    "mean"   Pletal [Cilostazol]     SOB    Patient Measurements:  Vital Signs: Temp: 98.4 F (36.9 C) (09/06 0722) Temp Source: Oral (09/06 0722) BP: 136/83 (09/06 0722) Pulse Rate: 69 (09/06 0722)  Labs: Recent Labs    09/09/20 1208 09/09/20 1350 09/10/20 0301  HGB 12.6*  --  11.6*  HCT 41.0  --  37.6*  PLT 346  --  317  LABPROT 16.2*  --  16.6*  INR 1.3*  --  1.3*  CREATININE 0.77  --  0.58*  TROPONINIHS 41* 44*  --      Estimated Creatinine Clearance: 45.7 mL/min (A) (by C-G formula based on SCr of 0.58 mg/dL (L)).   Assessment: Patient with a history of PVD s/p B/L AKA (2007), DVT, a fib (on coumadin), HTN, DM2, COVID PNA (06/01/20) with hypoxia. Patient presenting with AMS.  Patient is on warfarin prior to arrival. Patient taking 2mg  daily - last taken 9/4.  INR subtherapeutic  Goal of Therapy:  INR 2-3 Monitor platelets by anticoagulation protocol: Yes Appropriate Lovenox dosing   Plan:  Enoxaparin 1 mg/kg subQ q12h while INR <2 Warfarin 6 mg tonight Follow up AM INR  Thank you 11/4, PharmD  09/10/2020 8:38 AM

## 2020-09-10 NOTE — Evaluation (Signed)
Occupational Therapy Evaluation Patient Details Name: Mark Russell MRN: [id number] DOB: April 16, 1935 Today's Date: 09/10/2020    History of Present Illness 85 y.o. male presented to ED 09/09/20 for AMS and concerns for stroke. +pneumonia, sepsis, Lt shoulder pain (?septic arthritis with MRI pending); ortho consulted with left shoulder aspirated 09/09/20  PMH significant of  PVD, DVT on anticoagulation, a fib, HTN, DM2, COPD, OSA, HLD, bil AKA amputations   Clinical Impression   Pt agreed to session and was limited to L shoulder pain as decrease of any AROM in L shoulder. Pt reported on at PLOF they would scoot in and out of power chair to go around the home and complete ADLS without transfer board. Pt at this time required mod x 2 for supine to sitting, while long sitting in bed required min x2 to posterior transfer in bed level. If pt does not have family support to assist with transfers they will require SNF stay but pt is reporting they have enough assistance at home to assist with transfers. Pt currently with functional limitations due to the deficits listed below (see OT Problem List).  Pt will benefit from skilled OT to increase their safety and independence with ADL and functional mobility for ADL to facilitate discharge to venue listed below.     Follow Up Recommendations  Home health OT;Supervision - Intermittent;Other (comment) (but if family can not assist in transfer may need SNF visit)    Equipment Recommendations       Recommendations for Other Services       Precautions / Restrictions Precautions Precautions: Fall Precaution Comments: L shoulder pain Restrictions Weight Bearing Restrictions: No      Mobility Bed Mobility Overal bed mobility: Needs Assistance Bed Mobility: Rolling;Sidelying to Sit;Sit to Supine;Supine to Sit Rolling: Min assist Sidelying to sit: Mod assist;+2 for physical assistance;HOB elevated Supine to sit: Mod assist;HOB elevated Sit to supine: Min  assist;HOB elevated   General bed mobility comments: pt normally exits bed to his left and attempted that today; incr left shoulder pain made it difficult for him to "swing up" the way he normally does    Transfers Overall transfer level: Needs assistance Equipment used:  (bed pad under hips) Transfers: Licensed conveyancer transfers: Min assist;+2 physical assistance   General transfer comment: attempted lateral scoot and pt unable to take enough pressure on his left arm to scoot; able to posteriorly scoot/walk on his hips toward HOB with mild incr dyspnea and needing assist with bed pad +2 to complete task    Balance Overall balance assessment: Needs assistance Sitting-balance support: No upper extremity supported Sitting balance-Leahy Scale: Good Sitting balance - Comments: can weight shift without use of UEs                                   ADL either performed or assessed with clinical judgement   ADL Overall ADL's : Needs assistance/impaired Eating/Feeding: Independent;Sitting   Grooming: Wash/dry hands;Wash/dry face;Set up;Cueing for safety;Cueing for sequencing;Sitting   Upper Body Bathing: Moderate assistance;Cueing for safety;Cueing for sequencing;Sitting   Lower Body Bathing: Maximal assistance;Cueing for safety;Cueing for sequencing;Sitting/lateral leans   Upper Body Dressing : Minimal assistance;Cueing for safety;Cueing for sequencing;Sitting   Lower Body Dressing: Maximal assistance;Cueing for safety;Cueing for sequencing;Sitting/lateral leans  Vision         Perception     Praxis      Pertinent Vitals/Pain Pain Assessment: Faces Faces Pain Scale: Hurts whole lot Pain Location: left shoulder Pain Descriptors / Indicators: Grimacing;Guarding;Sharp Pain Intervention(s): Limited activity within patient's tolerance;Monitored during session     Hand Dominance Right    Extremity/Trunk Assessment Upper Extremity Assessment Upper Extremity Assessment: LUE deficits/detail LUE Deficits / Details: decrease in any AROM at shoulder level LUE: Unable to fully assess due to pain LUE Sensation: WNL LUE Coordination: decreased gross motor   Lower Extremity Assessment Lower Extremity Assessment: Defer to PT evaluation   Cervical / Trunk Assessment Cervical / Trunk Assessment: Other exceptions Cervical / Trunk Exceptions: overweight   Communication Communication Communication: No difficulties   Cognition Arousal/Alertness: Awake/alert Behavior During Therapy: WFL for tasks assessed/performed Overall Cognitive Status: No family/caregiver present to determine baseline cognitive functioning Area of Impairment: Safety/judgement;Awareness;Problem solving                         Safety/Judgement: Decreased awareness of deficits Awareness: Intellectual Problem Solving: Slow processing;Difficulty sequencing;Requires verbal cues;Requires tactile cues General Comments: patient not comprehending that he will not be able to do transfers and bed mobility the way he has been doing now that he has severe left shoulder pain; at times has difficulty understanding the question being asked   General Comments       Exercises     Shoulder Instructions      Home Living Family/patient expects to be discharged to:: Private residence Living Arrangements: Children Available Help at Discharge: Family Type of Home: House Home Access: Ramped entrance     Home Layout: One level     Bathroom Shower/Tub: Chief Strategy Officer: Handicapped height Bathroom Accessibility: Yes   Home Equipment: Tub bench;Wheelchair - power   Additional Comments: uses a overhead trapeze over his bed      Prior Functioning/Environment Level of Independence: Needs assistance  Gait / Transfers Assistance Needed: independent with lateral transfers (no longer uses  sliding board bed to chair) ADL's / Homemaking Assistance Needed: Reports son helps with laundry, IADLs; does have wheelchair accessible Zenaida Niece   Comments: Independenet at wc level.        OT Problem List: Decreased strength;Decreased range of motion;Decreased activity tolerance;Impaired balance (sitting and/or standing);Decreased safety awareness;Decreased knowledge of use of DME or AE;Pain      OT Treatment/Interventions: Self-care/ADL training;Therapeutic exercise;DME and/or AE instruction;Therapeutic activities;Patient/family education;Balance training    OT Goals(Current goals can be found in the care plan section) Acute Rehab OT Goals Patient Stated Goal: get shoulder better and go home OT Goal Formulation: With patient Time For Goal Achievement: 09/28/20 Potential to Achieve Goals: Good  OT Frequency: Min 2X/week   Barriers to D/C:            Co-evaluation PT/OT/SLP Co-Evaluation/Treatment: Yes Reason for Co-Treatment: Complexity of the patient's impairments (multi-system involvement)   OT goals addressed during session: ADL's and self-care      AM-PAC OT "6 Clicks" Daily Activity     Outcome Measure Help from another person eating meals?: None Help from another person taking care of personal grooming?: A Little Help from another person toileting, which includes using toliet, bedpan, or urinal?: A Lot Help from another person bathing (including washing, rinsing, drying)?: A Lot Help from another person to put on and taking off regular upper body clothing?: A Lot Help from another person to  put on and taking off regular lower body clothing?: A Lot 6 Click Score: 15   End of Session Nurse Communication: Other (comment) (o2)  Activity Tolerance: Patient limited by pain Patient left: in bed;with call bell/phone within reach;with bed alarm set  OT Visit Diagnosis: Unsteadiness on feet (R26.81);Muscle weakness (generalized) (M62.81);Pain Pain - Right/Left: Left Pain -  part of body: Shoulder                Time: 2774-1287 OT Time Calculation (min): 20 min Charges:  OT General Charges $OT Visit: 1 Visit OT Evaluation $OT Eval Low Complexity: 1 Low  Alphia Moh OTR/L  Acute Rehab Services  [phone] office number [phone] pager number   Alphia Moh 09/10/2020, 1:39 PM

## 2020-09-10 NOTE — Progress Notes (Signed)
Subjective:    Patient is alert, sitting up in bed. States pain at left shoulder is better today but continues to be very painful. At first he states that he didn't remember aspiration yesterday, later on he states that he did remember.   Objective:  PE: VITALS:   Vitals:   09/10/20 0400 09/10/20 0628 09/10/20 0722 09/10/20 1116  BP: (!) 183/72 (!) 153/60 136/83 (!) 143/66  Pulse: 71 65 69 (!) 59  Resp: 16 14 18 17   Temp: 97.8 F (36.6 C)  98.4 F (36.9 C) 98.4 F (36.9 C)  TempSrc: Oral  Oral Oral  SpO2: 98% 94% 99% (!) 16%  Weight:      Height:       MUSCULOSKELETAL: Left shoulder has no active motion. Large effusion.  Full ROM left wrist, hand, and fingers. Endorses severe pain when attempting movement. Moderate warmth, no erythema over left shoulder.    LABS  Results for orders placed or performed during the hospital encounter of 09/09/20 (from the past 24 hour(s))  Urinalysis, Routine w reflex microscopic Urine, Clean Catch     Status: Abnormal   Collection Time: 09/09/20  4:40 PM  Result Value Ref Range   Color, Urine AMBER (A) YELLOW   APPearance HAZY (A) CLEAR   Specific Gravity, Urine 1.023 1.005 - 1.030   pH 5.0 5.0 - 8.0   Glucose, UA NEGATIVE NEGATIVE mg/dL   Hgb urine dipstick NEGATIVE NEGATIVE   Bilirubin Urine NEGATIVE NEGATIVE   Ketones, ur NEGATIVE NEGATIVE mg/dL   Protein, ur 11/09/20 (A) NEGATIVE mg/dL   Nitrite NEGATIVE NEGATIVE   Leukocytes,Ua NEGATIVE NEGATIVE   RBC / HPF 0-5 0 - 5 RBC/hpf   WBC, UA 11-20 0 - 5 WBC/hpf   Bacteria, UA NONE SEEN NONE SEEN   Squamous Epithelial / LPF 0-5 0 - 5   Mucus PRESENT   Synovial cell count + diff, w/ crystals     Status: Abnormal   Collection Time: 09/09/20  5:01 PM  Result Value Ref Range   Color, Synovial YELLOW (A) YELLOW   Appearance-Synovial CLOUDY (A) CLEAR   Crystals, Fluid INTRACELLULAR CALCIUM PYROPHOSPHATE CRYSTALS    WBC, Synovial 12,000 (H) 0 - 200 /cu mm   Neutrophil, Synovial 86 (H) 0  - 25 %   Lymphocytes-Synovial Fld 0 0 - 20 %   Monocyte-Macrophage-Synovial Fluid 14 (L) 50 - 90 %   Eosinophils-Synovial 0 0 - 1 %  Body fluid culture w Gram Stain     Status: None (Preliminary result)   Collection Time: 09/09/20  5:07 PM   Specimen: Body Fluid  Result Value Ref Range   Specimen Description FLUID    Special Requests JOINT LEFT SHOULDER    Gram Stain      ABUNDANT WBC PRESENT, PREDOMINANTLY PMN NO ORGANISMS SEEN    Culture      NO GROWTH < 24 HOURS Performed at [facility], [address] [place], [place]    Report Status PENDING   Magnesium     Status: None   Collection Time: 09/09/20  5:57 PM  Result Value Ref Range   Magnesium 2.0 1.7 - 2.4 mg/dL  Glucose, capillary     Status: Abnormal   Collection Time: 09/09/20  8:10 PM  Result Value Ref Range   Glucose-Capillary 106 (H) 70 - 99 mg/dL  Basic metabolic panel     Status: Abnormal   Collection Time: 09/10/20  3:01 AM  Result Value Ref Range   Sodium 140 135 - 145 mmol/L   Potassium 2.6 (LL) 3.5 - 5.1 mmol/L   Chloride 100 98 - 111 mmol/L   CO2 31 22 - 32 mmol/L   Glucose, Bld 62 (L) 70 - 99 mg/dL   BUN 10 8 - 23 mg/dL   Creatinine, Ser 2.50 (L) 0.61 - 1.24 mg/dL   Calcium 8.4 (L) 8.9 - 10.3 mg/dL   GFR, Estimated >53 >97 mL/min   Anion gap 9 5 - 15  CBC     Status: Abnormal   Collection Time: 09/10/20  3:01 AM  Result Value Ref Range   WBC 13.2 (H) 4.0 - 10.5 K/uL   RBC 3.93 (L) 4.22 - 5.81 MIL/uL   Hemoglobin 11.6 (L) 13.0 - 17.0 g/dL   HCT 67.3 (L) 41.9 - 37.9 %   MCV 95.7 80.0 - 100.0 fL   MCH 29.5 26.0 - 34.0 pg   MCHC 30.9 30.0 - 36.0 g/dL   RDW 02.4 09.7 - 35.3 %   Platelets 317 150 - 400 K/uL   nRBC 0.0 0.0 - 0.2 %  Protime-INR     Status: Abnormal   Collection Time: 09/10/20  3:01 AM  Result Value Ref Range   Prothrombin Time 16.6 (H) 11.4 - 15.2 seconds   INR 1.3 (H) 0.8 - 1.2  Magnesium     Status: None   Collection Time: 09/10/20  3:01 AM  Result Value Ref  Range   Magnesium 2.1 1.7 - 2.4 mg/dL  C-reactive protein     Status: Abnormal   Collection Time: 09/10/20  3:01 AM  Result Value Ref Range   CRP 29.4 (H) <1.0 mg/dL  Brain natriuretic peptide     Status: Abnormal   Collection Time: 09/10/20  3:01 AM  Result Value Ref Range   B Natriuretic Peptide 180.1 (H) 0.0 - 100.0 pg/mL  Procalcitonin - Baseline     Status: None   Collection Time: 09/10/20  3:01 AM  Result Value Ref Range   Procalcitonin 0.66 ng/mL  Glucose, capillary     Status: Abnormal   Collection Time: 09/10/20  5:56 AM  Result Value Ref Range   Glucose-Capillary 108 (H) 70 - 99 mg/dL  Glucose, capillary     Status: Abnormal   Collection Time: 09/10/20  7:25 AM  Result Value Ref Range   Glucose-Capillary 100 (H) 70 - 99 mg/dL  MRSA Next Gen by PCR, Nasal     Status: Abnormal   Collection Time: 09/10/20  7:40 AM   Specimen: Nasal Mucosa; Nasal Swab  Result Value Ref Range   MRSA by PCR Next Gen DETECTED (A) NOT DETECTED  Glucose, capillary     Status: Abnormal   Collection Time: 09/10/20 11:14 AM  Result Value Ref Range   Glucose-Capillary 111 (H) 70 - 99 mg/dL  Potassium     Status: None   Collection Time: 09/10/20 12:52 PM  Result Value Ref Range   Potassium 4.0 3.5 - 5.1 mmol/L  Uric acid     Status: Abnormal   Collection Time: 09/10/20 12:52 PM  Result Value Ref Range   Uric Acid, Serum 3.6 (L) 3.7 - 8.6 mg/dL    CT Head Wo Contrast  Result Date: 09/09/2020 CLINICAL DATA:  Mental status changes. Unknown cause. Patient found in to the left. EXAM: CT HEAD WITHOUT CONTRAST TECHNIQUE: Contiguous axial images were obtained from the base of the skull through the vertex without intravenous contrast. COMPARISON:  CT head without contrast 06/14/2020 and 06/06/2020 FINDINGS: Brain: No acute infarct, hemorrhage, or mass lesion is present. Moderate atrophy and white matter changes are stable. The ventricles are proportionate to the degree of atrophy. No significant  extraaxial fluid collection is present. The brainstem and cerebellum are within normal limits. Calcified lesion along a left frontal convexity on image 47 of series 3 is stable, consistent with a small meningioma. Vascular: Cirrhotic changes within the cavernous internal carotid arteries are stable. No hyperdense vessel is present. Skull: Calvarium is intact. No focal lytic or blastic lesions are present. No significant extracranial soft tissue lesion is present. Sinuses/Orbits: The paranasal sinuses and mastoid air cells are clear. Bilateral lens replacements are noted. Globes and orbits are otherwise unremarkable. IMPRESSION: 1. No acute intracranial abnormality or significant interval change. 2. Stable atrophy and white matter disease. This likely reflects the sequela of chronic microvascular ischemia. 3. Stable small left frontal convexity meningioma. Smaller areas of calcification likely represent dural ossification. Small meningiomas not excluded. Electronically Signed   By: Marin Robertshristopher  Mattern M.D.   On: 09/09/2020 13:30   DG CHEST PORT 1 VIEW  Result Date: 09/10/2020 CLINICAL DATA:  Cough and shortness of breath. EXAM: PORTABLE CHEST 1 VIEW COMPARISON:  Chest x-ray from yesterday. FINDINGS: Stable cardiomediastinal silhouette. Unchanged retrocardiac left lower lobe consolidation. Improved aeration at the right lung base. No pleural effusion or pneumothorax. No acute osseous abnormality. IMPRESSION: 1. Unchanged left lower lobe pneumonia. Electronically Signed   By: Obie DredgeWilliam T Derry M.D.   On: 09/10/2020 08:09   DG Chest Port 1 View  Result Date: 09/09/2020 CLINICAL DATA:  Mental status changes and hypoxia. EXAM: PORTABLE CHEST 1 VIEW COMPARISON:  06/14/2020 FINDINGS: Stable mild cardiac enlargement, aortic atherosclerosis and tortuosity of the thoracic aorta. Since the prior study, development asymmetric pulmonary airspace opacity in the left lower lung zone suspicious for pneumonia. There also is  subtle increased opacity at the right lung base consistent with atelectasis or infiltrate. No overt edema or significant pleural effusions. No pneumothorax. IMPRESSION: Development of asymmetric opacity in the left lower lung zone suspicious for acute pneumonia. Mild right basilar atelectasis versus infiltrate. Electronically Signed   By: Irish LackGlenn  Yamagata M.D.   On: 09/09/2020 12:09   DG Shoulder Left  Result Date: 09/09/2020 CLINICAL DATA:  Left shoulder pain.  No known trauma. EXAM: LEFT SHOULDER - 2+ VIEW COMPARISON:  Left shoulder radiographs 07/09/2014 FINDINGS: Progressive advanced degenerative changes are noted in the left shoulder. No acute or healing fractures are present. Heart is enlarged. Atherosclerotic changes are noted at the arch. IMPRESSION: 1. Progressive advanced degenerative changes of the left shoulder. 2. No acute abnormality. Electronically Signed   By: Marin Robertshristopher  Mattern M.D.   On: 09/09/2020 12:12    Assessment/Plan:  L shoulder psuedogout with coexisting rotator cuff arthropathy: - aspirate taken yesterday shows intracellular calcium pyrophosphatate crystals, cultures so far negative but will continue to watch - will plan for Left shoulder aspiration and injection today for improvement in symptoms  Preprocedure diagnosis: Left shoulder psuedogout Post procedure diagnosis: Same Procedure: Left shoulder aspiration Procedure details: After informed verbal consent was obtained the left anterior shoulder was prepped with chlorhexidine and an 18-gauge needle was used to aspirate 60 mL of yellow appears slightly turbid joint fluid. Patient likely had more than could have been aspirated, but he asked me to stop aspirating due to pain.  2 ml of Marcaine and 1 ml of Depo-Medrol (40mg ) was then injected. A Band-Aid was placed and  then subsequent reinforcing gauze was also applied.  He tolerated this well, slept through most of the procedure until asking me to discontinue the  aspiration.    Contact information:   Weekdays 8-5 [address], [place] [phone] A fter hours and holidays please check [email or web address] for group call information for [facility]  Armida Sans 09/10/2020, 2:42 PM

## 2020-09-10 NOTE — Progress Notes (Signed)
Pt unable to go to MRI now due to loss of both IV's. Unable to obtain by floor staff, IV team consulted.   Once IV access is obtained, will complete doxycycline and IV potassium x2, and then notify MD for 1x order for antianxiety medication prior to calling MRI back.

## 2020-09-10 NOTE — Progress Notes (Signed)
PROGRESS NOTE                                                                                                                                                                                                             Patient Demographics:    Mark SeverinWilfred Mansfield, is a 85 y.o. male, DOB - 09/09/1935, WUJ:[id number]  Outpatient Primary MD for the patient is Maurice SmallGriffin, Elaine, MD    LOS - 1  Admit date - 09/09/2020    Chief Complaint  Patient presents with   Altered Mental Status       Brief Narrative (HPI from H&P)   Mark Russell is a 85 y.o. male with medical history significant of  PVD, DVT on anticoagulation, a fib, HTN, DM2, COPD, OSA, HLD who presented to ED for AMS and concerns for stroke by family.  His ER work-up was suggestive of community-acquired pneumonia along with left shoulder inflammation and he was admitted to the hospital.  Head CT was unremarkable he had no focal deficits.   Subjective:    Mark SeverinWilfred Guilliams today has, No headache, No chest pain, No abdominal pain - No Nausea, No new weakness tingling or numbness, no shortness of breath, he underwent left shoulder pain small.   Assessment  & Plan :    1. CAP causing Sepsis and toxic encephalopathy and precipitation of pseudogout - agree with empiric IV antibiotics which should be continued, MRSA nasal PCR is positive, continue broad-spectrum antibiotics until cultures are back and then taper down.  2.  Left shoulder pain.  Seen by orthopedics, underwent arthrocentesis on 09/09/2020 fluid so far suggestive of pseudogout related inflammation, not consistent with infectious arthritis.  Have added colchicine to reduce inflammation, he will benefit from intra-articular steroid shot, hesitant to give systemic steroids due to his diabetes mellitus.  3.  Toxic encephalopathy.  Resolved.  Head CT negative no focal deficits caused by sepsis and inflammation from pseudogout in the  left shoulder joint.  4.  Hypokalemia.  Replaced.  5.  Bilateral BKA.  Homebound lives on a wheelchair with close supervision from family, family would prefer taking him back home if possible.  6.  Acute hypoxic respiratory failure.  Due to pneumonia resolved.  7.  Morbid obesity with OSA.  BMI of 40.  Not on CPAP at home, with family, follow with PCP for  weight loss.  8.  Dyslipidemia.  On statin.  9.  History of COPD.  Supportive care no acute issues.  10.  PAD.  On treatment within an statin for secondary prevention.  11.  Paroxysmal atrial fibrillation.  [place] vas 2 score of greater than 3.  On beta-blocker and Coumadin combination.  Pharmacy monitoring INR.  Lovenox till INR is therapeutic.  12. HTN - On Norvasc + B Blocker.   13. Low B12 - SQ replacement x 7 days then PO.  14. Chronic Pain - Home Rx.  15. BPH - Flomax  16.  Mild cognitive decline.  On Aricept, at risk for delirium.  Monitor.    17. DM 2 - ISS  Lab Results  Component Value Date   HGBA1C 6.5 (H) 06/15/2020    CBG (last 3)  Recent Labs    09/10/20 0556 09/10/20 0725 09/10/20 1114  GLUCAP 108* 100* 111*         Condition - Guarded  Family Communication  :  daughter Asher Muir [phone] 09/10/20  Code Status :  Full  Consults  :  Ortho  PUD Prophylaxis :   Procedures  :     L.  Shoulder arthrocentesis consistent with pseudogout.      Disposition Plan  :    Status is: Inpatient  Remains inpatient appropriate because:IV treatments appropriate due to intensity of illness or inability to take PO  Dispo: The patient is from: Home              Anticipated d/c is to: Home              Patient currently is not medically stable to d/c.   Difficult to place patient No   DVT Prophylaxis  :     warfarin (COUMADIN) tablet 6 mg      Lab Results  Component Value Date   PLT 317 09/10/2020    Diet :  Diet Order             Diet heart healthy/carb modified Room service appropriate?  Yes; Fluid consistency: Thin  Diet effective now                    Inpatient Medications  Scheduled Meds:  amLODipine  10 mg Oral Daily   Chlorhexidine Gluconate Cloth  6 each Topical Q0600   cholecalciferol  2,000 Units Oral Daily   donepezil  10 mg Oral QHS   DULoxetine  30 mg Oral Daily   enoxaparin (LOVENOX) injection  80 mg Subcutaneous Q12H   [START ON 09/11/2020] fentaNYL  1 patch Transdermal Q72H   FLUoxetine  40 mg Oral Daily   gabapentin  300 mg Oral TID   insulin aspart  0-9 Units Subcutaneous TID WC   metoprolol tartrate  50 mg Oral BID   mupirocin ointment  1 application Nasal BID   simvastatin  40 mg Oral QPM   sodium chloride flush  3 mL Intravenous Q12H   tamsulosin  0.4 mg Oral Daily   warfarin  6 mg Oral ONCE-1600   Warfarin - Pharmacist Dosing Inpatient   Does not apply q1600   Continuous Infusions:  sodium chloride     ceFEPime (MAXIPIME) IV     doxycycline (VIBRAMYCIN) IV 100 mg (09/10/20 1035)   potassium chloride 10 mEq (09/10/20 0934)   vancomycin     PRN Meds:.sodium chloride, acetaminophen **OR** acetaminophen, albuterol, guaiFENesin, oxyCODONE-acetaminophen, sodium chloride flush  Antibiotics  :    Anti-infectives (  From admission, onward)    Start     Dose/Rate Route Frequency Ordered Stop   09/10/20 2200  ceFEPIme (MAXIPIME) 2 g in sodium chloride 0.9 % 100 mL IVPB        2 g 200 mL/hr over 30 Minutes Intravenous Every 12 hours 09/10/20 0830     09/10/20 1700  vancomycin (VANCOREADY) IVPB 750 mg/150 mL        750 mg 150 mL/hr over 60 Minutes Intravenous Every 24 hours 09/09/20 1608 09/14/20 1659   09/10/20 1500  cefTRIAXone (ROCEPHIN) 2 g in sodium chloride 0.9 % 100 mL IVPB  Status:  Discontinued       Note to Pharmacy: 24 hours from first dose pleaes   2 g 200 mL/hr over 30 Minutes Intravenous Every 24 hours 09/09/20 1421 09/09/20 1510   09/10/20 1000  azithromycin (ZITHROMAX) 500 mg in sodium chloride 0.9 % 250 mL IVPB  Status:   Discontinued        500 mg 250 mL/hr over 60 Minutes Intravenous Every 24 hours 09/09/20 1421 09/09/20 1434   09/09/20 2200  doxycycline (VIBRAMYCIN) 100 mg in sodium chloride 0.9 % 250 mL IVPB        100 mg 125 mL/hr over 120 Minutes Intravenous Every 12 hours 09/09/20 1435 09/14/20 0959   09/09/20 1600  ceFEPIme (MAXIPIME) 2 g in sodium chloride 0.9 % 100 mL IVPB  Status:  Discontinued        2 g 200 mL/hr over 30 Minutes Intravenous Every 8 hours 09/09/20 1524 09/10/20 0830   09/09/20 1500  vancomycin (VANCOREADY) IVPB 1750 mg/350 mL        1,750 mg 175 mL/hr over 120 Minutes Intravenous  Once 09/09/20 1453 09/09/20 2219   09/09/20 1445  cefTRIAXone (ROCEPHIN) 1 g in sodium chloride 0.9 % 100 mL IVPB  Status:  Discontinued        1 g 200 mL/hr over 30 Minutes Intravenous NOW 09/09/20 1432 09/09/20 1524   09/09/20 1230  cefTRIAXone (ROCEPHIN) 1 g in sodium chloride 0.9 % 100 mL IVPB        1 g 200 mL/hr over 30 Minutes Intravenous  Once 09/09/20 1227 09/09/20 1345   09/09/20 1230  doxycycline (VIBRAMYCIN) 100 mg in sodium chloride 0.9 % 250 mL IVPB        100 mg 125 mL/hr over 120 Minutes Intravenous  Once 09/09/20 1227 09/09/20 1716        Time Spent in minutes  30   Susa Raring M.D on 09/10/2020 at 11:58 AM  To page go to [email or web address]   [facility] -  Office  [phone]      See all Orders from today for further details    Objective:   Vitals:   09/10/20 0400 09/10/20 0628 09/10/20 0722 09/10/20 1116  BP: (!) 183/72 (!) 153/60 136/83 (!) 143/66  Pulse: 71 65 69 (!) 59  Resp: 16 14 18 17   Temp: 97.8 F (36.6 C)  98.4 F (36.9 C) 98.4 F (36.9 C)  TempSrc: Oral  Oral Oral  SpO2: 98% 94% 99% (!) 16%  Weight:      Height:        Wt Readings from Last 3 Encounters:  09/09/20 81.8 kg  06/14/20 81.8 kg  06/06/20 89.5 kg     Intake/Output Summary (Last 24 hours) at 09/10/2020 1158 Last data filed at 09/10/2020 1003 Gross per 24 hour  Intake 689.9  ml  Output --  Net 689.9  ml     Physical Exam  Awake Alert, No new F.N deficits, Normal affect [place].AT,PERRAL Supple Neck,No JVD, No cervical lymphadenopathy appriciated.  Symmetrical Chest wall movement, Good air movement bilaterally, CTAB RRR,No Gallops,Rubs or new Murmurs, No Parasternal Heave +ve B.Sounds, Abd Soft, No tenderness, No organomegaly appriciated, No rebound - guarding or rigidity. Bilat BKA, L shoulder mildly tender     Data Review:    CBC Recent Labs  Lab 09/09/20 1208 09/10/20 0301  WBC 18.6* 13.2*  HGB 12.6* 11.6*  HCT 41.0 37.6*  PLT 346 317  MCV 97.4 95.7  MCH 29.9 29.5  MCHC 30.7 30.9  RDW 15.0 14.7  LYMPHSABS 0.5*  --   MONOABS 1.6*  --   EOSABS 0.0  --   BASOSABS 0.1  --     Recent Labs  Lab 09/09/20 1208 09/09/20 1757 09/10/20 0301  NA 138  --  140  K 3.1*  --  2.6*  CL 97*  --  100  CO2 30  --  31  GLUCOSE 139*  --  62*  BUN 7*  --  10  CREATININE 0.77  --  0.58*  CALCIUM 8.6*  --  8.4*  AST 14*  --   --   ALT 11  --   --   ALKPHOS 48  --   --   BILITOT 1.2  --   --   ALBUMIN 2.8*  --   --   MG  --  2.0 2.1  CRP 26.0*  --  29.4*  DDIMER 1.87*  --   --   PROCALCITON  --   --  0.66  LATICACIDVEN 1.5  --   --   INR 1.3*  --  1.3*  TSH 0.169*  --   --   BNP 224.5*  --  180.1*    ------------------------------------------------------------------------------------------------------------------ No results for input(s): CHOL, HDL, LDLCALC, TRIG, CHOLHDL, LDLDIRECT in the last 72 hours.  Lab Results  Component Value Date   HGBA1C 6.5 (H) 06/15/2020   ------------------------------------------------------------------------------------------------------------------ Recent Labs    09/09/20 1208  TSH 0.169*    Cardiac Enzymes No results for input(s): CKMB, TROPONINI, MYOGLOBIN in the last 168 hours.  Invalid input(s):  CK ------------------------------------------------------------------------------------------------------------------    Component Value Date/Time   BNP 180.1 (H) 09/10/2020 0301     Radiology Reports CT Head Wo Contrast  Result Date: 09/09/2020 CLINICAL DATA:  Mental status changes. Unknown cause. Patient found in to the left. EXAM: CT HEAD WITHOUT CONTRAST TECHNIQUE: Contiguous axial images were obtained from the base of the skull through the vertex without intravenous contrast. COMPARISON:  CT head without contrast 06/14/2020 and 06/06/2020 FINDINGS: Brain: No acute infarct, hemorrhage, or mass lesion is present. Moderate atrophy and white matter changes are stable. The ventricles are proportionate to the degree of atrophy. No significant extraaxial fluid collection is present. The brainstem and cerebellum are within normal limits. Calcified lesion along a left frontal convexity on image 47 of series 3 is stable, consistent with a small meningioma. Vascular: Cirrhotic changes within the cavernous internal carotid arteries are stable. No hyperdense vessel is present. Skull: Calvarium is intact. No focal lytic or blastic lesions are present. No significant extracranial soft tissue lesion is present. Sinuses/Orbits: The paranasal sinuses and mastoid air cells are clear. Bilateral lens replacements are noted. Globes and orbits are otherwise unremarkable. IMPRESSION: 1. No acute intracranial abnormality or significant interval change. 2. Stable atrophy and white matter disease. This likely reflects the sequela of chronic  microvascular ischemia. 3. Stable small left frontal convexity meningioma. Smaller areas of calcification likely represent dural ossification. Small meningiomas not excluded. Electronically Signed   By: Marin Roberts M.D.   On: 09/09/2020 13:30   DG CHEST PORT 1 VIEW  Result Date: 09/10/2020 CLINICAL DATA:  Cough and shortness of breath. EXAM: PORTABLE CHEST 1 VIEW COMPARISON:   Chest x-ray from yesterday. FINDINGS: Stable cardiomediastinal silhouette. Unchanged retrocardiac left lower lobe consolidation. Improved aeration at the right lung base. No pleural effusion or pneumothorax. No acute osseous abnormality. IMPRESSION: 1. Unchanged left lower lobe pneumonia. Electronically Signed   By: Obie Dredge M.D.   On: 09/10/2020 08:09   DG Chest Port 1 View  Result Date: 09/09/2020 CLINICAL DATA:  Mental status changes and hypoxia. EXAM: PORTABLE CHEST 1 VIEW COMPARISON:  06/14/2020 FINDINGS: Stable mild cardiac enlargement, aortic atherosclerosis and tortuosity of the thoracic aorta. Since the prior study, development asymmetric pulmonary airspace opacity in the left lower lung zone suspicious for pneumonia. There also is subtle increased opacity at the right lung base consistent with atelectasis or infiltrate. No overt edema or significant pleural effusions. No pneumothorax. IMPRESSION: Development of asymmetric opacity in the left lower lung zone suspicious for acute pneumonia. Mild right basilar atelectasis versus infiltrate. Electronically Signed   By: Irish Lack M.D.   On: 09/09/2020 12:09   DG Shoulder Left  Result Date: 09/09/2020 CLINICAL DATA:  Left shoulder pain.  No known trauma. EXAM: LEFT SHOULDER - 2+ VIEW COMPARISON:  Left shoulder radiographs 07/09/2014 FINDINGS: Progressive advanced degenerative changes are noted in the left shoulder. No acute or healing fractures are present. Heart is enlarged. Atherosclerotic changes are noted at the arch. IMPRESSION: 1. Progressive advanced degenerative changes of the left shoulder. 2. No acute abnormality. Electronically Signed   By: Marin Roberts M.D.   On: 09/09/2020 12:12

## 2020-09-10 NOTE — Evaluation (Signed)
Physical Therapy Evaluation Patient Details Name: Mark Russell MRN: [id number] DOB: 06/24/1935 Today's Date: 09/10/2020   History of Present Illness  85 y.o. male presented to ED 09/09/20 for AMS and concerns for stroke. +pneumonia, sepsis, Lt shoulder pain (?septic arthritis with MRI pending); ortho consulted with left shoulder aspirated 09/09/20  PMH significant of  PVD, DVT on anticoagulation, a fib, HTN, DM2, COPD, OSA, HLD, bil AKA amputations  Clinical Impression   Pt admitted secondary to problem above with deficits below. PTA patient was modified independent with all mobility using lateral/scoot transfers between surfaces (and wheelchair lift into his Zenaida Niece). Pt currently requires +2 min assist for posterior transfer along mattress and unable to tolerate lateral scoot due to degree of left shoulder pain.  Anticipate patient will benefit from PT to address problems listed below. Will continue to follow acutely to maximize functional mobility independence and safety. IF family can provide min assist for transfers, then pt can go home. If not, will need SNF (would not be able to participate in 3 hrs intensive rehab due to degree of shoulder pain).      Follow Up Recommendations Home health PT;Supervision - Intermittent (if pt's family can provide min assist for transfers)    Equipment Recommendations  None recommended by PT    Recommendations for Other Services       Precautions / Restrictions Precautions Precautions: Fall Restrictions Weight Bearing Restrictions: No      Mobility  Bed Mobility Overal bed mobility: Needs Assistance Bed Mobility: Rolling;Sidelying to Sit;Sit to Supine;Supine to Sit Rolling: Min assist (with rail) Sidelying to sit: Mod assist;+2 for physical assistance;HOB elevated (with rail) Supine to sit: Mod assist;HOB elevated Sit to supine: Min assist;HOB elevated   General bed mobility comments: pt normally exits bed to his left and attempted that today;  incr left shoulder pain made it difficult for him to "swing up" the way he normally does    Transfers Overall transfer level: Needs assistance Equipment used:  (bed pad under hips) Transfers: Licensed conveyancer transfers: Min assist;+2 physical assistance   General transfer comment: attempted lateral scoot and pt unable to take enough pressure on his left arm to scoot; able to posteriorly scoot/walk on his hips toward HOB with mild incr dyspnea and needing assist with bed pad +2 to complete task  Ambulation/Gait             General Gait Details: pt uses Patent attorney Rankin (Stroke Patients Only)       Balance Overall balance assessment: Needs assistance Sitting-balance support: No upper extremity supported Sitting balance-Leahy Scale: Good Sitting balance - Comments: can weight shift without use of UEs                                     Pertinent Vitals/Pain Pain Assessment: Faces Faces Pain Scale: Hurts whole lot Pain Location: left shoulder Pain Descriptors / Indicators: Grimacing;Guarding;Sharp Pain Intervention(s): Limited activity within patient's tolerance;Monitored during session;Repositioned    Home Living Family/patient expects to be discharged to:: Private residence Living Arrangements: Children (son) Available Help at Discharge: Family Type of Home: House Home Access: Ramped entrance     Home Layout: One level Home Equipment: Tub bench;Wheelchair - power      Prior Function Level of Independence: Needs  assistance   Gait / Transfers Assistance Needed: independent with lateral transfers (no longer uses sliding board bed to chair)  ADL's / Homemaking Assistance Needed: Reports son helps with laundry, IADLs; does have wheelchair accessible van        Hand Dominance   Dominant Hand: Right    Extremity/Trunk Assessment   Upper Extremity  Assessment Upper Extremity Assessment: Defer to OT evaluation    Lower Extremity Assessment Lower Extremity Assessment:  (bil AKA; able to fully flex (hip extension to neutral with bed flat))    Cervical / Trunk Assessment Cervical / Trunk Assessment: Other exceptions Cervical / Trunk Exceptions: overweight  Communication   Communication: No difficulties  Cognition Arousal/Alertness: Awake/alert Behavior During Therapy: WFL for tasks assessed/performed Overall Cognitive Status: No family/caregiver present to determine baseline cognitive functioning Area of Impairment: Safety/judgement;Awareness;Problem solving                         Safety/Judgement: Decreased awareness of deficits Awareness: Intellectual Problem Solving: Slow processing;Difficulty sequencing;Requires verbal cues;Requires tactile cues General Comments: patient not comprehending that he will not be able to do transfers and bed mobility the way he has been doing now that he has severe left shoulder pain; at times has difficulty understanding the question being asked      General Comments      Exercises     Assessment/Plan    PT Assessment Patient needs continued PT services  PT Problem List Decreased range of motion;Decreased activity tolerance;Decreased mobility;Decreased cognition;Decreased safety awareness;Obesity;Pain       PT Treatment Interventions DME instruction;Functional mobility training;Therapeutic activities;Therapeutic exercise;Balance training;Cognitive remediation;Patient/family education;Wheelchair mobility training    PT Goals (Current goals can be found in the Care Plan section)  Acute Rehab PT Goals Patient Stated Goal: get shoulder better and go home PT Goal Formulation: With patient Time For Goal Achievement: 09/24/20 Potential to Achieve Goals: Good    Frequency Min 3X/week   Barriers to discharge        Co-evaluation               AM-PAC PT "6 Clicks"  Mobility  Outcome Measure Help needed turning from your back to your side while in a flat bed without using bedrails?: A Little Help needed moving from lying on your back to sitting on the side of a flat bed without using bedrails?: Total Help needed moving to and from a bed to a chair (including a wheelchair)?: Total Help needed standing up from a chair using your arms (e.g., wheelchair or bedside chair)?: Total Help needed to walk in hospital room?: Total Help needed climbing 3-5 steps with a railing? : Total 6 Click Score: 8    End of Session Equipment Utilized During Treatment:  (on room air with sats 95-97%) Activity Tolerance: Patient limited by pain Patient left: in bed;with call bell/phone within reach;Other (comment) (with lab personnel) Nurse Communication: Other (comment) (did well on room air) PT Visit Diagnosis: Pain Pain - Right/Left: Left Pain - part of body: Shoulder    Time: 2119-4174 PT Time Calculation (min) (ACUTE ONLY): 36 min   Charges:   PT Evaluation $PT Eval Moderate Complexity: 1 Mod           Jerolyn Center, PT Pager [phone]   Zena Amos 09/10/2020, 1:10 PM

## 2020-09-10 NOTE — Progress Notes (Signed)
Pt pulled out freshly placed IV, stating it was because his IV pump started beeping.   I have notified MRI that once again, we will need to postpone due to no access. I have placed an IV team consult. Ativan x1 is ordered and ready for patient prior to MRI.

## 2020-09-11 DIAGNOSIS — M25512 Pain in left shoulder: Secondary | ICD-10-CM

## 2020-09-11 DIAGNOSIS — G8929 Other chronic pain: Secondary | ICD-10-CM

## 2020-09-11 DIAGNOSIS — M11212 Other chondrocalcinosis, left shoulder: Secondary | ICD-10-CM

## 2020-09-11 LAB — COMPREHENSIVE METABOLIC PANEL
ALT: 13 U/L (ref 0–44)
AST: 13 U/L — ABNORMAL LOW (ref 15–41)
Albumin: 2.8 g/dL — ABNORMAL LOW (ref 3.5–5.0)
Alkaline Phosphatase: 56 U/L (ref 38–126)
Anion gap: 10 (ref 5–15)
BUN: 12 mg/dL (ref 8–23)
CO2: 28 mmol/L (ref 22–32)
Calcium: 9.2 mg/dL (ref 8.9–10.3)
Chloride: 100 mmol/L (ref 98–111)
Creatinine, Ser: 0.59 mg/dL — ABNORMAL LOW (ref 0.61–1.24)
GFR, Estimated: >60 mL/min (ref >60)
Glucose, Bld: 158 mg/dL — ABNORMAL HIGH (ref 70–99)
Potassium: 4.4 mmol/L (ref 3.5–5.1)
Sodium: 138 mmol/L (ref 135–145)
Total Bilirubin: 0.9 mg/dL (ref 0.3–1.2)
Total Protein: 7.5 g/dL (ref 6.5–8.1)

## 2020-09-11 LAB — CBC WITH DIFFERENTIAL/PLATELET
Abs Immature Granulocytes: 0.06 10*3/uL (ref 0.00–0.07)
Basophils Absolute: 0 10*3/uL (ref 0.0–0.1)
Basophils Relative: 0 %
Eosinophils Absolute: 0 10*3/uL (ref 0.0–0.5)
Eosinophils Relative: 0 %
HCT: 44.6 % (ref 39.0–52.0)
Hemoglobin: 13.8 g/dL (ref 13.0–17.0)
Immature Granulocytes: 1 %
Lymphocytes Relative: 5 %
Lymphs Abs: 0.5 10*3/uL — ABNORMAL LOW (ref 0.7–4.0)
MCH: 29.4 pg (ref 26.0–34.0)
MCHC: 30.9 g/dL (ref 30.0–36.0)
MCV: 95.1 fL (ref 80.0–100.0)
Monocytes Absolute: 0.4 10*3/uL (ref 0.1–1.0)
Monocytes Relative: 4 %
Neutro Abs: 9.6 10*3/uL — ABNORMAL HIGH (ref 1.7–7.7)
Neutrophils Relative %: 90 %
Platelets: 391 10*3/uL (ref 150–400)
RBC: 4.69 MIL/uL (ref 4.22–5.81)
RDW: 14.7 % (ref 11.5–15.5)
WBC: 10.6 10*3/uL — ABNORMAL HIGH (ref 4.0–10.5)
nRBC: 0.2 % (ref 0.0–0.2)

## 2020-09-11 LAB — GLUCOSE, CAPILLARY
Glucose-Capillary: 147 mg/dL — ABNORMAL HIGH (ref 70–99)
Glucose-Capillary: 98 mg/dL (ref 70–99)
Glucose-Capillary: 115 mg/dL — ABNORMAL HIGH (ref 70–99)
Glucose-Capillary: 149 mg/dL — ABNORMAL HIGH (ref 70–99)

## 2020-09-11 LAB — BRAIN NATRIURETIC PEPTIDE: B Natriuretic Peptide: 372.5 pg/mL — ABNORMAL HIGH (ref 0.0–100.0)

## 2020-09-11 LAB — TSH: TSH: 0.515 u[IU]/mL (ref 0.350–4.500)

## 2020-09-11 LAB — LEGIONELLA PNEUMOPHILA SEROGP 1 UR AG: L. pneumophila Serogp 1 Ur Ag: NEGATIVE

## 2020-09-11 LAB — T4, FREE: Free T4: 0.95 ng/dL (ref 0.61–1.12)

## 2020-09-11 LAB — PROCALCITONIN: Procalcitonin: 0.38 ng/mL

## 2020-09-11 LAB — C-REACTIVE PROTEIN: CRP: 28.5 mg/dL — ABNORMAL HIGH (ref <1.0)

## 2020-09-11 LAB — PROTIME-INR
INR: 1.5 — ABNORMAL HIGH (ref 0.8–1.2)
Prothrombin Time: 17.8 seconds — ABNORMAL HIGH (ref 11.4–15.2)

## 2020-09-11 LAB — MAGNESIUM: Magnesium: 2.1 mg/dL (ref 1.7–2.4)

## 2020-09-11 MED ORDER — WARFARIN SODIUM 6 MG PO TABS
6.0000 mg | ORAL_TABLET | Freq: Once | ORAL | Status: AC
  Administered 2020-09-11: 6 mg via ORAL
  Filled 2020-09-11: qty 1

## 2020-09-11 MED ORDER — DOXYCYCLINE HYCLATE 100 MG PO TABS
100.0000 mg | ORAL_TABLET | Freq: Two times a day (BID) | ORAL | Status: DC
  Administered 2020-09-11: 100 mg via ORAL

## 2020-09-11 MED ORDER — DOXYCYCLINE HYCLATE 100 MG PO TABS
100.0000 mg | ORAL_TABLET | Freq: Two times a day (BID) | ORAL | Status: DC
  Administered 2020-09-12 – 2020-09-13 (×3): 100 mg via ORAL
  Filled 2020-09-11 (×4): qty 1

## 2020-09-11 MED ORDER — POLYETHYLENE GLYCOL 3350 17 G PO PACK
17.0000 g | PACK | Freq: Every day | ORAL | Status: DC
  Administered 2020-09-13: 17 g via ORAL
  Filled 2020-09-11: qty 1

## 2020-09-11 NOTE — Progress Notes (Signed)
PROGRESS NOTE                                                                                                                                                                                                             Patient Demographics:    Mark Russell, is a 85 y.o. male, DOB - 02-10-1935, WGY:[id number]  Outpatient Primary MD for the patient is Maurice Small, MD    LOS - 2  Admit date - 09/09/2020    Chief Complaint  Patient presents with   Altered Mental Status       Brief Narrative (HPI from H&P)   - Mark Russell is a 85 y.o. male with medical history significant of  PVD, DVT on anticoagulation, a fib, HTN, DM2, COPD, OSA, HLD who presented to ED for AMS and concerns for stroke by family.  His ER work-up was suggestive of community-acquired pneumonia along with left shoulder inflammation and he was admitted to the hospital.  Head CT was unremarkable he had no focal deficits.   Subjective:    Mark Russell today reports cough has improved, still reports some congestion , reports clear productive phlegm, reports left shoulder pain has improved.     Assessment  & Plan :   CAP causing Sepsis and toxic encephalopathy and precipitation of pseudogout  - agree with empiric IV antibiotics which should be continued, MRSA nasal PCR is positive, continue broad-spectrum antibiotics until cultures are back and then taper down.will change IV vancomycin to doxycycline tomorrow as long he continues to improve . -I have encouraged him to use incentive erectly and flutter valve.  Left shoulder pain.  Seen by orthopedics, underwent arthrocentesis on 09/09/2020 fluid so far suggestive of pseudogout related inflammation, not consistent with infectious arthritis.  Have added colchicine to reduce inflammation, he will benefit from intra-articular steroid shot, hesitant to give systemic steroids due to his diabetes mellitus.  Toxic  encephalopathy.  Resolved.  Head CT negative no focal deficits caused by sepsis and inflammation from pseudogout in the left shoulder joint.  Hypokalemia.  Replaced.  B12 deficiency -Started on IM supplements, transition to p.o. on discharge   Bilateral BKA.  Homebound lives on a wheelchair with close supervision from family, family would prefer taking him back home if possible.  Acute hypoxic respiratory failure.  Due to pneumonia resolved.  Morbid obesity  with OSA.  BMI of 40.  Not on CPAP at home, with family, follow with PCP for weight loss.  Dyslipidemia.  On statin.  History of COPD.  Supportive care no acute issues.  PAD.  On treatment within an statin for secondary prevention.  Paroxysmal atrial fibrillation.  [place] vas 2 score of greater than 3.  On beta-blocker and Coumadin combination.  Pharmacy monitoring INR.  Lovenox till INR is therapeutic.  HTN - On Norvasc + B Blocker.   Low B12 - SQ replacement x 7 days then PO.  Chronic Pain - Home Rx.  BPH - Flomax  Mild cognitive decline.  On Aricept, at risk for delirium.  Monitor.    DM 2 - ISS  Lab Results  Component Value Date   HGBA1C 6.5 (H) 06/15/2020    CBG (last 3)  Recent Labs    09/10/20 2040 09/11/20 0747 09/11/20 1140  GLUCAP 176* 147* 98         Condition - Guarded  Family Communication  : None at bedside  Code Status :  Full  Consults  :  Ortho  PUD Prophylaxis :   Procedures  :     L.  Shoulder arthrocentesis consistent with pseudogout.      Disposition Plan  :    Status is: Inpatient  Remains inpatient appropriate because:IV treatments appropriate due to intensity of illness or inability to take PO  Dispo: The patient is from: Home              Anticipated d/c is to: Home              Patient currently is not medically stable to d/c.   Difficult to place patient No   DVT Prophylaxis  :     warfarin (COUMADIN) tablet 6 mg      Lab Results  Component Value Date    PLT 391 09/11/2020    Diet :  Diet Order             Diet heart healthy/carb modified Room service appropriate? No; Fluid consistency: Thin  Diet effective now                    Inpatient Medications  Scheduled Meds:  amLODipine  10 mg Oral Daily   bupivacaine  10 mL Infiltration Once   Chlorhexidine Gluconate Cloth  6 each Topical Q0600   cholecalciferol  2,000 Units Oral Daily   colchicine  0.3 mg Oral Daily   cyanocobalamin  1,000 mcg Subcutaneous Daily   donepezil  10 mg Oral QHS   [START ON 09/12/2020] doxycycline  100 mg Oral Q12H   DULoxetine  30 mg Oral Daily   enoxaparin (LOVENOX) injection  80 mg Subcutaneous Q12H   fentaNYL  1 patch Transdermal Q72H   FLUoxetine  40 mg Oral Daily   gabapentin  300 mg Oral TID   insulin aspart  0-9 Units Subcutaneous TID WC   methylPREDNISolone acetate  40 mg Intra-articular Once   metoprolol tartrate  50 mg Oral BID   mupirocin ointment  1 application Nasal BID   polyethylene glycol  17 g Oral Daily   simvastatin  40 mg Oral QPM   sodium chloride flush  3 mL Intravenous Q12H   tamsulosin  0.4 mg Oral Daily   warfarin  6 mg Oral ONCE-1600   Warfarin - Pharmacist Dosing Inpatient   Does not apply q1600   Continuous Infusions:  sodium chloride  ceFEPime (MAXIPIME) IV 2 g (09/11/20 0826)   vancomycin 750 mg (09/10/20 2049)   PRN Meds:.sodium chloride, acetaminophen **OR** acetaminophen, albuterol, guaiFENesin, LORazepam, oxyCODONE-acetaminophen, sodium chloride flush  Antibiotics  :    Anti-infectives (From admission, onward)    Start     Dose/Rate Route Frequency Ordered Stop   09/12/20 1000  doxycycline (VIBRA-TABS) tablet 100 mg        100 mg Oral Every 12 hours 09/11/20 1117 09/14/20 2159   09/11/20 1000  doxycycline (VIBRA-TABS) tablet 100 mg  Status:  Discontinued        100 mg Oral Every 12 hours 09/11/20 0825 09/11/20 1117   09/10/20 2200  ceFEPIme (MAXIPIME) 2 g in sodium chloride 0.9 % 100 mL IVPB         2 g 200 mL/hr over 30 Minutes Intravenous Every 12 hours 09/10/20 0830 09/13/20 2359   09/10/20 1700  vancomycin (VANCOREADY) IVPB 750 mg/150 mL        750 mg 150 mL/hr over 60 Minutes Intravenous Every 24 hours 09/09/20 1608 09/11/20 2359   09/10/20 1500  cefTRIAXone (ROCEPHIN) 2 g in sodium chloride 0.9 % 100 mL IVPB  Status:  Discontinued       Note to Pharmacy: 24 hours from first dose pleaes   2 g 200 mL/hr over 30 Minutes Intravenous Every 24 hours 09/09/20 1421 09/09/20 1510   09/10/20 1000  azithromycin (ZITHROMAX) 500 mg in sodium chloride 0.9 % 250 mL IVPB  Status:  Discontinued        500 mg 250 mL/hr over 60 Minutes Intravenous Every 24 hours 09/09/20 1421 09/09/20 1434   09/09/20 2200  doxycycline (VIBRAMYCIN) 100 mg in sodium chloride 0.9 % 250 mL IVPB  Status:  Discontinued        100 mg 125 mL/hr over 120 Minutes Intravenous Every 12 hours 09/09/20 1435 09/11/20 0825   09/09/20 1600  ceFEPIme (MAXIPIME) 2 g in sodium chloride 0.9 % 100 mL IVPB  Status:  Discontinued        2 g 200 mL/hr over 30 Minutes Intravenous Every 8 hours 09/09/20 1524 09/10/20 0830   09/09/20 1500  vancomycin (VANCOREADY) IVPB 1750 mg/350 mL        1,750 mg 175 mL/hr over 120 Minutes Intravenous  Once 09/09/20 1453 09/09/20 2219   09/09/20 1445  cefTRIAXone (ROCEPHIN) 1 g in sodium chloride 0.9 % 100 mL IVPB  Status:  Discontinued        1 g 200 mL/hr over 30 Minutes Intravenous NOW 09/09/20 1432 09/09/20 1524   09/09/20 1230  cefTRIAXone (ROCEPHIN) 1 g in sodium chloride 0.9 % 100 mL IVPB        1 g 200 mL/hr over 30 Minutes Intravenous  Once 09/09/20 1227 09/09/20 1345   09/09/20 1230  doxycycline (VIBRAMYCIN) 100 mg in sodium chloride 0.9 % 250 mL IVPB        100 mg 125 mL/hr over 120 Minutes Intravenous  Once 09/09/20 1227 09/09/20 1716         Andrena Margerum M.D on 09/11/2020 at 3:31 PM  To page go to [email or web address]   [facility] -  Office  [phone]      See all  Orders from today for further details    Objective:   Vitals:   09/11/20 0000 09/11/20 0407 09/11/20 0742 09/11/20 1135  BP: (!) 188/78 (!) 192/65 (!) 165/77 139/64  Pulse: (!) 58 (!) 55 63 (!) 59  Resp: 14 15 20  18  Temp: 98 F (36.7 C) 97.8 F (36.6 C) 98.1 F (36.7 C) 98.1 F (36.7 C)  TempSrc: Oral Oral Oral Oral  SpO2: 99% 95% 91% 90%  Weight:      Height:        Wt Readings from Last 3 Encounters:  09/09/20 81.8 kg  06/14/20 81.8 kg  06/06/20 89.5 kg     Intake/Output Summary (Last 24 hours) at 09/11/2020 1531 Last data filed at 09/11/2020 1223 Gross per 24 hour  Intake 1454.9 ml  Output 1000 ml  Net 454.9 ml     Physical Exam  Awake Alert, in no apparent distress Symmetrical Chest wall movement, Good air movement bilaterally, CTAB RRR,No Gallops,Rubs or new Murmurs, No Parasternal Heave +ve B.Sounds, Abd Soft, No tenderness, No rebound - guarding or rigidity. Bilateral BKA, left shoulder mildly tender      Data Review:    CBC Recent Labs  Lab 09/09/20 1208 09/10/20 0301 09/11/20 0613  WBC 18.6* 13.2* 10.6*  HGB 12.6* 11.6* 13.8  HCT 41.0 37.6* 44.6  PLT 346 317 391  MCV 97.4 95.7 95.1  MCH 29.9 29.5 29.4  MCHC 30.7 30.9 30.9  RDW 15.0 14.7 14.7  LYMPHSABS 0.5*  --  0.5*  MONOABS 1.6*  --  0.4  EOSABS 0.0  --  0.0  BASOSABS 0.1  --  0.0    Recent Labs  Lab 09/09/20 1208 09/09/20 1757 09/10/20 0301 09/10/20 1252 09/11/20 0613  NA 138  --  140  --  138  K 3.1*  --  2.6* 4.0 4.4  CL 97*  --  100  --  100  CO2 30  --  31  --  28  GLUCOSE 139*  --  62*  --  158*  BUN 7*  --  10  --  12  CREATININE 0.77  --  0.58*  --  0.59*  CALCIUM 8.6*  --  8.4*  --  9.2  AST 14*  --   --   --  13*  ALT 11  --   --   --  13  ALKPHOS 48  --   --   --  56  BILITOT 1.2  --   --   --  0.9  ALBUMIN 2.8*  --   --   --  2.8*  MG  --  2.0 2.1  --  2.1  CRP 26.0*  --  29.4*  --  28.5*  DDIMER 1.87*  --   --   --   --   PROCALCITON  --   --  0.66  --   0.38  LATICACIDVEN 1.5  --   --   --   --   INR 1.3*  --  1.3*  --  1.5*  TSH 0.169*  --   --   --  0.515  BNP 224.5*  --  180.1*  --  372.5*    ------------------------------------------------------------------------------------------------------------------ No results for input(s): CHOL, HDL, LDLCALC, TRIG, CHOLHDL, LDLDIRECT in the last 72 hours.  Lab Results  Component Value Date   HGBA1C 6.5 (H) 06/15/2020   ------------------------------------------------------------------------------------------------------------------ Recent Labs    09/11/20 0613  TSH 0.515    Cardiac Enzymes No results for input(s): CKMB, TROPONINI, MYOGLOBIN in the last 168 hours.  Invalid input(s): CK ------------------------------------------------------------------------------------------------------------------    Component Value Date/Time   BNP 372.5 (H) 09/11/2020 0613     Radiology Reports CT Head Wo Contrast  Result Date: 09/09/2020 CLINICAL DATA:  Mental status changes. Unknown cause. Patient found in to the left. EXAM: CT HEAD WITHOUT CONTRAST TECHNIQUE: Contiguous axial images were obtained from the base of the skull through the vertex without intravenous contrast. COMPARISON:  CT head without contrast 06/14/2020 and 06/06/2020 FINDINGS: Brain: No acute infarct, hemorrhage, or mass lesion is present. Moderate atrophy and white matter changes are stable. The ventricles are proportionate to the degree of atrophy. No significant extraaxial fluid collection is present. The brainstem and cerebellum are within normal limits. Calcified lesion along a left frontal convexity on image 47 of series 3 is stable, consistent with a small meningioma. Vascular: Cirrhotic changes within the cavernous internal carotid arteries are stable. No hyperdense vessel is present. Skull: Calvarium is intact. No focal lytic or blastic lesions are present. No significant extracranial soft tissue lesion is present.  Sinuses/Orbits: The paranasal sinuses and mastoid air cells are clear. Bilateral lens replacements are noted. Globes and orbits are otherwise unremarkable. IMPRESSION: 1. No acute intracranial abnormality or significant interval change. 2. Stable atrophy and white matter disease. This likely reflects the sequela of chronic microvascular ischemia. 3. Stable small left frontal convexity meningioma. Smaller areas of calcification likely represent dural ossification. Small meningiomas not excluded. Electronically Signed   By: Marin Roberts M.D.   On: 09/09/2020 13:30   DG CHEST PORT 1 VIEW  Result Date: 09/10/2020 CLINICAL DATA:  Cough and shortness of breath. EXAM: PORTABLE CHEST 1 VIEW COMPARISON:  Chest x-ray from yesterday. FINDINGS: Stable cardiomediastinal silhouette. Unchanged retrocardiac left lower lobe consolidation. Improved aeration at the right lung base. No pleural effusion or pneumothorax. No acute osseous abnormality. IMPRESSION: 1. Unchanged left lower lobe pneumonia. Electronically Signed   By: Obie Dredge M.D.   On: 09/10/2020 08:09   DG Chest Port 1 View  Result Date: 09/09/2020 CLINICAL DATA:  Mental status changes and hypoxia. EXAM: PORTABLE CHEST 1 VIEW COMPARISON:  06/14/2020 FINDINGS: Stable mild cardiac enlargement, aortic atherosclerosis and tortuosity of the thoracic aorta. Since the prior study, development asymmetric pulmonary airspace opacity in the left lower lung zone suspicious for pneumonia. There also is subtle increased opacity at the right lung base consistent with atelectasis or infiltrate. No overt edema or significant pleural effusions. No pneumothorax. IMPRESSION: Development of asymmetric opacity in the left lower lung zone suspicious for acute pneumonia. Mild right basilar atelectasis versus infiltrate. Electronically Signed   By: Irish Lack M.D.   On: 09/09/2020 12:09   DG Shoulder Left  Result Date: 09/09/2020 CLINICAL DATA:  Left shoulder pain.  No  known trauma. EXAM: LEFT SHOULDER - 2+ VIEW COMPARISON:  Left shoulder radiographs 07/09/2014 FINDINGS: Progressive advanced degenerative changes are noted in the left shoulder. No acute or healing fractures are present. Heart is enlarged. Atherosclerotic changes are noted at the arch. IMPRESSION: 1. Progressive advanced degenerative changes of the left shoulder. 2. No acute abnormality. Electronically Signed   By: Marin Roberts M.D.   On: 09/09/2020 12:12

## 2020-09-11 NOTE — Progress Notes (Signed)
Physical Therapy Treatment Patient Details Name: Mark Russell MRN: [id number] DOB: [birth year] Today's Date: 09/11/2020    History of Present Illness 85 y.o. male presented to ED 09/09/20 for AMS and concerns for stroke. +pneumonia, sepsis, Lt shoulder pain (?septic arthritis with MRI pending); ortho consulted with left shoulder aspirated 09/09/20  PMH significant of  PVD, DVT on anticoagulation, a fib, HTN, DM2, COPD, OSA, HLD, bil AKA amputations    PT Comments    Pt mobilizing well, seems to be at baseline level with transfers. Was able to scoot self into drop arm recliner with close supervision but no physical assist. L shoulder pain has decreased significantly. PT will continue to follow.    Follow Up Recommendations  Home health PT;Supervision - Intermittent     Equipment Recommendations  None recommended by PT    Recommendations for Other Services       Precautions / Restrictions Precautions Precautions: Fall Precaution Comments: L shoulder pain Restrictions Weight Bearing Restrictions: No    Mobility  Bed Mobility Overal bed mobility: Needs Assistance Bed Mobility: Supine to Sit     Supine to sit: HOB elevated;Supervision     General bed mobility comments: pt came to long sitting with supervision and use of rail, simulating what he does at home    Transfers Overall transfer level: Needs assistance   Transfers: Lateral/Scoot Transfers          Lateral/Scoot Transfers: Supervision General transfer comment: pt able to scoot self into chair with supervision and stabilizing chair but no physical assist for scooting. Pt rocks up onto residual limbs and uses them to "walk" during transfer  Ambulation/Gait             General Gait Details: pt uses Mark Russell (Stroke Patients Only)       Balance Overall balance assessment: Needs assistance Sitting-balance support: No upper extremity  supported Sitting balance-Leahy Scale: Good Sitting balance - Comments: can weight shift without use of UEs                                    Cognition Arousal/Alertness: Awake/alert Behavior During Therapy: WFL for tasks assessed/performed Overall Cognitive Status: No family/caregiver present to determine baseline cognitive functioning Area of Impairment: Memory                               General Comments: pt appropriate during session but when giving history of family STM deficits apparent. Expect this is baseline      Exercises      General Comments General comments (skin integrity, edema, etc.): SpO2 on 90's on RA      Pertinent Vitals/Pain Pain Assessment: Faces Faces Pain Scale: Hurts a little bit Pain Location: left shoulder Pain Descriptors / Indicators: Aching Pain Intervention(s): Limited activity within patient's tolerance;Monitored during session    Home Living                      Prior Function            PT Goals (current goals can now be found in the care plan section) Acute Rehab PT Goals Patient Stated Goal: get shoulder better and go home PT Goal Formulation: With patient Time For Goal  Achievement: 09/24/20 Potential to Achieve Goals: Good Progress towards PT goals: Progressing toward goals    Frequency    Min 3X/week      PT Plan Current plan remains appropriate    Co-evaluation              AM-PAC PT "6 Clicks" Mobility   Outcome Measure  Help needed turning from your back to your side while in a flat bed without using bedrails?: A Little Help needed moving from lying on your back to sitting on the side of a flat bed without using bedrails?: A Little Help needed moving to and from a bed to a chair (including a wheelchair)?: A Little Help needed standing up from a chair using your arms (e.g., wheelchair or bedside chair)?: Total Help needed to walk in hospital room?: Total Help needed  climbing 3-5 steps with a railing? : Total 6 Click Score: 12    End of Session   Activity Tolerance: Patient tolerated treatment well Patient left: with call bell/phone within reach;in chair;with chair alarm set Nurse Communication: Mobility status PT Visit Diagnosis: Pain Pain - Right/Left: Left Pain - part of body: Shoulder     Time: 1033-1050 PT Time Calculation (min) (ACUTE ONLY): 17 min  Charges:  $Therapeutic Activity: 8-22 mins                     Lyanne Co, PT  Acute Rehab Services  Pager [phone] Office [phone]    [name] 09/11/2020, 2:16 PM

## 2020-09-11 NOTE — Progress Notes (Signed)
Managed to administer 3 IV antibiotics. Awaiting MRI call. We have lost another IV. IV team has been called

## 2020-09-11 NOTE — Progress Notes (Signed)
ANTICOAGULATION CONSULT NOTE - Pharmacy Consult for Warfarin / Lovenox Indication: atrial fibrillation; Hx of DVT  Allergies  Allergen Reactions   Ace Inhibitors     hypotension   Dilaudid [Hydromorphone Hcl]     "Mean"   Metformin And Related     GI upset   Morphine And Related Other (See Comments)    "mean"   Pletal [Cilostazol]     SOB    Patient Measurements:  Vital Signs: Temp: 98.1 F (36.7 C) (09/07 0742) Temp Source: Oral (09/07 0742) BP: 165/77 (09/07 0742) Pulse Rate: 63 (09/07 0742)  Labs: Recent Labs    09/09/20 1208 09/09/20 1350 09/10/20 0301 09/11/20 0613  HGB 12.6*  --  11.6* 13.8  HCT 41.0  --  37.6* 44.6  PLT 346  --  317 391  LABPROT 16.2*  --  16.6* 17.8*  INR 1.3*  --  1.3* 1.5*  CREATININE 0.77  --  0.58* 0.59*  TROPONINIHS 41* 44*  --   --      Estimated Creatinine Clearance: 45.7 mL/min (A) (by C-G formula based on SCr of 0.59 mg/dL (L)).   Assessment: Patient with a history of PVD s/p B/L AKA (2007), DVT, a fib (on coumadin), HTN, DM2, COVID PNA (06/01/20) with hypoxia. Patient presenting with AMS.  Patient is on warfarin prior to arrival. Patient taking 2mg  daily - last taken 9/4.  INR subtherapeutic - > 1.5  Goal of Therapy:  INR 2-3 Monitor platelets by anticoagulation protocol: Yes Appropriate Lovenox dosing   Plan:  Enoxaparin 1 mg/kg subQ q12h while INR <2 Repeat Warfarin 6 mg tonight Follow up AM INR  Thank you 11/4, PharmD  09/11/2020 8:31 AM

## 2020-09-11 NOTE — Progress Notes (Addendum)
Subjective:  Patient is alert, sitting up in bed. States pain at left shoulder is better today than yesterday, feel like he can move it better and less pain with active ROM. No other complaints.  Objective:  PE: VITALS:   Vitals:   09/10/20 2104 09/11/20 0000 09/11/20 0407 09/11/20 0742  BP: (!) 145/81 (!) 188/78 (!) 192/65 (!) 165/77  Pulse: 64 (!) 58 (!) 55 63  Resp: 20 14 15 20   Temp: 98.2 F (36.8 C) 98 F (36.7 C) 97.8 F (36.6 C) 98.1 F (36.7 C)  TempSrc: Oral Oral Oral Oral  SpO2: 98% 99% 95% 91%  Weight:      Height:       0-40 degrees AROM L shoulder. Large effusion.  Less pain with movement today. Full ROM left wrist, hand, and fingers. 2+ radial pulse. Moderate warmth, no erythema over left shoulder. Mild drainage seen on bulky dressing today, changed to mepilex.  LABS  Results for orders placed or performed during the hospital encounter of 09/09/20 (from the past 24 hour(s))  Glucose, capillary     Status: Abnormal   Collection Time: 09/10/20 11:14 AM  Result Value Ref Range   Glucose-Capillary 111 (H) 70 - 99 mg/dL  Potassium     Status: None   Collection Time: 09/10/20 12:52 PM  Result Value Ref Range   Potassium 4.0 3.5 - 5.1 mmol/L  Uric acid     Status: Abnormal   Collection Time: 09/10/20 12:52 PM  Result Value Ref Range   Uric Acid, Serum 3.6 (L) 3.7 - 8.6 mg/dL  Glucose, capillary     Status: None   Collection Time: 09/10/20  3:28 PM  Result Value Ref Range   Glucose-Capillary 95 70 - 99 mg/dL  Glucose, capillary     Status: Abnormal   Collection Time: 09/10/20  8:40 PM  Result Value Ref Range   Glucose-Capillary 176 (H) 70 - 99 mg/dL  Protime-INR     Status: Abnormal   Collection Time: 09/11/20  6:13 AM  Result Value Ref Range   Prothrombin Time 17.8 (H) 11.4 - 15.2 seconds   INR 1.5 (H) 0.8 - 1.2  Magnesium     Status: None   Collection Time: 09/11/20  6:13 AM  Result Value Ref Range   Magnesium 2.1 1.7 - 2.4 mg/dL  C-reactive  protein     Status: Abnormal   Collection Time: 09/11/20  6:13 AM  Result Value Ref Range   CRP 28.5 (H) <1.0 mg/dL  Comprehensive metabolic panel     Status: Abnormal   Collection Time: 09/11/20  6:13 AM  Result Value Ref Range   Sodium 138 135 - 145 mmol/L   Potassium 4.4 3.5 - 5.1 mmol/L   Chloride 100 98 - 111 mmol/L   CO2 28 22 - 32 mmol/L   Glucose, Bld 158 (H) 70 - 99 mg/dL   BUN 12 8 - 23 mg/dL   Creatinine, Ser 11/11/20 (L) 0.61 - 1.24 mg/dL   Calcium 9.2 8.9 - 2.83 mg/dL   Total Protein 7.5 6.5 - 8.1 g/dL   Albumin 2.8 (L) 3.5 - 5.0 g/dL   AST 13 (L) 15 - 41 U/L   ALT 13 0 - 44 U/L   Alkaline Phosphatase 56 38 - 126 U/L   Total Bilirubin 0.9 0.3 - 1.2 mg/dL   GFR, Estimated 66.2 >94 mL/min   Anion gap 10 5 - 15  CBC with Differential/Platelet  Status: Abnormal   Collection Time: 09/11/20  6:13 AM  Result Value Ref Range   WBC 10.6 (H) 4.0 - 10.5 K/uL   RBC 4.69 4.22 - 5.81 MIL/uL   Hemoglobin 13.8 13.0 - 17.0 g/dL   HCT 38.1 82.9 - 93.7 %   MCV 95.1 80.0 - 100.0 fL   MCH 29.4 26.0 - 34.0 pg   MCHC 30.9 30.0 - 36.0 g/dL   RDW 16.9 67.8 - 93.8 %   Platelets 391 150 - 400 K/uL   nRBC 0.2 0.0 - 0.2 %   Neutrophils Relative % 90 %   Neutro Abs 9.6 (H) 1.7 - 7.7 K/uL   Lymphocytes Relative 5 %   Lymphs Abs 0.5 (L) 0.7 - 4.0 K/uL   Monocytes Relative 4 %   Monocytes Absolute 0.4 0.1 - 1.0 K/uL   Eosinophils Relative 0 %   Eosinophils Absolute 0.0 0.0 - 0.5 K/uL   Basophils Relative 0 %   Basophils Absolute 0.0 0.0 - 0.1 K/uL   Immature Granulocytes 1 %   Abs Immature Granulocytes 0.06 0.00 - 0.07 K/uL  Brain natriuretic peptide     Status: Abnormal   Collection Time: 09/11/20  6:13 AM  Result Value Ref Range   B Natriuretic Peptide 372.5 (H) 0.0 - 100.0 pg/mL  Procalcitonin     Status: None   Collection Time: 09/11/20  6:13 AM  Result Value Ref Range   Procalcitonin 0.38 ng/mL  TSH     Status: None   Collection Time: 09/11/20  6:13 AM  Result Value Ref Range    TSH 0.515 0.350 - 4.500 uIU/mL  T4, free     Status: None   Collection Time: 09/11/20  6:13 AM  Result Value Ref Range   Free T4 0.95 0.61 - 1.12 ng/dL  Glucose, capillary     Status: Abnormal   Collection Time: 09/11/20  7:47 AM  Result Value Ref Range   Glucose-Capillary 147 (H) 70 - 99 mg/dL    CT Head Wo Contrast  Result Date: 09/09/2020 CLINICAL DATA:  Mental status changes. Unknown cause. Patient found in to the left. EXAM: CT HEAD WITHOUT CONTRAST TECHNIQUE: Contiguous axial images were obtained from the base of the skull through the vertex without intravenous contrast. COMPARISON:  CT head without contrast 06/14/2020 and 06/06/2020 FINDINGS: Brain: No acute infarct, hemorrhage, or mass lesion is present. Moderate atrophy and white matter changes are stable. The ventricles are proportionate to the degree of atrophy. No significant extraaxial fluid collection is present. The brainstem and cerebellum are within normal limits. Calcified lesion along a left frontal convexity on image 47 of series 3 is stable, consistent with a small meningioma. Vascular: Cirrhotic changes within the cavernous internal carotid arteries are stable. No hyperdense vessel is present. Skull: Calvarium is intact. No focal lytic or blastic lesions are present. No significant extracranial soft tissue lesion is present. Sinuses/Orbits: The paranasal sinuses and mastoid air cells are clear. Bilateral lens replacements are noted. Globes and orbits are otherwise unremarkable. IMPRESSION: 1. No acute intracranial abnormality or significant interval change. 2. Stable atrophy and white matter disease. This likely reflects the sequela of chronic microvascular ischemia. 3. Stable small left frontal convexity meningioma. Smaller areas of calcification likely represent dural ossification. Small meningiomas not excluded. Electronically Signed   By: Marin Roberts M.D.   On: 09/09/2020 13:30   DG CHEST PORT 1 VIEW  Result  Date: 09/10/2020 CLINICAL DATA:  Cough and shortness of breath. EXAM: PORTABLE  CHEST 1 VIEW COMPARISON:  Chest x-ray from yesterday. FINDINGS: Stable cardiomediastinal silhouette. Unchanged retrocardiac left lower lobe consolidation. Improved aeration at the right lung base. No pleural effusion or pneumothorax. No acute osseous abnormality. IMPRESSION: 1. Unchanged left lower lobe pneumonia. Electronically Signed   By: Obie Dredge M.D.   On: 09/10/2020 08:09   DG Chest Port 1 View  Result Date: 09/09/2020 CLINICAL DATA:  Mental status changes and hypoxia. EXAM: PORTABLE CHEST 1 VIEW COMPARISON:  06/14/2020 FINDINGS: Stable mild cardiac enlargement, aortic atherosclerosis and tortuosity of the thoracic aorta. Since the prior study, development asymmetric pulmonary airspace opacity in the left lower lung zone suspicious for pneumonia. There also is subtle increased opacity at the right lung base consistent with atelectasis or infiltrate. No overt edema or significant pleural effusions. No pneumothorax. IMPRESSION: Development of asymmetric opacity in the left lower lung zone suspicious for acute pneumonia. Mild right basilar atelectasis versus infiltrate. Electronically Signed   By: Irish Lack M.D.   On: 09/09/2020 12:09   DG Shoulder Left  Result Date: 09/09/2020 CLINICAL DATA:  Left shoulder pain.  No known trauma. EXAM: LEFT SHOULDER - 2+ VIEW COMPARISON:  Left shoulder radiographs 07/09/2014 FINDINGS: Progressive advanced degenerative changes are noted in the left shoulder. No acute or healing fractures are present. Heart is enlarged. Atherosclerotic changes are noted at the arch. IMPRESSION: 1. Progressive advanced degenerative changes of the left shoulder. 2. No acute abnormality. Electronically Signed   By: Marin Roberts M.D.   On: 09/09/2020 12:12    Assessment/Plan:  L shoulder psuedogout with coexisting rotator cuff arthropathy: - aspirate shows intracellular calcium pyrophosphatate  crystals, cultures so far negative but will continue to watch - Aspiration and injection performed yesterday which seems to have improved pain, was able to aspirate 60 cc's off of his shoulder - colchicine was added, will defer to primary regarding systemic steroids, currently being held due to diabetes - still awaiting MRI, at this point dont think that MRI is needed since we have a known cause of his effusion and we have not been able to complete the MRI due to patient pulling out his IV multiple times yesterday. Will D/c MRI order   Contact information:   Weekdays 8-5 Janine Ores, PA-C [phone] A fter hours and holidays please check [email or web address] for group call information for [facility]  Armida Sans 09/11/2020, 8:38 AM

## 2020-09-12 LAB — COMPREHENSIVE METABOLIC PANEL
ALT: 13 U/L (ref 0–44)
AST: 17 U/L (ref 15–41)
Albumin: 2.6 g/dL — ABNORMAL LOW (ref 3.5–5.0)
Alkaline Phosphatase: 54 U/L (ref 38–126)
Anion gap: 10 (ref 5–15)
BUN: 14 mg/dL (ref 8–23)
CO2: 30 mmol/L (ref 22–32)
Calcium: 9 mg/dL (ref 8.9–10.3)
Chloride: 98 mmol/L (ref 98–111)
Creatinine, Ser: 0.62 mg/dL (ref 0.61–1.24)
GFR, Estimated: >60 mL/min (ref >60)
Glucose, Bld: 113 mg/dL — ABNORMAL HIGH (ref 70–99)
Potassium: 4.1 mmol/L (ref 3.5–5.1)
Sodium: 138 mmol/L (ref 135–145)
Total Bilirubin: 0.5 mg/dL (ref 0.3–1.2)
Total Protein: 6.7 g/dL (ref 6.5–8.1)

## 2020-09-12 LAB — CBC WITH DIFFERENTIAL/PLATELET
Abs Immature Granulocytes: 0.14 10*3/uL — ABNORMAL HIGH (ref 0.00–0.07)
Basophils Absolute: 0.1 10*3/uL (ref 0.0–0.1)
Basophils Relative: 1 %
Eosinophils Absolute: 0.1 10*3/uL (ref 0.0–0.5)
Eosinophils Relative: 1 %
HCT: 40.1 % (ref 39.0–52.0)
Hemoglobin: 12.6 g/dL — ABNORMAL LOW (ref 13.0–17.0)
Immature Granulocytes: 1 %
Lymphocytes Relative: 7 %
Lymphs Abs: 1 10*3/uL (ref 0.7–4.0)
MCH: 29.8 pg (ref 26.0–34.0)
MCHC: 31.4 g/dL (ref 30.0–36.0)
MCV: 94.8 fL (ref 80.0–100.0)
Monocytes Absolute: 1.2 10*3/uL — ABNORMAL HIGH (ref 0.1–1.0)
Monocytes Relative: 9 %
Neutro Abs: 10.8 10*3/uL — ABNORMAL HIGH (ref 1.7–7.7)
Neutrophils Relative %: 81 %
Platelets: 440 10*3/uL — ABNORMAL HIGH (ref 150–400)
RBC: 4.23 MIL/uL (ref 4.22–5.81)
RDW: 14.6 % (ref 11.5–15.5)
WBC: 13.3 10*3/uL — ABNORMAL HIGH (ref 4.0–10.5)
nRBC: 0 % (ref 0.0–0.2)

## 2020-09-12 LAB — MAGNESIUM: Magnesium: 2.1 mg/dL (ref 1.7–2.4)

## 2020-09-12 LAB — GLUCOSE, CAPILLARY
Glucose-Capillary: 170 mg/dL — ABNORMAL HIGH (ref 70–99)
Glucose-Capillary: 90 mg/dL (ref 70–99)
Glucose-Capillary: 135 mg/dL — ABNORMAL HIGH (ref 70–99)
Glucose-Capillary: 109 mg/dL — ABNORMAL HIGH (ref 70–99)

## 2020-09-12 LAB — T3: T3, Total: 70 ng/dL — ABNORMAL LOW (ref 71–180)

## 2020-09-12 LAB — C-REACTIVE PROTEIN: CRP: 13 mg/dL — ABNORMAL HIGH (ref <1.0)

## 2020-09-12 LAB — BRAIN NATRIURETIC PEPTIDE: B Natriuretic Peptide: 323.8 pg/mL — ABNORMAL HIGH (ref 0.0–100.0)

## 2020-09-12 LAB — PROTIME-INR
INR: 2.1 — ABNORMAL HIGH (ref 0.8–1.2)
Prothrombin Time: 23.2 seconds — ABNORMAL HIGH (ref 11.4–15.2)

## 2020-09-12 LAB — PROCALCITONIN: Procalcitonin: 0.34 ng/mL

## 2020-09-12 MED ORDER — HYDRALAZINE HCL 25 MG PO TABS
25.0000 mg | ORAL_TABLET | Freq: Four times a day (QID) | ORAL | Status: DC
  Administered 2020-09-12 – 2020-09-13 (×5): 25 mg via ORAL
  Filled 2020-09-12 (×5): qty 1

## 2020-09-12 MED ORDER — WARFARIN SODIUM 2 MG PO TABS
2.0000 mg | ORAL_TABLET | Freq: Once | ORAL | Status: AC
  Administered 2020-09-12: 2 mg via ORAL
  Filled 2020-09-12: qty 1

## 2020-09-12 NOTE — Discharge Instructions (Signed)
Information on my medicine - Coumadin   (Warfarin)  This medication education was reviewed with me or my healthcare representative as part of my discharge preparation.    Why was Coumadin prescribed for you? Coumadin was prescribed for you because you have a blood clot or a medical condition that can cause an increased risk of forming blood clots. Blood clots can cause serious health problems by blocking the flow of blood to the heart, lung, or brain. Coumadin can prevent harmful blood clots from forming. As a reminder your indication for Coumadin is:  Stroke Prevention because of Atrial Fibrillation and history of DVT.  What test will check on my response to Coumadin? While on Coumadin (warfarin) you will need to have an INR test regularly to ensure that your dose is keeping you in the desired range. The INR (international normalized ratio) number is calculated from the result of the laboratory test called prothrombin time (PT).  If an INR APPOINTMENT HAS NOT ALREADY BEEN MADE FOR YOU please schedule an appointment to have this lab work done by your health care provider within 7 days. Your INR goal is usually a number between:  2 to 3 or your provider may give you a more narrow range like 2-2.5.  Ask your health care provider during an office visit what your goal INR is.  What  do you need to  know  About  COUMADIN? Take Coumadin (warfarin) exactly as prescribed by your healthcare provider about the same time each day.  DO NOT stop taking without talking to the doctor who prescribed the medication.  Stopping without other blood clot prevention medication to take the place of Coumadin may increase your risk of developing a new clot or stroke.  Get refills before you run out.  What do you do if you miss a dose? If you miss a dose, take it as soon as you remember on the same day then continue your regularly scheduled regimen the next day.  Do not take two doses of Coumadin at the same  time.  Important Safety Information A possible side effect of Coumadin (Warfarin) is an increased risk of bleeding. You should call your healthcare provider right away if you experience any of the following: Bleeding from an injury or your nose that does not stop. Unusual colored urine (red or dark brown) or unusual colored stools (red or black). Unusual bruising for unknown reasons. A serious fall or if you hit your head (even if there is no bleeding).  Some foods or medicines interact with Coumadin (warfarin) and might alter your response to warfarin. To help avoid this: Eat a balanced diet, maintaining a consistent amount of Vitamin K. Notify your provider about major diet changes you plan to make. Avoid alcohol or limit your intake to 1 drink for women and 2 drinks for men per day. (1 drink is 5 oz. wine, 12 oz. beer, or 1.5 oz. liquor.)  Make sure that ANY health care provider who prescribes medication for you knows that you are taking Coumadin (warfarin).  Also make sure the healthcare provider who is monitoring your Coumadin knows when you have started a new medication including herbals and non-prescription products.  Coumadin (Warfarin)  Major Drug Interactions  Increased Warfarin Effect Decreased Warfarin Effect  Alcohol (large quantities) Antibiotics (esp. Septra/Bactrim, Flagyl, Cipro) Amiodarone (Cordarone) Aspirin (ASA) Cimetidine (Tagamet) Megestrol (Megace) NSAIDs (ibuprofen, naproxen, etc.) Piroxicam (Feldene) Propafenone (Rythmol SR) Propranolol (Inderal) Isoniazid (INH) Posaconazole (Noxafil) Barbiturates (Phenobarbital) Carbamazepine (Tegretol) Chlordiazepoxide (Librium)  Cholestyramine (Questran) Griseofulvin Oral Contraceptives Rifampin Sucralfate (Carafate) Vitamin K   Coumadin (Warfarin) Major Herbal Interactions  Increased Warfarin Effect Decreased Warfarin Effect  Garlic Ginseng Ginkgo biloba Coenzyme Q10 Green tea St. John's wort     Coumadin (Warfarin) FOOD Interactions  Eat a consistent number of servings per week of foods HIGH in Vitamin K (1 serving =  cup)  Collards (cooked, or boiled & drained) Kale (cooked, or boiled & drained) Mustard greens (cooked, or boiled & drained) Parsley *serving size only =  cup Spinach (cooked, or boiled & drained) Swiss chard (cooked, or boiled & drained) Turnip greens (cooked, or boiled & drained)  Eat a consistent number of servings per week of foods MEDIUM-HIGH in Vitamin K (1 serving = 1 cup)  Asparagus (cooked, or boiled & drained) Broccoli (cooked, boiled & drained, or raw & chopped) Brussel sprouts (cooked, or boiled & drained) *serving size only =  cup Lettuce, raw (green leaf, endive, romaine) Spinach, raw Turnip greens, raw & chopped   These websites have more information on Coumadin (warfarin):  http://www.king-russell.com/; https://www.hines.net/;

## 2020-09-12 NOTE — Progress Notes (Signed)
ANTICOAGULATION CONSULT NOTE - Pharmacy Consult for Warfarin / Lovenox Indication: atrial fibrillation; Hx of DVT  Allergies  Allergen Reactions   Ace Inhibitors     hypotension   Dilaudid [Hydromorphone Hcl]     "Mean"   Metformin And Related     GI upset   Morphine And Related Other (See Comments)    "mean"   Pletal [Cilostazol]     SOB    Patient Measurements:  Vital Signs: Temp: 98.5 F (36.9 C) (09/08 0802) Temp Source: Oral (09/08 0802) BP: 167/82 (09/08 0802) Pulse Rate: 56 (09/08 0802)  Labs: Recent Labs    09/09/20 1208 09/09/20 1350 09/10/20 0301 09/11/20 0613 09/12/20 0237  HGB 12.6*  --  11.6* 13.8 12.6*  HCT 41.0  --  37.6* 44.6 40.1  PLT 346  --  317 391 440*  LABPROT 16.2*  --  16.6* 17.8* 23.2*  INR 1.3*  --  1.3* 1.5* 2.1*  CREATININE 0.77  --  0.58* 0.59* 0.62  TROPONINIHS 41* 44*  --   --   --     Estimated Creatinine Clearance: 45.7 mL/min (by C-G formula based on SCr of 0.62 mg/dL).   Assessment: Patient with a history of PVD s/p B/L AKA (2007), history of DVT, a fib (on coumadin), HTN, DM2, COVID PNA (06/01/20) with hypoxia. Patient presenting with AMS.  Patient is on warfarin prior to arrival. Patient taking 2mg  daily - last taken 9/4. INR on admission low at 1.3.   INR therapeutic today at 2.1 (large jump from yesterday at 1.5 after 2 days of high dose 6 mg; also noted drug interaction with doxycycline planned through 9/10 - moderate risk of increased bleeding due to reduce prothrombin activity). Po intake reported 75-100%. Note patient has pseudogout of L-shoulder s/p aspiration 9/7.   Goal of Therapy:  INR 2-3 Monitor platelets by anticoagulation protocol: Yes Appropriate Lovenox dosing   Plan:  Reduce Warfarin to 2mg  po x1 tonight.  *Expect INR to rise further after 2 days of higher dose given with drug interaction noted with Doxycycline. May require further reduction.  Discontinue Lovenox therapy as INR >2.  Follow up AM  INR If patient to discharge home - would recommend Warfarin 2mg  po daily but strongly recommend INR tomorrow (Friday) in clinic/home testing to see extent of rise after last 2 days of increased warfarin and drug interaction with Doxycycline.   Thank you , PharmD, BCPS, BCCCP Clinical Pharmacist, [phone] Please refer to [facility] for [facility] numbers 09/12/2020 8:24 AM

## 2020-09-12 NOTE — Progress Notes (Addendum)
PROGRESS NOTE                                                                                                                                                                                                             Patient Demographics:    Mark Russell, is a 85 y.o. male, DOB - Nov 03, 1935, ZOX:[id number]  Outpatient Primary MD for the patient is Maurice Small, MD    LOS - 3  Admit date - 09/09/2020    Chief Complaint  Patient presents with   Altered Mental Status       Brief Narrative (HPI from H&P)   - Mark Russell is a 85 y.o. male with medical history significant of  PVD, DVT on anticoagulation, a fib, HTN, DM2, COPD, OSA, HLD who presented to ED for AMS and concerns for stroke by family.  His ER work-up was suggestive of community-acquired pneumonia along with left shoulder inflammation and he was admitted to the hospital.  Head CT was unremarkable he had no focal deficits.   Subjective:    Mark Russell still reports some congestion, but overall he is feeling better .    Assessment  & Plan :   CAP causing Sepsis and toxic encephalopathy and precipitation of pseudogout  - agree with empiric IV antibiotics which should be continued, MRSA nasal PCR is positive. -Treated with broad-spectrum antibiotic, vancomycin and cefepime, vancomycin given MRSA PCR screen positive, vancomycin Neuroth doxycycline today given he continues to improve.  -I have encouraged him to use incentive erectly and flutter valve.  Left shoulder pain./Pseudogout - seen by orthopedics, underwent arthrocentesis on 09/09/2020 fluid so far suggestive of pseudogout related inflammation, not consistent with infectious arthritis.  Have added colchicine to reduce inflammation, he will benefit from intra-articular steroid shot, hesitant to give systemic steroids due to his diabetes mellitus. -Patient reports shoulder pain significantly  improved.  Toxic encephalopathy.  Resolved.  Head CT negative no focal deficits caused by sepsis and inflammation from pseudogout in the left shoulder joint.  Hypokalemia.  Replaced.  B12 deficiency -Started on IM supplements, transition to p.o. on discharge   Bilateral BKA.  Homebound lives on a wheelchair with close supervision from family, family would prefer taking him back home if possible.  Acute hypoxic respiratory failure.  Due to pneumonia resolved.  Morbid obesity with OSA.  BMI of  40.  Not on CPAP at home, with family, follow with PCP for weight loss.  Dyslipidemia.  On statin.  History of COPD.  Supportive care no acute issues.  PAD.  On treatment within an statin for secondary prevention.  Paroxysmal atrial fibrillation.  [place] vas 2 score of greater than 3.  On beta-blocker and Coumadin combination.  Pharmacy monitoring INR.  Lovenox till INR is therapeutic.  HTN - On Norvasc + B Blocker.  Blood pressure remains significantly uncontrolled, will start on low-dose hydralazine.   Low B12 - SQ replacement x 7 days then PO.  Chronic Pain - Home Rx.  BPH - Flomax  Mild cognitive decline.  On Aricept, at risk for delirium.  Monitor.    DM 2 - ISS  Lab Results  Component Value Date   HGBA1C 6.5 (H) 06/15/2020    CBG (last 3)  Recent Labs    09/11/20 2242 09/12/20 0812 09/12/20 1204  GLUCAP 149* 170* 90         Condition - Guarded  Family Communication  : None at bedside  Code Status :  Full  Consults  :  Ortho  PUD Prophylaxis :   Procedures  :     L.  Shoulder arthrocentesis consistent with pseudogout.      Disposition Plan  :    Status is: Inpatient  Remains inpatient appropriate because:IV treatments appropriate due to intensity of illness or inability to take PO  Dispo: The patient is from: Home              Anticipated d/c is to: Home              Patient currently is not medically stable to d/c.   Difficult to place patient  No   DVT Prophylaxis  :     warfarin (COUMADIN) tablet 2 mg      Lab Results  Component Value Date   PLT 440 (H) 09/12/2020    Diet :  Diet Order             Diet heart healthy/carb modified Room service appropriate? No; Fluid consistency: Thin  Diet effective now                    Inpatient Medications  Scheduled Meds:  amLODipine  10 mg Oral Daily   bupivacaine  10 mL Infiltration Once   Chlorhexidine Gluconate Cloth  6 each Topical Q0600   cholecalciferol  2,000 Units Oral Daily   colchicine  0.3 mg Oral Daily   cyanocobalamin  1,000 mcg Subcutaneous Daily   donepezil  10 mg Oral QHS   doxycycline  100 mg Oral Q12H   DULoxetine  30 mg Oral Daily   fentaNYL  1 patch Transdermal Q72H   FLUoxetine  40 mg Oral Daily   gabapentin  300 mg Oral TID   insulin aspart  0-9 Units Subcutaneous TID WC   methylPREDNISolone acetate  40 mg Intra-articular Once   metoprolol tartrate  50 mg Oral BID   mupirocin ointment  1 application Nasal BID   polyethylene glycol  17 g Oral Daily   simvastatin  40 mg Oral QPM   sodium chloride flush  3 mL Intravenous Q12H   tamsulosin  0.4 mg Oral Daily   warfarin  2 mg Oral ONCE-1600   Warfarin - Pharmacist Dosing Inpatient   Does not apply q1600   Continuous Infusions:  sodium chloride     ceFEPime (MAXIPIME) IV 2  g (09/12/20 0814)   PRN Meds:.sodium chloride, acetaminophen **OR** acetaminophen, albuterol, guaiFENesin, LORazepam, oxyCODONE-acetaminophen, sodium chloride flush  Antibiotics  :    Anti-infectives (From admission, onward)    Start     Dose/Rate Route Frequency Ordered Stop   09/12/20 1000  doxycycline (VIBRA-TABS) tablet 100 mg        100 mg Oral Every 12 hours 09/11/20 1117 09/14/20 2159   09/11/20 1000  doxycycline (VIBRA-TABS) tablet 100 mg  Status:  Discontinued        100 mg Oral Every 12 hours 09/11/20 0825 09/11/20 1117   09/10/20 2200  ceFEPIme (MAXIPIME) 2 g in sodium chloride 0.9 % 100 mL IVPB         2 g 200 mL/hr over 30 Minutes Intravenous Every 12 hours 09/10/20 0830 09/13/20 2359   09/10/20 1700  vancomycin (VANCOREADY) IVPB 750 mg/150 mL        750 mg 150 mL/hr over 60 Minutes Intravenous Every 24 hours 09/09/20 1608 09/11/20 1852   09/10/20 1500  cefTRIAXone (ROCEPHIN) 2 g in sodium chloride 0.9 % 100 mL IVPB  Status:  Discontinued       Note to Pharmacy: 24 hours from first dose pleaes   2 g 200 mL/hr over 30 Minutes Intravenous Every 24 hours 09/09/20 1421 09/09/20 1510   09/10/20 1000  azithromycin (ZITHROMAX) 500 mg in sodium chloride 0.9 % 250 mL IVPB  Status:  Discontinued        500 mg 250 mL/hr over 60 Minutes Intravenous Every 24 hours 09/09/20 1421 09/09/20 1434   09/09/20 2200  doxycycline (VIBRAMYCIN) 100 mg in sodium chloride 0.9 % 250 mL IVPB  Status:  Discontinued        100 mg 125 mL/hr over 120 Minutes Intravenous Every 12 hours 09/09/20 1435 09/11/20 0825   09/09/20 1600  ceFEPIme (MAXIPIME) 2 g in sodium chloride 0.9 % 100 mL IVPB  Status:  Discontinued        2 g 200 mL/hr over 30 Minutes Intravenous Every 8 hours 09/09/20 1524 09/10/20 0830   09/09/20 1500  vancomycin (VANCOREADY) IVPB 1750 mg/350 mL        1,750 mg 175 mL/hr over 120 Minutes Intravenous  Once 09/09/20 1453 09/09/20 2219   09/09/20 1445  cefTRIAXone (ROCEPHIN) 1 g in sodium chloride 0.9 % 100 mL IVPB  Status:  Discontinued        1 g 200 mL/hr over 30 Minutes Intravenous NOW 09/09/20 1432 09/09/20 1524   09/09/20 1230  cefTRIAXone (ROCEPHIN) 1 g in sodium chloride 0.9 % 100 mL IVPB        1 g 200 mL/hr over 30 Minutes Intravenous  Once 09/09/20 1227 09/09/20 1345   09/09/20 1230  doxycycline (VIBRAMYCIN) 100 mg in sodium chloride 0.9 % 250 mL IVPB        100 mg 125 mL/hr over 120 Minutes Intravenous  Once 09/09/20 1227 09/09/20 1716         Zyann Mabry M.D on 09/12/2020 at 1:43 PM  To page go to [email or web address]   [facility] -  Office  [phone]      See all Orders  from today for further details    Objective:   Vitals:   09/12/20 0400 09/12/20 0700 09/12/20 0802 09/12/20 1208  BP: (!) 192/81  (!) 167/82 (!) 177/67  Pulse: (!) 58  (!) 56 75  Resp: 17 16 16 17   Temp: 97.6 F (36.4 C)  98.5 F (36.9 C)  98.2 F (36.8 C)  TempSrc: Axillary  Oral Oral  SpO2: 96%  92%   Weight:      Height:        Wt Readings from Last 3 Encounters:  09/09/20 81.8 kg  06/14/20 81.8 kg  06/06/20 89.5 kg     Intake/Output Summary (Last 24 hours) at 09/12/2020 1343 Last data filed at 09/12/2020 0852 Gross per 24 hour  Intake 3 ml  Output 1630 ml  Net -1627 ml     Physical Exam  Awake Alert, Oriented X 3, No new F.N deficits, Normal affect Symmetrical Chest wall movement, Good air movement bilaterally, no wheezing RRR,No Gallops,Rubs or new Murmurs, No Parasternal Heave +ve B.Sounds, Abd Soft, No tenderness, No rebound - guarding or rigidity. Bilateral BKA       Data Review:    CBC Recent Labs  Lab 09/09/20 1208 09/10/20 0301 09/11/20 0613 09/12/20 0237  WBC 18.6* 13.2* 10.6* 13.3*  HGB 12.6* 11.6* 13.8 12.6*  HCT 41.0 37.6* 44.6 40.1  PLT 346 317 391 440*  MCV 97.4 95.7 95.1 94.8  MCH 29.9 29.5 29.4 29.8  MCHC 30.7 30.9 30.9 31.4  RDW 15.0 14.7 14.7 14.6  LYMPHSABS 0.5*  --  0.5* 1.0  MONOABS 1.6*  --  0.4 1.2*  EOSABS 0.0  --  0.0 0.1  BASOSABS 0.1  --  0.0 0.1    Recent Labs  Lab 09/09/20 1208 09/09/20 1757 09/10/20 0301 09/10/20 1252 09/11/20 0613 09/12/20 0237  NA 138  --  140  --  138 138  K 3.1*  --  2.6* 4.0 4.4 4.1  CL 97*  --  100  --  100 98  CO2 30  --  31  --  28 30  GLUCOSE 139*  --  62*  --  158* 113*  BUN 7*  --  10  --  12 14  CREATININE 0.77  --  0.58*  --  0.59* 0.62  CALCIUM 8.6*  --  8.4*  --  9.2 9.0  AST 14*  --   --   --  13* 17  ALT 11  --   --   --  13 13  ALKPHOS 48  --   --   --  56 54  BILITOT 1.2  --   --   --  0.9 0.5  ALBUMIN 2.8*  --   --   --  2.8* 2.6*  MG  --  2.0 2.1  --  2.1 2.1   CRP 26.0*  --  29.4*  --  28.5* 13.0*  DDIMER 1.87*  --   --   --   --   --   PROCALCITON  --   --  0.66  --  0.38 0.34  LATICACIDVEN 1.5  --   --   --   --   --   INR 1.3*  --  1.3*  --  1.5* 2.1*  TSH 0.169*  --   --   --  0.515  --   BNP 224.5*  --  180.1*  --  372.5* 323.8*    ------------------------------------------------------------------------------------------------------------------ No results for input(s): CHOL, HDL, LDLCALC, TRIG, CHOLHDL, LDLDIRECT in the last 72 hours.  Lab Results  Component Value Date   HGBA1C 6.5 (H) 06/15/2020   ------------------------------------------------------------------------------------------------------------------ Recent Labs    09/11/20 0613  TSH 0.515    Cardiac Enzymes No results for input(s): CKMB, TROPONINI, MYOGLOBIN in the last 168 hours.  Invalid input(s):  CK ------------------------------------------------------------------------------------------------------------------    Component Value Date/Time   BNP 323.8 (H) 09/12/2020 0237     Radiology Reports CT Head Wo Contrast  Result Date: 09/09/2020 CLINICAL DATA:  Mental status changes. Unknown cause. Patient found in to the left. EXAM: CT HEAD WITHOUT CONTRAST TECHNIQUE: Contiguous axial images were obtained from the base of the skull through the vertex without intravenous contrast. COMPARISON:  CT head without contrast 06/14/2020 and 06/06/2020 FINDINGS: Brain: No acute infarct, hemorrhage, or mass lesion is present. Moderate atrophy and white matter changes are stable. The ventricles are proportionate to the degree of atrophy. No significant extraaxial fluid collection is present. The brainstem and cerebellum are within normal limits. Calcified lesion along a left frontal convexity on image 47 of series 3 is stable, consistent with a small meningioma. Vascular: Cirrhotic changes within the cavernous internal carotid arteries are stable. No hyperdense vessel is present.  Skull: Calvarium is intact. No focal lytic or blastic lesions are present. No significant extracranial soft tissue lesion is present. Sinuses/Orbits: The paranasal sinuses and mastoid air cells are clear. Bilateral lens replacements are noted. Globes and orbits are otherwise unremarkable. IMPRESSION: 1. No acute intracranial abnormality or significant interval change. 2. Stable atrophy and white matter disease. This likely reflects the sequela of chronic microvascular ischemia. 3. Stable small left frontal convexity meningioma. Smaller areas of calcification likely represent dural ossification. Small meningiomas not excluded. Electronically Signed   By: Marin Roberts M.D.   On: 09/09/2020 13:30   DG CHEST PORT 1 VIEW  Result Date: 09/10/2020 CLINICAL DATA:  Cough and shortness of breath. EXAM: PORTABLE CHEST 1 VIEW COMPARISON:  Chest x-ray from yesterday. FINDINGS: Stable cardiomediastinal silhouette. Unchanged retrocardiac left lower lobe consolidation. Improved aeration at the right lung base. No pleural effusion or pneumothorax. No acute osseous abnormality. IMPRESSION: 1. Unchanged left lower lobe pneumonia. Electronically Signed   By: Obie Dredge M.D.   On: 09/10/2020 08:09   DG Chest Port 1 View  Result Date: 09/09/2020 CLINICAL DATA:  Mental status changes and hypoxia. EXAM: PORTABLE CHEST 1 VIEW COMPARISON:  06/14/2020 FINDINGS: Stable mild cardiac enlargement, aortic atherosclerosis and tortuosity of the thoracic aorta. Since the prior study, development asymmetric pulmonary airspace opacity in the left lower lung zone suspicious for pneumonia. There also is subtle increased opacity at the right lung base consistent with atelectasis or infiltrate. No overt edema or significant pleural effusions. No pneumothorax. IMPRESSION: Development of asymmetric opacity in the left lower lung zone suspicious for acute pneumonia. Mild right basilar atelectasis versus infiltrate. Electronically Signed    By: Irish Lack M.D.   On: 09/09/2020 12:09   DG Shoulder Left  Result Date: 09/09/2020 CLINICAL DATA:  Left shoulder pain.  No known trauma. EXAM: LEFT SHOULDER - 2+ VIEW COMPARISON:  Left shoulder radiographs 07/09/2014 FINDINGS: Progressive advanced degenerative changes are noted in the left shoulder. No acute or healing fractures are present. Heart is enlarged. Atherosclerotic changes are noted at the arch. IMPRESSION: 1. Progressive advanced degenerative changes of the left shoulder. 2. No acute abnormality. Electronically Signed   By: Marin Roberts M.D.   On: 09/09/2020 12:12

## 2020-09-12 NOTE — Progress Notes (Signed)
Occupational Therapy Treatment Patient Details Name: Mark Russell MRN: [id number] DOB: March 18, 1935 Today's Date: 09/12/2020    History of present illness 85 y.o. male presented to ED 09/09/20 for AMS and concerns for stroke. +pneumonia, sepsis, Lt shoulder pain (?septic arthritis with MRI pending); ortho consulted with left shoulder aspirated 09/09/20  PMH significant of  PVD, DVT on anticoagulation, a fib, HTN, DM2, COPD, OSA, HLD, bil AKA amputations   OT comments  Patient with good progress toward patient goals.  Shoulder pain is reduced, and he is able to use the left upper extremity as he did prior level.  His mobility has improved well since his last OT session, and is hopeful to return home 9/9.  OT will continue to follow in the acute setting to assist with transfer home.  Follow Up Recommendations  Home health OT;Supervision - Intermittent;Other (comment)    Equipment Recommendations  None recommended by OT    Recommendations for Other Services      Precautions / Restrictions Precautions Precautions: Fall Precaution Comments: L shoulder painimproved - per patient 85% better. Restrictions Weight Bearing Restrictions: No       Mobility Bed Mobility Overal bed mobility: Needs Assistance Bed Mobility: Supine to Sit;Sit to Supine     Supine to sit: Modified independent (Device/Increase time) Sit to supine: Supervision   General bed mobility comments: continues to use SR and foot board to [facility] in bed - states this his how he does it. Patient Response: Cooperative  Transfers                 General transfer comment: declined out of bed, agreed to sit EOB.    Balance Overall balance assessment: Needs assistance Sitting-balance support: No upper extremity supported Sitting balance-Leahy Scale: Good                                     ADL either performed or assessed with clinical judgement   ADL       Grooming: Wash/dry hands;Wash/dry  face;Set up;Sitting           Upper Body Dressing : Set up;Sitting   Lower Body Dressing: Supervision/safety;Sitting/lateral leans                 General ADL Comments: able to lean from side to side this date with no shoulder discomfort reported.                       Cognition Arousal/Alertness: Awake/alert Behavior During Therapy: WFL for tasks assessed/performed                             Safety/Judgement: Decreased awareness of deficits     General Comments: patient appears to be at his baseline.  Discussing details about his home life and his dog - pepples.                          Pertinent Vitals/ Pain       Faces Pain Scale: Hurts a little bit Pain Location: left shoulder Pain Descriptors / Indicators: Tender Pain Intervention(s): Monitored during session  Frequency  Min 2X/week        Progress Toward Goals  OT Goals(current goals can now be found in the care plan section)  Progress towards OT goals: Progressing toward goals  Acute Rehab OT Goals Patient Stated Goal: I'm ready to go home Time For Goal Achievement: 09/28/20 Potential to Achieve Goals: Good  Plan Discharge plan remains appropriate    Co-evaluation                 AM-PAC OT "6 Clicks" Daily Activity     Outcome Measure   Help from another person eating meals?: None Help from another person taking care of personal grooming?: None Help from another person toileting, which includes using toliet, bedpan, or urinal?: A Little Help from another person bathing (including washing, rinsing, drying)?: A Little Help from another person to put on and taking off regular upper body clothing?: None Help from another person to put on and taking off regular lower body clothing?: A Little 6 Click Score: 21    End of Session    OT Visit Diagnosis: Unsteadiness on feet  (R26.81);Muscle weakness (generalized) (M62.81);Pain Pain - Right/Left: Left Pain - part of body: Shoulder   Activity Tolerance Patient tolerated treatment well   Patient Left in bed;with call bell/phone within reach;with nursing/sitter in room   Nurse Communication          Time: 2355-7322 OT Time Calculation (min): 18 min  Charges: OT General Charges $OT Visit: 1 Visit OT Treatments $Self Care/Home Management : 8-22 mins  09/12/2020  RP, OTR/L  Acute Rehabilitation Services  Office:  [phone]    Mark Russell 09/12/2020, 12:23 PM

## 2020-09-12 NOTE — Progress Notes (Signed)
PT Cancellation Note  Patient Details Name: Mark Russell MRN: [id number] DOB: [birth year]   Cancelled Treatment:    Reason Eval/Treat Not Completed: PT screened, no needs identified, will sign off  Patient demonstrated transfer on 9/7 during PT and met all goals. Mobilized already today with OT. No further needs identified and PT is signing off.   PT Discharge Note  Patient is being discharged from PT services secondary to:  Goals met and no further therapy needs identified.  Please see latest Therapy Progress Note for current level of functioning and progress toward goals.  Progress and discharge plan and discussed with patient/caregiver and they  Agree   Mark Russell, PT Pager [phone]   Rexanne Mano 09/12/2020, 2:55 PM

## 2020-09-12 NOTE — Care Management Important Message (Signed)
Important Message  Patient Details  Name: Mark Russell MRN: [id number] Date of Birth: January 10, 1935   Medicare Important Message Given:  Yes     Jona Erkkila Stefan Church 09/12/2020, 3:16 PM

## 2020-09-13 DIAGNOSIS — E1151 Type 2 diabetes mellitus with diabetic peripheral angiopathy without gangrene: Secondary | ICD-10-CM

## 2020-09-13 DIAGNOSIS — I70209 Unspecified atherosclerosis of native arteries of extremities, unspecified extremity: Secondary | ICD-10-CM

## 2020-09-13 DIAGNOSIS — I480 Paroxysmal atrial fibrillation: Secondary | ICD-10-CM

## 2020-09-13 LAB — GLUCOSE, CAPILLARY
Glucose-Capillary: 133 mg/dL — ABNORMAL HIGH (ref 70–99)
Glucose-Capillary: 101 mg/dL — ABNORMAL HIGH (ref 70–99)

## 2020-09-13 LAB — BODY FLUID CULTURE W GRAM STAIN: Culture: NO GROWTH

## 2020-09-13 MED ORDER — AMOXICILLIN-POT CLAVULANATE 875-125 MG PO TABS: 1.0000 | ORAL_TABLET | Freq: Two times a day (BID) | ORAL | 0 refills | Status: AC

## 2020-09-13 MED ORDER — AMLODIPINE BESYLATE 10 MG PO TABS: 10.0000 mg | ORAL_TABLET | Freq: Every day | ORAL | 0 refills | Status: AC

## 2020-09-13 MED ORDER — WARFARIN SODIUM 2 MG PO TABS
2.0000 mg | ORAL_TABLET | Freq: Every day | ORAL | Status: DC
  Filled 2020-09-13: qty 1

## 2020-09-13 MED ORDER — CYANOCOBALAMIN 500 MCG PO TABS: 500.0000 ug | ORAL_TABLET | Freq: Every day | ORAL | 1 refills | Status: AC

## 2020-09-13 MED ORDER — HYDRALAZINE HCL 25 MG PO TABS: 25.0000 mg | ORAL_TABLET | Freq: Four times a day (QID) | ORAL | 0 refills | Status: DC

## 2020-09-13 MED ORDER — COLCHICINE 0.6 MG PO TABS: 0.3000 mg | ORAL_TABLET | Freq: Every day | ORAL | 0 refills | Status: DC

## 2020-09-13 NOTE — TOC Initial Note (Signed)
Transition of Care [facility]) - Initial/Assessment Note    Patient Details  Name: Mark Russell MRN: [id number] Date of Birth: Jan 10, 1935  Transition of Care [facility]) CM/SW Contact:    Mearl Latin, LCSW Phone Number: 09/13/2020, 1:59 PM  Clinical Narrative:                 CSW received consult for possible home health services at time of discharge. CSW spoke with patient's daughter, Asher Muir, as patient gets confused. She reported that family provides care for him and he would like home health services. Patient reports no agency preference but has used [facility] in the past. RNCM to send referral for review. Patient uses a motorized wheelchair at home; no other DME recommended. CSW confirmed PCP and address. Asher Muir stated patient's son will pick patient up after he gets off of work. CSW encouraged them to call RN to let them know timing. She asked if CSW could contact [facility] to see if they are rescheduling patient's visit. CSW alerted Garner Gavel. With [facility] and they will reschedule appointment. No further questions reported at this time.    Expected Discharge Plan: Home w Home Health Services Barriers to Discharge: No Barriers Identified   Patient Goals and CMS Choice Patient states their goals for this hospitalization and ongoing recovery are:: Return home CMS Medicare.gov Compare Post Acute Care list provided to:: Patient Represenative (must comment) Choice offered to / list presented to : Adult Children  Expected Discharge Plan and Services Expected Discharge Plan: Home w Home Health Services In-house Referral: Hospice / Palliative Care Discharge Planning Services: CM Consult Post Acute Care Choice: Home Health Living arrangements for the past 2 months: Single Family Home Expected Discharge Date: 09/13/20               DME Arranged: N/A         HH Arranged: PT, OT          Prior Living Arrangements/Services Living arrangements for the past 2 months: Single Family  Home Lives with:: Adult Children Patient language and need for interpreter reviewed:: Yes Do you feel safe going back to the place where you live?: Yes      Need for Family Participation in Patient Care: Yes (Comment) Care giver support system in place?: Yes (comment) Current home services: DME (WC) Criminal Activity/Legal Involvement Pertinent to Current Situation/Hospitalization: No - Comment as needed  Activities of Daily Living Home Assistive Devices/Equipment: Electric scooter, Dentures (specify type) ADL Screening (condition at time of admission) Patient's cognitive ability adequate to safely complete daily activities?: Yes Is the patient deaf or have difficulty hearing?: No Does the patient have difficulty seeing, even when wearing glasses/contacts?: No Does the patient have difficulty concentrating, remembering, or making decisions?: No Patient able to express need for assistance with ADLs?: No Does the patient have difficulty dressing or bathing?: No Independently performs ADLs?: Yes (appropriate for developmental age) Does the patient have difficulty walking or climbing stairs?: Yes Weakness of Legs: None (Bilateral AKA) Weakness of Arms/Hands: Left  Permission Sought/Granted Permission sought to share information with : Facility Medical sales representative, Family Supports Permission granted to share information with : Yes, Verbal Permission Granted  Share Information with NAME: Asher Muir  Permission granted to share info w AGENCY: HH  Permission granted to share info w Relationship: Daughter  Permission granted to share info w Contact Information: [phone]  Emotional Assessment Appearance:: Appears stated age Attitude/Demeanor/Rapport:  (Appropriate) Affect (typically observed): Appropriate Orientation: : Oriented to Self, Oriented to  Place, Oriented to  Time Alcohol / Substance Use: Not Applicable Psych Involvement: No (comment)  Admission diagnosis:  Diabetes type 2  with atherosclerosis of arteries of extremities (HCC) [E11.51, I70.209] Sepsis due to pneumonia (HCC) [J18.9, A41.9] Community acquired pneumonia of left lower lobe of lung [J18.9] Patient Active Problem List   Diagnosis Date Noted   Hypokalemia 09/09/2020   Sepsis due to pneumonia (HCC) 09/09/2020   Left shoulder pain 09/09/2020   Cellulitis of multiple sites of scalp and neck 06/17/2020   Face lesion 06/15/2020   Physical deconditioning 06/15/2020   PAF (paroxysmal atrial fibrillation) (HCC) 06/15/2020   HLD (hyperlipidemia) 06/15/2020   HTN (hypertension) 06/15/2020   Lung nodule 06/15/2020   Chronic pain 06/15/2020   Supratherapeutic INR 06/14/2020   COVID-19 06/06/2020   Bronchitis 08/09/2015   OSA (obstructive sleep apnea) 11/17/2012   COPD (chronic obstructive pulmonary disease) (HCC) 11/10/2012   Dysphagia 03/29/2012    Class: Acute   Chest pain 03/29/2012   Diabetes type 2 with atherosclerosis of arteries of extremities (HCC) 03/29/2012   Acute respiratory failure (HCC) 08/09/2011   Hypoxemia 08/09/2011   Respiratory acidosis 08/09/2011   ARF (acute renal failure) (HCC) 08/07/2011   Hypoxia 08/06/2011   Leukocytosis 08/06/2011   Sepsis(995.91) 08/06/2011   PVD (peripheral vascular disease) (HCC) 08/06/2011   S/P AKA (above knee amputation) bilateral (HCC) 08/06/2011   DVT (deep venous thrombosis) (HCC) 08/06/2011   PCP:  Maurice Small, MD Pharmacy:   [facility] [phone] Ginette Otto, [place] - [address] AT [facility] [address] [place] [address] Phone: [phone] Fax: [phone]  [facility] - [place], [place] - [address] [employer]. [address] [place] [address] Phone: [phone] Fax: [phone]  [id number] - [place], [place] - [address] [place] [address] Phone: [phone] Fax: [phone]     Social Determinants of Health (SDOH)  Interventions    Readmission Risk Interventions No flowsheet data found.

## 2020-09-13 NOTE — Progress Notes (Signed)
ANTICOAGULATION CONSULT NOTE - Pharmacy Consult for Warfarin  Indication: atrial fibrillation; Hx of DVT  Allergies  Allergen Reactions   Ace Inhibitors     hypotension   Dilaudid [Hydromorphone Hcl]     "Mean"   Metformin And Related     GI upset   Morphine And Related Other (See Comments)    "mean"   Pletal [Cilostazol]     SOB    Patient Measurements:  Vital Signs: Temp: 97.8 F (36.6 C) (09/09 0759) Temp Source: Oral (09/09 0759) BP: 163/105 (09/09 0852) Pulse Rate: 64 (09/09 0852)  Labs: Recent Labs    09/11/20 0613 09/12/20 0237  HGB 13.8 12.6*  HCT 44.6 40.1  PLT 391 440*  LABPROT 17.8* 23.2*  INR 1.5* 2.1*  CREATININE 0.59* 0.62     Estimated Creatinine Clearance: 45.7 mL/min (by C-G formula based on SCr of 0.62 mg/dL).   Assessment: Patient with a history of PVD s/p B/L AKA (2007), DVT, a fib (on coumadin), HTN, DM2, COVID PNA (06/01/20) with hypoxia. Patient presenting with AMS.  Patient is on warfarin prior to arrival. Patient taking 2mg  daily - last taken 9/4.   Goal of Therapy:  INR 2-3 Monitor platelets by anticoagulation protocol: Yes Appropriate Lovenox dosing   Plan:  Warfarin 2 mg po x 1 dose tonight Follow up AM INR  Thank you 11/4, PharmD  09/13/2020 9:33 AM

## 2020-09-13 NOTE — Progress Notes (Signed)
Discharge education and packet provided to patient's son, all questions and concerns addressed, pt agreeable with discharge today.

## 2020-09-13 NOTE — TOC Transition Note (Signed)
Transition of Care [facility]) - CM/SW Discharge Note   Patient Details  Name: Lindell Renfrew MRN: [id number] Date of Birth: 09/25/1935  Transition of Care [facility]) CM/SW Contact:  Lockie Pares, RN Phone Number: 09/13/2020, 3:15 PM   Clinical Narrative:     Iantha Fallen ( amy) accepted fo home health. Patient was a little confused, CSW spoke with daughter who chose [facility] ( [facility]) NO further needs identified.   Final next level of care: Home w Home Health Services Barriers to Discharge: No Barriers Identified   Patient Goals and CMS Choice Patient states their goals for this hospitalization and ongoing recovery are:: Return home CMS Medicare.gov Compare Post Acute Care list provided to:: Patient Represenative (must comment) Choice offered to / list presented to : Adult Children  Discharge Placement                Patient to be transferred to facility by: car Name of family member notified: Daughter Patient and family notified of of transfer: 09/13/20  Discharge Plan and Services In-house Referral: Hospice / Palliative Care Discharge Planning Services: CM Consult Post Acute Care Choice: Home Health          DME Arranged: N/A         HH Arranged: PT, OT HH Agency: [facility] Date [facility] Agency Contacted: 09/13/20 Time HH Agency Contacted: 1500 Representative spoke with at [facility] Agency: [facility]  Social Determinants of Health (SDOH) Interventions     Readmission Risk Interventions No flowsheet data found.

## 2020-09-13 NOTE — Consult Note (Signed)
   [facility] CM Inpatient Consult   09/13/2020  Mark Russell [birth year] [id number]  [facility] [[facility]]  Accountable Care Organization [ACO] Patient: [name] Medicare  Primary Care Provider:  Maurice Small, MD, [facility] Medicine at [facility]   Patient screened for high risk score for unplanned readmission risk and to assess for potential [facility]  [[facility]] Care Management service needs for post hospital transition.  Review of patient's medical record reveals patient is for home with family per [facility] LCSW notes. Patient active with [facility] team.    Plan:  Continue to follow progress and disposition to assess for post hospital care management needs.    For questions contact:   Charlesetta Shanks, RN BSN CCM [facility]  [phone] business mobile phone Toll free office [phone]  Fax number: [phone] [name]  '

## 2020-09-13 NOTE — Discharge Summary (Signed)
Physician Discharge Summary  Isaac Dubie ZOX:[id number] DOB: 24-Mar-1935 DOA: 09/09/2020  PCP: Maurice Small, MD  Admit date: 09/09/2020 Discharge date: 09/13/2020  Admitted From: Home Disposition:  Home   Recommendations for Outpatient Follow-up:  Follow up with PCP in 1-2 weeks Please obtain BMP/CBC in one week Please check B12 level in 8 weeks   Home Health:NO  Discharge Condition:Stable CODE STATUS:FULL, Diet recommendation: Heart Healthy / Carb Modified    Brief/Interim Summary:  - Kline Bulthuis is a 85 y.o. male with medical history significant of  PVD, DVT on anticoagulation, a fib, HTN, DM2, COPD, OSA, HLD who presented to ED for AMS and concerns for stroke by family.  His ER work-up was suggestive of community-acquired pneumonia along with left shoulder inflammation and he was admitted to the hospital.  Head CT was unremarkable he had no focal deficits.  CAP causing Sepsis and toxic encephalopathy and precipitation of pseudogout  -Treated with broad-spectrum antibiotic, vancomycin and cefepime, on IV vancomycin given MRSA PCR screen positive, patient will be discharged on 3 days of oral Augmentin to finish total of 7 days treatment. -I have encouraged him to use incentive erectly and flutter valve at home   Left shoulder pain./Pseudogout - seen by orthopedics, underwent arthrocentesis on 09/09/2020 fluid so far suggestive of pseudogout related inflammation, not consistent with infectious arthritis.  Have added colchicine to reduce inflammation, he did receive intra-articular steroid shots.  To be discharged on 10 days of oral colchicine. -Patient reports shoulder pain significantly improved.   Toxic encephalopathy.  Resolved.  Head CT negative no focal deficits caused by sepsis and inflammation from pseudogout in the left shoulder joint. -Resolved    Hypokalemia.  Replaced.   B12 deficiency,  -b12 level low at 121 -Started on IM supplements, transitioned to p.o. on  discharge     Bilateral BKA.  Homebound lives on a wheelchair with close supervision from family, family would prefer taking him back home if possible.   Acute hypoxic respiratory failure.  Due to pneumonia resolved.   Morbid obesity with OSA.  BMI of 40.  Not on CPAP at home, with family, follow with PCP for weight loss.   Dyslipidemia.  On statin.   History of COPD.  Supportive care no acute issues.   PAD.  On treatment within an statin for secondary prevention.   Paroxysmal atrial fibrillation.  [place] vas 2 score of greater than 3.  On beta-blocker and Coumadin    HTN - On Norvasc + B Blocker.  Blood pressure remains significantly uncontrolled,      Chronic Pain - Home Rx.   BPH - Flomax   Mild cognitive decline.  On Aricept, at risk for delirium.  Monitor.     DM 2 - ISS during hospital stay, resume home medications on discharge    Discussed with son by phone, he was updated at time of discharge.    Discharge Diagnoses:  Principal Problem:   Sepsis due to pneumonia [facility]) Active Problems:   PVD (peripheral vascular disease) (HCC)   DVT (deep venous thrombosis) (HCC)   Acute respiratory failure (HCC)   Diabetes type 2 with atherosclerosis of arteries of extremities (HCC)   COPD (chronic obstructive pulmonary disease) (HCC)   OSA (obstructive sleep apnea)   PAF (paroxysmal atrial fibrillation) (HCC)   HLD (hyperlipidemia)   HTN (hypertension)   Chronic pain   Hypokalemia   Left shoulder pain    Discharge Instructions  Discharge Instructions     Diet -  low sodium heart healthy   Complete by: As directed    Discharge instructions   Complete by: As directed    Follow with Primary MD Maurice Small, MD in 7 days   Get CBC, CMP,  checked  by Primary MD next visit.    Activity: As tolerated with Full fall precautions use walker/cane & assistance as needed   Disposition Home    Diet: Heart Healthy /carb modifed , with feeding assistance and aspiration  precautions.  For Heart failure patients - Check your Weight same time everyday, if you gain over 2 pounds, or you develop in leg swelling, experience more shortness of breath or chest pain, call your Primary MD immediately. Follow Cardiac Low Salt Diet and 1.5 lit/day fluid restriction.   On your next visit with your primary care physician please Get Medicines reviewed and adjusted.   Please request your Prim.MD to go over all Hospital Tests and Procedure/Radiological results at the follow up, please get all Hospital records sent to your Prim MD by signing hospital release before you go home.   If you experience worsening of your admission symptoms, develop shortness of breath, life threatening emergency, suicidal or homicidal thoughts you must seek medical attention immediately by calling 911 or calling your MD immediately  if symptoms less severe.  You Must read complete instructions/literature along with all the possible adverse reactions/side effects for all the Medicines you take and that have been prescribed to you. Take any new Medicines after you have completely understood and accpet all the possible adverse reactions/side effects.   Do not drive, operating heavy machinery, perform activities at heights, swimming or participation in water activities or provide baby sitting services if your were admitted for syncope or siezures until you have seen by Primary MD or a Neurologist and advised to do so again.  Do not drive when taking Pain medications.    Do not take more than prescribed Pain, Sleep and Anxiety Medications  Special Instructions: If you have smoked or chewed Tobacco  in the last 2 yrs please stop smoking, stop any regular Alcohol  and or any Recreational drug use.  Wear Seat belts while driving.   Please note  You were cared for by a hospitalist during your hospital stay. If you have any questions about your discharge medications or the care you received while you  were in the hospital after you are discharged, you can call the unit and asked to speak with the hospitalist on call if the hospitalist that took care of you is not available. Once you are discharged, your primary care physician will handle any further medical issues. Please note that NO REFILLS for any discharge medications will be authorized once you are discharged, as it is imperative that you return to your primary care physician (or establish a relationship with a primary care physician if you do not have one) for your aftercare needs so that they can reassess your need for medications and monitor your lab values.   Increase activity slowly   Complete by: As directed    No wound care   Complete by: As directed       Allergies as of 09/13/2020       Reactions   Ace Inhibitors    hypotension   Dilaudid [hydromorphone Hcl]    "Mean"   Metformin And Related    GI upset   Morphine And Related Other (See Comments)   "mean"   Pletal [cilostazol]    SOB  Medication List     TAKE these medications    acetaminophen 325 MG tablet Commonly known as: TYLENOL Take 650 mg by mouth every 6 (six) hours as needed for mild pain, fever or headache.   albuterol 108 (90 Base) MCG/ACT inhaler Commonly known as: VENTOLIN HFA Inhale 2 puffs into the lungs every 6 (six) hours as needed for wheezing or shortness of breath.   amLODipine 10 MG tablet Commonly known as: NORVASC Take 1 tablet (10 mg total) by mouth daily. Start taking on: September 14, 2020 What changed:  medication strength how much to take Another medication with the same name was removed. Continue taking this medication, and follow the directions you see here.   amoxicillin-clavulanate 875-125 MG tablet Commonly known as: Augmentin Take 1 tablet by mouth 2 (two) times daily for 10 days.   bismuth subsalicylate 262 MG/15ML suspension Commonly known as: PEPTO BISMOL Take 30 mLs by mouth every 6 (six) hours as needed  for diarrhea or loose stools.   colchicine 0.6 MG tablet Take 0.5 tablets (0.3 mg total) by mouth daily. Start taking on: September 14, 2020   donepezil 10 MG tablet Commonly known as: ARICEPT Take 10 mg by mouth at bedtime.   DULoxetine 30 MG capsule Commonly known as: CYMBALTA Take 30 mg by mouth daily.   fentaNYL 25 MCG/HR Commonly known as: DURAGESIC Place 1 patch onto the skin every 3 (three) days.   FLUoxetine 20 MG capsule Commonly known as: PROZAC Take 40 mg by mouth daily.   gabapentin 300 MG capsule Commonly known as: NEURONTIN Take 300 mg by mouth 3 (three) times daily.   glimepiride 4 MG tablet Commonly known as: AMARYL Take 4 mg by mouth daily with breakfast.   guaiFENesin-dextromethorphan 100-10 MG/5ML syrup Commonly known as: ROBITUSSIN DM Take 10 mLs by mouth every 4 (four) hours as needed for cough.   hydrALAZINE 25 MG tablet Commonly known as: APRESOLINE Take 1 tablet (25 mg total) by mouth 4 (four) times daily.   metoprolol tartrate 25 MG tablet Commonly known as: LOPRESSOR Take 1 tablet (25 mg total) by mouth 2 (two) times daily.   mineral oil-hydrophilic petrolatum ointment Apply 1 application topically daily as needed (bed sores).   Percocet 7.5-325 MG tablet Generic drug: oxyCODONE-acetaminophen Take 1 tablet by mouth every 6 (six) hours as needed for pain.   potassium chloride 10 MEQ tablet Commonly known as: KLOR-CON Take 1 tablet (10 mEq total) by mouth 2 (two) times daily for 14 days.   simvastatin 40 MG tablet Commonly known as: ZOCOR Take 40 mg by mouth every evening.   Symbicort 160-4.5 MCG/ACT inhaler Generic drug: budesonide-formoterol Inhale 3 puffs into the lungs every 6 (six) hours as needed for wheezing or shortness of breath.   tamsulosin 0.4 MG Caps capsule Commonly known as: FLOMAX Take 0.4 mg by mouth daily.   vitamin B-12 500 MCG tablet Commonly known as: CYANOCOBALAMIN Take 1 tablet (500 mcg total) by mouth  daily.   VITAMIN C PO Take 1 tablet by mouth daily.   Vitamin D 50 MCG (2000 UT) tablet Take 2,000 Units by mouth daily.   warfarin 4 MG tablet Commonly known as: COUMADIN Take 2 mg by mouth daily.   zinc gluconate 50 MG tablet Take 50 mg by mouth daily.        Follow-up Information     Maurice Small, MD Follow up in 1 week(s).   Specialty: Family Medicine Contact information: [address] [place] [address] [phone]                Allergies  Allergen Reactions   Ace Inhibitors     hypotension   Dilaudid [Hydromorphone Hcl]     "Mean"   Metformin And Related     GI upset   Morphine And Related Other (See Comments)    "mean"   Pletal [Cilostazol]     SOB    Consultations:  Ortho  L.  Shoulder arthrocentesis consistent with pseudogout.  And intra-articular steroid injection  Procedures/Studies: CT Head Wo Contrast  Result Date: 09/09/2020 CLINICAL DATA:  Mental status changes. Unknown cause. Patient found in to the left. EXAM: CT HEAD WITHOUT CONTRAST TECHNIQUE: Contiguous axial images were obtained from the base of the skull through the vertex without intravenous contrast. COMPARISON:  CT head without contrast 06/14/2020 and 06/06/2020 FINDINGS: Brain: No acute infarct, hemorrhage, or mass lesion is present. Moderate atrophy and white matter changes are stable. The ventricles are proportionate to the degree of atrophy. No significant extraaxial fluid collection is present. The brainstem and cerebellum are within normal limits. Calcified lesion along a left frontal convexity on image 47 of series 3 is stable, consistent with a small meningioma. Vascular: Cirrhotic changes within the cavernous internal carotid arteries are stable. No hyperdense vessel is present. Skull: Calvarium is intact. No focal lytic or blastic lesions are present. No significant extracranial soft tissue lesion is present. Sinuses/Orbits: The paranasal sinuses and  mastoid air cells are clear. Bilateral lens replacements are noted. Globes and orbits are otherwise unremarkable. IMPRESSION: 1. No acute intracranial abnormality or significant interval change. 2. Stable atrophy and white matter disease. This likely reflects the sequela of chronic microvascular ischemia. 3. Stable small left frontal convexity meningioma. Smaller areas of calcification likely represent dural ossification. Small meningiomas not excluded. Electronically Signed   By: Marin Roberts M.D.   On: 09/09/2020 13:30   DG CHEST PORT 1 VIEW  Result Date: 09/10/2020 CLINICAL DATA:  Cough and shortness of breath. EXAM: PORTABLE CHEST 1 VIEW COMPARISON:  Chest x-ray from yesterday. FINDINGS: Stable cardiomediastinal silhouette. Unchanged retrocardiac left lower lobe consolidation. Improved aeration at the right lung base. No pleural effusion or pneumothorax. No acute osseous abnormality. IMPRESSION: 1. Unchanged left lower lobe pneumonia. Electronically Signed   By: Obie Dredge M.D.   On: 09/10/2020 08:09   DG Chest Port 1 View  Result Date: 09/09/2020 CLINICAL DATA:  Mental status changes and hypoxia. EXAM: PORTABLE CHEST 1 VIEW COMPARISON:  06/14/2020 FINDINGS: Stable mild cardiac enlargement, aortic atherosclerosis and tortuosity of the thoracic aorta. Since the prior study, development asymmetric pulmonary airspace opacity in the left lower lung zone suspicious for pneumonia. There also is subtle increased opacity at the right lung base consistent with atelectasis or infiltrate. No overt edema or significant pleural effusions. No pneumothorax. IMPRESSION: Development of asymmetric opacity in the left lower lung zone suspicious for acute pneumonia. Mild right basilar atelectasis versus infiltrate. Electronically Signed   By: Irish Lack M.D.   On: 09/09/2020 12:09   DG Shoulder Left  Result Date: 09/09/2020 CLINICAL DATA:  Left shoulder pain.  No known trauma. EXAM: LEFT SHOULDER - 2+  VIEW COMPARISON:  Left shoulder radiographs 07/09/2014 FINDINGS: Progressive advanced degenerative changes are noted in the left shoulder. No acute or healing fractures are present. Heart is enlarged. Atherosclerotic changes are noted at the arch. IMPRESSION: 1. Progressive advanced degenerative changes of the left shoulder. 2. No acute abnormality. Electronically  Signed   By: Marin Roberts M.D.   On: 09/09/2020 12:12      Subjective:  Patient denies any complaints today, is excited about going home. Discharge Exam: Vitals:   09/13/20 0852 09/13/20 1159  BP: (!) 163/105 (!) 158/70  Pulse: 64 60  Resp:  18  Temp:  97.7 F (36.5 C)  SpO2:  90%   Vitals:   09/13/20 0513 09/13/20 0759 09/13/20 0852 09/13/20 1159  BP: (!) 156/79 (!) 153/58 (!) 163/105 (!) 158/70  Pulse:  60 64 60  Resp: 19 20  18   Temp: 98 F (36.7 C) 97.8 F (36.6 C)  97.7 F (36.5 C)  TempSrc: Oral Oral  Oral  SpO2: 91%   90%  Weight:      Height:        General: Pt is alert, awake, not in acute distress Cardiovascular: RRR, S1/S2 +, no rubs, no gallops Respiratory: CTA bilaterally, no wheezing, no rhonchi Abdominal: Soft, NT, ND, bowel sounds + Extremities: no edema, bilateral BKA    The results of significant diagnostics from this hospitalization (including imaging, microbiology, ancillary and laboratory) are listed below for reference.     Microbiology: Recent Results (from the past 240 hour(s))  Urine Culture     Status: Abnormal   Collection Time: 09/09/20 11:17 AM   Specimen: Urine, Clean Catch  Result Value Ref Range Status   Specimen Description URINE, CLEAN CATCH  Final   Special Requests   Final    NONE Performed at [facility], [address], [place], [place] [address]    Culture MULTIPLE SPECIES PRESENT, SUGGEST RECOLLECTION (A)  Final   Report Status 09/10/2020 FINAL  Final  Resp Panel by RT-PCR (Flu A&B, Covid) Nasopharyngeal Swab     Status: None   Collection Time:  09/09/20 11:44 AM   Specimen: Nasopharyngeal Swab; Nasopharyngeal(NP) swabs in vial transport medium  Result Value Ref Range Status   SARS Coronavirus 2 by RT PCR NEGATIVE NEGATIVE Final    Comment: (NOTE) SARS-CoV-2 target nucleic acids are NOT DETECTED.  The SARS-CoV-2 RNA is generally detectable in upper respiratory specimens during the acute phase of infection. The lowest concentration of SARS-CoV-2 viral copies this assay can detect is 138 copies/mL. A negative result does not preclude SARS-Cov-2 infection and should not be used as the sole basis for treatment or other patient management decisions. A negative result may occur with  improper specimen collection/handling, submission of specimen other than nasopharyngeal swab, presence of viral mutation(s) within the areas targeted by this assay, and inadequate number of viral copies(<138 copies/mL). A negative result must be combined with clinical observations, patient history, and epidemiological information. The expected result is Negative.  Fact Sheet for Patients:  BloggerCourse.com  Fact Sheet for Healthcare Providers:  SeriousBroker.it  This test is no t yet approved or cleared by the [place] FDA and  has been authorized for detection and/or diagnosis of SARS-CoV-2 by FDA under an Emergency Use Authorization (EUA). This EUA will remain  in effect (meaning this test can be used) for the duration of the COVID-19 declaration under Section 564(b)(1) of the Act, 21 U.S.C.section 360bbb-3(b)(1), unless the authorization is terminated  or revoked sooner.       Influenza A by PCR NEGATIVE NEGATIVE Final   Influenza B by PCR NEGATIVE NEGATIVE Final    Comment: (NOTE) The Xpert Xpress SARS-CoV-2/FLU/RSV plus assay is intended as an aid in the diagnosis of influenza from Nasopharyngeal swab specimens and  should not be used as a sole basis for treatment. Nasal washings  and aspirates are unacceptable for Xpert Xpress SARS-CoV-2/FLU/RSV testing.  Fact Sheet for Patients: BloggerCourse.com  Fact Sheet for Healthcare Providers: SeriousBroker.it  This test is not yet approved or cleared by the [place] FDA and has been authorized for detection and/or diagnosis of SARS-CoV-2 by FDA under an Emergency Use Authorization (EUA). This EUA will remain in effect (meaning this test can be used) for the duration of the COVID-19 declaration under Section 564(b)(1) of the Act, 21 U.S.C. section 360bbb-3(b)(1), unless the authorization is terminated or revoked.  Performed at [facility], [address], [place], [place] [address]   Blood culture (routine x 2)     Status: None (Preliminary result)   Collection Time: 09/09/20 11:45 AM   Specimen: BLOOD  Result Value Ref Range Status   Specimen Description BLOOD RIGHT ANTECUBITAL  Final   Special Requests   Final    BOTTLES DRAWN AEROBIC AND ANAEROBIC Blood Culture results may not be optimal due to an inadequate volume of blood received in culture bottles   Culture   Final    NO GROWTH 4 DAYS Performed at [facility], [address] [place], [place] [address]    Report Status PENDING  Incomplete  Blood culture (routine x 2)     Status: None (Preliminary result)   Collection Time: 09/09/20 12:08 PM   Specimen: Site Not Specified; Blood  Result Value Ref Range Status   Specimen Description SITE NOT SPECIFIED  Final   Special Requests   Final    BOTTLES DRAWN AEROBIC AND ANAEROBIC Blood Culture adequate volume   Culture   Final    NO GROWTH 4 DAYS Performed at [facility], [address], [place], [place] [address]    Report Status PENDING  Incomplete  Body fluid culture w Gram Stain     Status: None   Collection Time: 09/09/20  5:07 PM   Specimen: Body Fluid  Result Value Ref Range Status   Specimen Description FLUID  Final    Special Requests JOINT LEFT SHOULDER  Final   Gram Stain   Final    ABUNDANT WBC PRESENT, PREDOMINANTLY PMN NO ORGANISMS SEEN    Culture   Final    NO GROWTH Performed at [facility], [address], [place], [place] [address]    Report Status 09/13/2020 FINAL  Final  MRSA Next Gen by PCR, Nasal     Status: Abnormal   Collection Time: 09/10/20  7:40 AM   Specimen: Nasal Mucosa; Nasal Swab  Result Value Ref Range Status   MRSA by PCR Next Gen DETECTED (A) NOT DETECTED Final    Comment: RESULT CALLED TO, READ BACK BY AND VERIFIED WITH: Festus Aloe RN, AT 1024 09/10/20 D. VANHOOK (NOTE) The GeneXpert MRSA Assay (FDA approved for NASAL specimens only), is one component of a comprehensive MRSA colonization surveillance program. It is not intended to diagnose MRSA infection nor to guide or monitor treatment for MRSA infections. Test performance is not FDA approved in patients less than 14 years old. Performed at [facility], [address] [place], [place] [address]      Labs: BNP (last 3 results) Recent Labs    09/10/20 0301 09/11/20 0613 09/12/20 0237  BNP 180.1* 372.5* 323.8*   Basic Metabolic Panel: Recent Labs  Lab 09/09/20 1208 09/09/20 1757 09/10/20 0301 09/10/20 1252 09/11/20 2025 09/12/20 4270  NA 138  --  140  --  138 138  K 3.1*  --  2.6* 4.0 4.4 4.1  CL 97*  --  100  --  100 98  CO2 30  --  31  --  28 30  GLUCOSE 139*  --  62*  --  158* 113*  BUN 7*  --  10  --  12 14  CREATININE 0.77  --  0.58*  --  0.59* 0.62  CALCIUM 8.6*  --  8.4*  --  9.2 9.0  MG  --  2.0 2.1  --  2.1 2.1   Liver Function Tests: Recent Labs  Lab 09/09/20 1208 09/11/20 0613 09/12/20 0237  AST 14* 13* 17  ALT 11 13 13   ALKPHOS 48 56 54  BILITOT 1.2 0.9 0.5  PROT 6.8 7.5 6.7  ALBUMIN 2.8* 2.8* 2.6*   No results for input(s): LIPASE, AMYLASE in the last 168 hours. No results for input(s): AMMONIA in the last 168 hours. CBC: Recent Labs  Lab 09/09/20 1208  09/10/20 0301 09/11/20 0613 09/12/20 0237  WBC 18.6* 13.2* 10.6* 13.3*  NEUTROABS 16.3*  --  9.6* 10.8*  HGB 12.6* 11.6* 13.8 12.6*  HCT 41.0 37.6* 44.6 40.1  MCV 97.4 95.7 95.1 94.8  PLT 346 317 391 440*   Cardiac Enzymes: No results for input(s): CKTOTAL, CKMB, CKMBINDEX, TROPONINI in the last 168 hours. BNP: Invalid input(s): POCBNP CBG: Recent Labs  Lab 09/12/20 1204 09/12/20 1602 09/12/20 2030 09/13/20 0807 09/13/20 1200  GLUCAP 90 135* 109* 133* 101*   D-Dimer No results for input(s): DDIMER in the last 72 hours. Hgb A1c No results for input(s): HGBA1C in the last 72 hours. Lipid Profile No results for input(s): CHOL, HDL, LDLCALC, TRIG, CHOLHDL, LDLDIRECT in the last 72 hours. Thyroid function studies Recent Labs    09/11/20 0613  TSH 0.515   Anemia work up No results for input(s): VITAMINB12, FOLATE, FERRITIN, TIBC, IRON, RETICCTPCT in the last 72 hours. Urinalysis    Component Value Date/Time   COLORURINE AMBER (A) 09/09/2020 1640   APPEARANCEUR HAZY (A) 09/09/2020 1640   LABSPEC 1.023 09/09/2020 1640   PHURINE 5.0 09/09/2020 1640   GLUCOSEU NEGATIVE 09/09/2020 1640   HGBUR NEGATIVE 09/09/2020 1640   BILIRUBINUR NEGATIVE 09/09/2020 1640   KETONESUR NEGATIVE 09/09/2020 1640   PROTEINUR 100 (A) 09/09/2020 1640   UROBILINOGEN 1.0 08/06/2011 1157   NITRITE NEGATIVE 09/09/2020 1640   LEUKOCYTESUR NEGATIVE 09/09/2020 1640   Sepsis Labs Invalid input(s): PROCALCITONIN,  WBC,  LACTICIDVEN Microbiology Recent Results (from the past 240 hour(s))  Urine Culture     Status: Abnormal   Collection Time: 09/09/20 11:17 AM   Specimen: Urine, Clean Catch  Result Value Ref Range Status   Specimen Description URINE, CLEAN CATCH  Final   Special Requests   Final    NONE Performed at [facility], [address] [place], [place] [address]    Culture MULTIPLE SPECIES PRESENT, SUGGEST RECOLLECTION (A)  Final   Report Status 09/10/2020 FINAL  Final  Resp  Panel by RT-PCR (Flu A&B, Covid) Nasopharyngeal Swab     Status: None   Collection Time: 09/09/20 11:44 AM   Specimen: Nasopharyngeal Swab; Nasopharyngeal(NP) swabs in vial transport medium  Result Value Ref Range Status   SARS Coronavirus 2 by RT PCR NEGATIVE NEGATIVE Final    Comment: (NOTE) SARS-CoV-2 target nucleic acids are NOT DETECTED.  The SARS-CoV-2 RNA is generally detectable in upper respiratory specimens  during the acute phase of infection. The lowest concentration of SARS-CoV-2 viral copies this assay can detect is 138 copies/mL. A negative result does not preclude SARS-Cov-2 infection and should not be used as the sole basis for treatment or other patient management decisions. A negative result may occur with  improper specimen collection/handling, submission of specimen other than nasopharyngeal swab, presence of viral mutation(s) within the areas targeted by this assay, and inadequate number of viral copies(<138 copies/mL). A negative result must be combined with clinical observations, patient history, and epidemiological information. The expected result is Negative.  Fact Sheet for Patients:  BloggerCourse.com  Fact Sheet for Healthcare Providers:  SeriousBroker.it  This test is no t yet approved or cleared by the [place] FDA and  has been authorized for detection and/or diagnosis of SARS-CoV-2 by FDA under an Emergency Use Authorization (EUA). This EUA will remain  in effect (meaning this test can be used) for the duration of the COVID-19 declaration under Section 564(b)(1) of the Act, 21 U.S.C.section 360bbb-3(b)(1), unless the authorization is terminated  or revoked sooner.       Influenza A by PCR NEGATIVE NEGATIVE Final   Influenza B by PCR NEGATIVE NEGATIVE Final    Comment: (NOTE) The Xpert Xpress SARS-CoV-2/FLU/RSV plus assay is intended as an aid in the diagnosis of influenza from Nasopharyngeal  swab specimens and should not be used as a sole basis for treatment. Nasal washings and aspirates are unacceptable for Xpert Xpress SARS-CoV-2/FLU/RSV testing.  Fact Sheet for Patients: BloggerCourse.com  Fact Sheet for Healthcare Providers: SeriousBroker.it  This test is not yet approved or cleared by the [place] FDA and has been authorized for detection and/or diagnosis of SARS-CoV-2 by FDA under an Emergency Use Authorization (EUA). This EUA will remain in effect (meaning this test can be used) for the duration of the COVID-19 declaration under Section 564(b)(1) of the Act, 21 U.S.C. section 360bbb-3(b)(1), unless the authorization is terminated or revoked.  Performed at [facility], [address] [place], [place] [address]   Blood culture (routine x 2)     Status: None (Preliminary result)   Collection Time: 09/09/20 11:45 AM   Specimen: BLOOD  Result Value Ref Range Status   Specimen Description BLOOD RIGHT ANTECUBITAL  Final   Special Requests   Final    BOTTLES DRAWN AEROBIC AND ANAEROBIC Blood Culture results may not be optimal due to an inadequate volume of blood received in culture bottles   Culture   Final    NO GROWTH 4 DAYS Performed at [facility], [address], [place], [place] [address]    Report Status PENDING  Incomplete  Blood culture (routine x 2)     Status: None (Preliminary result)   Collection Time: 09/09/20 12:08 PM   Specimen: Site Not Specified; Blood  Result Value Ref Range Status   Specimen Description SITE NOT SPECIFIED  Final   Special Requests   Final    BOTTLES DRAWN AEROBIC AND ANAEROBIC Blood Culture adequate volume   Culture   Final    NO GROWTH 4 DAYS Performed at [facility], [address] [place], [place] [address]    Report Status PENDING  Incomplete  Body fluid culture w Gram Stain     Status: None   Collection Time: 09/09/20  5:07 PM   Specimen:  Body Fluid  Result Value Ref Range Status   Specimen Description FLUID  Final   Special Requests JOINT LEFT  SHOULDER  Final   Gram Stain   Final    ABUNDANT WBC PRESENT, PREDOMINANTLY PMN NO ORGANISMS SEEN    Culture   Final    NO GROWTH Performed at [facility], [address] [place], [place] [address]    Report Status 09/13/2020 FINAL  Final  MRSA Next Gen by PCR, Nasal     Status: Abnormal   Collection Time: 09/10/20  7:40 AM   Specimen: Nasal Mucosa; Nasal Swab  Result Value Ref Range Status   MRSA by PCR Next Gen DETECTED (A) NOT DETECTED Final    Comment: RESULT CALLED TO, READ BACK BY AND VERIFIED WITH: Festus AloeM. YARBORO RN, AT 1024 09/10/20 D. VANHOOK (NOTE) The GeneXpert MRSA Assay (FDA approved for NASAL specimens only), is one component of a comprehensive MRSA colonization surveillance program. It is not intended to diagnose MRSA infection nor to guide or monitor treatment for MRSA infections. Test performance is not FDA approved in patients less than 83 years old. Performed at [facility], [address] [place], [place] [address]      Time coordinating discharge: Over 30 minutes  SIGNED:   Huey Bienenstockawood Ambriella Kitt, MD  [facility] 09/13/2020, 1:34 PM Pager   If 7PM-7AM, please contact night-coverage [email or web address] Password [email or web address]

## 2020-09-14 LAB — CULTURE, BLOOD (ROUTINE X 2)
Culture: NO GROWTH
Culture: NO GROWTH
Special Requests: ADEQUATE

## 2020-09-27 DIAGNOSIS — Z09 Encounter for follow-up examination after completed treatment for conditions other than malignant neoplasm: Secondary | ICD-10-CM | POA: Diagnosis not present

## 2020-09-27 DIAGNOSIS — E1121 Type 2 diabetes mellitus with diabetic nephropathy: Secondary | ICD-10-CM | POA: Diagnosis not present

## 2020-09-27 DIAGNOSIS — Z7901 Long term (current) use of anticoagulants: Secondary | ICD-10-CM | POA: Diagnosis not present

## 2020-09-27 DIAGNOSIS — I129 Hypertensive chronic kidney disease with stage 1 through stage 4 chronic kidney disease, or unspecified chronic kidney disease: Secondary | ICD-10-CM | POA: Diagnosis not present

## 2020-09-27 DIAGNOSIS — G546 Phantom limb syndrome with pain: Secondary | ICD-10-CM | POA: Diagnosis not present

## 2020-09-27 DIAGNOSIS — M12819 Other specific arthropathies, not elsewhere classified, unspecified shoulder: Secondary | ICD-10-CM | POA: Diagnosis not present

## 2020-09-27 DIAGNOSIS — G4730 Sleep apnea, unspecified: Secondary | ICD-10-CM | POA: Diagnosis not present

## 2020-09-27 DIAGNOSIS — G929 Unspecified toxic encephalopathy: Secondary | ICD-10-CM | POA: Diagnosis not present

## 2020-09-27 DIAGNOSIS — A403 Sepsis due to Streptococcus pneumoniae: Secondary | ICD-10-CM | POA: Diagnosis not present

## 2020-10-01 DIAGNOSIS — L03114 Cellulitis of left upper limb: Secondary | ICD-10-CM | POA: Diagnosis not present

## 2020-10-02 ENCOUNTER — Emergency Department (HOSPITAL_COMMUNITY)
Admission: EM | Admit: 2020-10-02 | Discharge: 2020-10-02 | Disposition: A | Discharge status: Home / Self Care | Payer: Medicare Other | Attending: Emergency Medicine | Admitting: Emergency Medicine

## 2020-10-02 ENCOUNTER — Emergency Department (HOSPITAL_COMMUNITY): Payer: Medicare Other

## 2020-10-02 ENCOUNTER — Encounter (HOSPITAL_COMMUNITY): Payer: Self-pay | Admitting: *Deleted

## 2020-10-02 DIAGNOSIS — E876 Hypokalemia: Secondary | ICD-10-CM | POA: Diagnosis not present

## 2020-10-02 DIAGNOSIS — R079 Chest pain, unspecified: Secondary | ICD-10-CM | POA: Diagnosis not present

## 2020-10-02 DIAGNOSIS — Z79899 Other long term (current) drug therapy: Secondary | ICD-10-CM | POA: Diagnosis not present

## 2020-10-02 DIAGNOSIS — M79642 Pain in left hand: Secondary | ICD-10-CM | POA: Diagnosis not present

## 2020-10-02 DIAGNOSIS — E1140 Type 2 diabetes mellitus with diabetic neuropathy, unspecified: Secondary | ICD-10-CM | POA: Diagnosis not present

## 2020-10-02 DIAGNOSIS — M25511 Pain in right shoulder: Secondary | ICD-10-CM | POA: Insufficient documentation

## 2020-10-02 DIAGNOSIS — R6889 Other general symptoms and signs: Secondary | ICD-10-CM | POA: Diagnosis not present

## 2020-10-02 DIAGNOSIS — Z743 Need for continuous supervision: Secondary | ICD-10-CM | POA: Diagnosis not present

## 2020-10-02 DIAGNOSIS — I4891 Unspecified atrial fibrillation: Secondary | ICD-10-CM | POA: Diagnosis not present

## 2020-10-02 DIAGNOSIS — K439 Ventral hernia without obstruction or gangrene: Secondary | ICD-10-CM | POA: Diagnosis not present

## 2020-10-02 DIAGNOSIS — K429 Umbilical hernia without obstruction or gangrene: Secondary | ICD-10-CM | POA: Diagnosis not present

## 2020-10-02 DIAGNOSIS — I10 Essential (primary) hypertension: Secondary | ICD-10-CM | POA: Insufficient documentation

## 2020-10-02 DIAGNOSIS — Z87891 Personal history of nicotine dependence: Secondary | ICD-10-CM | POA: Insufficient documentation

## 2020-10-02 DIAGNOSIS — I4510 Unspecified right bundle-branch block: Secondary | ICD-10-CM | POA: Diagnosis not present

## 2020-10-02 DIAGNOSIS — M25532 Pain in left wrist: Secondary | ICD-10-CM | POA: Insufficient documentation

## 2020-10-02 DIAGNOSIS — R0902 Hypoxemia: Secondary | ICD-10-CM | POA: Diagnosis not present

## 2020-10-02 DIAGNOSIS — R531 Weakness: Secondary | ICD-10-CM | POA: Diagnosis not present

## 2020-10-02 DIAGNOSIS — Z7984 Long term (current) use of oral hypoglycemic drugs: Secondary | ICD-10-CM | POA: Insufficient documentation

## 2020-10-02 DIAGNOSIS — D72829 Elevated white blood cell count, unspecified: Secondary | ICD-10-CM | POA: Insufficient documentation

## 2020-10-02 DIAGNOSIS — Z8616 Personal history of COVID-19: Secondary | ICD-10-CM | POA: Diagnosis not present

## 2020-10-02 DIAGNOSIS — J449 Chronic obstructive pulmonary disease, unspecified: Secondary | ICD-10-CM | POA: Diagnosis not present

## 2020-10-02 DIAGNOSIS — Z7901 Long term (current) use of anticoagulants: Secondary | ICD-10-CM | POA: Insufficient documentation

## 2020-10-02 DIAGNOSIS — R52 Pain, unspecified: Secondary | ICD-10-CM

## 2020-10-02 DIAGNOSIS — R600 Localized edema: Secondary | ICD-10-CM | POA: Diagnosis not present

## 2020-10-02 DIAGNOSIS — R0789 Other chest pain: Secondary | ICD-10-CM | POA: Diagnosis not present

## 2020-10-02 LAB — CBC WITH DIFFERENTIAL/PLATELET
Abs Immature Granulocytes: 0.05 10*3/uL (ref 0.00–0.07)
Basophils Absolute: 0.1 10*3/uL (ref 0.0–0.1)
Basophils Relative: 1 %
Eosinophils Absolute: 0 10*3/uL (ref 0.0–0.5)
Eosinophils Relative: 0 %
HCT: 41.9 % (ref 39.0–52.0)
Hemoglobin: 13 g/dL (ref 13.0–17.0)
Immature Granulocytes: 0 %
Lymphocytes Relative: 6 %
Lymphs Abs: 0.7 10*3/uL (ref 0.7–4.0)
MCH: 29.2 pg (ref 26.0–34.0)
MCHC: 31 g/dL (ref 30.0–36.0)
MCV: 94.2 fL (ref 80.0–100.0)
Monocytes Absolute: 1.3 10*3/uL — ABNORMAL HIGH (ref 0.1–1.0)
Monocytes Relative: 11 %
Neutro Abs: 9.9 10*3/uL — ABNORMAL HIGH (ref 1.7–7.7)
Neutrophils Relative %: 82 %
Platelets: 321 10*3/uL (ref 150–400)
RBC: 4.45 MIL/uL (ref 4.22–5.81)
RDW: 14.3 % (ref 11.5–15.5)
WBC: 12.1 10*3/uL — ABNORMAL HIGH (ref 4.0–10.5)
nRBC: 0 % (ref 0.0–0.2)

## 2020-10-02 LAB — URINALYSIS, ROUTINE W REFLEX MICROSCOPIC
Bilirubin Urine: NEGATIVE
Glucose, UA: NEGATIVE mg/dL
Hgb urine dipstick: NEGATIVE
Ketones, ur: 5 mg/dL — AB
Leukocytes,Ua: NEGATIVE
Nitrite: NEGATIVE
Protein, ur: 100 mg/dL — AB
Specific Gravity, Urine: 1.03 (ref 1.005–1.030)
pH: 6 (ref 5.0–8.0)

## 2020-10-02 LAB — COMPREHENSIVE METABOLIC PANEL
ALT: 10 U/L (ref 0–44)
AST: 15 U/L (ref 15–41)
Albumin: 3.7 g/dL (ref 3.5–5.0)
Alkaline Phosphatase: 58 U/L (ref 38–126)
Anion gap: 13 (ref 5–15)
BUN: 12 mg/dL (ref 8–23)
CO2: 28 mmol/L (ref 22–32)
Calcium: 8.9 mg/dL (ref 8.9–10.3)
Chloride: 98 mmol/L (ref 98–111)
Creatinine, Ser: 0.57 mg/dL — ABNORMAL LOW (ref 0.61–1.24)
GFR, Estimated: >60 mL/min (ref >60)
Glucose, Bld: 141 mg/dL — ABNORMAL HIGH (ref 70–99)
Potassium: 3 mmol/L — ABNORMAL LOW (ref 3.5–5.1)
Sodium: 139 mmol/L (ref 135–145)
Total Bilirubin: 1.2 mg/dL (ref 0.3–1.2)
Total Protein: 8.5 g/dL — ABNORMAL HIGH (ref 6.5–8.1)

## 2020-10-02 LAB — MAGNESIUM: Magnesium: 2.2 mg/dL (ref 1.7–2.4)

## 2020-10-02 LAB — CBG MONITORING, ED: Glucose-Capillary: 134 mg/dL — ABNORMAL HIGH (ref 70–99)

## 2020-10-02 MED ORDER — METOPROLOL TARTRATE 25 MG PO TABS
25.0000 mg | ORAL_TABLET | Freq: Once | ORAL | Status: AC
  Administered 2020-10-02: 25 mg via ORAL
  Filled 2020-10-02: qty 1

## 2020-10-02 MED ORDER — DIAZEPAM 2 MG PO TABS
2.0000 mg | ORAL_TABLET | Freq: Once | ORAL | Status: AC
  Administered 2020-10-02: 2 mg via ORAL
  Filled 2020-10-02: qty 1

## 2020-10-02 MED ORDER — TRAMADOL HCL 50 MG PO TABS
50.0000 mg | ORAL_TABLET | Freq: Once | ORAL | Status: AC
Start: 2020-10-02 — End: 2020-10-02
  Administered 2020-10-02: 50 mg via ORAL
  Filled 2020-10-02: qty 1

## 2020-10-02 MED ORDER — POTASSIUM CHLORIDE ER 10 MEQ PO TBCR: 10.0000 meq | EXTENDED_RELEASE_TABLET | Freq: Two times a day (BID) | ORAL | 0 refills | Status: DC

## 2020-10-02 MED ORDER — METHYLPREDNISOLONE SODIUM SUCC 125 MG IJ SOLR
125.0000 mg | Freq: Once | INTRAMUSCULAR | Status: AC
  Administered 2020-10-02: 125 mg via INTRAVENOUS
  Filled 2020-10-02: qty 2

## 2020-10-02 MED ORDER — POTASSIUM CHLORIDE CRYS ER 20 MEQ PO TBCR
40.0000 meq | EXTENDED_RELEASE_TABLET | Freq: Once | ORAL | Status: AC
  Administered 2020-10-02: 40 meq via ORAL
  Filled 2020-10-02: qty 2

## 2020-10-02 NOTE — ED Notes (Signed)
Pt to xray at this time.

## 2020-10-02 NOTE — ED Provider Notes (Addendum)
[facility]-EMERGENCY DEPT Provider Note   CSN: [id number] Arrival date & time: 10/02/20  6812     History Chief Complaint  Patient presents with   Weakness   Wrist Pain    Mark Russell is a 85 y.o. male diabetes mellitus, hypertension, paroxysmal atrial fibrillation, DVT (on warfarin), COPD, PVD.  Presents to the emergency department with a chief complaint of generalized weakness left hand pain and right shoulder pain.  Reports that his generalized weakness right shoulder, and left hand pain started 2 days prior.  Patient reports that his shoulder and hand pain are intermittent.  Rates pain 6/10 on a pain scale.  Pain is worse with movement or touch.  Patient has tried "mineral ice," with no improvement in his symptoms.  Patient endorses associated swelling.  Patient denies any numbness or weakness.  Patient denies any known injuries or falls.  Patient is right-hand dominant.  Patient reports that his right shoulder pain has been intermittent since  Patient denies any known sick contacts.  Patient reports he has been taking all his medications as prescribed.  Patient reports he did not take his medications earlier today.  Patient reports that he lives at home with his son.   Weakness Associated symptoms: arthralgias   Associated symptoms: no abdominal pain, no chest pain, no cough, no dizziness, no dysuria, no fever, no frequency, no headaches, no nausea, no seizures, no shortness of breath and no vomiting   Wrist Pain Pertinent negatives include no chest pain, no abdominal pain, no headaches and no shortness of breath.      Past Medical History:  Diagnosis Date   Anxiety    Avascular necrosis (HCC)    BPH (benign prostatic hyperplasia)    Depression    Diabetes mellitus    DVT (deep venous thrombosis) (HCC)    GERD (gastroesophageal reflux disease)    History of leg amputation (HCC)    both legs   Hypertension    Neuropathy    Peripheral vascular  disease (HCC)    Sleep apnea     Patient Active Problem List   Diagnosis Date Noted   Hypokalemia 09/09/2020   Sepsis due to pneumonia (HCC) 09/09/2020   Left shoulder pain 09/09/2020   Cellulitis of multiple sites of scalp and neck 06/17/2020   Face lesion 06/15/2020   Physical deconditioning 06/15/2020   PAF (paroxysmal atrial fibrillation) (HCC) 06/15/2020   HLD (hyperlipidemia) 06/15/2020   HTN (hypertension) 06/15/2020   Lung nodule 06/15/2020   Chronic pain 06/15/2020   Supratherapeutic INR 06/14/2020   COVID-19 06/06/2020   Bronchitis 08/09/2015   OSA (obstructive sleep apnea) 11/17/2012   COPD (chronic obstructive pulmonary disease) (HCC) 11/10/2012   Dysphagia 03/29/2012    Class: Acute   Chest pain 03/29/2012   Diabetes type 2 with atherosclerosis of arteries of extremities (HCC) 03/29/2012   Acute respiratory failure (HCC) 08/09/2011   Hypoxemia 08/09/2011   Respiratory acidosis 08/09/2011   ARF (acute renal failure) (HCC) 08/07/2011   Hypoxia 08/06/2011   Leukocytosis 08/06/2011   Sepsis(995.91) 08/06/2011   PVD (peripheral vascular disease) (HCC) 08/06/2011   S/P AKA (above knee amputation) bilateral (HCC) 08/06/2011   DVT (deep venous thrombosis) (HCC) 08/06/2011    Past Surgical History:  Procedure Laterality Date   ABDOMINAL AORTIC ANEURYSM REPAIR  1988   BACK SURGERY  1988, 1989   BALLOON DILATION N/A 04/27/2012   Procedure: BALLOON DILATION;  Surgeon: Shirley Friar, MD;  Location: [facility] END[facility]COPY;  Service: Endoscopy;  Laterality: N/A;  no xray   BYPASS GRAFT     ESOPHAGOGASTRODUODENOSCOPY N/A 04/27/2012   Procedure: ESOPHAGOGASTRODUODENOSCOPY (EGD);  Surgeon: Shirley Friar, MD;  Location: [facility];  Service: Endoscopy;  Laterality: N/A;   LEG AMPUTATION THROUGH KNEE  06/2004       Family History  Problem Relation Age of Onset   Heart attack Other    Cancer Other    Cancer Other    Heart attack Other    Esophageal cancer Daughter         Living, 7    Social History   Tobacco Use   Smoking status: Former    Packs/day: 0.25    Years: 30.00    Pack years: 7.50    Types: Cigarettes    Quit date: 08/06/1991    Years since quitting: 29.1   Smokeless tobacco: Never  Substance Use Topics   Alcohol use: No    Home Medications Prior to Admission medications   Medication Sig Start Date End Date Taking? Authorizing Provider  acetaminophen (TYLENOL) 325 MG tablet Take 650 mg by mouth every 6 (six) hours as needed for mild pain, fever or headache.    [provider]  albuterol (VENTOLIN HFA) 108 (90 Base) MCG/ACT inhaler Inhale 2 puffs into the lungs every 6 (six) hours as needed for wheezing or shortness of breath. 06/10/20   Burnadette Pop, MD  amLODipine (NORVASC) 10 MG tablet Take 1 tablet (10 mg total) by mouth daily. 09/14/20   Elgergawy, Leana Roe, MD  Ascorbic Acid (VITAMIN C PO) Take 1 tablet by mouth daily.    [provider]  bismuth subsalicylate (PEPTO BISMOL) 262 MG/15ML suspension Take 30 mLs by mouth every 6 (six) hours as needed for diarrhea or loose stools.    [provider]  Cholecalciferol (VITAMIN D) 50 MCG (2000 UT) tablet Take 2,000 Units by mouth daily.    [provider]  colchicine 0.6 MG tablet Take 0.5 tablets (0.3 mg total) by mouth daily. 09/14/20   Elgergawy, Leana Roe, MD  donepezil (ARICEPT) 10 MG tablet Take 10 mg by mouth at bedtime. 04/08/20   [provider]  DULoxetine (CYMBALTA) 30 MG capsule Take 30 mg by mouth daily.    [provider]  fentaNYL (DURAGESIC) 25 MCG/HR Place 1 patch onto the skin every 3 (three) days.    [provider]  FLUoxetine (PROZAC) 20 MG capsule Take 40 mg by mouth daily.    [provider]  gabapentin (NEURONTIN) 300 MG capsule Take 300 mg by mouth 3 (three) times daily. 06/07/15   [provider]  glimepiride (AMARYL) 4 MG tablet Take 4 mg by mouth daily with breakfast.    [provider]  guaiFENesin-dextromethorphan (ROBITUSSIN DM) 100-10 MG/5ML syrup Take 10 mLs by mouth every 4 (four) hours as needed for cough. Patient not taking: Reported on 09/09/2020 06/10/20   Burnadette Pop, MD  hydrALAZINE (APRESOLINE) 25 MG tablet Take 1 tablet (25 mg total) by mouth 4 (four) times daily. 09/13/20   Elgergawy, Leana Roe, MD  metoprolol tartrate (LOPRESSOR) 25 MG tablet Take 1 tablet (25 mg total) by mouth 2 (two) times daily. 06/10/20 06/10/21  Burnadette Pop, MD  mineral oil-hydrophilic petrolatum (AQUAPHOR) ointment Apply 1 application topically daily as needed (bed sores).    [provider]  PERCOCET 7.5-325 MG tablet Take 1 tablet by mouth every 6 (six) hours as needed for pain. 05/22/20   [provider]  potassium chloride (  KLOR-CON) 10 MEQ tablet Take 1 tablet (10 mEq total) by mouth 2 (two) times daily for 14 days. 06/19/20 09/09/20  Glade Lloyd, MD  simvastatin (ZOCOR) 40 MG tablet Take 40 mg by mouth every evening.    [provider]  SYMBICORT 160-4.5 MCG/ACT inhaler Inhale 3 puffs into the lungs every 6 (six) hours as needed for wheezing or shortness of breath. 05/24/20   [provider]  tamsulosin (FLOMAX) 0.4 MG CAPS capsule Take 0.4 mg by mouth daily. 05/24/20   [provider]  vitamin B-12 (CYANOCOBALAMIN) 500 MCG tablet Take 1 tablet (500 mcg total) by mouth daily. 09/13/20   Elgergawy, Leana Roe, MD  warfarin (COUMADIN) 4 MG tablet Take 2 mg by mouth daily.    [provider]  zinc gluconate 50 MG tablet Take 50 mg by mouth daily.    [provider]    Allergies    Ace inhibitors, Dilaudid [hydromorphone hcl], Metformin and related, Morphine and related, and Pletal [cilostazol]  Review of Systems   Review of Systems  Constitutional:  Negative for chills and fever.  HENT:  Negative for congestion, rhinorrhea and sore throat.   Eyes:  Negative for visual disturbance.  Respiratory:  Negative for cough  and shortness of breath.   Cardiovascular:  Negative for chest pain and palpitations.  Gastrointestinal:  Negative for abdominal pain, nausea and vomiting.  Genitourinary:  Negative for difficulty urinating, dysuria, frequency and hematuria.  Musculoskeletal:  Positive for arthralgias and joint swelling. Negative for back pain and neck pain.  Skin:  Negative for color change and rash.  Neurological:  Positive for weakness (Generalized). Negative for dizziness, tremors, seizures, syncope, facial asymmetry, speech difficulty, light-headedness, numbness and headaches.  Psychiatric/Behavioral:  Negative for confusion.    Physical Exam Updated Vital Signs BP (!) 160/90 (BP Location: Right Arm)   Pulse 72   Temp 98.3 F (36.8 C) (Oral)   Resp 18   SpO2 95%   Physical Exam Vitals and nursing note reviewed.  Constitutional:      General: He is not in acute distress.    Appearance: He is not ill-appearing, toxic-appearing or diaphoretic.  HENT:     Head: Normocephalic.  Eyes:     General: No scleral icterus.       Right eye: No discharge.        Left eye: No discharge.  Cardiovascular:     Rate and Rhythm: Normal rate.     Pulses:          Radial pulses are 2+ on the right side and 2+ on the left side.  Pulmonary:     Effort: Pulmonary effort is normal. No tachypnea, bradypnea or respiratory distress.     Breath sounds: Normal breath sounds. No stridor.  Abdominal:     General: Abdomen is protuberant. There is no distension. There are no signs of injury.     Palpations: There is no mass or pulsatile mass.     Tenderness: There is no abdominal tenderness. There is no guarding or rebound.     Hernia: A hernia is present. Hernia is present in the umbilical area and ventral area.     Comments: Easily reducible ventral and umbilical hernias noted.  Musculoskeletal:     Right shoulder: No swelling, deformity, effusion, laceration, tenderness, bony tenderness or crepitus. Normal range of  motion.     Left shoulder: No swelling, deformity, effusion, laceration, tenderness, bony tenderness or crepitus. Normal range of motion.  Right upper arm: Normal.     Left upper arm: Normal.     Right elbow: Normal.     Left elbow: Normal.     Right forearm: Normal.     Left forearm: Normal.     Right wrist: Normal.     Left wrist: Swelling, tenderness and bony tenderness present. No deformity, effusion, lacerations, snuff box tenderness or crepitus. Decreased range of motion. Normal pulse.     Right hand: No swelling, deformity, lacerations, tenderness or bony tenderness. Normal range of motion. Normal strength. Normal sensation.     Left hand: Swelling, tenderness and bony tenderness present. No deformity or lacerations. Decreased range of motion. Normal sensation.     Comments: Swelling to dorsum of left hand and left wrist.  Diffuse tenderness to left hand and left wrist.     Right Lower Extremity: Right leg is amputated above knee.     Left Lower Extremity: Left leg is amputated above knee.  Skin:    General: Skin is warm and dry.  Neurological:     General: No focal deficit present.     Mental Status: He is alert.     GCS: GCS eye subscore is 4. GCS verbal subscore is 5. GCS motor subscore is 6.     Comments: CN II through XII intact.  Patient moves bilateral upper extremities equally  Psychiatric:        Behavior: Behavior is cooperative.    ED Results / Procedures / Treatments   Labs (all labs ordered are listed, but only abnormal results are displayed) Labs Reviewed  URINALYSIS, ROUTINE W REFLEX MICROSCOPIC - Abnormal; Notable for the following components:      Result Value   Ketones, ur 5 (*)    Protein, ur 100 (*)    Bacteria, UA RARE (*)    All other components within normal limits  COMPREHENSIVE METABOLIC PANEL - Abnormal; Notable for the following components:   Potassium 3.0 (*)    Glucose, Bld 141 (*)    Creatinine, Ser 0.57 (*)    Total Protein 8.5 (*)     All other components within normal limits  CBC WITH DIFFERENTIAL/PLATELET - Abnormal; Notable for the following components:   WBC 12.1 (*)    Neutro Abs 9.9 (*)    Monocytes Absolute 1.3 (*)    All other components within normal limits  CBG MONITORING, ED - Abnormal; Notable for the following components:   Glucose-Capillary 134 (*)    All other components within normal limits  MAGNESIUM    EKG None  Radiology DG Chest 1 View  Result Date: 10/02/2020 CLINICAL DATA:  Chest pain. EXAM: CHEST  1 VIEW COMPARISON:  09/10/2020 FINDINGS: The cardiac silhouette, mediastinal and hilar contours are within normal limits and stable. No acute pulmonary findings. No pleural effusion. IMPRESSION: No acute cardiopulmonary findings. Electronically Signed   By: Rudie Meyer M.D.   On: 10/02/2020 12:17   DG Shoulder Right  Result Date: 10/02/2020 CLINICAL DATA:  Right shoulder pain. EXAM: RIGHT SHOULDER - 2+ VIEW COMPARISON:  None. FINDINGS: Moderate glenohumeral and AC joint degenerative changes without findings for acute fracture or dislocation. There is moderate narrowing of the humeroacromial space which could suggest rotator cuff disease. The visualized right ribs are intact and the visualized right lung is grossly clear IMPRESSION: 1. Moderate degenerative changes but no acute bony findings. 2. Moderate narrowing of the humeroacromial space could suggest rotator cuff disease. Electronically Signed   By: Rudie Meyer  M.D.   On: 10/02/2020 12:15   DG Hand Complete Left  Result Date: 10/02/2020 CLINICAL DATA:  Left hand pain swelling for 3 days. EXAM: LEFT HAND - COMPLETE 3+ VIEW COMPARISON:  None. FINDINGS: Moderate age related degenerative changes involving the hand and wrist with osteoarthritic type changes. No acute bony abnormality or destructive bony changes. Moderate vascular calcifications. Diffuse soft tissue swelling is noted IMPRESSION: 1. Moderate age related degenerative changes involving the  hand and wrist. 2. No acute bony findings. Electronically Signed   By: Rudie Meyer M.D.   On: 10/02/2020 12:13    Procedures Procedures   Medications Ordered in ED Medications - No data to display  ED Course  I have reviewed the triage vital signs and the nursing notes.  Pertinent labs & imaging results that were available during my care of the patient were reviewed by me and considered in my medical decision making (see chart for details).  Clinical Course as of 10/02/20 1829  Wed Oct 02, 2020  1250 DG Hand Complete Left [EW]    Clinical Course User Index [EW] Mancel Bale, MD   MDM Rules/Calculators/A&P                           Alert 85 year old male no acute distress, nontoxic appearing.  Presents to ED with complaint of generalized weakness, right shoulder pain, and left hand pain/swelling.  Will obtain CBC, CMP, magnesium, urinalysis, chest x-ray, DG right shoulder, DG left hand.  CBC shows slight leukocytosis at 12.1 CMP shows hypokalemia at 3.0, magnesium within normal limits. Urinalysis shows bacteria rare, WBC 0-5, leukocytes negative, nitrite negative; low suspicion for UTI at this time.  Chest x-ray is unremarkable Right shoulder and left hand x-ray showed no acute abnormality  Left hand has no erythema, fluctuance, or drainage; low suspicion for cellulitis at this time.  Daughter at bedside reports that patient was recently started on antibiotics for possible cellulitis infection to left hand.  We will have patient continue prescribed antibiotics.  Patient given potassium for hypokalemia in emergency department.  Previously was prescribed potassium, will give patient new prescription for potassium.  Patient to follow-up closely with primary care provider  Discussed results, findings, treatment and follow up. Patient advised of return precautions. Patient verbalized understanding and agreed with plan.  Patient was discussed with and evaluated by Dr.  Effie Shy.   Final Clinical Impression(s) / ED Diagnoses Final diagnoses:  Left wrist pain  Acute pain of right shoulder  Generalized weakness    Rx / DC Orders ED Discharge Orders          Ordered    Home Health        10/02/20 1328    Face-to-face encounter (required for Medicare/Medicaid patients)       Comments: I Haskel Schroeder certify that this patient is under my care and that I, or a nurse practitioner or physician's assistant working with me, had a face-to-face encounter that meets the physician face-to-face encounter requirements with this patient on 10/02/2020. The encounter with the patient was in whole, or in part for the following medical condition(s) which is the primary reason for home health care (List medical condition): Bilateral above-knee amputation   10/02/20 1328    potassium chloride (KLOR-CON) 10 MEQ tablet  2 times daily        10/02/20 1334             Riyad Keena, [place], [place]  10/02/20 1822    Haskel Schroeder, PA-C 10/02/20 Izola Price    Mancel Bale, MD 10/02/20 [phone]

## 2020-10-02 NOTE — ED Provider Notes (Signed)
  Face-to-face evaluation   History: He presents for evaluation of pain in the left hand with swelling for 5 days, followed by pain in right shoulder for 3 days.  No known trauma.  Yesterday he saw his PCP who started him on an unknown antibiotic for infection of his left hand.  He has not had these problems previously.  He is with his daughter who helps to give history.  The patient and his daughter are both unaware of his prior medical history.  He has multiple medical problems including peripheral vascular disease, diabetes, respiratory failure, COPD, obstructive sleep apnea and paroxysmal atrial fibrillation.  Physical exam: Elderly, obese male.  He is alert and interactive.  Left hand is swollen and mildly tender dorsally.  There is no significant associated erythema, fluctuance or drainage.  There is no proximal streaking of the left forearm.  He is able to move the left hand.  Palm of left hand is slightly pale as compared to the palm of the right hand.  The right shoulder is mildly tender.  The right trapezius is exquisitely tender.  He has limited range of motion of the right shoulder secondary to pain.  Medical screening examination/treatment/procedure(s) were conducted as a shared visit with non-physician practitioner(s) and myself.  I personally evaluated the patient during the encounter    Mancel Bale, MD 10/02/20 1827

## 2020-10-02 NOTE — ED Triage Notes (Addendum)
Per EMS, pt complains of generalized weakness yesterday. He is double amputee, reports left wrist, left stump, and groin pain. He went to PCP yesterday, does not remember what he said. Hx diabetes.  BP 188/86 HR 82 RR 20 O2 94% on RA CBG 135

## 2020-10-02 NOTE — Discharge Instructions (Addendum)
You came to the emergency department today to be evaluated for your generalized weakness and joint pain.  Your x-ray images showed no acute broken bones or dislocations.  Your lab work showed that your potassium was low.  Please continue take your prescribed potassium and follow-up with your primary care provider for repeat evaluation of this lab work.  The remainder of your lab work was reassuring.  While in the emergency department you did receive your blood pressure medication.  Please continue to take all medications as prescribed.  Please continue to take your prescribed antibiotics.  Get help right away if: You cannot move the joint. Your fingers or toes tingle, become numb, or turn cold and blue. You have a fever along with a joint that is red, warm, and swollen. Develop sudden weakness, especially on one side of your face or body. Have chest pain. Have trouble breathing or shortness of breath. Have problems with your vision. Have trouble talking or swallowing. Have trouble standing or walking. Are light-headed or lose consciousness.

## 2020-10-02 NOTE — ED Notes (Signed)
Xray at the bedside.

## 2020-10-02 NOTE — ED Notes (Addendum)
Pt was transferred via sabina lift to bed x3

## 2020-10-11 DIAGNOSIS — G894 Chronic pain syndrome: Secondary | ICD-10-CM | POA: Diagnosis not present

## 2020-10-11 DIAGNOSIS — Z7901 Long term (current) use of anticoagulants: Secondary | ICD-10-CM | POA: Diagnosis not present

## 2020-10-11 DIAGNOSIS — G546 Phantom limb syndrome with pain: Secondary | ICD-10-CM | POA: Diagnosis not present

## 2020-10-11 DIAGNOSIS — G4700 Insomnia, unspecified: Secondary | ICD-10-CM | POA: Diagnosis not present

## 2020-11-07 DIAGNOSIS — G894 Chronic pain syndrome: Secondary | ICD-10-CM | POA: Diagnosis not present

## 2020-11-07 DIAGNOSIS — G546 Phantom limb syndrome with pain: Secondary | ICD-10-CM | POA: Diagnosis not present

## 2020-11-07 DIAGNOSIS — G4700 Insomnia, unspecified: Secondary | ICD-10-CM | POA: Diagnosis not present

## 2020-11-07 DIAGNOSIS — Z79891 Long term (current) use of opiate analgesic: Secondary | ICD-10-CM | POA: Diagnosis not present

## 2020-11-15 DIAGNOSIS — Z7901 Long term (current) use of anticoagulants: Secondary | ICD-10-CM | POA: Diagnosis not present

## 2020-11-22 DIAGNOSIS — E1152 Type 2 diabetes mellitus with diabetic peripheral angiopathy with gangrene: Secondary | ICD-10-CM | POA: Diagnosis not present

## 2020-11-22 DIAGNOSIS — G546 Phantom limb syndrome with pain: Secondary | ICD-10-CM | POA: Diagnosis not present

## 2020-11-22 DIAGNOSIS — J449 Chronic obstructive pulmonary disease, unspecified: Secondary | ICD-10-CM | POA: Diagnosis not present

## 2020-11-22 DIAGNOSIS — S88911A Complete traumatic amputation of right lower leg, level unspecified, initial encounter: Secondary | ICD-10-CM | POA: Diagnosis not present

## 2020-11-22 DIAGNOSIS — N181 Chronic kidney disease, stage 1: Secondary | ICD-10-CM | POA: Diagnosis not present

## 2020-11-22 DIAGNOSIS — Z899 Acquired absence of limb, unspecified: Secondary | ICD-10-CM | POA: Diagnosis not present

## 2020-11-22 DIAGNOSIS — Z7901 Long term (current) use of anticoagulants: Secondary | ICD-10-CM | POA: Diagnosis not present

## 2020-11-22 DIAGNOSIS — I129 Hypertensive chronic kidney disease with stage 1 through stage 4 chronic kidney disease, or unspecified chronic kidney disease: Secondary | ICD-10-CM | POA: Diagnosis not present

## 2020-11-22 DIAGNOSIS — S88912A Complete traumatic amputation of left lower leg, level unspecified, initial encounter: Secondary | ICD-10-CM | POA: Diagnosis not present

## 2020-11-22 DIAGNOSIS — M12819 Other specific arthropathies, not elsewhere classified, unspecified shoulder: Secondary | ICD-10-CM | POA: Diagnosis not present

## 2020-12-06 DIAGNOSIS — G894 Chronic pain syndrome: Secondary | ICD-10-CM | POA: Diagnosis not present

## 2020-12-06 DIAGNOSIS — E876 Hypokalemia: Secondary | ICD-10-CM | POA: Diagnosis not present

## 2020-12-06 DIAGNOSIS — G4700 Insomnia, unspecified: Secondary | ICD-10-CM | POA: Diagnosis not present

## 2020-12-06 DIAGNOSIS — Z7901 Long term (current) use of anticoagulants: Secondary | ICD-10-CM | POA: Diagnosis not present

## 2020-12-06 DIAGNOSIS — G546 Phantom limb syndrome with pain: Secondary | ICD-10-CM | POA: Diagnosis not present

## 2021-01-01 DIAGNOSIS — G546 Phantom limb syndrome with pain: Secondary | ICD-10-CM | POA: Diagnosis not present

## 2021-01-01 DIAGNOSIS — Z7901 Long term (current) use of anticoagulants: Secondary | ICD-10-CM | POA: Diagnosis not present

## 2021-01-01 DIAGNOSIS — G4700 Insomnia, unspecified: Secondary | ICD-10-CM | POA: Diagnosis not present

## 2021-01-01 DIAGNOSIS — G894 Chronic pain syndrome: Secondary | ICD-10-CM | POA: Diagnosis not present

## 2021-01-02 DIAGNOSIS — Z89619 Acquired absence of unspecified leg above knee: Secondary | ICD-10-CM | POA: Diagnosis not present

## 2021-01-02 DIAGNOSIS — I739 Peripheral vascular disease, unspecified: Secondary | ICD-10-CM | POA: Diagnosis not present

## 2021-01-02 DIAGNOSIS — Z89611 Acquired absence of right leg above knee: Secondary | ICD-10-CM | POA: Diagnosis not present

## 2021-01-02 DIAGNOSIS — Z89512 Acquired absence of left leg below knee: Secondary | ICD-10-CM | POA: Diagnosis not present

## 2021-01-17 DIAGNOSIS — Z7901 Long term (current) use of anticoagulants: Secondary | ICD-10-CM | POA: Diagnosis not present

## 2021-01-31 DIAGNOSIS — G894 Chronic pain syndrome: Secondary | ICD-10-CM | POA: Diagnosis not present

## 2021-01-31 DIAGNOSIS — G546 Phantom limb syndrome with pain: Secondary | ICD-10-CM | POA: Diagnosis not present

## 2021-01-31 DIAGNOSIS — M7552 Bursitis of left shoulder: Secondary | ICD-10-CM | POA: Diagnosis not present

## 2021-01-31 DIAGNOSIS — G4700 Insomnia, unspecified: Secondary | ICD-10-CM | POA: Diagnosis not present

## 2021-01-31 DIAGNOSIS — Z7901 Long term (current) use of anticoagulants: Secondary | ICD-10-CM | POA: Diagnosis not present

## 2021-01-31 DIAGNOSIS — M7551 Bursitis of right shoulder: Secondary | ICD-10-CM | POA: Diagnosis not present

## 2021-02-17 ENCOUNTER — Telehealth: Payer: Self-pay

## 2021-02-17 NOTE — Telephone Encounter (Signed)
Spoke with patient's daughter Mark Russell to schedule a Palliative Care follow up. She declined further services at this time. Will discharge.

## 2021-02-28 DIAGNOSIS — G4700 Insomnia, unspecified: Secondary | ICD-10-CM | POA: Diagnosis not present

## 2021-02-28 DIAGNOSIS — G546 Phantom limb syndrome with pain: Secondary | ICD-10-CM | POA: Diagnosis not present

## 2021-02-28 DIAGNOSIS — Z7901 Long term (current) use of anticoagulants: Secondary | ICD-10-CM | POA: Diagnosis not present

## 2021-02-28 DIAGNOSIS — G894 Chronic pain syndrome: Secondary | ICD-10-CM | POA: Diagnosis not present

## 2021-04-04 DIAGNOSIS — Z7901 Long term (current) use of anticoagulants: Secondary | ICD-10-CM | POA: Diagnosis not present

## 2021-04-04 DIAGNOSIS — G4700 Insomnia, unspecified: Secondary | ICD-10-CM | POA: Diagnosis not present

## 2021-04-04 DIAGNOSIS — G894 Chronic pain syndrome: Secondary | ICD-10-CM | POA: Diagnosis not present

## 2021-04-04 DIAGNOSIS — Z79891 Long term (current) use of opiate analgesic: Secondary | ICD-10-CM | POA: Diagnosis not present

## 2021-04-04 DIAGNOSIS — G546 Phantom limb syndrome with pain: Secondary | ICD-10-CM | POA: Diagnosis not present

## 2021-05-02 DIAGNOSIS — I480 Paroxysmal atrial fibrillation: Secondary | ICD-10-CM | POA: Diagnosis not present

## 2021-05-08 DIAGNOSIS — G546 Phantom limb syndrome with pain: Secondary | ICD-10-CM | POA: Diagnosis not present

## 2021-05-08 DIAGNOSIS — G4700 Insomnia, unspecified: Secondary | ICD-10-CM | POA: Diagnosis not present

## 2021-05-08 DIAGNOSIS — G894 Chronic pain syndrome: Secondary | ICD-10-CM | POA: Diagnosis not present

## 2021-05-28 DIAGNOSIS — F02818 Dementia in other diseases classified elsewhere, unspecified severity, with other behavioral disturbance: Secondary | ICD-10-CM | POA: Diagnosis not present

## 2021-05-28 DIAGNOSIS — Z899 Acquired absence of limb, unspecified: Secondary | ICD-10-CM | POA: Diagnosis not present

## 2021-05-28 DIAGNOSIS — Z0000 Encounter for general adult medical examination without abnormal findings: Secondary | ICD-10-CM | POA: Diagnosis not present

## 2021-05-28 DIAGNOSIS — I129 Hypertensive chronic kidney disease with stage 1 through stage 4 chronic kidney disease, or unspecified chronic kidney disease: Secondary | ICD-10-CM | POA: Diagnosis not present

## 2021-05-28 DIAGNOSIS — Z7901 Long term (current) use of anticoagulants: Secondary | ICD-10-CM | POA: Diagnosis not present

## 2021-05-28 DIAGNOSIS — E1151 Type 2 diabetes mellitus with diabetic peripheral angiopathy without gangrene: Secondary | ICD-10-CM | POA: Diagnosis not present

## 2021-05-28 DIAGNOSIS — I739 Peripheral vascular disease, unspecified: Secondary | ICD-10-CM | POA: Diagnosis not present

## 2021-05-28 DIAGNOSIS — I480 Paroxysmal atrial fibrillation: Secondary | ICD-10-CM | POA: Diagnosis not present

## 2021-05-28 DIAGNOSIS — E1152 Type 2 diabetes mellitus with diabetic peripheral angiopathy with gangrene: Secondary | ICD-10-CM | POA: Diagnosis not present

## 2021-06-06 DIAGNOSIS — G4700 Insomnia, unspecified: Secondary | ICD-10-CM | POA: Diagnosis not present

## 2021-06-06 DIAGNOSIS — G546 Phantom limb syndrome with pain: Secondary | ICD-10-CM | POA: Diagnosis not present

## 2021-06-06 DIAGNOSIS — G894 Chronic pain syndrome: Secondary | ICD-10-CM | POA: Diagnosis not present

## 2021-06-27 DIAGNOSIS — Z7901 Long term (current) use of anticoagulants: Secondary | ICD-10-CM | POA: Diagnosis not present

## 2021-07-04 DIAGNOSIS — G894 Chronic pain syndrome: Secondary | ICD-10-CM | POA: Diagnosis not present

## 2021-07-04 DIAGNOSIS — G546 Phantom limb syndrome with pain: Secondary | ICD-10-CM | POA: Diagnosis not present

## 2021-07-04 DIAGNOSIS — Z79891 Long term (current) use of opiate analgesic: Secondary | ICD-10-CM | POA: Diagnosis not present

## 2021-07-25 DIAGNOSIS — Z7901 Long term (current) use of anticoagulants: Secondary | ICD-10-CM | POA: Diagnosis not present

## 2021-08-01 ENCOUNTER — Telehealth: Payer: Self-pay

## 2021-08-01 DIAGNOSIS — G546 Phantom limb syndrome with pain: Secondary | ICD-10-CM | POA: Diagnosis not present

## 2021-08-01 DIAGNOSIS — G894 Chronic pain syndrome: Secondary | ICD-10-CM | POA: Diagnosis not present

## 2021-08-01 DIAGNOSIS — G4700 Insomnia, unspecified: Secondary | ICD-10-CM | POA: Diagnosis not present

## 2021-08-01 NOTE — Patient Outreach (Signed)
  Care Management   Outreach Note  08/01/2021 Name: Mark Russell MRN: [id number] DOB: Apr 02, 1935  An unsuccessful telephone outreach was attempted today. The patient was referred to the case management team for assistance with care management and care coordination.    Follow Up Plan:  Unable to leave a voice message due to voice mailbox not being set up. A member of the care team will attempt to reach within the next two weeks.  [facility] Care Management [phone]

## 2021-08-07 DIAGNOSIS — G546 Phantom limb syndrome with pain: Secondary | ICD-10-CM | POA: Diagnosis not present

## 2021-08-07 DIAGNOSIS — K222 Esophageal obstruction: Secondary | ICD-10-CM | POA: Diagnosis not present

## 2021-08-07 DIAGNOSIS — L989 Disorder of the skin and subcutaneous tissue, unspecified: Secondary | ICD-10-CM | POA: Diagnosis not present

## 2021-08-18 ENCOUNTER — Ambulatory Visit: Payer: Medicare Other

## 2021-08-18 NOTE — Patient Outreach (Signed)
  Care Coordination   Initial Visit Note   08/18/2021 Name: Chaston Bradburn MRN: [id number] DOB: Jun 10, 1935  Kreston Ahrendt is a 86 y.o. year old male who sees Irven Coe, MD for primary care. I spoke with  Dema Severin by phone today  What matters to the patients health and wellness today?  Health Maintenance   Goals Addressed             This Visit's Progress    Health Maintenance       Care Coordination Interventions: Reviewed plan for disease management. Reports adhering to plan. Attending medical appointments as scheduled.  Reports taking medications as prescribed. Denies concerns r/t medication management or prescription cost. AWV up to date. Completed on 05/28/21.          SDOH assessments and interventions completed:  Yes  SDOH Interventions Today    Flowsheet Row Most Recent Value  SDOH Interventions   Food Insecurity Interventions Intervention Not Indicated  Transportation Interventions Intervention Not Indicated        Care Coordination Interventions Activated:  Yes  Care Coordination Interventions:  Yes, provided   Follow up plan:  Patient agreed to call for care coordination assistance as needed.    Encounter Outcome:  Pt. Visit Completed    [facility] Care Management [phone]

## 2021-08-18 NOTE — Patient Instructions (Addendum)
Visit Information Thank you for allowing the Care Management team to participate in your care. It was great speaking with you  Following are the goals we discussed today:   Goals Addressed             This Visit's Progress    Health Maintenance       Care Coordination Interventions: Reviewed plan for disease management. Reports adhering to plan. Attending medical appointments as scheduled.  Reports taking medications as prescribed. Denies concerns r/t medication management or prescription cost. AWV up to date. Completed on 05/28/21.         Mark Russell verbalized understanding of information discussed during the telephonic outreach.  Declined need for mailed instructions or resources.   Mark Russell will call for care coordination assistance as needed.   [facility] Care Management [phone]

## 2021-08-29 DIAGNOSIS — G546 Phantom limb syndrome with pain: Secondary | ICD-10-CM | POA: Diagnosis not present

## 2021-08-29 DIAGNOSIS — R0989 Other specified symptoms and signs involving the circulatory and respiratory systems: Secondary | ICD-10-CM | POA: Diagnosis not present

## 2021-08-29 DIAGNOSIS — R059 Cough, unspecified: Secondary | ICD-10-CM | POA: Diagnosis not present

## 2021-08-29 DIAGNOSIS — J441 Chronic obstructive pulmonary disease with (acute) exacerbation: Secondary | ICD-10-CM | POA: Diagnosis not present

## 2021-08-29 DIAGNOSIS — G4700 Insomnia, unspecified: Secondary | ICD-10-CM | POA: Diagnosis not present

## 2021-08-29 DIAGNOSIS — Z7901 Long term (current) use of anticoagulants: Secondary | ICD-10-CM | POA: Diagnosis not present

## 2021-08-29 DIAGNOSIS — G894 Chronic pain syndrome: Secondary | ICD-10-CM | POA: Diagnosis not present

## 2021-09-24 DIAGNOSIS — I739 Peripheral vascular disease, unspecified: Secondary | ICD-10-CM | POA: Diagnosis not present

## 2021-09-24 DIAGNOSIS — Z89611 Acquired absence of right leg above knee: Secondary | ICD-10-CM | POA: Diagnosis not present

## 2021-09-24 DIAGNOSIS — Z89512 Acquired absence of left leg below knee: Secondary | ICD-10-CM | POA: Diagnosis not present

## 2021-09-24 DIAGNOSIS — Z89619 Acquired absence of unspecified leg above knee: Secondary | ICD-10-CM | POA: Diagnosis not present

## 2021-09-26 DIAGNOSIS — Z7901 Long term (current) use of anticoagulants: Secondary | ICD-10-CM | POA: Diagnosis not present

## 2021-10-24 DIAGNOSIS — G894 Chronic pain syndrome: Secondary | ICD-10-CM | POA: Diagnosis not present

## 2021-10-24 DIAGNOSIS — G546 Phantom limb syndrome with pain: Secondary | ICD-10-CM | POA: Diagnosis not present

## 2021-10-24 DIAGNOSIS — G4700 Insomnia, unspecified: Secondary | ICD-10-CM | POA: Diagnosis not present

## 2021-10-24 DIAGNOSIS — Z7901 Long term (current) use of anticoagulants: Secondary | ICD-10-CM | POA: Diagnosis not present

## 2021-11-07 DIAGNOSIS — S60511A Abrasion of right hand, initial encounter: Secondary | ICD-10-CM | POA: Diagnosis not present

## 2021-11-07 DIAGNOSIS — L0889 Other specified local infections of the skin and subcutaneous tissue: Secondary | ICD-10-CM | POA: Diagnosis not present

## 2021-11-21 DIAGNOSIS — Z7901 Long term (current) use of anticoagulants: Secondary | ICD-10-CM | POA: Diagnosis not present

## 2021-11-21 DIAGNOSIS — G894 Chronic pain syndrome: Secondary | ICD-10-CM | POA: Diagnosis not present

## 2021-11-21 DIAGNOSIS — G546 Phantom limb syndrome with pain: Secondary | ICD-10-CM | POA: Diagnosis not present

## 2021-11-21 DIAGNOSIS — G4700 Insomnia, unspecified: Secondary | ICD-10-CM | POA: Diagnosis not present

## 2021-11-21 DIAGNOSIS — Z79891 Long term (current) use of opiate analgesic: Secondary | ICD-10-CM | POA: Diagnosis not present

## 2021-12-05 DIAGNOSIS — J441 Chronic obstructive pulmonary disease with (acute) exacerbation: Secondary | ICD-10-CM | POA: Diagnosis not present

## 2021-12-05 DIAGNOSIS — I129 Hypertensive chronic kidney disease with stage 1 through stage 4 chronic kidney disease, or unspecified chronic kidney disease: Secondary | ICD-10-CM | POA: Diagnosis not present

## 2021-12-05 DIAGNOSIS — F02818 Dementia in other diseases classified elsewhere, unspecified severity, with other behavioral disturbance: Secondary | ICD-10-CM | POA: Diagnosis not present

## 2021-12-05 DIAGNOSIS — I739 Peripheral vascular disease, unspecified: Secondary | ICD-10-CM | POA: Diagnosis not present

## 2021-12-05 DIAGNOSIS — F02811 Dementia in other diseases classified elsewhere, unspecified severity, with agitation: Secondary | ICD-10-CM | POA: Diagnosis not present

## 2021-12-05 DIAGNOSIS — E1152 Type 2 diabetes mellitus with diabetic peripheral angiopathy with gangrene: Secondary | ICD-10-CM | POA: Diagnosis not present

## 2021-12-22 DIAGNOSIS — G546 Phantom limb syndrome with pain: Secondary | ICD-10-CM | POA: Diagnosis not present

## 2021-12-22 DIAGNOSIS — G4700 Insomnia, unspecified: Secondary | ICD-10-CM | POA: Diagnosis not present

## 2021-12-22 DIAGNOSIS — Z7901 Long term (current) use of anticoagulants: Secondary | ICD-10-CM | POA: Diagnosis not present

## 2021-12-22 DIAGNOSIS — G894 Chronic pain syndrome: Secondary | ICD-10-CM | POA: Diagnosis not present

## 2022-01-11 ENCOUNTER — Other Ambulatory Visit: Payer: Self-pay

## 2022-01-11 ENCOUNTER — Emergency Department (HOSPITAL_BASED_OUTPATIENT_CLINIC_OR_DEPARTMENT_OTHER)
Admission: EM | Admit: 2022-01-11 | Discharge: 2022-01-11 | Disposition: A | Discharge status: Home / Self Care | Payer: Medicare PPO | Attending: Emergency Medicine | Admitting: Emergency Medicine

## 2022-01-11 ENCOUNTER — Emergency Department (HOSPITAL_BASED_OUTPATIENT_CLINIC_OR_DEPARTMENT_OTHER): Payer: Medicare PPO

## 2022-01-11 ENCOUNTER — Encounter (HOSPITAL_BASED_OUTPATIENT_CLINIC_OR_DEPARTMENT_OTHER): Payer: Self-pay | Admitting: Emergency Medicine

## 2022-01-11 ENCOUNTER — Emergency Department (HOSPITAL_BASED_OUTPATIENT_CLINIC_OR_DEPARTMENT_OTHER): Payer: Medicare PPO | Admitting: Radiology

## 2022-01-11 DIAGNOSIS — J449 Chronic obstructive pulmonary disease, unspecified: Secondary | ICD-10-CM | POA: Diagnosis not present

## 2022-01-11 DIAGNOSIS — Z79899 Other long term (current) drug therapy: Secondary | ICD-10-CM | POA: Insufficient documentation

## 2022-01-11 DIAGNOSIS — M25512 Pain in left shoulder: Secondary | ICD-10-CM | POA: Diagnosis present

## 2022-01-11 DIAGNOSIS — Z7984 Long term (current) use of oral hypoglycemic drugs: Secondary | ICD-10-CM | POA: Diagnosis not present

## 2022-01-11 DIAGNOSIS — R918 Other nonspecific abnormal finding of lung field: Secondary | ICD-10-CM | POA: Diagnosis not present

## 2022-01-11 DIAGNOSIS — I10 Essential (primary) hypertension: Secondary | ICD-10-CM | POA: Insufficient documentation

## 2022-01-11 DIAGNOSIS — R0602 Shortness of breath: Secondary | ICD-10-CM | POA: Diagnosis not present

## 2022-01-11 DIAGNOSIS — X58XXXA Exposure to other specified factors, initial encounter: Secondary | ICD-10-CM | POA: Insufficient documentation

## 2022-01-11 DIAGNOSIS — T148XXA Other injury of unspecified body region, initial encounter: Secondary | ICD-10-CM

## 2022-01-11 DIAGNOSIS — J811 Chronic pulmonary edema: Secondary | ICD-10-CM | POA: Diagnosis not present

## 2022-01-11 DIAGNOSIS — E1140 Type 2 diabetes mellitus with diabetic neuropathy, unspecified: Secondary | ICD-10-CM | POA: Diagnosis not present

## 2022-01-11 DIAGNOSIS — S42018A Nondisplaced fracture of sternal end of left clavicle, initial encounter for closed fracture: Secondary | ICD-10-CM | POA: Insufficient documentation

## 2022-01-11 DIAGNOSIS — S42025A Nondisplaced fracture of shaft of left clavicle, initial encounter for closed fracture: Secondary | ICD-10-CM | POA: Diagnosis not present

## 2022-01-11 DIAGNOSIS — S42002A Fracture of unspecified part of left clavicle, initial encounter for closed fracture: Secondary | ICD-10-CM | POA: Diagnosis not present

## 2022-01-11 HISTORY — DX: Chronic obstructive pulmonary disease, unspecified: J44.9

## 2022-01-11 LAB — BASIC METABOLIC PANEL
Anion gap: 12 (ref 5–15)
BUN: 10 mg/dL (ref 8–23)
CO2: 32 mmol/L (ref 22–32)
Calcium: 8.7 mg/dL — ABNORMAL LOW (ref 8.9–10.3)
Chloride: 95 mmol/L — ABNORMAL LOW (ref 98–111)
Creatinine, Ser: 0.6 mg/dL — ABNORMAL LOW (ref 0.61–1.24)
GFR, Estimated: >60 mL/min (ref >60)
Glucose, Bld: 110 mg/dL — ABNORMAL HIGH (ref 70–99)
Potassium: 2.8 mmol/L — ABNORMAL LOW (ref 3.5–5.1)
Sodium: 139 mmol/L (ref 135–145)

## 2022-01-11 LAB — PROTIME-INR
INR: 2.7 — ABNORMAL HIGH (ref 0.8–1.2)
Prothrombin Time: 28.3 seconds — ABNORMAL HIGH (ref 11.4–15.2)

## 2022-01-11 LAB — CBC
HCT: 41.1 % (ref 39.0–52.0)
Hemoglobin: 13.3 g/dL (ref 13.0–17.0)
MCH: 31.1 pg (ref 26.0–34.0)
MCHC: 32.4 g/dL (ref 30.0–36.0)
MCV: 96.3 fL (ref 80.0–100.0)
Platelets: 335 10*3/uL (ref 150–400)
RBC: 4.27 MIL/uL (ref 4.22–5.81)
RDW: 15.8 % — ABNORMAL HIGH (ref 11.5–15.5)
WBC: 8.8 10*3/uL (ref 4.0–10.5)
nRBC: 0 % (ref 0.0–0.2)

## 2022-01-11 LAB — CK: Total CK: 157 U/L (ref 49–397)

## 2022-01-11 MED ORDER — OXYCODONE-ACETAMINOPHEN 5-325 MG PO TABS: 1.0000 | ORAL_TABLET | Freq: Four times a day (QID) | ORAL | 0 refills | Status: AC | PRN

## 2022-01-11 MED ORDER — OXYCODONE-ACETAMINOPHEN 5-325 MG PO TABS
1.0000 | ORAL_TABLET | Freq: Once | ORAL | Status: AC
  Administered 2022-01-11: 1 via ORAL
  Filled 2022-01-11: qty 1

## 2022-01-11 NOTE — Discharge Instructions (Addendum)
Your XR and CT scan showed a fracture of the left clavicle. Please call Dr. Raliegh Ip from orthopedics tomorrow to make a follow up appointment and hold off the coumadin for 1-2 days until you see him.  Your potassium level is also low today so please follow-up with primary care physician Dr. Esmeralda Links for further evaluation and management.  Return to the ER if new or worsening symptoms.

## 2022-01-11 NOTE — ED Provider Notes (Signed)
  Physical Exam  BP (!) 170/67 (BP Location: Right Arm)   Pulse 77   Temp 98.1 F (36.7 C) (Oral)   Resp 16   SpO2 99%   Physical Exam  Procedures  Procedures  ED Course / MDM    Medical Decision Making Amount and/or Complexity of Data Reviewed Labs: ordered. Radiology: ordered.  Risk Prescription drug management.   87 year old male history of peripheral vascular disease, on chronic anticoagulation, status post bilateral lower AKA's, wheelchair-bound.  Patient moves himself around with less upper body.  On Thursday morning he felt a popping sensation in his left shoulder and had pain in his left shoulder.  He has noticed discoloration moving down across his chest into his upper abdomen. On physical exam patient has deformity of left clavicle Shoulder joint appears to be intact There is contusion and discoloration across the lower chest and upper abdomen Left shoulder x-[facility] Milhouse reveals medial clavicle fracture CT was obtained and shows some hematoma surrounding this. Suspect clavicle fracture with hematoma and due to patient's anticoagulation he has had in some increased bleeding.  He is hemodynamically stable here with stable hemoglobin Care discussed with Dr. Zachery Dakins, on-call for orthopedic surgery.  He will see the patient tomorrow or Tuesday The patient has continued to move himself and use his arm.  He is dependent upon his upper extremities for movement. He is advised regarding follow-up.  Will hold his Coumadin pending follow-up with orthopedics in the next 1 to 2 days.        Pattricia Boss, MD 01/11/22 [phone]

## 2022-01-11 NOTE — ED Provider Notes (Signed)
[facility] EMERGENCY DEPT Provider Note   CSN: [id number] Arrival date & time: 01/11/22  0830     History {Add pertinent medical, surgical, social history, OB history to HPI:1} Chief Complaint  Patient presents with   Bleeding/Bruising    Mark Russell is a 87 y.o. male with a past medical history of BPH, COPD, diabetes, DVT, GERD, BKA, hypertension, AAA presenting to the emergency room for evaluation of bruises.  Patient reports that on Thursday when he was trying to get out of bed, he leaned on his left elbow and had a pop in his left shoulder.  He reports initially he had a bruise around his shoulder but now the bruises has spread across his chest, below his breast and on the left flank.  He also reports swelling and redness on the left arm.  He reports that pain is mostly in the L shoulder.  He denies having any other symptoms.  No fever, chest pain, shortness of breath, nausea or vomiting.  HPI    Past Medical History:  Diagnosis Date   Anxiety    Avascular necrosis (HCC)    BPH (benign prostatic hyperplasia)    COPD (chronic obstructive pulmonary disease) (HCC)    Depression    Diabetes mellitus    DVT (deep venous thrombosis) (HCC)    GERD (gastroesophageal reflux disease)    History of leg amputation (HCC)    both legs   Hypertension    Neuropathy    Peripheral vascular disease ([place])    Sleep apnea    Past Surgical History:  Procedure Laterality Date   ABDOMINAL AORTIC ANEURYSM [place] N/A 04/27/2012   Procedure: BALLOON DILATION;  Surgeon: Lear Ng, MD;  Location: [facility] ENDOSCOPY;  Service: Endoscopy;  Laterality: N/A;  no xray   BYPASS GRAFT     ESOPHAGOGASTRODUODENOSCOPY N/A 04/27/2012   Procedure: ESOPHAGOGASTRODUODENOSCOPY (EGD);  Surgeon: Lear Ng, MD;  Location: [facility];  Service: Endoscopy;  Laterality: N/A;   LEG AMPUTATION THROUGH KNEE  06/2004     Home  Medications Prior to Admission medications   Medication Sig Start Date End Date Taking? Authorizing Provider  acetaminophen (TYLENOL) 325 MG tablet Take 650 mg by mouth every 6 (six) hours as needed for mild pain, fever or headache.    [provider]  albuterol (VENTOLIN HFA) 108 (90 Base) MCG/ACT inhaler Inhale 2 puffs into the lungs every 6 (six) hours as needed for wheezing or shortness of breath. 06/10/20   Shelly Coss, MD  amLODipine (NORVASC) 10 MG tablet Take 1 tablet (10 mg total) by mouth daily. 09/14/20   Elgergawy, Silver Huguenin, MD  Ascorbic Acid (VITAMIN C PO) Take 1 tablet by mouth daily.    [provider]  bismuth subsalicylate (PEPTO BISMOL) 262 MG/15ML suspension Take 30 mLs by mouth every 6 (six) hours as needed for diarrhea or loose stools.    [provider]  Cholecalciferol (VITAMIN D) 50 MCG (2000 UT) tablet Take 2,000 Units by mouth daily.    [provider]  colchicine 0.6 MG tablet Take 0.5 tablets (0.3 mg total) by mouth daily. 09/14/20   Elgergawy, Silver Huguenin, MD  donepezil (ARICEPT) 10 MG tablet Take 10 mg by mouth at bedtime. 04/08/20   [provider]  DULoxetine (CYMBALTA) 30 MG capsule Take 30 mg by mouth daily.    [provider]  fentaNYL (DURAGESIC) 25 MCG/HR Place 1 patch onto  the skin every 3 (three) days.    [provider]  FLUoxetine (PROZAC) 20 MG capsule Take 40 mg by mouth daily.    [provider]  gabapentin (NEURONTIN) 300 MG capsule Take 300 mg by mouth 3 (three) times daily. 06/07/15   [provider]  glimepiride (AMARYL) 4 MG tablet Take 4 mg by mouth daily with breakfast.    [provider]  guaiFENesin-dextromethorphan (ROBITUSSIN DM) 100-10 MG/5ML syrup Take 10 mLs by mouth every 4 (four) hours as needed for cough. Patient not taking: No sig reported 06/10/20   Burnadette Pop, MD  hydrALAZINE (APRESOLINE) 25 MG tablet Take 1 tablet (25 mg total) by mouth 4 (four)  times daily. 09/13/20   Elgergawy, Leana Roe, MD  metoprolol tartrate (LOPRESSOR) 25 MG tablet Take 1 tablet (25 mg total) by mouth 2 (two) times daily. 06/10/20 06/10/21  Burnadette Pop, MD  mineral oil-hydrophilic petrolatum (AQUAPHOR) ointment Apply 1 application topically daily as needed (bed sores).    [provider]  PERCOCET 7.5-325 MG tablet Take 1 tablet by mouth every 6 (six) hours as needed for pain. 05/22/20   [provider]  potassium chloride (KLOR-CON) 10 MEQ tablet Take 1 tablet (10 mEq total) by mouth 2 (two) times daily for 14 days. 10/02/20 10/16/20  Haskel Schroeder, PA-C  simvastatin (ZOCOR) 40 MG tablet Take 40 mg by mouth every evening.    [provider]  SYMBICORT 160-4.5 MCG/ACT inhaler Inhale 3 puffs into the lungs every 6 (six) hours as needed for wheezing or shortness of breath. 05/24/20   [provider]  tamsulosin (FLOMAX) 0.4 MG CAPS capsule Take 0.4 mg by mouth daily. 05/24/20   [provider]  vitamin B-12 (CYANOCOBALAMIN) 500 MCG tablet Take 1 tablet (500 mcg total) by mouth daily. 09/13/20   Elgergawy, Leana Roe, MD  warfarin (COUMADIN) 4 MG tablet Take 2 mg by mouth daily.    [provider]  zinc gluconate 50 MG tablet Take 50 mg by mouth daily.    [provider]      Allergies    Ace inhibitors, Dilaudid [hydromorphone hcl], Metformin and related, Morphine and related, and Pletal [cilostazol]    Review of Systems   Review of Systems  Skin:        Bruises    Physical Exam Updated Vital Signs BP (!) 166/96   Pulse 70   Temp 98.1 F (36.7 C) (Oral)   Resp 17   SpO2 97%  Physical Exam Vitals and nursing note reviewed.  Constitutional:      Appearance: Normal appearance.  HENT:     Head: Normocephalic and atraumatic.     Mouth/Throat:     Mouth: Mucous membranes are moist.  Eyes:     General: No scleral icterus. Cardiovascular:     Rate and Rhythm: Normal rate and regular rhythm.      Pulses: Normal pulses.     Heart sounds: Normal heart sounds.  Pulmonary:     Effort: Pulmonary effort is normal.     Breath sounds: Normal breath sounds.  Abdominal:     General: Abdomen is flat.     Palpations: Abdomen is soft.     Tenderness: There is no abdominal tenderness.  Musculoskeletal:        General: No deformity.  Skin:    General: Skin is warm.     Findings: No rash.     Comments: Big bruises on the L shoulder, across the  chest, under the breast, left flank and left arm with some swelling.  Neurological:     General: No focal deficit present.     Mental Status: He is alert.  Psychiatric:        Mood and Affect: Mood normal.     ED Results / Procedures / Treatments   Labs (all labs ordered are listed, but only abnormal results are displayed) Labs Reviewed  PROTIME-INR  CBC  BASIC METABOLIC PANEL  CK    EKG None  Radiology DG Shoulder Left  Result Date: 01/11/2022 CLINICAL DATA:  Bruising and pain, injury Thursday morning getting out of bed, felt a pop, bruising spreading to chest wall EXAM: LEFT SHOULDER - 2+ VIEW COMPARISON:  09/09/2020 FINDINGS: No fracture or dislocation of the left shoulder. Mild left acromioclavicular arthrosis. Severe glenohumeral arthrosis with a high riding position of the humeral head and subacromial pseudoarthrosis in keeping with chronic rotator cuff tear, appearance unchanged compared to prior examination. Soft tissues and partially imaged chest are unremarkable. IMPRESSION: 1. No fracture or dislocation of the left shoulder. 2. Severe glenohumeral arthrosis with a high riding position of the humeral head and subacromial pseudoarthrosis in keeping with chronic rotator cuff tear. Electronically Signed   By: Jearld Lesch M.D.   On: 01/11/2022 09:29   DG Chest Port 1 View  Result Date: 01/11/2022 CLINICAL DATA:  Shortness of breath. EXAM: PORTABLE CHEST 1 VIEW COMPARISON:  09/24/2020 FINDINGS: Subtle focal opacity is identified in the  right upper lobe. There is pulmonary vascular congestion without overt pulmonary edema. Low volume film with mild asymmetric elevation left hemidiaphragm. Nodular opacity identified left upper lobe. The cardio pericardial silhouette is enlarged. Degenerative changes noted in the right shoulder. IMPRESSION: 1. Subtle focal opacity in the right upper lobe may be pneumonia. 2. Small nodular opacity left upper lobe. CT chest without contrast recommended to further evaluate. 3. Pulmonary vascular congestion without overt pulmonary edema. Electronically Signed   By: Kennith Center M.D.   On: 01/11/2022 09:28    Procedures Procedures  {Document cardiac monitor, telemetry assessment procedure when appropriate:1}  Medications Ordered in ED Medications  oxyCODONE-acetaminophen (PERCOCET/ROXICET) 5-325 MG per tablet 1 tablet (has no administration in time range)    ED Course/ Medical Decision Making/ A&P                           Medical Decision Making Amount and/or Complexity of Data Reviewed Labs: ordered. Radiology: ordered.  Risk Prescription drug management.   This patient presents to the ED for ***, this involves an extensive number of treatment options, and is a complaint that carries with a high risk of complications and morbidity.  The differential diagnosis includes ***.  This is not an exhaustive list.  Lab tests: I ordered and personally interpreted labs.  The pertinent results include: WBC unremarkable. Hbg unremarkable. Platelets unremarkable. No electrolyte abnormalities noted.  BUN, creatinine unremarkable. UA significant for no acute abnormality. ***  Imaging studies: I ordered imaging studies. I personally reviewed, interpreted imaging and agree with the radiologist's interpretations. The results include: ***   Problem list/ ED course/ Critical interventions/ Medical management: HPI: See above Vital signs ***within normal range and stable throughout  visit. Laboratory/imaging studies significant for: See above. On physical examination, patient is afebrile and appears in no acute distress. ***.  Based on patient's clinical presentations and laboratory/imaging studies I suspect ***. Reevaluation of the patient after these medications showed that the  patient {resolved/improved/worsened:23923::"improved"}. Advised patient to ***. I have reviewed the patient home medicines and have made adjustments as needed.  Cardiac monitoring/EKG: The patient was maintained on a cardiac monitor.  I personally reviewed and interpreted the cardiac monitor which showed an underlying rhythm of: sinus rhythm.  Additional history obtained: External records from outside source obtained and reviewed including: Chart review including previous notes, labs, imaging.  Consultations obtained: I requested consultation with Dr. ***, and discussed lab and imaging findings as well as pertinent plan.  He/she agrees with the plan***.  Disposition Continued outpatient therapy. Follow-up with PCP*** recommended for reevaluation of symptoms. Treatment plan discussed with patient.  Pt acknowledged understanding was agreeable to the plan. Worrisome signs and symptoms were discussed with patient, and patient acknowledged understanding to return to the ED if they noticed these signs and symptoms. Patient was stable upon discharge.   This chart was dictated using voice recognition software.  Despite best efforts to proofread,  errors can occur which can change the documentation meaning.    {Document critical care time when appropriate:1} {Document review of labs and clinical decision tools ie heart score, Chads2Vasc2 etc:1}  {Document your independent review of radiology images, and any outside records:1} {Document your discussion with family members, caretakers, and with consultants:1} {Document social determinants of health affecting pt's care:1} {Document your decision making why  or why not admission, treatments were needed:1} Final Clinical Impression(s) / ED Diagnoses Final diagnoses:  None    Rx / DC Orders ED Discharge Orders     None

## 2022-01-11 NOTE — ED Triage Notes (Signed)
Thursday morning, getting oob, leaning on his left shoulder and felt a pop, pt is on coumadin, he has noticed a large bruise under his left breast and has moved to cover his whole breast area on both sides. No weakness or lethargy noted.

## 2022-01-11 NOTE — ED Notes (Signed)
Patient requested to remain in motorized chair as it is more comfortable than being transferred to the hospital stretcher.

## 2022-01-11 NOTE — ED Notes (Signed)
Patient transported to CT 

## 2022-01-13 DIAGNOSIS — S42025A Nondisplaced fracture of shaft of left clavicle, initial encounter for closed fracture: Secondary | ICD-10-CM | POA: Diagnosis not present

## 2022-01-14 ENCOUNTER — Other Ambulatory Visit (HOSPITAL_COMMUNITY): Payer: Self-pay | Admitting: Family Medicine

## 2022-01-14 DIAGNOSIS — S42002D Fracture of unspecified part of left clavicle, subsequent encounter for fracture with routine healing: Secondary | ICD-10-CM | POA: Diagnosis not present

## 2022-01-14 DIAGNOSIS — T148XXD Other injury of unspecified body region, subsequent encounter: Secondary | ICD-10-CM | POA: Diagnosis not present

## 2022-01-14 DIAGNOSIS — R918 Other nonspecific abnormal finding of lung field: Secondary | ICD-10-CM

## 2022-01-14 DIAGNOSIS — D6869 Other thrombophilia: Secondary | ICD-10-CM | POA: Diagnosis not present

## 2022-01-14 DIAGNOSIS — S88912D Complete traumatic amputation of left lower leg, level unspecified, subsequent encounter: Secondary | ICD-10-CM | POA: Diagnosis not present

## 2022-01-14 DIAGNOSIS — E876 Hypokalemia: Secondary | ICD-10-CM | POA: Diagnosis not present

## 2022-01-14 DIAGNOSIS — I480 Paroxysmal atrial fibrillation: Secondary | ICD-10-CM | POA: Diagnosis not present

## 2022-01-14 DIAGNOSIS — S88911D Complete traumatic amputation of right lower leg, level unspecified, subsequent encounter: Secondary | ICD-10-CM | POA: Diagnosis not present

## 2022-01-23 DIAGNOSIS — F329 Major depressive disorder, single episode, unspecified: Secondary | ICD-10-CM | POA: Diagnosis not present

## 2022-01-23 DIAGNOSIS — G894 Chronic pain syndrome: Secondary | ICD-10-CM | POA: Diagnosis not present

## 2022-01-23 DIAGNOSIS — Z7901 Long term (current) use of anticoagulants: Secondary | ICD-10-CM | POA: Diagnosis not present

## 2022-01-23 DIAGNOSIS — G546 Phantom limb syndrome with pain: Secondary | ICD-10-CM | POA: Diagnosis not present

## 2022-01-23 DIAGNOSIS — G4700 Insomnia, unspecified: Secondary | ICD-10-CM | POA: Diagnosis not present

## 2022-01-29 ENCOUNTER — Encounter (HOSPITAL_COMMUNITY)
Admission: RE | Admit: 2022-01-29 | Discharge: 2022-01-29 | Disposition: A | Discharge status: Home / Self Care | Payer: Medicare PPO | Source: Ambulatory Visit | Attending: Family Medicine | Admitting: Family Medicine

## 2022-01-29 DIAGNOSIS — R918 Other nonspecific abnormal finding of lung field: Secondary | ICD-10-CM

## 2022-01-29 DIAGNOSIS — M1612 Unilateral primary osteoarthritis, left hip: Secondary | ICD-10-CM | POA: Diagnosis not present

## 2022-01-29 DIAGNOSIS — S42002A Fracture of unspecified part of left clavicle, initial encounter for closed fracture: Secondary | ICD-10-CM | POA: Diagnosis not present

## 2022-01-29 DIAGNOSIS — K439 Ventral hernia without obstruction or gangrene: Secondary | ICD-10-CM | POA: Diagnosis not present

## 2022-01-29 DIAGNOSIS — C7951 Secondary malignant neoplasm of bone: Secondary | ICD-10-CM | POA: Diagnosis not present

## 2022-01-29 LAB — GLUCOSE, CAPILLARY: Glucose-Capillary: 99 mg/dL (ref 70–99)

## 2022-01-29 MED ORDER — FLUDEOXYGLUCOSE F - 18 (FDG) INJECTION
9.0000 | Freq: Once | INTRAVENOUS | Status: AC | PRN
  Administered 2022-01-29: 9 via INTRAVENOUS

## 2022-02-02 DIAGNOSIS — C801 Malignant (primary) neoplasm, unspecified: Secondary | ICD-10-CM | POA: Diagnosis not present

## 2022-02-02 DIAGNOSIS — E1152 Type 2 diabetes mellitus with diabetic peripheral angiopathy with gangrene: Secondary | ICD-10-CM | POA: Diagnosis not present

## 2022-02-02 DIAGNOSIS — J441 Chronic obstructive pulmonary disease with (acute) exacerbation: Secondary | ICD-10-CM | POA: Diagnosis not present

## 2022-02-03 ENCOUNTER — Other Ambulatory Visit: Payer: Medicare PPO

## 2022-02-03 DIAGNOSIS — Z515 Encounter for palliative care: Secondary | ICD-10-CM

## 2022-02-04 NOTE — Progress Notes (Signed)
COMMUNITY PALLIATIVE CARE SW NOTE  PATIENT NAME: Mark Russell DOB: 09/14/1935 MRN: [id number]  PRIMARY CARE PROVIDER: Collene Leyden, MD  RESPONSIBLE PARTY:  Acct ID - Guarantor Home Phone Work Phone Relationship Acct Type  [id number] REMIEL, CORTI* [phone]  Self P/F     [place], [place], [place] [id number]   Clinical Social Work Initial Palliative Care Telephonic Encounter  PC SW contacted patient's daughter-Jamie to discuss palliative care referral. SW provided education to [place] regarding the palliative care program, which she was very open to ongoing support for her father. She provided a brief update on patient's condition. Patient has cancer with metastases. He has not been feeling well overall. He currently  (since January) has a knot on the side of his neck the size of a baseball and it is causing him a great deal of pain. She report that she is rubbing mineral oil on it and it is giving him some relief. He is eating okay. Patient is double amputee and has dementia.    SOCIAL HX: Patient has been widowed for 3 years. He has three children. He worked in [employer]. He has no Careers adviser.   Social History   Tobacco Use   Smoking status: Former    Packs/day: 0.25    Years: 30.00    Total pack years: 7.50    Types: Cigarettes    Quit date: 08/06/1991    Years since quitting: 30.5   Smokeless tobacco: Never  Substance Use Topics   Alcohol use: No    CODE STATUS: Full Code ADVANCED DIRECTIVES: No MOST FORM COMPLETE:  No HOSPICE EDUCATION PROVIDED: Yes  Duration of telephonic visit and documentation: 30 minutes  [employer], LCSW

## 2022-02-10 ENCOUNTER — Other Ambulatory Visit: Payer: Medicare PPO

## 2022-02-10 ENCOUNTER — Other Ambulatory Visit: Payer: Medicare PPO | Admitting: Hospice

## 2022-02-10 VITALS — BP 126/68 | HR 82 | Temp 100.2°F

## 2022-02-10 DIAGNOSIS — Z515 Encounter for palliative care: Secondary | ICD-10-CM

## 2022-02-10 NOTE — Progress Notes (Signed)
COMMUNITY PALLIATIVE CARE SW NOTE  PATIENT NAME: Mark Russell DOB: March 25, 1935 MRN: [id number]  PRIMARY CARE PROVIDER: Collene Leyden, MD  RESPONSIBLE PARTY:  Acct ID - Guarantor Home Phone Work Phone Relationship Acct Type  [id number] - Percle,WILFR* [phone]  Self P/F     [place], [place], [place] [address]   Initial Clinical Social Work Visit/ Re-admission  Palliative care SW and Nurse-D. Georgann Housekeeper completed an initial joint visit with patient at his home. His two daughters were present with him. Patient was in an electric chair. Patient appears to be alert and oriented x3, but is repetitive in statements family was provided education regarding palliative care. Patient and family agree to palliative care services and signed consent for services.  He is a double below-knee amputee and has been that way for 18 years. His stumps hurt him all the time. They jerk and move involuntarily. SW observed patient jerking and rubbing his stumps throughout the visit.  The blue mineral ice is what he rubs on  it daily and provide some release. Patient has a large knot on the left side near the clavicle that does not cause any pain. Patient report that his appetite is good, he is eating 2 meals and snacks throughout the day. Patient also drives as he has a modified van to run errands. His oldest daughter take him to his doctor's appointment. Patient report that he does not sleep well, but daughters report he sleeps through the night, but is an early riser.  Education was provided regarding hospice care. Patient/family interested in services. A consult is requested.     Goal: Patient would like to remain in his home through death.   Patient desires to be a DNR. 2 signed DNR's left in the home after NP-L.Eshiet confirmed patient's desire for code status.  *Demential workbook provided to the family as an additional educational tool.   SOCIAL HX: Patient was born and raised in [place], [place]. He was a  furniture. Patient is widowed. Patient has two daughters and one son, who lives in the home.  Social History   Tobacco Use   Smoking status: Former    Packs/day: 0.25    Years: 30.00    Total pack years: 7.50    Types: Cigarettes    Quit date: 08/06/1991    Years since quitting: 30.5   Smokeless tobacco: Never  Substance Use Topics   Alcohol use: No    CODE STATUS: DNR left in the home. ADVANCED DIRECTIVES: No MOST FORM COMPLETE:  No HOSPICE EDUCATION PROVIDED: Yes  Duration of visit and documentation: 60 minutes  Deborahann Poteat, LCSW

## 2022-02-10 NOTE — Progress Notes (Signed)
PATIENT NAME: Mark Russell DOB: 03-26-1935 MRN: [id number]  PRIMARY CARE PROVIDER: Collene Leyden, MD  RESPONSIBLE PARTY:  Acct ID - Guarantor Home Phone Work Phone Relationship Acct Type  [id number] - Mark Russell,Mark [phone]  Self P/F     [place], [place], [place] [address]   Palliative Care Encounter Note   Completed visit with Mark Russell, SW. Daughters also present    Respiratory: Rales in the Right side of his lungs; expiratory wheezing throughout  Cardiac:   Cognitive: Dementia   Appetite: 2 meals and snacks throughout the day; eats a reduced amount of food   Mobility: double amputee; electric wheelchair   ADLs: does what he can while in his electric wheelchair but needs assist; family reports pt does not want to take a shower nor does he want to bathe   Sleeping Pattern: doesn't sleep well; gets up at night to use bathroom and d/t pain level  Pain: at the beginning of the visit this patient reported 0/10 pain level but w/in 1hr patient had 10/10 pain level  Palliative Care/ Hospice: RN explained role and purpose of palliative care including visit frequency. Also discussed benefits of hospice care as well as the differences between the two with patient. Gave Dementia booklet. Called Dr. Kirby Funk office for hospice referral and office returned call in acknowledgement.  Goals of Care: To stay in the home until death   HISTORY OF PRESENT ILLNESS:    CODE STATUS: DNR  ADVANCED DIRECTIVES: N MOST FORM: N PPS: 40%   PHYSICAL EXAM:   VITALS: Today's Vitals   02/10/22 0919  BP: 126/68  Pulse: 82  Temp: 100.2 F (37.9 C)  TempSrc: Temporal  SpO2: 96%    LUNGS: positive findings: rales left upper anterior and left upper posterior, wheezing right upper anterior, right lower anterior, left upper anterior, and left lower anterior CARDIAC: Cor RRR EXTREMITIES: no BLE SKIN: warm, dry  NEURO: negative       Mark [place] Georgann Housekeeper, LPN

## 2022-02-10 NOTE — Progress Notes (Signed)
    [facility] Consult Note Telephone: [phone]  Fax: [phone]    Date of encounter: 02/10/22 PATIENT NAME: Mark Russell [id number]   [phone] (home)  DOB: 11-Feb-1935 MRN: [id number] PRIMARY CARE PROVIDER:    Collene Leyden, MD,  [place] [address] [phone]  REFERRING PROVIDER:   Collene Leyden, MD [address] [place],  [place] [address] [phone]  RESPONSIBLE PARTY:    Contact Information     Name Relation Home Work [place] Daughter   [phone]   Mark Russell [phone]  [phone]   Mark Russell Daughter [phone]  [phone]        TELEHEALTH VISIT STATEMENT Due to the COVID-19 crisis, this visit was done via telemedicine from my office and it was initiated and consent by this patient and or family.  I connected with patient OR PROXY by a telephone/video  and verified that I am speaking with the correct person. I discussed the limitations of evaluation and management by telemedicine. The patient expressed understanding and agreed to proceed.  This visit was requested by social worker Mark Russell, and she facilitated the visit. Patient's 2 daughters and Mark Russell social worker will present with patient during visit. Palliative Care was asked to follow this patient to address advance care planning and goals of care clarification.                                    ASSESSMENT AND PLAN / RECOMMENDATIONS:   Advance Care Planning/Goals of Care: Goals include to maximize quality of life and symptom management. Patient/health care surrogate gave his/her permission to discuss.Our advance care planning conversation included a discussion about:    The value and importance of advance care planning  Identification and preparation of a healthcare agent  Review and updating or creation of an  advance directive document  . Decision not to resuscitate or to de-escalate disease focused treatments due to poor prognosis.  CODE STATUS: Patient affirmed he is a DO NOT RESUSCITATE.  I spent 16 minutes providing this consultation. More than 50% of the time in this consultation was spent in counseling and care coordination.  Thank you for the opportunity to participate in the care of [name].  The palliative care team will continue to follow. Please call our office at [phone] if we can be of additional assistance.   Mark Spray, NP

## 2022-02-23 ENCOUNTER — Emergency Department (HOSPITAL_COMMUNITY)

## 2022-02-23 ENCOUNTER — Emergency Department (HOSPITAL_BASED_OUTPATIENT_CLINIC_OR_DEPARTMENT_OTHER)

## 2022-02-23 ENCOUNTER — Encounter (HOSPITAL_COMMUNITY): Payer: Self-pay

## 2022-02-23 ENCOUNTER — Inpatient Hospital Stay (HOSPITAL_COMMUNITY)
Admission: EM | Admit: 2022-02-23 | Discharge: 2022-03-06 | DRG: 193 | Disposition: E | Attending: Internal Medicine | Admitting: Internal Medicine

## 2022-02-23 ENCOUNTER — Other Ambulatory Visit: Payer: Self-pay

## 2022-02-23 DIAGNOSIS — I5021 Acute systolic (congestive) heart failure: Secondary | ICD-10-CM | POA: Diagnosis present

## 2022-02-23 DIAGNOSIS — R791 Abnormal coagulation profile: Secondary | ICD-10-CM | POA: Diagnosis present

## 2022-02-23 DIAGNOSIS — I110 Hypertensive heart disease with heart failure: Secondary | ICD-10-CM | POA: Diagnosis present

## 2022-02-23 DIAGNOSIS — Z888 Allergy status to other drugs, medicaments and biological substances status: Secondary | ICD-10-CM

## 2022-02-23 DIAGNOSIS — M7989 Other specified soft tissue disorders: Secondary | ICD-10-CM

## 2022-02-23 DIAGNOSIS — J90 Pleural effusion, not elsewhere classified: Secondary | ICD-10-CM | POA: Insufficient documentation

## 2022-02-23 DIAGNOSIS — R531 Weakness: Secondary | ICD-10-CM

## 2022-02-23 DIAGNOSIS — Z86718 Personal history of other venous thrombosis and embolism: Secondary | ICD-10-CM

## 2022-02-23 DIAGNOSIS — Z8679 Personal history of other diseases of the circulatory system: Secondary | ICD-10-CM

## 2022-02-23 DIAGNOSIS — Z79891 Long term (current) use of opiate analgesic: Secondary | ICD-10-CM

## 2022-02-23 DIAGNOSIS — Z7901 Long term (current) use of anticoagulants: Secondary | ICD-10-CM

## 2022-02-23 DIAGNOSIS — J189 Pneumonia, unspecified organism: Secondary | ICD-10-CM | POA: Diagnosis not present

## 2022-02-23 DIAGNOSIS — N400 Enlarged prostate without lower urinary tract symptoms: Secondary | ICD-10-CM | POA: Diagnosis present

## 2022-02-23 DIAGNOSIS — Z6841 Body Mass Index (BMI) 40.0 and over, adult: Secondary | ICD-10-CM

## 2022-02-23 DIAGNOSIS — Z8249 Family history of ischemic heart disease and other diseases of the circulatory system: Secondary | ICD-10-CM

## 2022-02-23 DIAGNOSIS — Z885 Allergy status to narcotic agent status: Secondary | ICD-10-CM

## 2022-02-23 DIAGNOSIS — I739 Peripheral vascular disease, unspecified: Secondary | ICD-10-CM | POA: Diagnosis present

## 2022-02-23 DIAGNOSIS — M879 Osteonecrosis, unspecified: Secondary | ICD-10-CM | POA: Diagnosis present

## 2022-02-23 DIAGNOSIS — R0902 Hypoxemia: Secondary | ICD-10-CM | POA: Diagnosis present

## 2022-02-23 DIAGNOSIS — F32A Depression, unspecified: Secondary | ICD-10-CM | POA: Diagnosis present

## 2022-02-23 DIAGNOSIS — J9611 Chronic respiratory failure with hypoxia: Secondary | ICD-10-CM | POA: Insufficient documentation

## 2022-02-23 DIAGNOSIS — I480 Paroxysmal atrial fibrillation: Secondary | ICD-10-CM | POA: Diagnosis present

## 2022-02-23 DIAGNOSIS — A419 Sepsis, unspecified organism: Secondary | ICD-10-CM | POA: Diagnosis not present

## 2022-02-23 DIAGNOSIS — J9621 Acute and chronic respiratory failure with hypoxia: Secondary | ICD-10-CM | POA: Diagnosis present

## 2022-02-23 DIAGNOSIS — J449 Chronic obstructive pulmonary disease, unspecified: Secondary | ICD-10-CM | POA: Diagnosis present

## 2022-02-23 DIAGNOSIS — Z515 Encounter for palliative care: Secondary | ICD-10-CM

## 2022-02-23 DIAGNOSIS — Z89612 Acquired absence of left leg above knee: Secondary | ICD-10-CM

## 2022-02-23 DIAGNOSIS — Z89611 Acquired absence of right leg above knee: Secondary | ICD-10-CM

## 2022-02-23 DIAGNOSIS — J440 Chronic obstructive pulmonary disease with acute lower respiratory infection: Secondary | ICD-10-CM | POA: Diagnosis present

## 2022-02-23 DIAGNOSIS — Z79899 Other long term (current) drug therapy: Secondary | ICD-10-CM

## 2022-02-23 DIAGNOSIS — I70209 Unspecified atherosclerosis of native arteries of extremities, unspecified extremity: Secondary | ICD-10-CM | POA: Diagnosis present

## 2022-02-23 DIAGNOSIS — Z87891 Personal history of nicotine dependence: Secondary | ICD-10-CM

## 2022-02-23 DIAGNOSIS — R52 Pain, unspecified: Secondary | ICD-10-CM | POA: Diagnosis not present

## 2022-02-23 DIAGNOSIS — R042 Hemoptysis: Secondary | ICD-10-CM | POA: Diagnosis not present

## 2022-02-23 DIAGNOSIS — G629 Polyneuropathy, unspecified: Secondary | ICD-10-CM | POA: Diagnosis present

## 2022-02-23 DIAGNOSIS — F419 Anxiety disorder, unspecified: Secondary | ICD-10-CM | POA: Diagnosis present

## 2022-02-23 DIAGNOSIS — Z66 Do not resuscitate: Secondary | ICD-10-CM | POA: Diagnosis present

## 2022-02-23 DIAGNOSIS — C3490 Malignant neoplasm of unspecified part of unspecified bronchus or lung: Secondary | ICD-10-CM | POA: Diagnosis present

## 2022-02-23 DIAGNOSIS — I10 Essential (primary) hypertension: Secondary | ICD-10-CM | POA: Diagnosis present

## 2022-02-23 DIAGNOSIS — Z9981 Dependence on supplemental oxygen: Secondary | ICD-10-CM

## 2022-02-23 DIAGNOSIS — E1151 Type 2 diabetes mellitus with diabetic peripheral angiopathy without gangrene: Secondary | ICD-10-CM | POA: Diagnosis present

## 2022-02-23 DIAGNOSIS — Z7984 Long term (current) use of oral hypoglycemic drugs: Secondary | ICD-10-CM

## 2022-02-23 DIAGNOSIS — I2489 Other forms of acute ischemic heart disease: Secondary | ICD-10-CM | POA: Diagnosis present

## 2022-02-23 DIAGNOSIS — G4730 Sleep apnea, unspecified: Secondary | ICD-10-CM | POA: Diagnosis present

## 2022-02-23 DIAGNOSIS — K219 Gastro-esophageal reflux disease without esophagitis: Secondary | ICD-10-CM | POA: Diagnosis present

## 2022-02-23 LAB — CBC WITH DIFFERENTIAL/PLATELET
Abs Immature Granulocytes: 0.08 10*3/uL — ABNORMAL HIGH (ref 0.00–0.07)
Basophils Absolute: 0.1 10*3/uL (ref 0.0–0.1)
Basophils Relative: 1 %
Eosinophils Absolute: 0.1 10*3/uL (ref 0.0–0.5)
Eosinophils Relative: 1 %
HCT: 39 % (ref 39.0–52.0)
Hemoglobin: 11.8 g/dL — ABNORMAL LOW (ref 13.0–17.0)
Immature Granulocytes: 1 %
Lymphocytes Relative: 4 %
Lymphs Abs: 0.4 10*3/uL — ABNORMAL LOW (ref 0.7–4.0)
MCH: 31.1 pg (ref 26.0–34.0)
MCHC: 30.3 g/dL (ref 30.0–36.0)
MCV: 102.9 fL — ABNORMAL HIGH (ref 80.0–100.0)
Monocytes Absolute: 2.1 10*3/uL — ABNORMAL HIGH (ref 0.1–1.0)
Monocytes Relative: 20 %
Neutro Abs: 7.7 10*3/uL (ref 1.7–7.7)
Neutrophils Relative %: 73 %
Platelets: 365 10*3/uL (ref 150–400)
RBC: 3.79 MIL/uL — ABNORMAL LOW (ref 4.22–5.81)
RDW: 17.1 % — ABNORMAL HIGH (ref 11.5–15.5)
WBC: 10.6 10*3/uL — ABNORMAL HIGH (ref 4.0–10.5)
nRBC: 0 % (ref 0.0–0.2)

## 2022-02-23 LAB — COMPREHENSIVE METABOLIC PANEL
ALT: 67 U/L — ABNORMAL HIGH (ref 0–44)
AST: 32 U/L (ref 15–41)
Albumin: 3 g/dL — ABNORMAL LOW (ref 3.5–5.0)
Alkaline Phosphatase: 72 U/L (ref 38–126)
Anion gap: 10 (ref 5–15)
BUN: 8 mg/dL (ref 8–23)
CO2: 30 mmol/L (ref 22–32)
Calcium: 7.5 mg/dL — ABNORMAL LOW (ref 8.9–10.3)
Chloride: 96 mmol/L — ABNORMAL LOW (ref 98–111)
Creatinine, Ser: 0.75 mg/dL (ref 0.61–1.24)
GFR, Estimated: >60 mL/min (ref >60)
Glucose, Bld: 187 mg/dL — ABNORMAL HIGH (ref 70–99)
Potassium: 3.5 mmol/L (ref 3.5–5.1)
Sodium: 136 mmol/L (ref 135–145)
Total Bilirubin: 1 mg/dL (ref 0.3–1.2)
Total Protein: 6.9 g/dL (ref 6.5–8.1)

## 2022-02-23 LAB — PROTIME-INR
INR: 1.9 — ABNORMAL HIGH (ref 0.8–1.2)
Prothrombin Time: 21.7 seconds — ABNORMAL HIGH (ref 11.4–15.2)

## 2022-02-23 LAB — BLOOD GAS, VENOUS
Acid-Base Excess: 10.5 mmol/L — ABNORMAL HIGH (ref 0.0–2.0)
Bicarbonate: 36.7 mmol/L — ABNORMAL HIGH (ref 20.0–28.0)
O2 Saturation: 90.6 %
Patient temperature: 37
pCO2, Ven: 54 mmHg (ref 44–60)
pH, Ven: 7.44 — ABNORMAL HIGH (ref 7.25–7.43)
pO2, Ven: 58 mmHg — ABNORMAL HIGH (ref 32–45)

## 2022-02-23 LAB — TROPONIN I (HIGH SENSITIVITY)
Troponin I (High Sensitivity): 114 ng/L (ref <18)
Troponin I (High Sensitivity): 116 ng/L (ref <18)

## 2022-02-23 LAB — GLUCOSE, CAPILLARY: Glucose-Capillary: 177 mg/dL — ABNORMAL HIGH (ref 70–99)

## 2022-02-23 LAB — HEMOGLOBIN A1C
Hgb A1c MFr Bld: 6 % — ABNORMAL HIGH (ref 4.8–5.6)
Mean Plasma Glucose: 125.5 mg/dL

## 2022-02-23 LAB — AMMONIA: Ammonia: 48 umol/L — ABNORMAL HIGH (ref 9–35)

## 2022-02-23 MED ORDER — INSULIN ASPART 100 UNIT/ML IJ SOLN
0.0000 [IU] | Freq: Every day | INTRAMUSCULAR | Status: DC
  Filled 2022-02-23: qty 0.05

## 2022-02-23 MED ORDER — ACETAMINOPHEN 650 MG RE SUPP
650.0000 mg | Freq: Four times a day (QID) | RECTAL | Status: DC | PRN
  Administered 2022-02-26: 650 mg via RECTAL
  Filled 2022-02-23: qty 1

## 2022-02-23 MED ORDER — GLIMEPIRIDE 4 MG PO TABS
4.0000 mg | ORAL_TABLET | Freq: Every day | ORAL | Status: DC
  Administered 2022-02-24: 4 mg via ORAL
  Filled 2022-02-23 (×2): qty 1

## 2022-02-23 MED ORDER — AMLODIPINE BESYLATE 10 MG PO TABS
10.0000 mg | ORAL_TABLET | Freq: Every day | ORAL | Status: DC
  Administered 2022-02-23 – 2022-02-27 (×5): 10 mg via ORAL
  Filled 2022-02-23 (×5): qty 1

## 2022-02-23 MED ORDER — HYDROXYZINE PAMOATE 25 MG PO CAPS: 25.0000 mg | ORAL_CAPSULE | Freq: Every evening | ORAL | Status: DC | PRN

## 2022-02-23 MED ORDER — TAMSULOSIN HCL 0.4 MG PO CAPS
0.4000 mg | ORAL_CAPSULE | Freq: Every day | ORAL | Status: DC
  Administered 2022-02-23 – 2022-02-27 (×5): 0.4 mg via ORAL
  Filled 2022-02-23 (×5): qty 1

## 2022-02-23 MED ORDER — BACLOFEN 10 MG PO TABS
5.0000 mg | ORAL_TABLET | Freq: Three times a day (TID) | ORAL | Status: DC
  Administered 2022-02-23 – 2022-02-28 (×12): 5 mg via ORAL
  Filled 2022-02-23 (×12): qty 1

## 2022-02-23 MED ORDER — SODIUM CHLORIDE 0.9 % IV SOLN
1.0000 g | Freq: Once | INTRAVENOUS | Status: AC
  Administered 2022-02-23: 1 g via INTRAVENOUS
  Filled 2022-02-23: qty 10

## 2022-02-23 MED ORDER — HYDROXYZINE HCL 25 MG PO TABS
25.0000 mg | ORAL_TABLET | Freq: Every evening | ORAL | Status: DC | PRN
  Administered 2022-02-28: 50 mg via ORAL
  Filled 2022-02-23: qty 2

## 2022-02-23 MED ORDER — DONEPEZIL HCL 10 MG PO TABS
10.0000 mg | ORAL_TABLET | Freq: Every day | ORAL | Status: DC
  Administered 2022-02-23 – 2022-02-27 (×3): 10 mg via ORAL
  Filled 2022-02-23 (×3): qty 1

## 2022-02-23 MED ORDER — WARFARIN - PHARMACIST DOSING INPATIENT: Freq: Every day | Status: DC

## 2022-02-23 MED ORDER — TRAZODONE HCL 50 MG PO TABS
25.0000 mg | ORAL_TABLET | Freq: Every evening | ORAL | Status: DC | PRN
  Administered 2022-02-27: 25 mg via ORAL
  Filled 2022-02-23: qty 1

## 2022-02-23 MED ORDER — DULOXETINE HCL 30 MG PO CPEP
30.0000 mg | ORAL_CAPSULE | Freq: Every day | ORAL | Status: DC
  Administered 2022-02-23 – 2022-02-28 (×6): 30 mg via ORAL
  Filled 2022-02-23 (×7): qty 1

## 2022-02-23 MED ORDER — IPRATROPIUM-ALBUTEROL 0.5-2.5 (3) MG/3ML IN SOLN
3.0000 mL | RESPIRATORY_TRACT | Status: DC | PRN
  Administered 2022-02-23: 3 mL via RESPIRATORY_TRACT
  Filled 2022-02-23: qty 3

## 2022-02-23 MED ORDER — SODIUM CHLORIDE 0.9 % IV SOLN
500.0000 mg | INTRAVENOUS | Status: DC
  Administered 2022-02-24 – 2022-02-26 (×3): 500 mg via INTRAVENOUS
  Filled 2022-02-23 (×4): qty 5

## 2022-02-23 MED ORDER — OXYCODONE-ACETAMINOPHEN 5-325 MG PO TABS
1.0000 | ORAL_TABLET | ORAL | Status: DC | PRN
  Administered 2022-02-23 – 2022-02-28 (×9): 2 via ORAL
  Filled 2022-02-23 (×9): qty 2

## 2022-02-23 MED ORDER — WARFARIN SODIUM 4 MG PO TABS
4.0000 mg | ORAL_TABLET | Freq: Once | ORAL | Status: AC
  Administered 2022-02-23: 4 mg via ORAL
  Filled 2022-02-23: qty 1

## 2022-02-23 MED ORDER — ACETAMINOPHEN 325 MG PO TABS
650.0000 mg | ORAL_TABLET | Freq: Four times a day (QID) | ORAL | Status: DC | PRN
  Administered 2022-02-24 – 2022-02-26 (×4): 650 mg via ORAL
  Filled 2022-02-23 (×5): qty 2

## 2022-02-23 MED ORDER — INSULIN ASPART 100 UNIT/ML IJ SOLN
0.0000 [IU] | Freq: Three times a day (TID) | INTRAMUSCULAR | Status: DC
  Administered 2022-02-24: 3 [IU] via SUBCUTANEOUS
  Administered 2022-02-24: 2 [IU] via SUBCUTANEOUS
  Administered 2022-02-25: 5 [IU] via SUBCUTANEOUS
  Administered 2022-02-25: 8 [IU] via SUBCUTANEOUS
  Administered 2022-02-26 – 2022-02-27 (×2): 3 [IU] via SUBCUTANEOUS
  Administered 2022-02-27: 2 [IU] via SUBCUTANEOUS
  Filled 2022-02-23: qty 0.15

## 2022-02-23 MED ORDER — ONDANSETRON HCL 4 MG PO TABS: 4.0000 mg | ORAL_TABLET | Freq: Four times a day (QID) | ORAL | Status: DC | PRN

## 2022-02-23 MED ORDER — SODIUM CHLORIDE 0.9 % IV SOLN: 1.0000 g | INTRAVENOUS | Status: DC

## 2022-02-23 MED ORDER — SODIUM CHLORIDE 0.9 % IV SOLN
500.0000 mg | Freq: Once | INTRAVENOUS | Status: AC
  Administered 2022-02-23: 500 mg via INTRAVENOUS
  Filled 2022-02-23: qty 5

## 2022-02-23 MED ORDER — IPRATROPIUM-ALBUTEROL 0.5-2.5 (3) MG/3ML IN SOLN
3.0000 mL | Freq: Three times a day (TID) | RESPIRATORY_TRACT | Status: DC
  Administered 2022-02-24 – 2022-02-27 (×10): 3 mL via RESPIRATORY_TRACT
  Filled 2022-02-23 (×10): qty 3

## 2022-02-23 MED ORDER — GABAPENTIN 300 MG PO CAPS
900.0000 mg | ORAL_CAPSULE | Freq: Three times a day (TID) | ORAL | Status: DC
  Administered 2022-02-23 – 2022-02-28 (×12): 900 mg via ORAL
  Filled 2022-02-23 (×12): qty 3

## 2022-02-23 MED ORDER — FENTANYL 25 MCG/HR TD PT72
1.0000 | MEDICATED_PATCH | TRANSDERMAL | Status: DC
  Administered 2022-02-25: 1 via TRANSDERMAL
  Filled 2022-02-23: qty 1

## 2022-02-23 MED ORDER — ONDANSETRON HCL 4 MG/2ML IJ SOLN: 4.0000 mg | Freq: Four times a day (QID) | INTRAMUSCULAR | Status: DC | PRN

## 2022-02-23 MED ORDER — MOMETASONE FURO-FORMOTEROL FUM 200-5 MCG/ACT IN AERO
2.0000 | INHALATION_SPRAY | Freq: Two times a day (BID) | RESPIRATORY_TRACT | Status: DC
  Filled 2022-02-23 (×2): qty 8.8

## 2022-02-23 NOTE — ED Notes (Signed)
Bloodwork collected and sent to lab. Md Tomi Bamberger okay for patient to have a few ice chips. Ice chips provided to daughter at bedside to give patient.

## 2022-02-23 NOTE — ED Notes (Signed)
Patient transported to X-ray 

## 2022-02-23 NOTE — H&P (Signed)
History and Physical  Mark Russell [id number] DOB: 04-30-1935 DOA: 02/13/2022   PCP: Mark Leyden, MD   Patient coming from: Home, lives with son.   Chief Complaint: Arm swelling, weakness   HPI: Mark Russell is a 87 y.o. male with medical history significant for peripheral vascular disease resulting in bilateral BKA, non-insulin-dependent type 2 diabetes, paroxysmal atrial fibrillation on Coumadin, recent diagnosis of lung cancer who declined treatment and lives with his son who is being admitted to the hospital with worsening weakness, bilateral arm swelling and inability to be cared for at home.  Patient has been on home hospice, history is provided by the patient's daughter who is also closely involved in his care.  She states that up until about 5 days ago, patient was still quite active, he gets around the home with his wheelchair, he is not confused, wears 2 L of oxygen at baseline.  Over the weekend, he started getting more and more weak.  On Friday, he was seen by hospice at home, they started him on low-dose of Xanax.  Also over the weekend, they discovered that his nasal cannula oxygen had malfunction, and there was a small piece of plastic blocking the flow of oxygen.  Patient got progressively weaker over the weekend, they also noticed that he developed worsening bilateral arm swelling.  This past Friday, family and also discussed with hospice and requested that he possibly be transition to inpatient hospice house, however they were told that he does not meet criteria.  In any case, due to the above issues, they brought him to the ER for evaluation.  ED Course: In the ER, patient has slightly elevated blood pressure, otherwise stable on 2 L nasal cannula oxygen.  Saturating well, not tachycardic.  Work reveals no leukocytosis 10.6, hemoglobin 11.8, calcium 7.5, elevated troponin 114, recheck 116.  INR 1.9.  Review of Systems: Please see HPI for pertinent positives and negatives. A  complete 10 system review of systems are otherwise negative.  Past Medical History:  Diagnosis Date   Anxiety    Avascular necrosis (HCC)    BPH (benign prostatic hyperplasia)    COPD (chronic obstructive pulmonary disease) (HCC)    Depression    Diabetes mellitus    DVT (deep venous thrombosis) (HCC)    GERD (gastroesophageal reflux disease)    History of leg amputation (HCC)    both legs   Hypertension    Neuropathy    Peripheral vascular disease ([place])    Sleep apnea    Past Surgical History:  Procedure Laterality Date   ABDOMINAL AORTIC ANEURYSM [place] N/A 04/27/2012   Procedure: BALLOON DILATION;  Surgeon: Lear Ng, MD;  Location: [facility];  Service: Endoscopy;  Laterality: N/A;  no xray   BYPASS GRAFT     ESOPHAGOGASTRODUODENOSCOPY N/A 04/27/2012   Procedure: ESOPHAGOGASTRODUODENOSCOPY (EGD);  Surgeon: Lear Ng, MD;  Location: [facility];  Service: Endoscopy;  Laterality: N/A;   LEG AMPUTATION THROUGH KNEE  06/2004    Social History:  reports that he quit smoking about 30 years ago. His smoking use included cigarettes. He has a 7.50 pack-year smoking history. He has never used smokeless tobacco. He reports that he does not drink alcohol. No history on file for drug use.   Allergies  Allergen Reactions   Ace Inhibitors     hypotension   Dilaudid [Hydromorphone Hcl]     "Mean"   Metformin  And Related     GI upset   Morphine And Related Other (See Comments)    "mean"   Pletal [Cilostazol]     SOB    Family History  Problem Relation Age of Onset   Heart attack Other    Cancer Other    Cancer Other    Heart attack Other    Esophageal cancer Daughter        Living, 46     Prior to Admission medications   Medication Sig Start Date End Date Taking? Authorizing Provider  acetaminophen (TYLENOL) 325 MG tablet Take 650 mg by mouth every 6 (six) hours as needed for mild pain, fever or  headache.   Yes [provider]  albuterol (VENTOLIN HFA) 108 (90 Base) MCG/ACT inhaler Inhale 2 puffs into the lungs every 6 (six) hours as needed for wheezing or shortness of breath. 06/10/20  Yes Shelly Coss, MD  amLODipine (NORVASC) 10 MG tablet Take 1 tablet (10 mg total) by mouth daily. 09/14/20  Yes Elgergawy, Silver Huguenin, MD  Baclofen 5 MG TABS Take 5 mg by mouth 3 (three) times daily.   Yes [provider]  bismuth subsalicylate (PEPTO BISMOL) 262 MG/15ML suspension Take 30 mLs by mouth every 6 (six) hours as needed for diarrhea or loose stools.   Yes [provider]  donepezil (ARICEPT) 10 MG tablet Take 10 mg by mouth at bedtime. 04/08/20  Yes [provider]  DULoxetine (CYMBALTA) 30 MG capsule Take 30 mg by mouth daily.   Yes [provider]  fentaNYL (DURAGESIC) 25 MCG/HR Place 1 patch onto the skin every 3 (three) days.   Yes [provider]  gabapentin (NEURONTIN) 300 MG capsule Take 900 mg by mouth 3 (three) times daily. 06/07/15  Yes [provider]  glimepiride (AMARYL) 4 MG tablet Take 4 mg by mouth daily with breakfast.   Yes [provider]  guaiFENesin-dextromethorphan (ROBITUSSIN DM) 100-10 MG/5ML syrup Take 10 mLs by mouth every 4 (four) hours as needed for cough. 06/10/20  Yes Shelly Coss, MD  hydrOXYzine (VISTARIL) 25 MG capsule Take 25-50 mg by mouth at bedtime as needed for anxiety.   Yes [provider]  ipratropium-albuterol (DUONEB) 0.5-2.5 (3) MG/3ML SOLN Take 3 mLs by nebulization every 4 (four) hours as needed (shortness of breath). 02/13/22  Yes [provider]  mineral oil-hydrophilic petrolatum (AQUAPHOR) ointment Apply 1 application topically daily as needed (bed sores).   Yes [provider]  oxyCODONE-acetaminophen (PERCOCET/ROXICET) 5-325 MG tablet Take 1 tablet by mouth every 6 (six) hours as needed for severe pain. Patient taking differently: Take 1-2 tablets by  mouth every 4 (four) hours as needed for severe pain. 01/11/22  Yes Rex Kras, PA  senna (SENOKOT) 8.6 MG TABS tablet Take 1-2 tablets by mouth daily as needed for mild constipation.   Yes [provider]  simvastatin (ZOCOR) 40 MG tablet Take 40 mg by mouth every evening.   Yes [provider]  SYMBICORT 160-4.5 MCG/ACT inhaler Inhale 2 puffs into the lungs 2 (two) times daily as needed for wheezing or shortness of breath. 05/24/20  Yes [provider]  tamsulosin (FLOMAX) 0.4 MG CAPS capsule Take 0.4 mg by mouth daily. 05/24/20  Yes [provider]  vitamin B-12 (CYANOCOBALAMIN) 500 MCG tablet Take 1 tablet (500 mcg total) by mouth daily. Patient taking differently: Take 5,000 mcg by mouth daily. 09/13/20  Yes Elgergawy, Silver Huguenin, MD  warfarin (COUMADIN) 4 MG tablet Take  2-4 mg by mouth See admin instructions. Taking 4 mg daily    except on Sunday taking 1/2 tab = 2 mg   Yes [provider]  potassium chloride (KLOR-CON) 10 MEQ tablet Take 10 mEq by mouth 3 (three) times daily. X 30 days Patient not taking: Reported on 02/13/2022    [provider]    Physical Exam: BP (!) 154/86 (BP Location: Right Arm)   Pulse 83   Temp 98.1 F (36.7 C)   Resp 18   Ht 4' (1.219 m)   Wt 81.8 kg   SpO2 94%   BMI 55.03 kg/m   General: Sitting up in stretcher in the hallway of the ER.  His daughter is at the bedside.  Patient is very tired appearing, slightly disheveled in appearance.  However not in acute distress.  Wearing nasal cannula oxygen. Eyes: EOMI, clear conjuctivae, white sclerea Neck: supple, no masses, trachea mildline  Cardiovascular: RRR, no murmurs or rubs, no peripheral edema  Respiratory: clear to auscultation bilaterally, no wheezes, no crackles  Abdomen: soft, nontender, nondistended, normal bowel tones heard  Skin: dry, no rashes  Musculoskeletal: no joint effusions, has some swelling in the bilateral upper extremities but not very  impressive.  Status post bilateral BKA. Psychiatric: appropriate affect, normal speech  Neurologic: extraocular muscles intact, speech is somewhat slow with a soft voice         Labs on Admission:  Basic Metabolic Panel: Recent Labs  Lab 03/03/2022 1330  NA 136  K 3.5  CL 96*  CO2 30  GLUCOSE 187*  BUN 8  CREATININE 0.75  CALCIUM 7.5*   Liver Function Tests: Recent Labs  Lab 02/19/2022 1330  AST 32  ALT 67*  ALKPHOS 72  BILITOT 1.0  PROT 6.9  ALBUMIN 3.0*   No results for input(s): "LIPASE", "AMYLASE" in the last 168 hours. Recent Labs  Lab 02/22/2022 1330  AMMONIA 48*   CBC: Recent Labs  Lab 02/28/2022 1330  WBC 10.6*  NEUTROABS 7.7  HGB 11.8*  HCT 39.0  MCV 102.9*  PLT 365   Cardiac Enzymes: No results for input(s): "CKTOTAL", "CKMB", "CKMBINDEX", "TROPONINI" in the last 168 hours.  BNP (last 3 results) No results for input(s): "BNP" in the last 8760 hours.  ProBNP (last 3 results) No results for input(s): "PROBNP" in the last 8760 hours.  CBG: No results for input(s): "GLUCAP" in the last 168 hours.  Radiological Exams on Admission: UE VENOUS DUPLEX (7am - 7pm)  Result Date: 02/24/2022 UPPER VENOUS STUDY  Patient Name:  Mark Russell  Date of Exam:   02/05/2022 Medical Rec #: [id number]      Accession #:    [id number] Date of Birth: Dec 04, 1935      Patient Gender: M Patient Age:   36 years Exam Location:  [facility] Procedure:      VAS [place] UPPER EXTREMITY VENOUS DUPLEX Referring Phys: Lurena Nida --------------------------------------------------------------------------------  Indications: Bilateral arm weakness, swelling/pain Comparison Study: No prior studies. Performing Technologist: Darlin Coco RDMS, RVT  Examination Guidelines: A complete evaluation includes B-mode imaging, spectral Doppler, color Doppler, and power Doppler as needed of all accessible portions of each vessel. Bilateral testing is considered an integral part of a complete  examination. Limited examinations for reoccurring indications may be performed as noted.  Right Findings: +----------+------------+---------+-----------+----------+-------+ RIGHT     CompressiblePhasicitySpontaneousPropertiesSummary +----------+------------+---------+-----------+----------+-------+ IJV           Full       Yes  Yes                      +----------+------------+---------+-----------+----------+-------+ Subclavian               Yes       Yes                      +----------+------------+---------+-----------+----------+-------+ Axillary      Full       Yes       Yes                      +----------+------------+---------+-----------+----------+-------+ Brachial      Full                                          +----------+------------+---------+-----------+----------+-------+ Radial        Full                                          +----------+------------+---------+-----------+----------+-------+ Ulnar         Full                                          +----------+------------+---------+-----------+----------+-------+ Cephalic      Full                                          +----------+------------+---------+-----------+----------+-------+ Basilic       Full                                          +----------+------------+---------+-----------+----------+-------+  Left Findings: +----------+------------+---------+-----------+----------+-------+ LEFT      CompressiblePhasicitySpontaneousPropertiesSummary +----------+------------+---------+-----------+----------+-------+ IJV           Full       Yes       Yes                      +----------+------------+---------+-----------+----------+-------+ Subclavian               Yes       Yes                      +----------+------------+---------+-----------+----------+-------+ Axillary      Full       Yes       Yes                       +----------+------------+---------+-----------+----------+-------+ Brachial      Full                                          +----------+------------+---------+-----------+----------+-------+ Radial        Full                                          +----------+------------+---------+-----------+----------+-------+  Ulnar         Full                                          +----------+------------+---------+-----------+----------+-------+ Cephalic      Full                                          +----------+------------+---------+-----------+----------+-------+ Basilic       Full                                          +----------+------------+---------+-----------+----------+-------+  Summary:  Right: No evidence of deep vein thrombosis in the upper extremity. No evidence of superficial vein thrombosis in the upper extremity.  Left: No evidence of deep vein thrombosis in the upper extremity. No evidence of superficial vein thrombosis in the upper extremity.  *See table(s) above for measurements and observations.    Preliminary    CT Head Wo Contrast  Result Date: 02/14/2022 CLINICAL DATA:  Mental status change, unknown cause altered. EXAM: CT HEAD WITHOUT CONTRAST TECHNIQUE: Contiguous axial images were obtained from the base of the skull through the vertex without intravenous contrast. RADIATION DOSE REDUCTION: This exam was performed according to the departmental dose-optimization program which includes automated exposure control, adjustment of the mA and/or kV according to patient size and/or use of iterative reconstruction technique. COMPARISON:  Head CT 09/09/2020 FINDINGS: Brain: There is no evidence of an acute infarct, intracranial hemorrhage, midline shift, or extra-axial fluid collection. A small chronic left frontoparietal infarct at the medial aspect of the central sulcus, a chronic infarct involving the left caudate head and periventricular white matter,  and a small chronic left cerebellar infarct are unchanged. There is mild-to-moderate cerebral atrophy. A 2 x 0.5 cm calcified extra-axial lesion over the left lateral frontal convexity is unchanged and compatible with a meningioma, without associated mass effect or edema. Vascular: Calcified atherosclerosis at the skull base. No hyperdense vessel. Skull: No acute fracture or suspicious osseous lesion. Sinuses/Orbits: The visualized paranasal sinuses and mastoid air cells are clear. Bilateral cataract extraction. Other: None. IMPRESSION: 1. No evidence of acute intracranial abnormality. 2. Chronic infarcts as above. Electronically Signed   By: Logan Bores M.D.   On: 02/13/2022 15:22   DG Chest 2 View  Result Date: 02/16/2022 CLINICAL DATA:  Shortness of breath, confusion. Recent diagnosis of lung cancer. EXAM: CHEST - 2 VIEW COMPARISON:  Chest radiograph 01/11/2022, PET-CT 01/29/2022 FINDINGS: The heart is enlarged, unchanged. The upper mediastinal contours are stable. There is new patchy opacity in the right lower lobe with a small right pleural effusion. There is vascular congestion and mild pulmonary interstitial edema, worsened in the interim. Nodular opacity is again seen in the left upper lobe better seen on prior CT. There is no appreciable pneumothorax. The pathologic fracture of the left clavicle is again noted common better seen on the prior PET-CT. IMPRESSION: 1. New patchy opacities in the right lower lobe may reflect infection in the correct clinical setting. 2. Vascular congestion with mild pulmonary interstitial edema and small right pleural effusion, new since the prior radiograph. Electronically Signed   By: Valetta Mole M.D.   On: 02/26/2022 13:58  Assessment/Plan Principal Problem: Community-acquired pneumonia-weakness in the setting of known untreated lung cancer.  Patient's worsening could be due to advancement of his cancer, this could explain the bilateral arm swelling.  Final report  is pending, but seems bilateral upper extremity Doppler has ruled out DVT.  He also presents with community-acquired pneumonia. -Observation admission -Continue supplemental oxygen -Treat community-acquired pneumonia with empiric IV azithromycin and Rocephin -Discussed with patient's daughter that we will limit further diagnostic workup, as the patient's ultimate goal is to be kept comfortable. Active Problems:   Diabetes type 2 with atherosclerosis of arteries of extremities (HCC) -tabetic diet when eating, with sliding scale insulin   Hypoxia   PVD (peripheral vascular disease) ([place]) -continue Coumadin, per pharmacy consult   S/P AKA (above knee amputation) bilateral (HCC)   COPD (chronic obstructive pulmonary disease) (HCC)   PAF (paroxysmal atrial fibrillation) ([place])   Hospice care -patient may improve his strength, with treatment of his pneumonia and be able to return home.  He may require inpatient hospice admission, pending clinical course.  DVT prophylaxis: Coumadin   Code Status: DNR,    Family Communication: Plan of care discussed in detail with the patient's daughter at the bedside in the ER at the time of admission.  All questions answered to her satisfaction.  Disposition Plan: Pending clinical course, either home with hospice with family, or he may require inpatient hospice.  Consults called: Was seen by his home hospice agency in the ER.  Admission status: Observation  Time spent: 53 minutes  Elesia Pemberton Neva Seat MD [facility] Pager [phone]  If 7PM-7AM, please contact night-coverage [email or web address] Password [facility]  02/07/2022, 5:46 PM

## 2022-02-23 NOTE — ED Triage Notes (Signed)
Coming from home. Family reports over the weekend he did not have his baseline 4L [place] d/t tank being low. Hospice nurse saw him today and reports he is increasingly confused. Pt reports chronic arm swelling and pain. Recently dx with lung ca, has not started treatment. Pt refused nebulizer for medics

## 2022-02-23 NOTE — ED Notes (Signed)
ED Provider at bedside. 

## 2022-02-23 NOTE — Progress Notes (Signed)
Upper extremity venous bilateral study completed.  Preliminary results relayed to [place], [place].   See CV Proc for preliminary results report.   Darlin Coco, RDMS, RVT

## 2022-02-23 NOTE — ED Provider Notes (Cosign Needed Addendum)
[place] EMERGENCY DEPARTMENT AT [facility] Provider Note   CSN: [id number] Arrival date & time: 02/08/2022  1216     History  Chief Complaint  Patient presents with   Arm Pain   Shortness of Breath   *Daughter was present to provide history  Mark Russell is a 87 y.o. male status post bilateral AKA, type 2 diabetes, vascular disease, on warfarin presented with bilateral arm swelling that began this morning.  Patient is on hospice for lung cancer.  Daughter stated patient has been acting different and noted on Sunday he was not receiving oxygen and his O2 sats dropped to the 50s.  Patient has received multiple nebulizer treatments.  Daughter stated that the past few days patient has received half his normal warfarin dose as his INR was elevated and he was supposed to resume his normal warfarin dose today.  Patient denied chest pain, nausea/vomiting, new onset weakness  Home Medications Prior to Admission medications   Medication Sig Start Date End Date Taking? Authorizing Provider  acetaminophen (TYLENOL) 325 MG tablet Take 650 mg by mouth every 6 (six) hours as needed for mild pain, fever or headache.   Yes [provider]  albuterol (VENTOLIN HFA) 108 (90 Base) MCG/ACT inhaler Inhale 2 puffs into the lungs every 6 (six) hours as needed for wheezing or shortness of breath. 06/10/20  Yes Shelly Coss, MD  amLODipine (NORVASC) 10 MG tablet Take 1 tablet (10 mg total) by mouth daily. 09/14/20  Yes Elgergawy, Silver Huguenin, MD  Baclofen 5 MG TABS Take 5 mg by mouth 3 (three) times daily.   Yes [provider]  bismuth subsalicylate (PEPTO BISMOL) 262 MG/15ML suspension Take 30 mLs by mouth every 6 (six) hours as needed for diarrhea or loose stools.   Yes [provider]  donepezil (ARICEPT) 10 MG tablet Take 10 mg by mouth at bedtime. 04/08/20  Yes [provider]  DULoxetine (CYMBALTA) 30 MG capsule Take 30 mg by mouth daily.   Yes [provider]  fentaNYL (DURAGESIC) 25 MCG/HR Place 1 patch onto the skin every 3 (three) days.   Yes [provider]  gabapentin (NEURONTIN) 300 MG capsule Take 900 mg by mouth 3 (three) times daily. 06/07/15  Yes [provider]  glimepiride (AMARYL) 4 MG tablet Take 4 mg by mouth daily with breakfast.   Yes [provider]  guaiFENesin-dextromethorphan (ROBITUSSIN DM) 100-10 MG/5ML syrup Take 10 mLs by mouth every 4 (four) hours as needed for cough. 06/10/20  Yes Shelly Coss, MD  hydrOXYzine (VISTARIL) 25 MG capsule Take 25-50 mg by mouth at bedtime as needed for anxiety.   Yes [provider]  ipratropium-albuterol (DUONEB) 0.5-2.5 (3) MG/3ML SOLN Take 3 mLs by nebulization every 4 (four) hours as needed (shortness of breath). 02/13/22  Yes [provider]  mineral oil-hydrophilic petrolatum (AQUAPHOR) ointment Apply 1 application topically daily as needed (bed sores).   Yes [provider]  oxyCODONE-acetaminophen (PERCOCET/ROXICET) 5-325 MG tablet Take 1 tablet by mouth every 6 (six) hours as needed for severe pain. Patient taking differently: Take 1-2 tablets by mouth every 4 (four) hours as needed for severe pain. 01/11/22  Yes Rex Kras, PA  senna (SENOKOT) 8.6 MG TABS tablet Take 1-2 tablets by mouth daily as needed for mild constipation.   Yes [provider]  simvastatin (ZOCOR) 40 MG tablet Take 40 mg by mouth every evening.   Yes [provider]  SYMBICORT 160-4.5 MCG/ACT inhaler Inhale  2 puffs into the lungs 2 (two) times daily as needed for wheezing or shortness of breath. 05/24/20  Yes [provider]  tamsulosin (FLOMAX) 0.4 MG CAPS capsule Take 0.4 mg by mouth daily. 05/24/20  Yes [provider]  vitamin B-12 (CYANOCOBALAMIN) 500 MCG tablet Take 1 tablet (500 mcg total) by mouth daily. Patient taking differently: Take 5,000 mcg by mouth daily. 09/13/20  Yes Elgergawy, Silver Huguenin, MD  warfarin (COUMADIN)  4 MG tablet Take 2-4 mg by mouth See admin instructions. Taking 4 mg daily    except on Sunday taking 1/2 tab = 2 mg   Yes [provider]  potassium chloride (KLOR-CON) 10 MEQ tablet Take 10 mEq by mouth 3 (three) times daily. X 30 days Patient not taking: Reported on 02/22/2022    [provider]      Allergies    Ace inhibitors, Dilaudid [hydromorphone hcl], Metformin and related, Morphine and related, and Pletal [cilostazol]    Review of Systems   Review of Systems  Respiratory:  Positive for shortness of breath.   See HPI  Physical Exam Updated Vital Signs BP (!) 178/95 (BP Location: Left Wrist)   Pulse 83   Temp 98.9 F (37.2 C) (Oral)   Resp 16   Ht 4' (1.219 m)   Wt 81.8 kg   SpO2 95%   BMI 55.03 kg/m  Physical Exam Constitutional:      General: He is not in acute distress. Cardiovascular:     Rate and Rhythm: Normal rate and regular rhythm.     Comments: 2+ bilateral radial pulses with regular rate Pulmonary:     Effort: No respiratory distress.     Breath sounds: Rhonchi present.     Comments: On nasal cannula Musculoskeletal:     Comments: Decreased range of motion in both arms   Skin:    Capillary Refill: Capillary refill takes less than 2 seconds.     Coloration: Skin is not cyanotic or pale.     Findings: No erythema.     Comments: Darkened skin on both arms  Neurological:     General: No focal deficit present.     Mental Status: He is alert and oriented to person, place, and time.     Comments: Sensation intact in both arms     ED Results / Procedures / Treatments   Labs (all labs ordered are listed, but only abnormal results are displayed) Labs Reviewed  COMPREHENSIVE METABOLIC PANEL - Abnormal; Notable for the following components:      Result Value   Chloride 96 (*)    Glucose, Bld 187 (*)    Calcium 7.5 (*)    Albumin 3.0 (*)    ALT 67 (*)    All other components within normal limits  CBC WITH DIFFERENTIAL/PLATELET -  Abnormal; Notable for the following components:   WBC 10.6 (*)    RBC 3.79 (*)    Hemoglobin 11.8 (*)    MCV 102.9 (*)    RDW 17.1 (*)    Lymphs Abs 0.4 (*)    Monocytes Absolute 2.1 (*)    Abs Immature Granulocytes 0.08 (*)    All other components within normal limits  PROTIME-INR - Abnormal; Notable for the following components:   Prothrombin Time 21.7 (*)    INR 1.9 (*)    All other components within normal limits  BLOOD GAS, VENOUS - Abnormal; Notable for the following components:   pH, Ven 7.44 (*)  pO2, Ven 58 (*)    Bicarbonate 36.7 (*)    Acid-Base Excess 10.5 (*)    All other components within normal limits  AMMONIA - Abnormal; Notable for the following components:   Ammonia 48 (*)    All other components within normal limits  TROPONIN I (HIGH SENSITIVITY) - Abnormal; Notable for the following components:   Troponin I (High Sensitivity) 114 (*)    All other components within normal limits  TROPONIN I (HIGH SENSITIVITY)    EKG None  Radiology CT Head Wo Contrast  Result Date: 02/18/2022 CLINICAL DATA:  Mental status change, unknown cause altered. EXAM: CT HEAD WITHOUT CONTRAST TECHNIQUE: Contiguous axial images were obtained from the base of the skull through the vertex without intravenous contrast. RADIATION DOSE REDUCTION: This exam was performed according to the departmental dose-optimization program which includes automated exposure control, adjustment of the mA and/or kV according to patient size and/or use of iterative reconstruction technique. COMPARISON:  Head CT 09/09/2020 FINDINGS: Brain: There is no evidence of an acute infarct, intracranial hemorrhage, midline shift, or extra-axial fluid collection. A small chronic left frontoparietal infarct at the medial aspect of the central sulcus, a chronic infarct involving the left caudate head and periventricular white matter, and a small chronic left cerebellar infarct are unchanged. There is mild-to-moderate cerebral  atrophy. A 2 x 0.5 cm calcified extra-axial lesion over the left lateral frontal convexity is unchanged and compatible with a meningioma, without associated mass effect or edema. Vascular: Calcified atherosclerosis at the skull base. No hyperdense vessel. Skull: No acute fracture or suspicious osseous lesion. Sinuses/Orbits: The visualized paranasal sinuses and mastoid air cells are clear. Bilateral cataract extraction. Other: None. IMPRESSION: 1. No evidence of acute intracranial abnormality. 2. Chronic infarcts as above. Electronically Signed   By: Logan Bores M.D.   On: 02/17/2022 15:22   DG Chest 2 View  Result Date: 02/21/2022 CLINICAL DATA:  Shortness of breath, confusion. Recent diagnosis of lung cancer. EXAM: CHEST - 2 VIEW COMPARISON:  Chest radiograph 01/11/2022, PET-CT 01/29/2022 FINDINGS: The heart is enlarged, unchanged. The upper mediastinal contours are stable. There is new patchy opacity in the right lower lobe with a small right pleural effusion. There is vascular congestion and mild pulmonary interstitial edema, worsened in the interim. Nodular opacity is again seen in the left upper lobe better seen on prior CT. There is no appreciable pneumothorax. The pathologic fracture of the left clavicle is again noted common better seen on the prior PET-CT. IMPRESSION: 1. New patchy opacities in the right lower lobe may reflect infection in the correct clinical setting. 2. Vascular congestion with mild pulmonary interstitial edema and small right pleural effusion, new since the prior radiograph. Electronically Signed   By: Valetta Mole M.D.   On: 02/24/2022 13:58    Procedures .Critical Care  Performed by: Chuck Hint, PA-C Authorized by: Chuck Hint, PA-C   Critical care provider statement:    Critical care time (minutes):  40   Critical care time was exclusive of:  Separately billable procedures and treating other patients   Critical care was necessary to treat or prevent imminent  or life-threatening deterioration of the following conditions: metastatic lung cancer.   Critical care was time spent personally by me on the following activities:  Ordering and performing treatments and interventions, ordering and review of laboratory studies, ordering and review of radiographic studies, pulse oximetry, re-evaluation of patient's condition, review of old charts, obtaining history from patient or surrogate, examination  of patient, evaluation of patient's response to treatment, discussions with consultants and development of treatment plan with patient or surrogate   I assumed direction of critical care for this patient from another provider in my specialty: no       Medications Ordered in ED Medications  cefTRIAXone (ROCEPHIN) 1 g in sodium chloride 0.9 % 100 mL IVPB (has no administration in time range)  azithromycin (ZITHROMAX) 500 mg in sodium chloride 0.9 % 250 mL IVPB (has no administration in time range)    ED Course/ Medical Decision Making/ A&P                             Medical Decision Making Amount and/or Complexity of Data Reviewed Labs: ordered. Radiology: ordered.  Risk Decision regarding hospitalization.   Mark Russell 87 y.o. presented today for bilateral arm swelling and shortness of breath. Working DDx that I considered at this time includes, but not limited to, PE, lung cancer, COPD, vascular insufficiency, DVT.  Review of prior external notes: 02/10/2022 palliative care home visit  Unique Tests and My Interpretation:  Troponin: 114 CBC: Unremarkable CMP: Hypocalcemia 7.5 Ammonia: 45 PT/INR: 12.7/1.9 VBG: pH 7.44, pO2 58, Bicarb 36.7, Acid-Base excess 10.5 CT head without contrast: No acute changes Bilateral venous ultrasound:  Discussion with Independent Historian: Daughter  Discussion of Management of Tests: none  Risk:  High:  - hospitalization or escalation of hospital-level care  Risk Stratification Score: none  Staffed with  Dorie Rank, MD  R/o DDx: Pending  Plan: Patient presented for bilateral arm swelling with shortness of breath.  On exam patient edematous bilateral arms however he was able to range arms slightly.  I spoke with patient and the daughter about goals of care and they stated they wanted to know why the arms were swelling.  Patient had elevated troponin however this is more likely due to demand ischemia from not having oxygen yesterday as opposed to cardiac as patient has not had any chest pain.   Venous ultrasound of bilateral arms was ordered to evaluate for any DVTs due to swelling.  After speaking with daughter she feels more comfortable take care of the patient inpatient and so hospitalist will be consulted for inpatient palliative care along with antibiotics as patient's chest x-ray showed possible pneumonia.  Patient is reasonable at this time for admission.  Patient was signed out to oncoming team at 1500.         Final Clinical Impression(s) / ED Diagnoses Final diagnoses:  None    Rx / DC Orders ED Discharge Orders     None         Elvina Sidle 03/04/2022 1558    Chuck Hint, PA-C 02/13/2022 1629    Dorie Rank, MD 02/24/22 [phone]

## 2022-02-23 NOTE — ED Notes (Signed)
Patient's son called this Therapist, sports. States that over the weekend patient has been increasingly weak in his arms which he uses to move from bed and wheelchair. States that baseline, patient usually knows where he is but yesterday started asking him where they are.

## 2022-02-23 NOTE — Progress Notes (Signed)
ANTICOAGULATION CONSULT NOTE - Initial Consult  Pharmacy Consult for Warfarin Indication: paroxysmal atrial fibrillation, hx of DVT, PVD  Allergies  Allergen Reactions   Ace Inhibitors     hypotension   Dilaudid [Hydromorphone Hcl]     "Mean"   Metformin And Related     GI upset   Morphine And Related Other (See Comments)    "mean"   Pletal [Cilostazol]     SOB    Patient Measurements: Height: 4' (121.9 cm) Weight: 81.8 kg (180 lb 5.4 oz) IBW/kg (Calculated) : 22.4   Vital Signs: Temp: 98.1 F (36.7 C) (02/19 1602) Temp Source: Oral (02/19 1226) BP: 154/86 (02/19 1602) Pulse Rate: 83 (02/19 1602)  Labs: Recent Labs    02/16/2022 1330 02/25/2022 1541  HGB 11.8*  --   HCT 39.0  --   PLT 365  --   LABPROT 21.7*  --   INR 1.9*  --   CREATININE 0.75  --   TROPONINIHS 114* 116*    Estimated Creatinine Clearance: 44.9 mL/min (by C-G formula based on SCr of 0.75 mg/dL).   Medical History: Past Medical History:  Diagnosis Date   Anxiety    Avascular necrosis (HCC)    BPH (benign prostatic hyperplasia)    COPD (chronic obstructive pulmonary disease) (HCC)    Depression    Diabetes mellitus    DVT (deep venous thrombosis) (HCC)    GERD (gastroesophageal reflux disease)    History of leg amputation (HCC)    both legs   Hypertension    Neuropathy    Peripheral vascular disease (HCC)    Sleep apnea     Medications:  Spoke to patient's daughters and son. Patient's usual warfarin regimen is 33m daily except 238mon Sundays. However, INR check on 2/15 was supratherapeutic at 4, and patient was instructed to take 40m38mn 2/15-2/18 and then resume usual dosing today. Last dose (40mg440maken 2/18 at 1800.   Assessment: 86 y55M with PMH of recent diagnosis of lung cancer, PAF, DVT, PVD s/p bilateral BKA on chronic warfarin therapy presenting with arm swelling and weakness. Bilateral upper extremity Doppler negative for DVT. Patient found to have CAP and started on  Ceftriaxone/Azithromycin. Pharmacy consulted for warfarin dosing while patient admitted.   INR = 1.9, slightly subtherapeutic CBC: Hgb 11.8, Plt WNL Note that broad spectrum antibiotics can increase INR  Goal of Therapy:  INR 2-3 Monitor platelets by anticoagulation protocol: Yes   Plan:  Warfarin 4mg 440mx 1 today Daily PT/INR Monitor CBC and for s/sx of bleeding   JignaLindell SparrmD, BCPS Clinical Pharmacist 02/11/2022,6:06 PM

## 2022-02-23 NOTE — ED Notes (Signed)
Pt's daughter Juliann Pulse at bedside.

## 2022-02-23 NOTE — Progress Notes (Signed)
Manufacturing engineer [facility]) Hospital Liaison Note   This is a current [facility] hospice patient. Visited patient at bedside. He was going to get an x-ray at the time of my visit. Spoke with is daughter at bedside.   Please call with any hospice questions or concerns. Thank you.   Zigmund Gottron, RN [facility] Liaison [phone]

## 2022-02-23 NOTE — ED Provider Notes (Cosign Needed Addendum)
Hospice metatstic lung ca Hx BL AKA.  He has increasing Upper extremity swelling, new pneumonia, troponin is up.  He is not on chemotherapy and is currently on hospice care.  He has had increased confusion.  Patient's family is having difficulty managing his symptoms at home.  Consult for inpatient admission for IV antibiotics for new pneumonia potential transition to inpatient hospice care   Patient on 02, no increased requirement   Suspect SVC syndrome. Case discussed with Dr. Renaee Munda who will admit the patient   Ned Grace 02/19/2022 2031    Dorie Rank, MD 02/24/22 [phone]

## 2022-02-23 NOTE — ED Notes (Signed)
ED TO INPATIENT HANDOFF REPORT  Name/Age/Gender Mark Russell 87 y.o. male  Code Status    Code Status Orders  (From admission, onward)           Start     Ordered   02/28/2022 1744  Do not attempt resuscitation (DNR)  Continuous       Question Answer Comment  If patient has no pulse and is not breathing Do Not Attempt Resuscitation   If patient has a pulse and/or is breathing: Medical Treatment Goals MEDICAL INTERVENTIONS DESIRED: Use advanced airway interventions, mechanical ventilation or cardioversion in appropriate circumstances; Use medication/IV fluids as indicated; Provide comfort medications; Transfer to Progressive/Stepdown/ICU as indicated.   Consent: Discussion documented in EHR or advanced directives reviewed      02/27/2022 1745           Code Status History     Date Active Date Inactive Code Status Order ID Comments User Context   09/09/2020 1509 09/13/2020 2215 Full Code [id number]  Orma Flaming, MD ED   06/14/2020 1847 06/20/2020 1831 Full Code [id number]  Jonnie Finner, DO Inpatient   06/06/2020 1124 06/10/2020 1931 Full Code [id number]  Harold Hedge, MD ED   03/29/2012 0131 03/30/2012 1650 Full Code [id number]  Janell Quiet, MD Inpatient   08/09/2011 1432 08/15/2011 1639 DNR [id number]  Rush Farmer, MD Inpatient   08/09/2011 1358 08/09/2011 1432 Partial Code [id number]  Rush Farmer, MD Inpatient   08/06/2011 1517 08/09/2011 1358 DNR [id number]  Domenic Polite, MD ED      Advance Directive Documentation    Flowsheet Row Most Recent Value  Type of Advance Directive Healthcare Power of Attorney  Pre-existing out of facility DNR order (yellow form or pink MOST form) --  "MOST" Form in Place? --       Home/SNF/Other Home  Chief Complaint Weakness [R53.1]  Level of Care/Admitting Diagnosis ED Disposition     ED Disposition  Admit   Condition  --   [place]: [facility] [[id number]]  Level of Care: Med-Surg [16]  May place patient  in observation at [facility] or [facility] if equivalent level of care is available:: No  Covid Evaluation: Asymptomatic - no recent exposure (last 10 days) testing not required  Diagnosis: Weakness [241835]  Admitting Physician: Lucillie Garfinkel [id number]  Attending Physician: Shaughnessy.[place], MIR Kari.Conine [id number]          Medical History Past Medical History:  Diagnosis Date   Anxiety    Avascular necrosis ([place])    BPH (benign prostatic hyperplasia)    COPD (chronic obstructive pulmonary disease) ([place])    Depression    Diabetes mellitus    DVT (deep venous thrombosis) (HCC)    GERD (gastroesophageal reflux disease)    History of leg amputation (HCC)    both legs   Hypertension    Neuropathy    Peripheral vascular disease (HCC)    Sleep apnea     Allergies Allergies  Allergen Reactions   Ace Inhibitors     hypotension   Dilaudid [Hydromorphone Hcl]     "Mean"   Metformin And Related     GI upset   Morphine And Related Other (See Comments)    "mean"   Pletal [Cilostazol]     SOB    IV Location/Drains/Wounds Patient Lines/Drains/Airways Status     Active Line/Drains/Airways     Name Placement date Placement time Site Days   Peripheral IV 02/11/2022 20 G  Left;Posterior Forearm 02/25/2022  1314  Forearm  less than 1   Wound --  --  --  --   Wound / Incision (Open or Dehisced) 06/17/20 Non-pressure wound Ear Posterior;Right infected skin lesion 06/17/20  0800  Ear  616   Wound / Incision (Open or Dehisced) 06/17/20 Shoulder Posterior;Right 06/17/20  0800  Shoulder  616   Wound / Incision (Open or Dehisced) 09/09/20 Puncture Shoulder Left 09/09/20  2000  Shoulder  532            Labs/Imaging Results for orders placed or performed during the hospital encounter of 02/27/2022 (from the past 48 hour(s))  Comprehensive metabolic panel     Status: Abnormal   Collection Time: 02/24/2022  1:30 PM  Result Value Ref Range   Sodium 136 135 - 145 mmol/L   Potassium 3.5 3.5 - 5.1  mmol/L   Chloride 96 (L) 98 - 111 mmol/L   CO2 30 22 - 32 mmol/L   Glucose, Bld 187 (H) 70 - 99 mg/dL    Comment: Glucose reference range applies only to samples taken after fasting for at least 8 hours.   BUN 8 8 - 23 mg/dL   Creatinine, Ser 0.75 0.61 - 1.24 mg/dL   Calcium 7.5 (L) 8.9 - 10.3 mg/dL   Total Protein 6.9 6.5 - 8.1 g/dL   Albumin 3.0 (L) 3.5 - 5.0 g/dL   AST 32 15 - 41 U/L   ALT 67 (H) 0 - 44 U/L   Alkaline Phosphatase 72 38 - 126 U/L   Total Bilirubin 1.0 0.3 - 1.2 mg/dL   GFR, Estimated >60 >60 mL/min    Comment: (NOTE) Calculated using the CKD-EPI Creatinine Equation (2021)    Anion gap 10 5 - 15    Comment: Performed at [facility], [place] [address] [place], [place] [address]  CBC with Differential     Status: Abnormal   Collection Time: 02/25/2022  1:30 PM  Result Value Ref Range   WBC 10.6 (H) 4.0 - 10.5 K/uL   RBC 3.79 (L) 4.22 - 5.81 MIL/uL   Hemoglobin 11.8 (L) 13.0 - 17.0 g/dL   HCT 39.0 39.0 - 52.0 %   MCV 102.9 (H) 80.0 - 100.0 fL   MCH 31.1 26.0 - 34.0 pg   MCHC 30.3 30.0 - 36.0 g/dL   RDW 17.1 (H) 11.5 - 15.5 %   Platelets 365 150 - 400 K/uL   nRBC 0.0 0.0 - 0.2 %   Neutrophils Relative % 73 %   Neutro Abs 7.7 1.7 - 7.7 K/uL   Lymphocytes Relative 4 %   Lymphs Abs 0.4 (L) 0.7 - 4.0 K/uL   Monocytes Relative 20 %   Monocytes Absolute 2.1 (H) 0.1 - 1.0 K/uL   Eosinophils Relative 1 %   Eosinophils Absolute 0.1 0.0 - 0.5 K/uL   Basophils Relative 1 %   Basophils Absolute 0.1 0.0 - 0.1 K/uL   Immature Granulocytes 1 %   Abs Immature Granulocytes 0.08 (H) 0.00 - 0.07 K/uL    Comment: Performed at [facility], [place] [address] [place], [place] [address]  Troponin I (High Sensitivity)     Status: Abnormal   Collection Time: 02/05/2022  1:30 PM  Result Value Ref Range   Troponin I (High Sensitivity) 114 (HH) <18 ng/L    Comment: CRITICAL RESULT CALLED TO, READ BACK BY AND VERIFIED WITH TRUOMG,I. RN AT [id number]  02/05/2022 MULLINS,T (NOTE) Elevated high sensitivity troponin  I (hsTnI) values and significant  changes across serial measurements may suggest ACS but many other  chronic and acute conditions are known to elevate hsTnI results.  Refer to the "Links" section for chest pain algorithms and additional  guidance. Performed at [facility], [place] [address] [place], [place] [address]   Protime-INR     Status: Abnormal   Collection Time: 02/08/2022  1:30 PM  Result Value Ref Range   Prothrombin Time 21.7 (H) 11.4 - 15.2 seconds   INR 1.9 (H) 0.8 - 1.2    Comment: (NOTE) INR goal varies based on device and disease states. Performed at [facility], [place] [address], [place], [place] [address]   Blood gas, venous     Status: Abnormal   Collection Time: 02/10/2022  1:30 PM  Result Value Ref Range   pH, Ven 7.44 (H) 7.25 - 7.43   pCO2, Ven 54 44 - 60 mmHg   pO2, Ven 58 (H) 32 - 45 mmHg   Bicarbonate 36.7 (H) 20.0 - 28.0 mmol/L   Acid-Base Excess 10.5 (H) 0.0 - 2.0 mmol/L   O2 Saturation 90.6 %   Patient temperature 37.0     Comment: Performed at [facility], [place] [address], [place], [place] [address]  Ammonia     Status: Abnormal   Collection Time: 02/22/2022  1:30 PM  Result Value Ref Range   Ammonia 48 (H) 9 - 35 umol/L    Comment: Performed at [facility], [place] [address] [place], [place] [address]  Troponin I (High Sensitivity)     Status: Abnormal   Collection Time: 02/22/2022  3:41 PM  Result Value Ref Range   Troponin I (High Sensitivity) 116 (HH) <18 ng/L    Comment: CRITICAL VALUE NOTED. VALUE IS CONSISTENT WITH PREVIOUSLY REPORTED/CALLED VALUE (NOTE) Elevated high sensitivity troponin I (hsTnI) values and significant  changes across serial measurements may suggest ACS but many other  chronic and acute conditions are known to elevate hsTnI results.  Refer to the "Links" section for chest pain algorithms and  additional  guidance. Performed at [facility], [place] [address], [place], [place] [address]    UE VENOUS DUPLEX (7am - 7pm)  Result Date: 02/08/2022 UPPER VENOUS STUDY  Patient Name:  Mark Russell  Date of Exam:   02/22/2022 Medical Rec #: [id number]      Accession #:    [id number] Date of Birth: 1935/06/04      Patient Gender: M Patient Age:   52 years Exam Location:  [facility] Procedure:      VAS [place] UPPER EXTREMITY VENOUS DUPLEX Referring Phys: Lurena Nida --------------------------------------------------------------------------------  Indications: Bilateral arm weakness, swelling/pain Comparison Study: No prior studies. Performing Technologist: Darlin Coco RDMS, RVT  Examination Guidelines: A complete evaluation includes B-mode imaging, spectral Doppler, color Doppler, and power Doppler as needed of all accessible portions of each vessel. Bilateral testing is considered an integral part of a complete examination. Limited examinations for reoccurring indications may be performed as noted.  Right Findings: +----------+------------+---------+-----------+----------+-------+ RIGHT     CompressiblePhasicitySpontaneousPropertiesSummary +----------+------------+---------+-----------+----------+-------+ IJV           Full       Yes       Yes                      +----------+------------+---------+-----------+----------+-------+ Subclavian               Yes  Yes                      +----------+------------+---------+-----------+----------+-------+ Axillary      Full       Yes       Yes                      +----------+------------+---------+-----------+----------+-------+ Brachial      Full                                          +----------+------------+---------+-----------+----------+-------+ Radial        Full                                          +----------+------------+---------+-----------+----------+-------+ Ulnar          Full                                          +----------+------------+---------+-----------+----------+-------+ Cephalic      Full                                          +----------+------------+---------+-----------+----------+-------+ Basilic       Full                                          +----------+------------+---------+-----------+----------+-------+  Left Findings: +----------+------------+---------+-----------+----------+-------+ LEFT      CompressiblePhasicitySpontaneousPropertiesSummary +----------+------------+---------+-----------+----------+-------+ IJV           Full       Yes       Yes                      +----------+------------+---------+-----------+----------+-------+ Subclavian               Yes       Yes                      +----------+------------+---------+-----------+----------+-------+ Axillary      Full       Yes       Yes                      +----------+------------+---------+-----------+----------+-------+ Brachial      Full                                          +----------+------------+---------+-----------+----------+-------+ Radial        Full                                          +----------+------------+---------+-----------+----------+-------+ Ulnar         Full                                          +----------+------------+---------+-----------+----------+-------+  Cephalic      Full                                          +----------+------------+---------+-----------+----------+-------+ Basilic       Full                                          +----------+------------+---------+-----------+----------+-------+  Summary:  Right: No evidence of deep vein thrombosis in the upper extremity. No evidence of superficial vein thrombosis in the upper extremity.  Left: No evidence of deep vein thrombosis in the upper extremity. No evidence of superficial vein thrombosis in the upper  extremity.  *See table(s) above for measurements and observations.    Preliminary    CT Head Wo Contrast  Result Date: 03/04/2022 CLINICAL DATA:  Mental status change, unknown cause altered. EXAM: CT HEAD WITHOUT CONTRAST TECHNIQUE: Contiguous axial images were obtained from the base of the skull through the vertex without intravenous contrast. RADIATION DOSE REDUCTION: This exam was performed according to the departmental dose-optimization program which includes automated exposure control, adjustment of the mA and/or kV according to patient size and/or use of iterative reconstruction technique. COMPARISON:  Head CT 09/09/2020 FINDINGS: Brain: There is no evidence of an acute infarct, intracranial hemorrhage, midline shift, or extra-axial fluid collection. A small chronic left frontoparietal infarct at the medial aspect of the central sulcus, a chronic infarct involving the left caudate head and periventricular white matter, and a small chronic left cerebellar infarct are unchanged. There is mild-to-moderate cerebral atrophy. A 2 x 0.5 cm calcified extra-axial lesion over the left lateral frontal convexity is unchanged and compatible with a meningioma, without associated mass effect or edema. Vascular: Calcified atherosclerosis at the skull base. No hyperdense vessel. Skull: No acute fracture or suspicious osseous lesion. Sinuses/Orbits: The visualized paranasal sinuses and mastoid air cells are clear. Bilateral cataract extraction. Other: None. IMPRESSION: 1. No evidence of acute intracranial abnormality. 2. Chronic infarcts as above. Electronically Signed   By: Logan Bores M.D.   On: 02/13/2022 15:22   DG Chest 2 View  Result Date: 02/05/2022 CLINICAL DATA:  Shortness of breath, confusion. Recent diagnosis of lung cancer. EXAM: CHEST - 2 VIEW COMPARISON:  Chest radiograph 01/11/2022, PET-CT 01/29/2022 FINDINGS: The heart is enlarged, unchanged. The upper mediastinal contours are stable. There is new  patchy opacity in the right lower lobe with a small right pleural effusion. There is vascular congestion and mild pulmonary interstitial edema, worsened in the interim. Nodular opacity is again seen in the left upper lobe better seen on prior CT. There is no appreciable pneumothorax. The pathologic fracture of the left clavicle is again noted common better seen on the prior PET-CT. IMPRESSION: 1. New patchy opacities in the right lower lobe may reflect infection in the correct clinical setting. 2. Vascular congestion with mild pulmonary interstitial edema and small right pleural effusion, new since the prior radiograph. Electronically Signed   By: Valetta Mole M.D.   On: 02/26/2022 13:58    Pending Labs Unresulted Labs (From admission, onward)     Start     Ordered   02/24/22 [id number]  Basic metabolic panel  Tomorrow morning,   R        02/12/2022 1745   02/24/22 0500  CBC  Tomorrow  morning,   R        02/12/2022 1745   02/24/22 0500  Protime-INR  Daily,   R      02/20/2022 1808   02/13/2022 1743  Hemoglobin A1c  (Glycemic Control (SSI)  Q 4 Hours / Glycemic Control (SSI)  AC +/- HS)  Once,   R       Comments: To assess prior glycemic control    02/10/2022 1745            Vitals/Pain Today's Vitals   03/02/2022 1226 02/24/2022 1233 03/04/2022 1602  BP: (!) 178/95  (!) 154/86  Pulse: 83  83  Resp: 16  18  Temp: 98.9 F (37.2 C)  98.1 F (36.7 C)  TempSrc: Oral    SpO2: 95%  94%  Weight:  81.8 kg   Height:  4' (1.219 m)   PainSc:  8      Isolation Precautions No active isolations  Medications Medications  fentaNYL (DURAGESIC) 25 MCG/HR 1 patch (has no administration in time range)  oxyCODONE-acetaminophen (PERCOCET/ROXICET) 5-325 MG per tablet 1-2 tablet (has no administration in time range)  amLODipine (NORVASC) tablet 10 mg (has no administration in time range)  donepezil (ARICEPT) tablet 10 mg (has no administration in time range)  DULoxetine (CYMBALTA) DR capsule 30 mg (has no  administration in time range)  glimepiride (AMARYL) tablet 4 mg (has no administration in time range)  tamsulosin (FLOMAX) capsule 0.4 mg (has no administration in time range)  baclofen (LIORESAL) tablet 5 mg (has no administration in time range)  gabapentin (NEURONTIN) capsule 900 mg (has no administration in time range)  ipratropium-albuterol (DUONEB) 0.5-2.5 (3) MG/3ML nebulizer solution 3 mL (has no administration in time range)  mometasone-formoterol (DULERA) 200-5 MCG/ACT inhaler 2 puff (has no administration in time range)  insulin aspart (novoLOG) injection 0-15 Units (has no administration in time range)  insulin aspart (novoLOG) injection 0-5 Units (has no administration in time range)  acetaminophen (TYLENOL) tablet 650 mg (has no administration in time range)    Or  acetaminophen (TYLENOL) suppository 650 mg (has no administration in time range)  traZODone (DESYREL) tablet 25 mg (has no administration in time range)  ondansetron (ZOFRAN) tablet 4 mg (has no administration in time range)    Or  ondansetron (ZOFRAN) injection 4 mg (has no administration in time range)  hydrOXYzine (ATARAX) tablet 25-50 mg (has no administration in time range)  azithromycin (ZITHROMAX) 500 mg in sodium chloride 0.9 % 250 mL IVPB (has no administration in time range)  cefTRIAXone (ROCEPHIN) 1 g in sodium chloride 0.9 % 100 mL IVPB (has no administration in time range)  warfarin (COUMADIN) tablet 4 mg (has no administration in time range)  Warfarin - Pharmacist Dosing Inpatient (has no administration in time range)  cefTRIAXone (ROCEPHIN) 1 g in sodium chloride 0.9 % 100 mL IVPB (0 g Intravenous Stopped 02/10/2022 1716)  azithromycin (ZITHROMAX) 500 mg in sodium chloride 0.9 % 250 mL IVPB (500 mg Intravenous New Bag/Given 02/24/2022 1730)    Mobility power wheelchair

## 2022-02-24 ENCOUNTER — Inpatient Hospital Stay (HOSPITAL_COMMUNITY)

## 2022-02-24 ENCOUNTER — Observation Stay (HOSPITAL_COMMUNITY)

## 2022-02-24 DIAGNOSIS — Z515 Encounter for palliative care: Secondary | ICD-10-CM | POA: Diagnosis not present

## 2022-02-24 DIAGNOSIS — N400 Enlarged prostate without lower urinary tract symptoms: Secondary | ICD-10-CM | POA: Diagnosis present

## 2022-02-24 DIAGNOSIS — Z8249 Family history of ischemic heart disease and other diseases of the circulatory system: Secondary | ICD-10-CM | POA: Diagnosis not present

## 2022-02-24 DIAGNOSIS — R791 Abnormal coagulation profile: Secondary | ICD-10-CM | POA: Diagnosis present

## 2022-02-24 DIAGNOSIS — Z89611 Acquired absence of right leg above knee: Secondary | ICD-10-CM | POA: Diagnosis not present

## 2022-02-24 DIAGNOSIS — I70209 Unspecified atherosclerosis of native arteries of extremities, unspecified extremity: Secondary | ICD-10-CM | POA: Diagnosis present

## 2022-02-24 DIAGNOSIS — J9621 Acute and chronic respiratory failure with hypoxia: Secondary | ICD-10-CM | POA: Diagnosis present

## 2022-02-24 DIAGNOSIS — I2489 Other forms of acute ischemic heart disease: Secondary | ICD-10-CM | POA: Diagnosis present

## 2022-02-24 DIAGNOSIS — I5021 Acute systolic (congestive) heart failure: Secondary | ICD-10-CM | POA: Diagnosis not present

## 2022-02-24 DIAGNOSIS — Z6841 Body Mass Index (BMI) 40.0 and over, adult: Secondary | ICD-10-CM | POA: Diagnosis not present

## 2022-02-24 DIAGNOSIS — R531 Weakness: Secondary | ICD-10-CM | POA: Diagnosis not present

## 2022-02-24 DIAGNOSIS — F32A Depression, unspecified: Secondary | ICD-10-CM | POA: Diagnosis present

## 2022-02-24 DIAGNOSIS — I110 Hypertensive heart disease with heart failure: Secondary | ICD-10-CM | POA: Diagnosis present

## 2022-02-24 DIAGNOSIS — Z89612 Acquired absence of left leg above knee: Secondary | ICD-10-CM | POA: Diagnosis not present

## 2022-02-24 DIAGNOSIS — C3490 Malignant neoplasm of unspecified part of unspecified bronchus or lung: Secondary | ICD-10-CM | POA: Diagnosis present

## 2022-02-24 DIAGNOSIS — Z66 Do not resuscitate: Secondary | ICD-10-CM | POA: Diagnosis present

## 2022-02-24 DIAGNOSIS — Z87891 Personal history of nicotine dependence: Secondary | ICD-10-CM | POA: Diagnosis not present

## 2022-02-24 DIAGNOSIS — R042 Hemoptysis: Secondary | ICD-10-CM | POA: Diagnosis not present

## 2022-02-24 DIAGNOSIS — M879 Osteonecrosis, unspecified: Secondary | ICD-10-CM | POA: Diagnosis present

## 2022-02-24 DIAGNOSIS — E1151 Type 2 diabetes mellitus with diabetic peripheral angiopathy without gangrene: Secondary | ICD-10-CM | POA: Diagnosis present

## 2022-02-24 DIAGNOSIS — J440 Chronic obstructive pulmonary disease with acute lower respiratory infection: Secondary | ICD-10-CM | POA: Diagnosis present

## 2022-02-24 DIAGNOSIS — J189 Pneumonia, unspecified organism: Secondary | ICD-10-CM | POA: Diagnosis present

## 2022-02-24 DIAGNOSIS — A419 Sepsis, unspecified organism: Secondary | ICD-10-CM | POA: Diagnosis not present

## 2022-02-24 DIAGNOSIS — I480 Paroxysmal atrial fibrillation: Secondary | ICD-10-CM | POA: Diagnosis present

## 2022-02-24 LAB — CBC
HCT: 37.3 % — ABNORMAL LOW (ref 39.0–52.0)
Hemoglobin: 11.6 g/dL — ABNORMAL LOW (ref 13.0–17.0)
MCH: 31.9 pg (ref 26.0–34.0)
MCHC: 31.1 g/dL (ref 30.0–36.0)
MCV: 102.5 fL — ABNORMAL HIGH (ref 80.0–100.0)
Platelets: 338 10*3/uL (ref 150–400)
RBC: 3.64 MIL/uL — ABNORMAL LOW (ref 4.22–5.81)
RDW: 16.8 % — ABNORMAL HIGH (ref 11.5–15.5)
WBC: 10.6 10*3/uL — ABNORMAL HIGH (ref 4.0–10.5)
nRBC: 0 % (ref 0.0–0.2)

## 2022-02-24 LAB — BASIC METABOLIC PANEL
Anion gap: 10 (ref 5–15)
BUN: 8 mg/dL (ref 8–23)
CO2: 30 mmol/L (ref 22–32)
Calcium: 8 mg/dL — ABNORMAL LOW (ref 8.9–10.3)
Chloride: 92 mmol/L — ABNORMAL LOW (ref 98–111)
Creatinine, Ser: 0.73 mg/dL (ref 0.61–1.24)
GFR, Estimated: >60 mL/min (ref >60)
Glucose, Bld: 152 mg/dL — ABNORMAL HIGH (ref 70–99)
Potassium: 4 mmol/L (ref 3.5–5.1)
Sodium: 132 mmol/L — ABNORMAL LOW (ref 135–145)

## 2022-02-24 LAB — ECHOCARDIOGRAM COMPLETE
AR max vel: 1.15 cm2
AV Area VTI: 1.2 cm2
AV Area mean vel: 1.14 cm2
AV Mean grad: 21 mmHg
AV Peak grad: 35.3 mmHg
Ao pk vel: 2.97 m/s
Area-P 1/2: 3.62 cm2
Calc EF: 49.4 %
Height: 48 in
MV VTI: 1.93 cm2
S' Lateral: 4.65 cm
Single Plane A2C EF: 46.8 %
Single Plane A4C EF: 52.6 %
Weight: 2885.38 oz

## 2022-02-24 LAB — GLUCOSE, CAPILLARY
Glucose-Capillary: 165 mg/dL — ABNORMAL HIGH (ref 70–99)
Glucose-Capillary: 143 mg/dL — ABNORMAL HIGH (ref 70–99)
Glucose-Capillary: 71 mg/dL (ref 70–99)
Glucose-Capillary: 120 mg/dL — ABNORMAL HIGH (ref 70–99)

## 2022-02-24 LAB — PROTIME-INR
INR: 1.8 — ABNORMAL HIGH (ref 0.8–1.2)
Prothrombin Time: 20.3 seconds — ABNORMAL HIGH (ref 11.4–15.2)

## 2022-02-24 LAB — BRAIN NATRIURETIC PEPTIDE: B Natriuretic Peptide: 1184.5 pg/mL — ABNORMAL HIGH (ref 0.0–100.0)

## 2022-02-24 MED ORDER — SODIUM CHLORIDE 0.9 % IV SOLN
2.0000 g | INTRAVENOUS | Status: DC
  Administered 2022-02-24 – 2022-02-26 (×3): 2 g via INTRAVENOUS
  Filled 2022-02-24 (×3): qty 20

## 2022-02-24 MED ORDER — LIP MEDEX EX OINT
TOPICAL_OINTMENT | CUTANEOUS | Status: DC | PRN
  Administered 2022-02-25: 75 via TOPICAL
  Filled 2022-02-24 (×2): qty 7

## 2022-02-24 MED ORDER — WARFARIN SODIUM 5 MG PO TABS
5.0000 mg | ORAL_TABLET | Freq: Once | ORAL | Status: AC
  Administered 2022-02-24: 5 mg via ORAL
  Filled 2022-02-24: qty 1

## 2022-02-24 MED ORDER — GUAIFENESIN ER 600 MG PO TB12
600.0000 mg | ORAL_TABLET | Freq: Two times a day (BID) | ORAL | Status: AC
  Administered 2022-02-24 – 2022-02-26 (×4): 600 mg via ORAL
  Filled 2022-02-24 (×4): qty 1

## 2022-02-24 MED ORDER — PERFLUTREN LIPID MICROSPHERE
1.0000 mL | INTRAVENOUS | Status: AC | PRN
  Administered 2022-02-24: 2 mL via INTRAVENOUS

## 2022-02-24 NOTE — Progress Notes (Signed)
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 Patient was admitted to our hospice services on 02/11/22. Patient was transported via EMS to the [facility] ED on 02/24/32 after complaints of increased weakness/swelling to the BUE for evaluation. An ACC RN was on scene to assess patient when family requested patient to be transported and evaluated at the hospital.  This a related admission per Dr. Tomasa Hosteller, an [facility] physician. Patient was admitted to the [facility] with weakness and is a DNR.   Checked in with bedside RN, who stated that patient has had complaints of pain with forgetfulness this shift which she has just medicated with prns for pain management. During visit,  Patient was awake visiting with daughter at side verbalizing discomfort to shoulder. Patient was able to share his needs, follow commands and state that he is feeling better today than this weekend. Patient is sitting up in bed with 4L O2 via [place] (which is his baseline) with a congested cough noted. Daughter stated that patient is moving his extremities better since yesterday and seems more like himself but still experiencing weakness and pain to shoulder. Also spoke to patient son via telephone after visit to support. Family is aware that [place] will continue to visit patient daily during this admission and encourage to call for support as needed.    Patient in appropriate for inpatient level of care due to the need for frequent assessment by skilled staff to manage BUE swelling, pain management and pneumonia care and management of IV antibiotics.    V/S:  temp 99.9, HR 79, RR 18, BP 160/74, Sats 93% on 4L O2 via [place] I&O: 100/300 (-200) Abnormal lab work: WBC 10.6, RBC 3.64, Hemo 11.6, HCT 37.3, Na 132, Chl 92, Cal 8.0, INR 1.8, Glucose 152 Diagnostics:  CT  head  - No acute intracranial abnormality. DG Chest - New patchy opacities in RLL reflect infection. Vascular congestion with sm right pleural effusion. IVs/PRNs: Zithromax 539m in NS 2593mIVPB q24hr, Rocephin 2g in NS 10080mVPB q24hrs, Tylenol 650m53mm on 2/20 at 1015, Duoneb 3ml 72m at 2/19 2116, Percocet 2 tablets adm 2/20 at 0825   MD EPIC Problem list:  Assessment/Plan Principal Problem: Community-acquired pneumonia-weakness in the setting of known untreated lung cancer.  Patient's worsening could be due to advancement of his cancer, this could explain the bilateral arm swelling.  Final report is pending, but seems bilateral upper extremity Doppler has ruled out DVT.  He also presents with community-acquired pneumonia. -Observation admission -Continue supplemental oxygen -Treat community-acquired pneumonia with empiric IV azithromycin and Rocephin -Discussed with patient's daughter that we will limit further diagnostic workup, as the patient's ultimate goal is to be kept comfortable. Active Problems:   Diabetes type 2 with atherosclerosis of arteries of extremities (HCC) -tabetic diet when eating, with sliding scale insulin   Hypoxia   PVD (peripheral vascular disease) (HCC) Antelopentinue Coumadin, per pharmacy consult   S/P AKA (above knee amputation) bilateral (HCC)   COPD (chronic obstructive pulmonary disease) (HCC)   PAF (paroxysmal atrial fibrillation) (HCC) [facility] -patient may improve his strength, with treatment of his pneumonia and be able to return home.  He may require inpatient hospice admission, pending clinical course.  DVT prophylaxis: Coumadin   Code Status: DNR,    Disposition Plan: Pending clinical  course, either home with hospice with family, or he may require inpatient hospice. Consults called: Was seen by his home hospice agency in the ER.    D/C planning- Ongoing assessment - Plan to discharge when medically stable with hospice back home or to [facility]. Please  use [facility] for all ACC patients's transportation needs.  Goals of Care: Clear - Patient is a DNR and family confirmed wishes to remain on hospice services upon discharge. Communication with IDT- [place] team updated on patient status. AOR made aware of hospitalization. ACC Transfer summary/Medication listed place on [facility] shadow chart.  Communication with [facility] - Spoke/supported patient family at bedside and via telephone.   Please call with any hospice related questions/concerns,   Gar Ponto, RN [place] (in [place]) [phone]

## 2022-02-24 NOTE — Progress Notes (Signed)
2D echo attempted, patient is with respiratory. Will try later

## 2022-02-24 NOTE — Progress Notes (Addendum)
PROGRESS NOTE  Mark Russell [id number] DOB: 1935-04-22 DOA: 02/11/2022 PCP: Collene Leyden, MD  HPI/Recap of past 24 hours: Mark Russell is a 87 y.o. male with medical history significant for peripheral vascular disease resulting in bilateral BKA, non-insulin-dependent type 2 diabetes, paroxysmal atrial fibrillation on Coumadin, recent diagnosis of lung cancer who declined treatment and lives with his son who is being admitted to the hospital with worsening weakness, bilateral arm swelling and inability to be cared for at home.  Patient has been on home hospice, history is provided by the patient's daughter who is also closely involved in his care.  She states that up until about 5 days ago, patient was still quite active, he gets around the home with his wheelchair, he is not confused, wears 2 L of oxygen at baseline.  Over the weekend, he started getting more and more weak.  On Friday, he was seen by hospice at home, they started him on low-dose of Xanax.  Also over the weekend, they discovered that his nasal cannula oxygen had malfunction, and there was a small piece of plastic blocking the flow of oxygen.  Patient got progressively weaker over the weekend, they also noticed that he developed worsening bilateral arm swelling.  This past Friday, family and also discussed with hospice and requested that he possibly be transition to inpatient hospice house, however they were told that he does not meet criteria.  In any case, due to the above issues, they brought him to the ER for evaluation.   ED Course: In the ER, patient has slightly elevated blood pressure, otherwise stable on 2 L nasal cannula oxygen.  Saturating well, not tachycardic.  Work reveals no leukocytosis 10.6, hemoglobin 11.8, calcium 7.5, elevated troponin 114, recheck 116.  INR 1.9.  02/24/2022: The patient was seen and examined at his bedside.  His daughter was present in the room.  Denies having any pain.  Denies difficulty breathing.   He has a productive cough.  On empiric antibiotics for community-acquired pneumonia.  Has declined treatment for lung cancer.  Poor prognosis.  Seen by hospice care this morning.  Assessment/Plan: Principal Problem:   Weakness Active Problems:   Diabetes type 2 with atherosclerosis of arteries of extremities (HCC)   Hypoxia   PVD (peripheral vascular disease) (HCC)   S/P AKA (above knee amputation) bilateral (HCC)   COPD (chronic obstructive pulmonary disease) (HCC)   PAF (paroxysmal atrial fibrillation) ([place])   Hospice care  Community-acquired pneumonia, POA- Continue Rocephin and IV azithromycin. Continue to maintain O2 saturation greater than 90%. Antitussives as needed.  Generalized weakness in the setting of known untreated lung cancer.   Patient's worsening could be due to advancement of his cancer, this could explain the bilateral arm swelling.   Bilateral upper extremity Doppler has ruled out DVT.   Elevated troponin Obtain twelve-lead EKG Follow 2D echo Continue to monitor on telemetry  HFrEF 35 to 40% seen on 2D echo done on 02/24/2022 Cardiology consulted to assist with the management Monitor on telemetry Per hospice care's note plan to discharge when medically stable  PVD status post bilateral AKA Resume home regimen On gabapentin for neuropathic pain  Paroxysmal A-fib on Coumadin INR 1.8, subtherapeutic Appreciate pharmacy for dosing.  COPD (chronic obstructive pulmonary disease) (HCC) Bronchodilators O2 supplementation  Goals of care Hospice care following. Poor prognosis   DVT prophylaxis: Coumadin   Code Status: DNR  Family Communication: Updated his daughter at bedside  Disposition Plan: Undetermined   Consultants: Palliative  care team  Procedures: None  Antimicrobials: Rocephin Azithromycin  DVT prophylaxis: Coumadin  Status is: Inpatient The patient requires at least 2 midnights for further evaluation and treatment of present  condition.    Objective: Vitals:   02/24/22 0801 02/24/22 0852 02/24/22 1326 02/24/22 1453  BP: (!) 201/70 (!) 160/74 (!) 151/97   Pulse: 79 79 84   Resp: 18 18 18   $ Temp: 98.9 F (37.2 C)  98.4 F (36.9 C)   TempSrc: Oral  Oral   SpO2:  93% 99% 92%  Weight:      Height:        Intake/Output Summary (Last 24 hours) at 02/24/2022 1826 Last data filed at 02/24/2022 1414 Gross per 24 hour  Intake 120 ml  Output 750 ml  Net -630 ml   Filed Weights   03/02/2022 1233  Weight: 81.8 kg    Exam:  General: 87 y.o. year-old male well developed well nourished in no acute distress.  Alert and oriented x3. Cardiovascular: Regular rate and rhythm with no rubs or gallops.  No thyromegaly or JVD noted.   Respiratory: Rales noted bilaterally, worse on the right.  Poor inspiratory effort.   Abdomen: Soft nontender nondistended with normal bowel sounds x4 quadrants. Musculoskeletal: No lower extremity edema.  Bilateral AKA.  Skin: No ulcerative lesions noted or rashes, Psychiatry: Mood is appropriate for condition and setting   Data Reviewed: CBC: Recent Labs  Lab 02/19/2022 1330 02/24/22 0743  WBC 10.6* 10.6*  NEUTROABS 7.7  --   HGB 11.8* 11.6*  HCT 39.0 37.3*  MCV 102.9* 102.5*  PLT 365 [id number]   Basic Metabolic Panel: Recent Labs  Lab 02/26/2022 1330 02/24/22 0743  NA 136 132*  K 3.5 4.0  CL 96* 92*  CO2 30 30  GLUCOSE 187* 152*  BUN 8 8  CREATININE 0.75 0.73  CALCIUM 7.5* 8.0*   GFR: Estimated Creatinine Clearance: 44.9 mL/min (by C-G formula based on SCr of 0.73 mg/dL). Liver Function Tests: Recent Labs  Lab 02/16/2022 1330  AST 32  ALT 67*  ALKPHOS 72  BILITOT 1.0  PROT 6.9  ALBUMIN 3.0*   No results for input(s): "LIPASE", "AMYLASE" in the last 168 hours. Recent Labs  Lab 02/15/2022 1330  AMMONIA 48*   Coagulation Profile: Recent Labs  Lab 03/04/2022 1330 02/24/22 0743  INR 1.9* 1.8*   Cardiac Enzymes: No results for input(s): "CKTOTAL", "CKMB",  "CKMBINDEX", "TROPONINI" in the last 168 hours. BNP (last 3 results) No results for input(s): "PROBNP" in the last 8760 hours. HbA1C: Recent Labs    02/15/2022 1330  HGBA1C 6.0*   CBG: Recent Labs  Lab 02/28/2022 2122 02/24/22 0802 02/24/22 1143 02/24/22 1554  GLUCAP 177* 165* 143* 71   Lipid Profile: No results for input(s): "CHOL", "HDL", "LDLCALC", "TRIG", "CHOLHDL", "LDLDIRECT" in the last 72 hours. Thyroid Function Tests: No results for input(s): "TSH", "T4TOTAL", "FREET4", "T3FREE", "THYROIDAB" in the last 72 hours. Anemia Panel: No results for input(s): "VITAMINB12", "FOLATE", "FERRITIN", "TIBC", "IRON", "RETICCTPCT" in the last 72 hours. Urine analysis:    Component Value Date/Time   COLORURINE YELLOW 10/02/2020 1100   APPEARANCEUR CLEAR 10/02/2020 1100   LABSPEC 1.030 10/02/2020 1100   PHURINE 6.0 10/02/2020 1100   GLUCOSEU NEGATIVE 10/02/2020 1100   HGBUR NEGATIVE 10/02/2020 1100   BILIRUBINUR NEGATIVE 10/02/2020 1100   KETONESUR 5 (A) 10/02/2020 1100   PROTEINUR 100 (A) 10/02/2020 1100   UROBILINOGEN 1.0 08/06/2011 1157   NITRITE NEGATIVE 10/02/2020 1100  LEUKOCYTESUR NEGATIVE 10/02/2020 1100   Sepsis Labs: @LABRCNTIP$ (procalcitonin:4,lacticidven:4)  )No results found for this or any previous visit (from the past 240 hour(s)).    Studies: ECHOCARDIOGRAM COMPLETE  Result Date: 02/24/2022    ECHOCARDIOGRAM REPORT   Patient Name:   KAYSTON BURKART Date of Exam: 02/24/2022 Medical Rec #:  [id number]     Height:       48.0 in Accession #:    [id number]    Weight:       180.3 lb Date of Birth:  [birth year]     BSA:          1.519 m Patient Age:    56 years      BP:           151/97 mmHg Patient Gender: M             HR:           94 bpm. Exam Location:  Inpatient Procedure: 2D Echo, Cardiac Doppler, Color Doppler and Intracardiac            Opacification Agent Indications:    I50.40* Unspecified combined systolic (congestive) and diastolic                 (congestive)  heart failure  History:        Patient has no prior history of Echocardiogram examinations.                 Abnormal ECG, COPD, Arrythmias:Atrial Fibrillation; Risk                 Factors:Diabetes, Sleep Apnea and Hypertension.  Sonographer:    Roseanna Rainbow RDCS Referring Phys: [id number] [facility]  Sonographer Comments: Technically difficult study due to poor echo windows, suboptimal parasternal window, suboptimal apical window, suboptimal subcostal window and patient is obese. Image acquisition challenging due to patient body habitus. Patient could not follow instructions. Could not turn. IMPRESSIONS  1. Left ventricular ejection fraction, by estimation, is 35 to 40%. The left ventricle has moderately decreased function. The left ventricle demonstrates global hypokinesis. There is mild concentric left ventricular hypertrophy. Left ventricular diastolic parameters are indeterminate.  2. Right ventricular systolic function is normal. The right ventricular size is severely enlarged. There is severely elevated pulmonary artery systolic pressure. The estimated right ventricular systolic pressure is 0000000 mmHg.  3. Left atrial size was severely dilated.  4. Right atrial size was moderately dilated.  5. The mitral valve is abnormal. No evidence of mitral valve regurgitation. No evidence of mitral stenosis. The mean mitral valve gradient is 3.0 mmHg.  6. Discrepancy between color flow and spectral Doppler for tricuspid regurgitation assessment; study likely understimate tricuspid regurgitation.  7. The aortic valve was not well visualized. Aortic valve regurgitation is not visualized. Mild to moderate aortic valve stenosis. Aortic valve mean gradient measures 21.0 mmHg. Aortic valve Vmax measures 2.97 m/s. Mean gradient pre contrast imaging 15 mm Hg.  8. The inferior vena cava is dilated in size with <50% respiratory variability, suggesting right atrial pressure of 15 mmHg. Comparison(s): No prior Echocardiogram. FINDINGS   Left Ventricle: Left ventricular ejection fraction, by estimation, is 35 to 40%. The left ventricle has moderately decreased function. The left ventricle demonstrates global hypokinesis. Definity contrast agent was given IV to delineate the left ventricular endocardial borders. The left ventricular internal cavity size was normal in size. There is mild concentric left ventricular hypertrophy. Left ventricular diastolic parameters are indeterminate. Right Ventricle: The right ventricular size  is severely enlarged. Right vetricular wall thickness was not well visualized. Right ventricular systolic function is normal. There is severely elevated pulmonary artery systolic pressure. The tricuspid regurgitant velocity is 3.39 m/s, and with an assumed right atrial pressure of 15 mmHg, the estimated right ventricular systolic pressure is 0000000 mmHg. Left Atrium: Left atrial size was severely dilated. Right Atrium: Right atrial size was moderately dilated. Pericardium: There is no evidence of pericardial effusion. Mitral Valve: The mitral valve is abnormal. No evidence of mitral valve regurgitation. No evidence of mitral valve stenosis. MV peak gradient, 10.9 mmHg. The mean mitral valve gradient is 3.0 mmHg. Tricuspid Valve: Discrepancy between color flow and spectral Doppler for tricuspid regurgitation assessment; study likely understimate tricuspid regurgitation. The tricuspid valve is not well visualized. Tricuspid valve regurgitation is not demonstrated. Aortic Valve: The aortic valve was not well visualized. Aortic valve regurgitation is not visualized. Mild to moderate aortic stenosis is present. Aortic valve mean gradient measures 21.0 mmHg. Aortic valve peak gradient measures 35.3 mmHg. Aortic valve area, by VTI measures 1.20 cm. Pulmonic Valve: The pulmonic valve was grossly normal. Pulmonic valve regurgitation is trivial. No evidence of pulmonic stenosis. Aorta: The aortic root and ascending aorta are structurally  normal, with no evidence of dilitation. Venous: The inferior vena cava is dilated in size with less than 50% respiratory variability, suggesting right atrial pressure of 15 mmHg. IAS/Shunts: The interatrial septum was not well visualized.  LEFT VENTRICLE PLAX 2D LVIDd:         5.60 cm LVIDs:         4.65 cm LV PW:         1.20 cm LV IVS:        1.20 cm LVOT diam:     2.30 cm LV SV:         59 LV SV Index:   39 LVOT Area:     4.15 cm  LV Volumes (MOD) LV vol d, MOD A2C: 190.0 ml LV vol d, MOD A4C: 156.0 ml LV vol s, MOD A2C: 101.0 ml LV vol s, MOD A4C: 74.0 ml LV SV MOD A2C:     89.0 ml LV SV MOD A4C:     156.0 ml LV SV MOD BP:      85.5 ml RIGHT VENTRICLE            IVC RV S prime:     7.51 cm/s  IVC diam: 2.40 cm TAPSE (M-mode): 0.9 cm LEFT ATRIUM             Index        RIGHT ATRIUM           Index LA diam:        4.45 cm 2.93 cm/m   RA Area:     34.40 cm LA Vol (A2C):   96.3 ml 63.39 ml/m  RA Volume:   148.00 ml 97.41 ml/m LA Vol (A4C):   46.3 ml 30.47 ml/m LA Biplane Vol: 68.8 ml 45.28 ml/m  AORTIC VALVE AV Area (Vmax):    1.15 cm AV Area (Vmean):   1.14 cm AV Area (VTI):     1.20 cm AV Vmax:           297.00 cm/s AV Vmean:          189.333 cm/s AV VTI:            0.490 m AV Peak Grad:      35.3 mmHg AV Mean Grad:  21.0 mmHg LVOT Vmax:         81.85 cm/s LVOT Vmean:        51.950 cm/s LVOT VTI:          0.141 m LVOT/AV VTI ratio: 0.29  AORTA Ao Root diam: 3.40 cm Ao Asc diam:  3.60 cm MITRAL VALVE                TRICUSPID VALVE MV Area (PHT): 3.62 cm     TR Peak grad:   46.0 mmHg MV Area VTI:   1.93 cm     TR Vmax:        339.00 cm/s MV Peak grad:  10.9 mmHg MV Mean grad:  3.0 mmHg     SHUNTS MV Vmax:       1.65 m/s     Systemic VTI:  0.14 m MV Vmean:      110.0 cm/s   Systemic Diam: 2.30 cm MV Decel Time: 210 msec MV E velocity: 169.50 cm/s Rudean Haskell MD Electronically signed by Rudean Haskell MD Signature Date/Time: 02/24/2022/4:21:04 PM    Final    DG CHEST PORT 1  VIEW  Result Date: 02/24/2022 CLINICAL DATA:  Hypoxia EXAM: PORTABLE CHEST 1 VIEW COMPARISON:  Portable exam 1236 hours compared to 02/28/2022 FINDINGS: Enlargement of cardiac silhouette with pulmonary vascular congestion. Atherosclerotic calcification aorta. Scattered interstitial infiltrate, improved since previous exam, likely reflecting mild pulmonary edema, improved since previous exam. No segmental consolidation, pleural effusion, or pneumothorax. IMPRESSION: Improved pulmonary edema. Electronically Signed   By: Lavonia Dana M.D.   On: 02/24/2022 13:22    Scheduled Meds:  amLODipine  10 mg Oral Daily   baclofen  5 mg Oral TID   donepezil  10 mg Oral QHS   DULoxetine  30 mg Oral Daily   [START ON 02/25/2022] fentaNYL  1 patch Transdermal Q72H   gabapentin  900 mg Oral TID   glimepiride  4 mg Oral Q breakfast   guaiFENesin  600 mg Oral BID   insulin aspart  0-15 Units Subcutaneous TID WC   insulin aspart  0-5 Units Subcutaneous QHS   ipratropium-albuterol  3 mL Nebulization TID   mometasone-formoterol  2 puff Inhalation BID   tamsulosin  0.4 mg Oral Daily   Warfarin - Pharmacist Dosing Inpatient   Does not apply q1600    Continuous Infusions:  azithromycin 500 mg (02/24/22 1635)   cefTRIAXone (ROCEPHIN)  IV 2 g (02/24/22 1629)     LOS: 0 days     Kayleen Memos, MD [facility] Pager [phone]  If 7PM-7AM, please contact night-coverage [email or web address] Password [facility] 02/24/2022, 6:26 PM

## 2022-02-24 NOTE — Progress Notes (Signed)
  Echocardiogram 2D Echocardiogram has been performed.  Mark Russell 02/24/2022, 3:33 PM

## 2022-02-24 NOTE — Progress Notes (Signed)
ANTICOAGULATION CONSULT NOTE - Follow Up Consult  Pharmacy Consult for Coumadin Indication: atrial fibrillation  Allergies  Allergen Reactions   Ace Inhibitors     hypotension   Dilaudid [Hydromorphone Hcl]     "Mean"   Metformin And Related     GI upset   Morphine And Related Other (See Comments)    "mean"   Pletal [Cilostazol]     SOB    Patient Measurements: Height: 4' (121.9 cm) Weight: 81.8 kg (180 lb 5.4 oz) IBW/kg (Calculated) : 22.4   Vital Signs: Temp: 98.4 F (36.9 C) (02/20 1326) Temp Source: Oral (02/20 1326) BP: 151/97 (02/20 1326) Pulse Rate: 84 (02/20 1326)  Labs: Recent Labs    02/14/2022 1330 02/25/2022 1541 02/24/22 0743  HGB 11.8*  --  11.6*  HCT 39.0  --  37.3*  PLT 365  --  338  LABPROT 21.7*  --  20.3*  INR 1.9*  --  1.8*  CREATININE 0.75  --  0.73  TROPONINIHS 114* 116*  --     Estimated Creatinine Clearance: 44.9 mL/min (by C-G formula based on SCr of 0.73 mg/dL).  Assessment: AC/Heme: PTA Warfarin for PAF, hx of DVT, PVD  -  INR 1.8 today - CBC Hgb 11.6, Plts WNL 2/20 - upper extremity doppler negative for DVT - PTA 4 mg daily except 2 mg Sunday, admit INR 1.9  Goal of Therapy:  INR 2-3 Monitor platelets by anticoagulation protocol: Yes   Plan:  Coumadin 67m po x 1 tonight Daily INR  Codie Hainer S. RAlford Highland PharmD, BCPS Clinical Staff Pharmacist [email or web address] RAlford Highland CThe [employer]2/20/2024,1:53 PM

## 2022-02-25 DIAGNOSIS — I2489 Other forms of acute ischemic heart disease: Secondary | ICD-10-CM | POA: Insufficient documentation

## 2022-02-25 DIAGNOSIS — I5021 Acute systolic (congestive) heart failure: Secondary | ICD-10-CM | POA: Diagnosis not present

## 2022-02-25 DIAGNOSIS — J9611 Chronic respiratory failure with hypoxia: Secondary | ICD-10-CM | POA: Insufficient documentation

## 2022-02-25 LAB — GLUCOSE, CAPILLARY
Glucose-Capillary: 99 mg/dL (ref 70–99)
Glucose-Capillary: 215 mg/dL — ABNORMAL HIGH (ref 70–99)
Glucose-Capillary: 265 mg/dL — ABNORMAL HIGH (ref 70–99)
Glucose-Capillary: 133 mg/dL — ABNORMAL HIGH (ref 70–99)

## 2022-02-25 LAB — PROTIME-INR
INR: 2 — ABNORMAL HIGH (ref 0.8–1.2)
Prothrombin Time: 22.4 seconds — ABNORMAL HIGH (ref 11.4–15.2)

## 2022-02-25 MED ORDER — FUROSEMIDE 40 MG PO TABS
40.0000 mg | ORAL_TABLET | Freq: Once | ORAL | Status: AC
  Administered 2022-02-25: 40 mg via ORAL
  Filled 2022-02-25: qty 1

## 2022-02-25 MED ORDER — WARFARIN SODIUM 4 MG PO TABS
4.0000 mg | ORAL_TABLET | Freq: Once | ORAL | Status: AC
  Administered 2022-02-25: 4 mg via ORAL
  Filled 2022-02-25 (×2): qty 1

## 2022-02-25 NOTE — TOC Progression Note (Signed)
Transition of Care [facility]) - Progression Note    Patient Details  Name: Mark Russell MRN: [id number] Date of Birth: 1935/09/05  Transition of Care [facility]) CM/SW [place], RN Phone Number:[phone]  02/25/2022, 3:55 PM  Clinical Narrative:     Transition of Care [facility]) Screening Note   Patient Details  Name: Mark Russell Date of Birth: 03/21/1935   Transition of Care [facility]) CM/SW Contact:    Angelita Ingles, RN Phone Number: 02/25/2022, 3:56 PM    Transition of Care Department [facility]) has reviewed patient and no TOC needs have been identified at this time. We will continue to monitor patient advancement through interdisciplinary progression rounds. TOC will continue to follow for disposition needs.           Expected Discharge Plan and Services                                               Social Determinants of Health (SDOH) Interventions SDOH Screenings   Food Insecurity: No Food Insecurity (03/02/2022)  Housing: Low Risk  (02/15/2022)  Transportation Needs: No Transportation Needs (02/18/2022)  Utilities: Not At Risk (02/21/2022)  Tobacco Use: Medium Risk (02/06/2022)    Readmission Risk Interventions     No data to display

## 2022-02-25 NOTE — Progress Notes (Signed)
[place] for Warfarin Indication: paroxysmal atrial fibrillation, hx of DVT, PVD  Allergies  Allergen Reactions   Ace Inhibitors     hypotension   Dilaudid [Hydromorphone Hcl]     "Mean"   Metformin And Related     GI upset   Morphine And Related Other (See Comments)    "mean"   Pletal [Cilostazol]     SOB    Patient Measurements: Height: 4' (121.9 cm) Weight: 81.8 kg (180 lb 5.4 oz) IBW/kg (Calculated) : 22.4   Vital Signs: Temp: 99 F (37.2 C) (02/21 0910) Temp Source: Oral (02/21 0910) BP: 177/69 (02/21 0910) Pulse Rate: 79 (02/21 0910)  Labs: Recent Labs    02/26/2022 1330 02/27/2022 1541 02/24/22 0743 02/25/22 0607  HGB 11.8*  --  11.6*  --   HCT 39.0  --  37.3*  --   PLT 365  --  338  --   LABPROT 21.7*  --  20.3* 22.4*  INR 1.9*  --  1.8* 2.0*  CREATININE 0.75  --  0.73  --   TROPONINIHS 114* 116*  --   --      Estimated Creatinine Clearance: 44.9 mL/min (by C-G formula based on SCr of 0.73 mg/dL).   Medical History: Past Medical History:  Diagnosis Date   Anxiety    Avascular necrosis (HCC)    BPH (benign prostatic hyperplasia)    COPD (chronic obstructive pulmonary disease) (HCC)    Depression    Diabetes mellitus    DVT (deep venous thrombosis) (HCC)    GERD (gastroesophageal reflux disease)    History of leg amputation (HCC)    both legs   Hypertension    Neuropathy    Peripheral vascular disease (HCC)    Sleep apnea     Medications:  Spoke to patient's daughters and son. Patient's usual warfarin regimen is 49m daily except 225mon Sundays. However, INR check on 2/15 was supratherapeutic at 4, and patient was instructed to take 75m74mn 2/15-2/18 and then resume usual dosing today. Last dose (75mg26maken PTA was 2/18 at 1800.   Assessment: 86 y79M with PMH of recent diagnosis of lung cancer, PAF, DVT, PVD s/p bilateral BKA on chronic warfarin therapy presenting with arm swelling and weakness. Bilateral  upper extremity Doppler negative for DVT. Patient found to have CAP and started on Ceftriaxone/Azithromycin. Pharmacy consulted for warfarin dosing while patient admitted.   Today, 02/25/22: INR = 2, therapeutic CBC (2/20): Hgb 11.6, Plt WNL Note that broad spectrum antibiotics can increase INR  Goal of Therapy:  INR 2-3 Monitor platelets by anticoagulation protocol: Yes   Plan:  Warfarin 4mg 89mx 1 today as per home regimen Daily PT/INR Monitor CBC and for s/sx of bleeding   JignaLindell SparrmD, BCPS Clinical Pharmacist 02/25/2022,10:17 AM

## 2022-02-25 NOTE — Progress Notes (Addendum)
[id number] [facility]) Hospital Liaison Note    Daimien Vanderzee is an current [place] patient from home with a terminal diagnosis of Unspecified primary malignant neoplasm with secondary neoplasm to the bone and lung. Family called [facility] with patient complaints of increased weakness/swelling to the BUE. An [facility] RN made a visit to assess patient when family requested patient to be transported and evaluated at the hospital. Patient was transported via EMS to the [facility] ED on 2/19 for evaluation. Patient was admitted to the [facility] with weakness as well as for treatment for CAP on 2/19.  Per [place], Dr. Tomasa Hosteller, this is a related admission.    Visited Mr. Howden at the bedside. Daughter was present at bedside. Patient was sitting up in his bed, alert and conversant. Able to follow commands and make needs know. Reported that he was feeling better.  Report exchanged with RN. Provided support to the family and ensured that [facility] would be following patient while he was in the hospital and arrange/make sure that any needed equipment would be delivered prior to when patient is stable to discharge.    Mr. Overgaard is inpatient appropriate due to requiring IV medications for treatment of Community Acquired  pneumonia.    V/S: 174/70 bp, 99.4 Temp, 79 bpm, 16 RR, 99% on 4L/min via [place]   I/O: 120/1050   Labs:  Prothrombin Time: 22.4 (H) INR: 2.0 (H)  Diagnostics:  None today  IV/PRN:    cefTRIAXone (ROCEPHIN) 2 g in sodium chloride 0.9 % 100 mL IVPB Dose: 2 g Freq: Every 24 hours Route: IV x1  azithromycin (ZITHROMAX) 500 mg in sodium chloride 0.9 % 250 mL IVPB Dose: 500 mg Freq: Every 24 hours Route: IV x1  acetaminophen (TYLENOL) tablet 650 mg Dose: 650 mg Freq: Every 6 hours PRN Route: PO x1   oxyCODONE-acetaminophen (PERCOCET/ROXICET) 5-325 MG per tablet 1-2 tablet Dose: 1-2 tablet Freq: Every 4 hours PRN Route: PO x3  Problem list: Assessment & Plan:  Principal  Problem:   Acute systolic heart failure (HCC) Active Problems:   Diabetes type 2 with atherosclerosis of arteries of extremities (HCC)   Hypoxia   PVD (peripheral vascular disease) (HCC)   S/P AKA (above knee amputation) bilateral (HCC)   COPD (chronic obstructive pulmonary disease) (HCC)   PAF (paroxysmal atrial fibrillation) (HCC)   HTN (hypertension)   CAP (community acquired pneumonia)   Weakness   Hospice care   Chronic respiratory failure with hypoxia (HCC)   Demand ischemia     Acute systolic CHF -EF 35 to [id number] -Lasix today   Community-acquired pneumonia -Continue Rocephin, azithromycin   Chronic hypoxemic respiratory failure -Remains stable on 4 L nasal cannula O2   Generalized weakness in setting of untreated lung cancer -Currently under home hospice care   Demand ischemia -Troponin trend is flat, 114-116  -Without ACS   Hypertension -Norvasc   PAD status post bilateral AKA -Gabapentin for neuropathic pain   Paroxysmal A-fib -Currently on Coumadin GOC: Patient is a DNR.     D/C planning: Ongoing.     Family: Daughter present at bedside.     IDT: Updated   Should patient need ambulance transfer - Please use [facility]) as they contract this service for our active hospice patients.    Zigmund Gottron, RN [facility] Liaison  [phone]

## 2022-02-25 NOTE — Progress Notes (Addendum)
PROGRESS NOTE    Mark Russell  [id number] DOB: Aug 20, 1935 DOA: 02/10/2022 PCP: Collene Leyden, MD     Brief Narrative:  Mark Russell is a 87 y.o. male with medical history significant for peripheral vascular disease resulting in bilateral AKA, non-insulin-dependent type 2 diabetes, paroxysmal atrial fibrillation on Coumadin, recent diagnosis of lung cancer who declined treatment and lives with his son who was admitted to the hospital with worsening weakness, bilateral arm swelling and inability to be cared for at home.  Patient has been on home hospice, history is provided by the patient's daughter who is also closely involved in his care.  She states that up until about 5 days ago, patient was still quite active, he gets around the home with his wheelchair, he is not confused, wears 2 L of oxygen at baseline.  Over the weekend, he started getting more and more weak.  On Friday, he was seen by hospice at home, they started him on low-dose of Xanax.  Also over the weekend, they discovered that his nasal cannula oxygen had malfunction, and there was a small piece of plastic blocking the flow of oxygen.  Patient got progressively weaker over the weekend, they also noticed that he developed worsening bilateral arm swelling.  This past Friday, family and also discussed with hospice and requested that he possibly be transition to inpatient hospice house, however they were told that he does not meet criteria.   In the ER, patient has slightly elevated blood pressure, otherwise stable on 2 L nasal cannula oxygen.  Saturating well, not tachycardic.  Work reveals WBC 10.6, hemoglobin 11.8, calcium 7.5, elevated troponin 114, recheck 116.  INR 1.9.  He was started on empiric antibiotics for community-acquired pneumonia.  New events last 24 hours / Subjective: Patient ate breakfast this morning, has no complaints of worsening shortness of breath.  Assessment & Plan:  Principal Problem:   Acute systolic heart  failure (HCC) Active Problems:   Diabetes type 2 with atherosclerosis of arteries of extremities (HCC)   Hypoxia   PVD (peripheral vascular disease) (HCC)   S/P AKA (above knee amputation) bilateral (HCC)   COPD (chronic obstructive pulmonary disease) (HCC)   PAF (paroxysmal atrial fibrillation) (HCC)   HTN (hypertension)   CAP (community acquired pneumonia)   Weakness   Hospice care   Chronic respiratory failure with hypoxia (HCC)   Demand ischemia   Acute systolic CHF -EF 35 to [id number] -Lasix today  Community-acquired pneumonia -Continue Rocephin, azithromycin  Chronic hypoxemic respiratory failure -Remains stable on 4 L nasal cannula O2  Generalized weakness in setting of untreated lung cancer -Currently under home hospice care  Demand ischemia -Troponin trend is flat, 114-116  -Without ACS  Hypertension -Norvasc  PAD status post bilateral AKA -Gabapentin for neuropathic pain  Paroxysmal A-fib -Currently on Coumadin   DVT prophylaxis:  warfarin (COUMADIN) tablet 4 mg  Code Status: DNR Family Communication: No family at bedside, updated daughter over the phone  Disposition Plan:  Status is: Inpatient Remains inpatient appropriate because: CHF     Antimicrobials:  Anti-infectives (From admission, onward)    Start     Dose/Rate Route Frequency Ordered Stop   02/24/22 1700  azithromycin (ZITHROMAX) 500 mg in sodium chloride 0.9 % 250 mL IVPB        500 mg 250 mL/hr over 60 Minutes Intravenous Every 24 hours 02/16/2022 1831     02/24/22 1600  cefTRIAXone (ROCEPHIN) 1 g in sodium chloride 0.9 % 100 mL IVPB  Status:  Discontinued        1 g 200 mL/hr over 30 Minutes Intravenous Every 24 hours 02/06/2022 1831 02/24/22 1143   02/24/22 1600  cefTRIAXone (ROCEPHIN) 2 g in sodium chloride 0.9 % 100 mL IVPB        2 g 200 mL/hr over 30 Minutes Intravenous Every 24 hours 02/24/22 1143     02/21/2022 1600  cefTRIAXone (ROCEPHIN) 1 g in sodium chloride 0.9 % 100 mL IVPB         1 g 200 mL/hr over 30 Minutes Intravenous  Once 02/16/2022 1547 02/28/2022 1716   02/14/2022 1600  azithromycin (ZITHROMAX) 500 mg in sodium chloride 0.9 % 250 mL IVPB        500 mg 250 mL/hr over 60 Minutes Intravenous  Once 02/08/2022 1547 02/19/2022 1830        Objective: Vitals:   02/24/22 2155 02/25/22 0642 02/25/22 0910 02/25/22 0929  BP: (!) 143/65 (!) 179/73 (!) 177/69   Pulse: 85 76 79   Resp: 14 14    Temp: 98.8 F (37.1 C) 98.7 F (37.1 C) 99 F (37.2 C)   TempSrc: Oral Oral Oral   SpO2: 98% 98% 97% 96%  Weight:      Height:        Intake/Output Summary (Last 24 hours) at 02/25/2022 1212 Last data filed at 02/25/2022 0910 Gross per 24 hour  Intake 360 ml  Output 750 ml  Net -390 ml   Filed Weights   02/08/2022 1233  Weight: 81.8 kg    Examination:  General exam: Appears calm and comfortable  Respiratory system: Clear to auscultation. Respiratory effort normal. No respiratory distress. No conversational dyspnea.  Cardiovascular system: S1 & S2 heard, RRR. No murmurs. +edema of arms  Gastrointestinal system: Abdomen is nondistended, soft and nontender. Normal bowel sounds heard. Central nervous system: Alert  Extremities: Bilat AKA  Skin: No rashes, lesions or ulcers on exposed skin    Data Reviewed: I have personally reviewed following labs and imaging studies  CBC: Recent Labs  Lab 02/28/2022 1330 02/24/22 0743  WBC 10.6* 10.6*  NEUTROABS 7.7  --   HGB 11.8* 11.6*  HCT 39.0 37.3*  MCV 102.9* 102.5*  PLT 365 [id number]   Basic Metabolic Panel: Recent Labs  Lab 02/27/2022 1330 02/24/22 0743  NA 136 132*  K 3.5 4.0  CL 96* 92*  CO2 30 30  GLUCOSE 187* 152*  BUN 8 8  CREATININE 0.75 0.73  CALCIUM 7.5* 8.0*   GFR: Estimated Creatinine Clearance: 44.9 mL/min (by C-G formula based on SCr of 0.73 mg/dL). Liver Function Tests: Recent Labs  Lab 03/02/2022 1330  AST 32  ALT 67*  ALKPHOS 72  BILITOT 1.0  PROT 6.9  ALBUMIN 3.0*   No results for  input(s): "LIPASE", "AMYLASE" in the last 168 hours. Recent Labs  Lab 02/25/2022 1330  AMMONIA 48*   Coagulation Profile: Recent Labs  Lab 02/16/2022 1330 02/24/22 0743 02/25/22 0607  INR 1.9* 1.8* 2.0*   Cardiac Enzymes: No results for input(s): "CKTOTAL", "CKMB", "CKMBINDEX", "TROPONINI" in the last 168 hours. BNP (last 3 results) No results for input(s): "PROBNP" in the last 8760 hours. HbA1C: Recent Labs    02/06/2022 1330  HGBA1C 6.0*   CBG: Recent Labs  Lab 02/24/22 0802 02/24/22 1143 02/24/22 1554 02/24/22 2156 02/25/22 0741  GLUCAP 165* 143* 71 120* 99   Lipid Profile: No results for input(s): "CHOL", "HDL", "LDLCALC", "TRIG", "CHOLHDL", "LDLDIRECT" in the last 72  hours. Thyroid Function Tests: No results for input(s): "TSH", "T4TOTAL", "FREET4", "T3FREE", "THYROIDAB" in the last 72 hours. Anemia Panel: No results for input(s): "VITAMINB12", "FOLATE", "FERRITIN", "TIBC", "IRON", "RETICCTPCT" in the last 72 hours. Sepsis Labs: No results for input(s): "PROCALCITON", "LATICACIDVEN" in the last 168 hours.  No results found for this or any previous visit (from the past 240 hour(s)).    Radiology Studies: ECHOCARDIOGRAM COMPLETE  Result Date: 02/24/2022    ECHOCARDIOGRAM REPORT   Patient Name:   CERRONE BARBUSH Date of Exam: 02/24/2022 Medical Rec #:  [id number]     Height:       48.0 in Accession #:    [id number]    Weight:       180.3 lb Date of Birth:  1935-06-11     BSA:          1.519 m Patient Age:    17 years      BP:           151/97 mmHg Patient Gender: M             HR:           94 bpm. Exam Location:  Inpatient Procedure: 2D Echo, Cardiac Doppler, Color Doppler and Intracardiac            Opacification Agent Indications:    I50.40* Unspecified combined systolic (congestive) and diastolic                 (congestive) heart failure  History:        Patient has no prior history of Echocardiogram examinations.                 Abnormal ECG, COPD, Arrythmias:Atrial  Fibrillation; Risk                 Factors:Diabetes, Sleep Apnea and Hypertension.  Sonographer:    Roseanna Rainbow RDCS Referring Phys: [id number] [facility]  Sonographer Comments: Technically difficult study due to poor echo windows, suboptimal parasternal window, suboptimal apical window, suboptimal subcostal window and patient is obese. Image acquisition challenging due to patient body habitus. Patient could not follow instructions. Could not turn. IMPRESSIONS  1. Left ventricular ejection fraction, by estimation, is 35 to 40%. The left ventricle has moderately decreased function. The left ventricle demonstrates global hypokinesis. There is mild concentric left ventricular hypertrophy. Left ventricular diastolic parameters are indeterminate.  2. Right ventricular systolic function is normal. The right ventricular size is severely enlarged. There is severely elevated pulmonary artery systolic pressure. The estimated right ventricular systolic pressure is 0000000 mmHg.  3. Left atrial size was severely dilated.  4. Right atrial size was moderately dilated.  5. The mitral valve is abnormal. No evidence of mitral valve regurgitation. No evidence of mitral stenosis. The mean mitral valve gradient is 3.0 mmHg.  6. Discrepancy between color flow and spectral Doppler for tricuspid regurgitation assessment; study likely understimate tricuspid regurgitation.  7. The aortic valve was not well visualized. Aortic valve regurgitation is not visualized. Mild to moderate aortic valve stenosis. Aortic valve mean gradient measures 21.0 mmHg. Aortic valve Vmax measures 2.97 m/s. Mean gradient pre contrast imaging 15 mm Hg.  8. The inferior vena cava is dilated in size with <50% respiratory variability, suggesting right atrial pressure of 15 mmHg. Comparison(s): No prior Echocardiogram. FINDINGS  Left Ventricle: Left ventricular ejection fraction, by estimation, is 35 to 40%. The left ventricle has moderately decreased function. The left  ventricle demonstrates global hypokinesis. Definity contrast  agent was given IV to delineate the left ventricular endocardial borders. The left ventricular internal cavity size was normal in size. There is mild concentric left ventricular hypertrophy. Left ventricular diastolic parameters are indeterminate. Right Ventricle: The right ventricular size is severely enlarged. Right vetricular wall thickness was not well visualized. Right ventricular systolic function is normal. There is severely elevated pulmonary artery systolic pressure. The tricuspid regurgitant velocity is 3.39 m/s, and with an assumed right atrial pressure of 15 mmHg, the estimated right ventricular systolic pressure is 0000000 mmHg. Left Atrium: Left atrial size was severely dilated. Right Atrium: Right atrial size was moderately dilated. Pericardium: There is no evidence of pericardial effusion. Mitral Valve: The mitral valve is abnormal. No evidence of mitral valve regurgitation. No evidence of mitral valve stenosis. MV peak gradient, 10.9 mmHg. The mean mitral valve gradient is 3.0 mmHg. Tricuspid Valve: Discrepancy between color flow and spectral Doppler for tricuspid regurgitation assessment; study likely understimate tricuspid regurgitation. The tricuspid valve is not well visualized. Tricuspid valve regurgitation is not demonstrated. Aortic Valve: The aortic valve was not well visualized. Aortic valve regurgitation is not visualized. Mild to moderate aortic stenosis is present. Aortic valve mean gradient measures 21.0 mmHg. Aortic valve peak gradient measures 35.3 mmHg. Aortic valve area, by VTI measures 1.20 cm. Pulmonic Valve: The pulmonic valve was grossly normal. Pulmonic valve regurgitation is trivial. No evidence of pulmonic stenosis. Aorta: The aortic root and ascending aorta are structurally normal, with no evidence of dilitation. Venous: The inferior vena cava is dilated in size with less than 50% respiratory variability, suggesting  right atrial pressure of 15 mmHg. IAS/Shunts: The interatrial septum was not well visualized.  LEFT VENTRICLE PLAX 2D LVIDd:         5.60 cm LVIDs:         4.65 cm LV PW:         1.20 cm LV IVS:        1.20 cm LVOT diam:     2.30 cm LV SV:         59 LV SV Index:   39 LVOT Area:     4.15 cm  LV Volumes (MOD) LV vol d, MOD A2C: 190.0 ml LV vol d, MOD A4C: 156.0 ml LV vol s, MOD A2C: 101.0 ml LV vol s, MOD A4C: 74.0 ml LV SV MOD A2C:     89.0 ml LV SV MOD A4C:     156.0 ml LV SV MOD BP:      85.5 ml RIGHT VENTRICLE            IVC RV S prime:     7.51 cm/s  IVC diam: 2.40 cm TAPSE (M-mode): 0.9 cm LEFT ATRIUM             Index        RIGHT ATRIUM           Index LA diam:        4.45 cm 2.93 cm/m   RA Area:     34.40 cm LA Vol (A2C):   96.3 ml 63.39 ml/m  RA Volume:   148.00 ml 97.41 ml/m LA Vol (A4C):   46.3 ml 30.47 ml/m LA Biplane Vol: 68.8 ml 45.28 ml/m  AORTIC VALVE AV Area (Vmax):    1.15 cm AV Area (Vmean):   1.14 cm AV Area (VTI):     1.20 cm AV Vmax:           297.00 cm/s AV Vmean:  189.333 cm/s AV VTI:            0.490 m AV Peak Grad:      35.3 mmHg AV Mean Grad:      21.0 mmHg LVOT Vmax:         81.85 cm/s LVOT Vmean:        51.950 cm/s LVOT VTI:          0.141 m LVOT/AV VTI ratio: 0.29  AORTA Ao Root diam: 3.40 cm Ao Asc diam:  3.60 cm MITRAL VALVE                TRICUSPID VALVE MV Area (PHT): 3.62 cm     TR Peak grad:   46.0 mmHg MV Area VTI:   1.93 cm     TR Vmax:        339.00 cm/s MV Peak grad:  10.9 mmHg MV Mean grad:  3.0 mmHg     SHUNTS MV Vmax:       1.65 m/s     Systemic VTI:  0.14 m MV Vmean:      110.0 cm/s   Systemic Diam: 2.30 cm MV Decel Time: 210 msec MV E velocity: 169.50 cm/s Rudean Haskell MD Electronically signed by Rudean Haskell MD Signature Date/Time: 02/24/2022/4:21:04 PM    Final    DG CHEST PORT 1 VIEW  Result Date: 02/24/2022 CLINICAL DATA:  Hypoxia EXAM: PORTABLE CHEST 1 VIEW COMPARISON:  Portable exam 1236 hours compared to 03/03/2022 FINDINGS:  Enlargement of cardiac silhouette with pulmonary vascular congestion. Atherosclerotic calcification aorta. Scattered interstitial infiltrate, improved since previous exam, likely reflecting mild pulmonary edema, improved since previous exam. No segmental consolidation, pleural effusion, or pneumothorax. IMPRESSION: Improved pulmonary edema. Electronically Signed   By: Lavonia Dana M.D.   On: 02/24/2022 13:22   UE VENOUS DUPLEX (7am - 7pm)  Result Date: 02/24/2022 UPPER VENOUS STUDY  Patient Name:  CECILIO KOPKO  Date of Exam:   02/18/2022 Medical Rec #: [id number]      Accession #:    [id number] Date of Birth: 07-24-1935      Patient Gender: M Patient Age:   32 years Exam Location:  [facility] Procedure:      VAS [place] UPPER EXTREMITY VENOUS DUPLEX Referring Phys: Lurena Nida --------------------------------------------------------------------------------  Indications: Bilateral arm weakness, swelling/pain Comparison Study: No prior studies. Performing Technologist: Darlin Coco RDMS, RVT  Examination Guidelines: A complete evaluation includes B-mode imaging, spectral Doppler, color Doppler, and power Doppler as needed of all accessible portions of each vessel. Bilateral testing is considered an integral part of a complete examination. Limited examinations for reoccurring indications may be performed as noted.  Right Findings: +----------+------------+---------+-----------+----------+-------+ RIGHT     CompressiblePhasicitySpontaneousPropertiesSummary +----------+------------+---------+-----------+----------+-------+ IJV           Full       Yes       Yes                      +----------+------------+---------+-----------+----------+-------+ Subclavian               Yes       Yes                      +----------+------------+---------+-----------+----------+-------+ Axillary      Full       Yes       Yes                       +----------+------------+---------+-----------+----------+-------+  Brachial      Full                                          +----------+------------+---------+-----------+----------+-------+ Radial        Full                                          +----------+------------+---------+-----------+----------+-------+ Ulnar         Full                                          +----------+------------+---------+-----------+----------+-------+ Cephalic      Full                                          +----------+------------+---------+-----------+----------+-------+ Basilic       Full                                          +----------+------------+---------+-----------+----------+-------+  Left Findings: +----------+------------+---------+-----------+----------+-------+ LEFT      CompressiblePhasicitySpontaneousPropertiesSummary +----------+------------+---------+-----------+----------+-------+ IJV           Full       Yes       Yes                      +----------+------------+---------+-----------+----------+-------+ Subclavian               Yes       Yes                      +----------+------------+---------+-----------+----------+-------+ Axillary      Full       Yes       Yes                      +----------+------------+---------+-----------+----------+-------+ Brachial      Full                                          +----------+------------+---------+-----------+----------+-------+ Radial        Full                                          +----------+------------+---------+-----------+----------+-------+ Ulnar         Full                                          +----------+------------+---------+-----------+----------+-------+ Cephalic      Full                                          +----------+------------+---------+-----------+----------+-------+  Basilic       Full                                           +----------+------------+---------+-----------+----------+-------+  Summary:  Right: No evidence of deep vein thrombosis in the upper extremity. No evidence of superficial vein thrombosis in the upper extremity.  Left: No evidence of deep vein thrombosis in the upper extremity. No evidence of superficial vein thrombosis in the upper extremity.  *See table(s) above for measurements and observations.  Diagnosing physician: Deitra Mayo MD Electronically signed by Deitra Mayo MD on 02/24/2022 at 8:11:24 AM.    Final    CT Head Wo Contrast  Result Date: 02/07/2022 CLINICAL DATA:  Mental status change, unknown cause altered. EXAM: CT HEAD WITHOUT CONTRAST TECHNIQUE: Contiguous axial images were obtained from the base of the skull through the vertex without intravenous contrast. RADIATION DOSE REDUCTION: This exam was performed according to the departmental dose-optimization program which includes automated exposure control, adjustment of the mA and/or kV according to patient size and/or use of iterative reconstruction technique. COMPARISON:  Head CT 09/09/2020 FINDINGS: Brain: There is no evidence of an acute infarct, intracranial hemorrhage, midline shift, or extra-axial fluid collection. A small chronic left frontoparietal infarct at the medial aspect of the central sulcus, a chronic infarct involving the left caudate head and periventricular white matter, and a small chronic left cerebellar infarct are unchanged. There is mild-to-moderate cerebral atrophy. A 2 x 0.5 cm calcified extra-axial lesion over the left lateral frontal convexity is unchanged and compatible with a meningioma, without associated mass effect or edema. Vascular: Calcified atherosclerosis at the skull base. No hyperdense vessel. Skull: No acute fracture or suspicious osseous lesion. Sinuses/Orbits: The visualized paranasal sinuses and mastoid air cells are clear. Bilateral cataract extraction. Other: None. IMPRESSION: 1.  No evidence of acute intracranial abnormality. 2. Chronic infarcts as above. Electronically Signed   By: Logan Bores M.D.   On: 02/11/2022 15:22   DG Chest 2 View  Result Date: 02/28/2022 CLINICAL DATA:  Shortness of breath, confusion. Recent diagnosis of lung cancer. EXAM: CHEST - 2 VIEW COMPARISON:  Chest radiograph 01/11/2022, PET-CT 01/29/2022 FINDINGS: The heart is enlarged, unchanged. The upper mediastinal contours are stable. There is new patchy opacity in the right lower lobe with a small right pleural effusion. There is vascular congestion and mild pulmonary interstitial edema, worsened in the interim. Nodular opacity is again seen in the left upper lobe better seen on prior CT. There is no appreciable pneumothorax. The pathologic fracture of the left clavicle is again noted common better seen on the prior PET-CT. IMPRESSION: 1. New patchy opacities in the right lower lobe may reflect infection in the correct clinical setting. 2. Vascular congestion with mild pulmonary interstitial edema and small right pleural effusion, new since the prior radiograph. Electronically Signed   By: Valetta Mole M.D.   On: 02/15/2022 13:58      Scheduled Meds:  amLODipine  10 mg Oral Daily   baclofen  5 mg Oral TID   donepezil  10 mg Oral QHS   DULoxetine  30 mg Oral Daily   fentaNYL  1 patch Transdermal Q72H   furosemide  40 mg Oral Once   gabapentin  900 mg Oral TID   guaiFENesin  600 mg Oral BID   insulin aspart  0-15 Units Subcutaneous TID WC   insulin  aspart  0-5 Units Subcutaneous QHS   ipratropium-albuterol  3 mL Nebulization TID   mometasone-formoterol  2 puff Inhalation BID   tamsulosin  0.4 mg Oral Daily   warfarin  4 mg Oral ONCE-1600   Warfarin - Pharmacist Dosing Inpatient   Does not apply q1600   Continuous Infusions:  azithromycin 500 mg (02/24/22 1635)   cefTRIAXone (ROCEPHIN)  IV 2 g (02/24/22 1629)     LOS: 1 day   Time spent: 35 minutes   Dessa Phi, DO [facility] 02/25/2022, 12:12 PM   Available via Epic secure chat 7am-7pm After these hours, please refer to coverage provider listed on [email or web address]

## 2022-02-26 ENCOUNTER — Inpatient Hospital Stay (HOSPITAL_COMMUNITY)

## 2022-02-26 DIAGNOSIS — I5021 Acute systolic (congestive) heart failure: Secondary | ICD-10-CM | POA: Diagnosis not present

## 2022-02-26 LAB — BASIC METABOLIC PANEL
Anion gap: 14 (ref 5–15)
BUN: 17 mg/dL (ref 8–23)
CO2: 27 mmol/L (ref 22–32)
Calcium: 8.2 mg/dL — ABNORMAL LOW (ref 8.9–10.3)
Chloride: 95 mmol/L — ABNORMAL LOW (ref 98–111)
Creatinine, Ser: 0.88 mg/dL (ref 0.61–1.24)
GFR, Estimated: >60 mL/min (ref >60)
Glucose, Bld: 98 mg/dL (ref 70–99)
Potassium: 4.2 mmol/L (ref 3.5–5.1)
Sodium: 136 mmol/L (ref 135–145)

## 2022-02-26 LAB — BLOOD GAS, ARTERIAL
Acid-Base Excess: 17 mmol/L — ABNORMAL HIGH (ref 0.0–2.0)
Bicarbonate: 41.9 mmol/L — ABNORMAL HIGH (ref 20.0–28.0)
Drawn by: 54496
Expiratory PAP: 6 cmH2O
FIO2: 100 %
Inspiratory PAP: 18 cmH2O
Mode: POSITIVE
O2 Saturation: 96.1 %
Patient temperature: 38.5
RATE: 10 resp/min
pCO2 arterial: 52 mmHg — ABNORMAL HIGH (ref 32–48)
pH, Arterial: 7.52 — ABNORMAL HIGH (ref 7.35–7.45)
pO2, Arterial: 75 mmHg — ABNORMAL LOW (ref 83–108)

## 2022-02-26 LAB — GLUCOSE, CAPILLARY
Glucose-Capillary: 109 mg/dL — ABNORMAL HIGH (ref 70–99)
Glucose-Capillary: 119 mg/dL — ABNORMAL HIGH (ref 70–99)
Glucose-Capillary: 153 mg/dL — ABNORMAL HIGH (ref 70–99)
Glucose-Capillary: 175 mg/dL — ABNORMAL HIGH (ref 70–99)

## 2022-02-26 LAB — PROTIME-INR
INR: 2.5 — ABNORMAL HIGH (ref 0.8–1.2)
Prothrombin Time: 26.6 seconds — ABNORMAL HIGH (ref 11.4–15.2)

## 2022-02-26 MED ORDER — WARFARIN SODIUM 4 MG PO TABS
4.0000 mg | ORAL_TABLET | Freq: Once | ORAL | Status: AC
  Administered 2022-02-26: 4 mg via ORAL
  Filled 2022-02-26: qty 1

## 2022-02-26 MED ORDER — FUROSEMIDE 10 MG/ML IJ SOLN
40.0000 mg | Freq: Four times a day (QID) | INTRAMUSCULAR | Status: AC
  Administered 2022-02-26 (×2): 40 mg via INTRAVENOUS
  Filled 2022-02-26 (×2): qty 4

## 2022-02-26 MED ORDER — FENTANYL CITRATE PF 50 MCG/ML IJ SOSY
12.5000 ug | PREFILLED_SYRINGE | INTRAMUSCULAR | Status: DC | PRN
  Administered 2022-02-26 – 2022-02-27 (×8): 12.5 ug via INTRAVENOUS
  Filled 2022-02-26 (×8): qty 1

## 2022-02-26 MED ORDER — HYDRALAZINE HCL 20 MG/ML IJ SOLN
10.0000 mg | INTRAMUSCULAR | Status: DC | PRN
  Administered 2022-02-27 (×2): 10 mg via INTRAVENOUS
  Filled 2022-02-26 (×2): qty 1

## 2022-02-26 MED ORDER — FUROSEMIDE 10 MG/ML IJ SOLN
40.0000 mg | Freq: Once | INTRAMUSCULAR | Status: AC
  Administered 2022-02-26: 40 mg via INTRAVENOUS
  Filled 2022-02-26: qty 4

## 2022-02-26 MED ORDER — LIDOCAINE 5 % EX PTCH
1.0000 | MEDICATED_PATCH | CUTANEOUS | Status: DC
  Administered 2022-02-26 – 2022-02-27 (×2): 1 via TRANSDERMAL
  Filled 2022-02-26 (×4): qty 1

## 2022-02-26 MED ORDER — SCOPOLAMINE 1 MG/3DAYS TD PT72
1.0000 | MEDICATED_PATCH | TRANSDERMAL | Status: AC
  Administered 2022-02-26: 1.5 mg via TRANSDERMAL
  Filled 2022-02-26: qty 1

## 2022-02-26 MED ORDER — CHLORHEXIDINE GLUCONATE CLOTH 2 % EX PADS
6.0000 | MEDICATED_PAD | Freq: Every day | CUTANEOUS | Status: DC
  Administered 2022-02-26: 6 via TOPICAL

## 2022-02-26 NOTE — Progress Notes (Signed)
Gave report to Destiny, ICU RN at bedside. Left number if she had any questions.

## 2022-02-26 NOTE — Progress Notes (Addendum)
   02/26/22 2041  Assess: MEWS Score  Pulse Rate (!) 107  SpO2 (!) 88 %  O2 Device Non-rebreather Mask  O2 Flow Rate (L/min) 15 L/min  Assess: MEWS Score  MEWS Temp 0  MEWS Systolic 0  MEWS Pulse 1  MEWS RR 0  MEWS LOC 1  MEWS Score 2  MEWS Score Color Yellow  Assess: if the MEWS score is Yellow or Red  Were vital signs taken at a resting state? Yes  Focused Assessment No change from prior assessment  Does the patient meet 2 or more of the SIRS criteria? No  MEWS guidelines implemented  Yes, yellow  Treat  MEWS Interventions Considered administering scheduled or prn medications/treatments as ordered  Take Vital Signs  Increase Vital Sign Frequency  Yellow: Q2hr x1, continue Q4hrs until patient remains green for 12hrs  Escalate  MEWS: Escalate Yellow: Discuss with charge nurse and consider notifying provider and/or RRT  Notify: Charge Nurse/RN  Name of Charge Nurse/RN Notified Renita, RN  Provider Notification  Provider Name/Title A. Zebedee Iba, NP  Date Provider Notified 02/26/22  Time Provider Notified 2042  Method of Notification Face-to-face  Notification Reason Change in status;Other (Comment) (significant drop in O2 sats)  Provider response At bedside  Date of Provider Response 02/26/22  Time of Provider Response 2043  Notify: Rapid Response  Name of Rapid Response RN Notified Mandy, RN  Date Rapid Response Notified 02/26/22  Time Rapid Response Notified 2043  Assess: SIRS CRITERIA  SIRS Temperature  0  SIRS Pulse 1  SIRS Respirations  0  SIRS WBC 0  SIRS Score Sum  1   New order for blood gas and NRB and CXR. RR RN in room at this time. Patient transferred to step-down ICU for BiPAP orders.

## 2022-02-26 NOTE — Progress Notes (Signed)
While obtaining VS patients o2 level went to 85% on 3Liters via nasal canula. Ingrid primary RN aware and placed on 5L , oxygen then came up to 90% . States she will reach out to respiratory therapy.,       02/26/22 1336  Vitals  Temp 98.7 F (37.1 C)  Temp Source Oral  BP (!) 142/60  BP Location Right Arm  Patient Position (if appropriate) Lying  Pulse Rate 80  Pulse Rate Source Dinamap  Resp 18  Level of Consciousness  Level of Consciousness Responds to Voice  Oxygen Therapy  SpO2 90 %

## 2022-02-26 NOTE — Progress Notes (Signed)
OT Cancellation Note  Patient Details Name: Mark Russell MRN: [id number] DOB: [birth year]   Cancelled Treatment:    Reason Eval/Treat Not Completed: OT screened, discussed with nsg, chart reviewed. Plan is most likely home with hospice. Limited pt participation at this time due to decline in medical status. OT will sign off at this time. Please re-consult if needed. thanks  [facility] 02/26/2022, 3:47 PM

## 2022-02-26 NOTE — Progress Notes (Signed)
Chaplain met with Mark Russell's family to provide support.  They were grateful for the support, but reported no particular needs at this time.  They are spending time with him and doing what they can to make him comfortable.  Please page if needs arise.  [address], [place] Pager, [phone]

## 2022-02-26 NOTE — Progress Notes (Addendum)
Patient's O2 on 6L N/C per RT was 79%. I rechecked and it is 82%. Concerned about code status and significant decrease in O2, notified A. Zebedee Iba, NP. She is in room assessing patient and speaking to family to clarify wishes of patient/family. Placed on NRB per A. Zebedee Iba, NP.

## 2022-02-26 NOTE — Progress Notes (Signed)
PROGRESS NOTE    Mark Russell  [id number] DOB: 07-19-1935 DOA: 02/28/2022 PCP: Collene Leyden, MD     Brief Narrative:  Mark Russell is a 87 y.o. male with medical history significant for peripheral vascular disease resulting in bilateral AKA, non-insulin-dependent type 2 diabetes, paroxysmal atrial fibrillation on Coumadin, recent diagnosis of lung cancer who declined treatment and lives with his son who was admitted to the hospital with worsening weakness, bilateral arm swelling and inability to be cared for at home.  Patient has been on home hospice, history is provided by the patient's daughter who is also closely involved in his care.  She states that up until about 5 days ago, patient was still quite active, he gets around the home with his wheelchair, he is not confused, wears 2 L of oxygen at baseline.  Over the weekend, he started getting more and more weak.  On Friday, he was seen by hospice at home, they started him on low-dose of Xanax.  Also over the weekend, they discovered that his nasal cannula oxygen had malfunction, and there was a small piece of plastic blocking the flow of oxygen.  Patient got progressively weaker over the weekend, they also noticed that he developed worsening bilateral arm swelling.  This past Friday, family and also discussed with hospice and requested that he possibly be transition to inpatient hospice house, however they were told that he does not meet criteria.   In the ER, patient has slightly elevated blood pressure, otherwise stable on 2 L nasal cannula oxygen.  Saturating well, not tachycardic.  Work reveals WBC 10.6, hemoglobin 11.8, calcium 7.5, elevated troponin 114, recheck 116.  INR 1.9.  He was started on empiric antibiotics for community-acquired pneumonia.  New events last 24 hours / Subjective: Patient had clinical decline over the last 24 hours.  This morning, patient was alert but easy to fall asleep.  His breathing has worsened and appears to  be more short of breath.  Does not interact or answer his questions as easily as he did yesterday.  Swelling of his upper extremities has also worsened.  Discussed with son and grandson at bedside, his daughter was on FaceTime during my evaluation.  Plan to reconvene in patient's room around 1:00 this afternoon to reevaluate patient's improvement versus decline.  Assessment & Plan:  Principal Problem:   Acute systolic heart failure (HCC) Active Problems:   Diabetes type 2 with atherosclerosis of arteries of extremities (HCC)   Hypoxia   PVD (peripheral vascular disease) (HCC)   S/P AKA (above knee amputation) bilateral (HCC)   COPD (chronic obstructive pulmonary disease) (HCC)   PAF (paroxysmal atrial fibrillation) (HCC)   HTN (hypertension)   CAP (community acquired pneumonia)   Weakness   Hospice care   Chronic respiratory failure with hypoxia (HCC)   Demand ischemia   Acute systolic CHF -EF 35 to [id number] -Increase Lasix to IV, 2 doses today  Community-acquired pneumonia -Continue Rocephin, azithromycin  Chronic hypoxemic respiratory failure -Remains on 4 L nasal cannula O2  Generalized weakness in setting of untreated lung cancer -Currently under home hospice care  Demand ischemia -Troponin trend is flat, 114-116  -Without ACS  Hypertension -Norvasc -Added hydralazine IV as needed  PAD status post bilateral AKA -Gabapentin for neuropathic pain  Paroxysmal A-fib -Currently on Coumadin   DVT prophylaxis:  warfarin (COUMADIN) tablet 4 mg  Code Status: DNR Family Communication: Son, grandson and daughter Disposition Plan:  Status is: Inpatient Remains inpatient appropriate because: CHF  Antimicrobials:  Anti-infectives (From admission, onward)    Start     Dose/Rate Route Frequency Ordered Stop   02/24/22 1700  azithromycin (ZITHROMAX) 500 mg in sodium chloride 0.9 % 250 mL IVPB        500 mg 250 mL/hr over 60 Minutes Intravenous Every 24 hours 02/06/2022  1831 02/28/22 1659   02/24/22 1600  cefTRIAXone (ROCEPHIN) 1 g in sodium chloride 0.9 % 100 mL IVPB  Status:  Discontinued        1 g 200 mL/hr over 30 Minutes Intravenous Every 24 hours 02/27/2022 1831 02/24/22 1143   02/24/22 1600  cefTRIAXone (ROCEPHIN) 2 g in sodium chloride 0.9 % 100 mL IVPB        2 g 200 mL/hr over 30 Minutes Intravenous Every 24 hours 02/24/22 1143 02/28/22 1559   02/24/2022 1600  cefTRIAXone (ROCEPHIN) 1 g in sodium chloride 0.9 % 100 mL IVPB        1 g 200 mL/hr over 30 Minutes Intravenous  Once 02/07/2022 1547 03/04/2022 1716   02/17/2022 1600  azithromycin (ZITHROMAX) 500 mg in sodium chloride 0.9 % 250 mL IVPB        500 mg 250 mL/hr over 60 Minutes Intravenous  Once 02/20/2022 1547 02/26/2022 1830        Objective: Vitals:   02/25/22 2132 02/26/22 0438 02/26/22 0819 02/26/22 0915  BP: (!) 158/86 (!) 173/90 (S) (!) 189/82   Pulse: 76 83 95   Resp: 14 14 (!) 21   Temp: 97.7 F (36.5 C) 98.3 F (36.8 C) 98.6 F (37 C)   TempSrc: Oral Oral Oral   SpO2: 92% 94% 95% 97%  Weight:      Height:        Intake/Output Summary (Last 24 hours) at 02/26/2022 1049 Last data filed at 02/26/2022 [id number] Gross per 24 hour  Intake 938.09 ml  Output 1300 ml  Net -361.91 ml    Filed Weights   02/05/2022 1233  Weight: 81.8 kg    Examination:  General exam: Appears calm, fatigued, lethargic  Respiratory system: Increase in respiratory effort, tachypneic  Cardiovascular system: S1 & S2 heard, RRR. +edema of arms, worsen from yesterday   Gastrointestinal system: Abdomen is nondistended, soft  Central nervous system: Alert  Extremities: Bilat AKA  Skin: No rashes, lesions or ulcers on exposed skin    Data Reviewed: I have personally reviewed following labs and imaging studies  CBC: Recent Labs  Lab 02/12/2022 1330 02/24/22 0743  WBC 10.6* 10.6*  NEUTROABS 7.7  --   HGB 11.8* 11.6*  HCT 39.0 37.3*  MCV 102.9* 102.5*  PLT 365 [id number]    Basic Metabolic Panel: Recent  Labs  Lab 02/19/2022 1330 02/24/22 0743 02/26/22 0636  NA 136 132* 136  K 3.5 4.0 4.2  CL 96* 92* 95*  CO2 30 30 27  $ GLUCOSE 187* 152* 98  BUN 8 8 17  $ CREATININE 0.75 0.73 0.88  CALCIUM 7.5* 8.0* 8.2*    GFR: Estimated Creatinine Clearance: 40.8 mL/min (by C-G formula based on SCr of 0.88 mg/dL). Liver Function Tests: Recent Labs  Lab 02/26/2022 1330  AST 32  ALT 67*  ALKPHOS 72  BILITOT 1.0  PROT 6.9  ALBUMIN 3.0*    No results for input(s): "LIPASE", "AMYLASE" in the last 168 hours. Recent Labs  Lab 02/26/2022 1330  AMMONIA 48*    Coagulation Profile: Recent Labs  Lab 02/28/2022 1330 02/24/22 0743 02/25/22 0607 02/26/22 0636  INR 1.9* 1.8*  2.0* 2.5*    Cardiac Enzymes: No results for input(s): "CKTOTAL", "CKMB", "CKMBINDEX", "TROPONINI" in the last 168 hours. BNP (last 3 results) No results for input(s): "PROBNP" in the last 8760 hours. HbA1C: Recent Labs    02/18/2022 1330  HGBA1C 6.0*    CBG: Recent Labs  Lab 02/25/22 0741 02/25/22 1226 02/25/22 1603 02/25/22 2135 02/26/22 0726  GLUCAP 99 215* 265* 133* 109*    Lipid Profile: No results for input(s): "CHOL", "HDL", "LDLCALC", "TRIG", "CHOLHDL", "LDLDIRECT" in the last 72 hours. Thyroid Function Tests: No results for input(s): "TSH", "T4TOTAL", "FREET4", "T3FREE", "THYROIDAB" in the last 72 hours. Anemia Panel: No results for input(s): "VITAMINB12", "FOLATE", "FERRITIN", "TIBC", "IRON", "RETICCTPCT" in the last 72 hours. Sepsis Labs: No results for input(s): "PROCALCITON", "LATICACIDVEN" in the last 168 hours.  No results found for this or any previous visit (from the past 240 hour(s)).    Radiology Studies: ECHOCARDIOGRAM COMPLETE  Result Date: 02/24/2022    ECHOCARDIOGRAM REPORT   Patient Name:   MASAMI SAMSON Date of Exam: 02/24/2022 Medical Rec #:  [id number]     Height:       48.0 in Accession #:    [id number]    Weight:       180.3 lb Date of Birth:  [birth year]     BSA:          1.519 m  Patient Age:    61 years      BP:           151/97 mmHg Patient Gender: M             HR:           94 bpm. Exam Location:  Inpatient Procedure: 2D Echo, Cardiac Doppler, Color Doppler and Intracardiac            Opacification Agent Indications:    I50.40* Unspecified combined systolic (congestive) and diastolic                 (congestive) heart failure  History:        Patient has no prior history of Echocardiogram examinations.                 Abnormal ECG, COPD, Arrythmias:Atrial Fibrillation; Risk                 Factors:Diabetes, Sleep Apnea and Hypertension.  Sonographer:    Roseanna Rainbow RDCS Referring Phys: [id number] [facility]  Sonographer Comments: Technically difficult study due to poor echo windows, suboptimal parasternal window, suboptimal apical window, suboptimal subcostal window and patient is obese. Image acquisition challenging due to patient body habitus. Patient could not follow instructions. Could not turn. IMPRESSIONS  1. Left ventricular ejection fraction, by estimation, is 35 to 40%. The left ventricle has moderately decreased function. The left ventricle demonstrates global hypokinesis. There is mild concentric left ventricular hypertrophy. Left ventricular diastolic parameters are indeterminate.  2. Right ventricular systolic function is normal. The right ventricular size is severely enlarged. There is severely elevated pulmonary artery systolic pressure. The estimated right ventricular systolic pressure is 0000000 mmHg.  3. Left atrial size was severely dilated.  4. Right atrial size was moderately dilated.  5. The mitral valve is abnormal. No evidence of mitral valve regurgitation. No evidence of mitral stenosis. The mean mitral valve gradient is 3.0 mmHg.  6. Discrepancy between color flow and spectral Doppler for tricuspid regurgitation assessment; study likely understimate tricuspid regurgitation.  7. The aortic valve was not well  visualized. Aortic valve regurgitation is not visualized.  Mild to moderate aortic valve stenosis. Aortic valve mean gradient measures 21.0 mmHg. Aortic valve Vmax measures 2.97 m/s. Mean gradient pre contrast imaging 15 mm Hg.  8. The inferior vena cava is dilated in size with <50% respiratory variability, suggesting right atrial pressure of 15 mmHg. Comparison(s): No prior Echocardiogram. FINDINGS  Left Ventricle: Left ventricular ejection fraction, by estimation, is 35 to 40%. The left ventricle has moderately decreased function. The left ventricle demonstrates global hypokinesis. Definity contrast agent was given IV to delineate the left ventricular endocardial borders. The left ventricular internal cavity size was normal in size. There is mild concentric left ventricular hypertrophy. Left ventricular diastolic parameters are indeterminate. Right Ventricle: The right ventricular size is severely enlarged. Right vetricular wall thickness was not well visualized. Right ventricular systolic function is normal. There is severely elevated pulmonary artery systolic pressure. The tricuspid regurgitant velocity is 3.39 m/s, and with an assumed right atrial pressure of 15 mmHg, the estimated right ventricular systolic pressure is 0000000 mmHg. Left Atrium: Left atrial size was severely dilated. Right Atrium: Right atrial size was moderately dilated. Pericardium: There is no evidence of pericardial effusion. Mitral Valve: The mitral valve is abnormal. No evidence of mitral valve regurgitation. No evidence of mitral valve stenosis. MV peak gradient, 10.9 mmHg. The mean mitral valve gradient is 3.0 mmHg. Tricuspid Valve: Discrepancy between color flow and spectral Doppler for tricuspid regurgitation assessment; study likely understimate tricuspid regurgitation. The tricuspid valve is not well visualized. Tricuspid valve regurgitation is not demonstrated. Aortic Valve: The aortic valve was not well visualized. Aortic valve regurgitation is not visualized. Mild to moderate aortic  stenosis is present. Aortic valve mean gradient measures 21.0 mmHg. Aortic valve peak gradient measures 35.3 mmHg. Aortic valve area, by VTI measures 1.20 cm. Pulmonic Valve: The pulmonic valve was grossly normal. Pulmonic valve regurgitation is trivial. No evidence of pulmonic stenosis. Aorta: The aortic root and ascending aorta are structurally normal, with no evidence of dilitation. Venous: The inferior vena cava is dilated in size with less than 50% respiratory variability, suggesting right atrial pressure of 15 mmHg. IAS/Shunts: The interatrial septum was not well visualized.  LEFT VENTRICLE PLAX 2D LVIDd:         5.60 cm LVIDs:         4.65 cm LV PW:         1.20 cm LV IVS:        1.20 cm LVOT diam:     2.30 cm LV SV:         59 LV SV Index:   39 LVOT Area:     4.15 cm  LV Volumes (MOD) LV vol d, MOD A2C: 190.0 ml LV vol d, MOD A4C: 156.0 ml LV vol s, MOD A2C: 101.0 ml LV vol s, MOD A4C: 74.0 ml LV SV MOD A2C:     89.0 ml LV SV MOD A4C:     156.0 ml LV SV MOD BP:      85.5 ml RIGHT VENTRICLE            IVC RV S prime:     7.51 cm/s  IVC diam: 2.40 cm TAPSE (M-mode): 0.9 cm LEFT ATRIUM             Index        RIGHT ATRIUM           Index LA diam:        4.45 cm 2.93  cm/m   RA Area:     34.40 cm LA Vol (A2C):   96.3 ml 63.39 ml/m  RA Volume:   148.00 ml 97.41 ml/m LA Vol (A4C):   46.3 ml 30.47 ml/m LA Biplane Vol: 68.8 ml 45.28 ml/m  AORTIC VALVE AV Area (Vmax):    1.15 cm AV Area (Vmean):   1.14 cm AV Area (VTI):     1.20 cm AV Vmax:           297.00 cm/s AV Vmean:          189.333 cm/s AV VTI:            0.490 m AV Peak Grad:      35.3 mmHg AV Mean Grad:      21.0 mmHg LVOT Vmax:         81.85 cm/s LVOT Vmean:        51.950 cm/s LVOT VTI:          0.141 m LVOT/AV VTI ratio: 0.29  AORTA Ao Root diam: 3.40 cm Ao Asc diam:  3.60 cm MITRAL VALVE                TRICUSPID VALVE MV Area (PHT): 3.62 cm     TR Peak grad:   46.0 mmHg MV Area VTI:   1.93 cm     TR Vmax:        339.00 cm/s MV Peak grad:   10.9 mmHg MV Mean grad:  3.0 mmHg     SHUNTS MV Vmax:       1.65 m/s     Systemic VTI:  0.14 m MV Vmean:      110.0 cm/s   Systemic Diam: 2.30 cm MV Decel Time: 210 msec MV E velocity: 169.50 cm/s Rudean Haskell MD Electronically signed by Rudean Haskell MD Signature Date/Time: 02/24/2022/4:21:04 PM    Final    DG CHEST PORT 1 VIEW  Result Date: 02/24/2022 CLINICAL DATA:  Hypoxia EXAM: PORTABLE CHEST 1 VIEW COMPARISON:  Portable exam 1236 hours compared to 02/22/2022 FINDINGS: Enlargement of cardiac silhouette with pulmonary vascular congestion. Atherosclerotic calcification aorta. Scattered interstitial infiltrate, improved since previous exam, likely reflecting mild pulmonary edema, improved since previous exam. No segmental consolidation, pleural effusion, or pneumothorax. IMPRESSION: Improved pulmonary edema. Electronically Signed   By: Lavonia Dana M.D.   On: 02/24/2022 13:22      Scheduled Meds:  amLODipine  10 mg Oral Daily   baclofen  5 mg Oral TID   donepezil  10 mg Oral QHS   DULoxetine  30 mg Oral Daily   fentaNYL  1 patch Transdermal Q72H   furosemide  40 mg Intravenous Q6H   gabapentin  900 mg Oral TID   guaiFENesin  600 mg Oral BID   insulin aspart  0-15 Units Subcutaneous TID WC   insulin aspart  0-5 Units Subcutaneous QHS   ipratropium-albuterol  3 mL Nebulization TID   mometasone-formoterol  2 puff Inhalation BID   tamsulosin  0.4 mg Oral Daily   warfarin  4 mg Oral ONCE-1600   Warfarin - Pharmacist Dosing Inpatient   Does not apply q1600   Continuous Infusions:  azithromycin Stopped (02/25/22 1831)   cefTRIAXone (ROCEPHIN)  IV Stopped (02/25/22 1550)     LOS: 2 days   Time spent: 35 minutes   Dessa Phi, DO [facility] 02/26/2022, 10:49 AM   Available via Epic secure chat 7am-7pm After these hours, please refer to coverage provider listed on [email or web address]

## 2022-02-26 NOTE — Progress Notes (Signed)
Notified Remi Deter, patient's daughter, of the room patient was transported to in Step-down ICU. Gave number to her for ICU so she could call the RN if needed.

## 2022-02-26 NOTE — Progress Notes (Signed)
[name] for Warfarin Indication: paroxysmal atrial fibrillation, hx of DVT, PVD  Allergies  Allergen Reactions   Ace Inhibitors     hypotension   Dilaudid [Hydromorphone Hcl]     "Mean"   Metformin And Related     GI upset   Morphine And Related Other (See Comments)    "mean"   Pletal [Cilostazol]     SOB    Patient Measurements: Height: 4' (121.9 cm) Weight: 81.8 kg (180 lb 5.4 oz) IBW/kg (Calculated) : 22.4   Vital Signs: Temp: 98.6 F (37 C) (02/22 0819) Temp Source: Oral (02/22 0819) BP: 189/82 (02/22 0819) Pulse Rate: 95 (02/22 0819)  Labs: Recent Labs    02/17/2022 1330 02/19/2022 1541 02/24/22 0743 02/25/22 0607 02/26/22 0636  HGB 11.8*  --  11.6*  --   --   HCT 39.0  --  37.3*  --   --   PLT 365  --  338  --   --   LABPROT 21.7*  --  20.3* 22.4* 26.6*  INR 1.9*  --  1.8* 2.0* 2.5*  CREATININE 0.75  --  0.73  --  0.88  TROPONINIHS 114* 116*  --   --   --      Estimated Creatinine Clearance: 40.8 mL/min (by C-G formula based on SCr of 0.88 mg/dL).   Medical History: Past Medical History:  Diagnosis Date   Anxiety    Avascular necrosis (HCC)    BPH (benign prostatic hyperplasia)    COPD (chronic obstructive pulmonary disease) (HCC)    Depression    Diabetes mellitus    DVT (deep venous thrombosis) (HCC)    GERD (gastroesophageal reflux disease)    History of leg amputation (HCC)    both legs   Hypertension    Neuropathy    Peripheral vascular disease (HCC)    Sleep apnea     Medications:  Spoke to patient's daughters and son. Patient's usual warfarin regimen is 79m daily except 219mon Sundays. However, INR check on 2/15 was supratherapeutic at 4, and patient was instructed to take 70m35mn 2/15-2/18 and then resume usual dosing today. Last dose (70mg68maken PTA was 2/18 at 1800.   Assessment: 86 y37M with PMH of recent diagnosis of lung cancer, PAF, DVT, PVD s/p bilateral BKA on chronic warfarin therapy  presenting with arm swelling and weakness. Bilateral upper extremity Doppler negative for DVT. Patient found to have CAP and started on Ceftriaxone/Azithromycin. Pharmacy consulted for warfarin dosing while patient admitted.   Today, 02/26/22: INR therapeutic, rose from 2 to 2.5 CBC (2/20): Hgb 11.6, Plt WNL Note that broad spectrum antibiotics can increase INR  Goal of Therapy:  INR 2-3 Monitor platelets by anticoagulation protocol: Yes   Plan:  Repeat warfarin 4mg 1mx 1 today as per home regimen Daily PT/INR Monitor CBC and for s/sx of bleeding   JustiAdrian SaranrmD, BCPS Secure Chat if ?s 02/26/2022 9:42 AM

## 2022-02-26 NOTE — Progress Notes (Addendum)
       CROSS COVER NOTE  NAME: Kasean Matczak MRN: [id number] DOB : [birth year]    Date of Service   02/26/2022   Smaran Bee is a 87 y.o. male with PMHx significant for peripheral vascular disease resulting in bilateral AKA, non-insulin-dependent type 2 diabetes, paroxysmal atrial fibrillation on Coumadin, chronic hypoxemia respiratory failure, recent diagnosis of untreated lung cancer on home hospice.  Patient was admitted with worsening weakness, bilateral arm swelling and inability to be cared for at home.    During this hospitalization, patient has been receiving treatment for acute systolic CHF and CAP.  Patient has been receiving Lasix and IV antibiotic treatment.     HPI/Events of Note   2020- Notified by RN of hypoxic episode.   Patient on 6 L HFNC with O2 sat 79%.  Tachypneic.  Currently oriented only to self and place.  Does not appear to be in distress at this time. Patient unable to stay awake long enough to follow commands or keep up with conversation.  Mentation appears to be similar to that during daytime per nursing and family.    Most significant change is his hypoxic state, patient requiring 15 L nonrebreather to maintain optimal oxygen saturation.  Airway patent and protected, crackles at bases.  Left lung more diminished than right.  Chest x-ray, ABG, BiPAP, lasix ordered.  Hypoxic episode could be related to worsening acute systolic CHF versus worsening CAP.  Family updated (daughter [name] and son [name]), family would like to change patient's CODE STATUS to DNR/DNI.  Per family, patient would not want to be connected to mechanical ventilator.  However, family would like to trial BiPAP as long as patient is able to tolerate it. Patient will be moved to stepdown for BiPAP therapy.   Interventions/ Plan   Chest x-ray --> near complete opacification of the left hemithorax could be consistent with consolidation and/or effusion. ABG --> 7.52/52/75 BiPAP Lasix        Raenette Rover, DNP, [place]

## 2022-02-26 NOTE — Evaluation (Signed)
Physical Therapy Evaluation Only Patient Details Name: Mark Russell MRN: [id number] DOB: 1935-06-12 Today's Date: 02/26/2022  History of Present Illness  Mark Russell is a 87 y.o. male with recent diagnosis of lung cancer who declined treatment and lives with his son who was admitted to the hospital with worsening weakness, bilateral arm swelling and inability to be cared for at home. Pt admitted with acute systolic heart failure; also found to have pneumonia. [place] negative for BUE DVT. Of note, pt on hospice. PMH: PVD resulting in bilateral AKA, diabetes, paroxysmal afib, HTN, COPD  Clinical Impression  Pt able to waken to state name and being in hospital, difficulty staying awake with therapist using loud voice. Pt from home, able to use slideboard to transfer bed to w/c and then drives electric w/c, but has progressively gotten weaker and with added oxygen concentrator complications. Pt needing total A to reposition in bed with use of bedpad and bed assist. Pt also needing +2 assist to reposition pillows under each arm due to pain and sensitivity. Unable to transfer pt to sitting EOB due to inability to stay awake for prolonged period. Pt noted to have bulge at L clavicle, daughter reports pt's cancer encased the bone and shattered it. Daughter and grandson in room, daughter reports she is unsure of the plan for d/c at this time. Daughter reports family is meeting with MD later today to discuss hospice/palliative care services. Pt with limited participation, feel wouldn't benefit from acute PT services. Please re-consult if needs arise.     Recommendations for follow up therapy are one component of a multi-disciplinary discharge planning process, led by the attending physician.  Recommendations may be updated based on patient status, additional functional criteria and insurance authorization.  Follow Up Recommendations No PT follow up      Assistance Recommended at Discharge Frequent or constant  Supervision/Assistance  Patient can return home with the following       Equipment Recommendations None recommended by PT  Recommendations for Other Services       Functional Status Assessment Patient has had a recent decline in their functional status and demonstrates the ability to make significant improvements in function in a reasonable and predictable amount of time.     Precautions / Restrictions Precautions Precautions: Fall Precaution Comments: bil AKA, BUE swelling with pain/tenderness Restrictions Weight Bearing Restrictions: No      Mobility  Bed Mobility  General bed mobility comments: pt with difficulty staying awake, total A to reposition in bed using bedpad and bed assist; pt needing +2 assist to reposition pillows under BUE due to pain and sensitivity    Transfers   Ambulation/Gait   Stairs   Wheelchair Mobility    Modified Rankin (Stroke Patients Only)       Balance       Pertinent Vitals/Pain Pain Assessment Pain Assessment: Faces Faces Pain Scale: Hurts little more Pain Location: BUE with repositioning pillows Pain Descriptors / Indicators: Grimacing Pain Intervention(s): Limited activity within patient's tolerance, Monitored during session    Home Living Family/patient expects to be discharged to:: Unsure  Additional Comments: Daughter at bedside reports unsure of what the discharge plan is at this moment.    Prior Function Prior Level of Function : Needs assist  Mobility Comments: daughter reports pt was using slideboard to get to electric w/c and drives w/c around, but became progressively weaker and needing increased assistance ADLs Comments: daughter reports pt progressively weaker and needing increased assistance  Hand Dominance   Dominant Hand: Right    Extremity/Trunk Assessment   Upper Extremity Assessment Upper Extremity Assessment: Defer to OT evaluation    Lower Extremity Assessment Lower Extremity Assessment:  Difficult to assess due to impaired cognition (bil AKA, pt appears to mobilize residual limbs freely while supine)       Communication      Cognition Arousal/Alertness: Lethargic  Overall Cognitive Status: Impaired/Different from baseline  General Comments: Daughter at bedside reports pt more tired today and difficulty staying awake compared to yesterday. Pt wakens to loud voice, able to state name and being in hospital. Pt opens eyes to loud voice but appears to fall back asleep quickly while therapist EOB.        General Comments      Exercises     Assessment/Plan    PT Assessment Patient does not need any further PT services  PT Problem List Decreased strength;Decreased activity tolerance;Decreased balance;Decreased mobility;Decreased cognition;Pain;Cardiopulmonary status limiting activity       PT Treatment Interventions      PT Goals (Current goals can be found in the Care Plan section)  Acute Rehab PT Goals PT Goal Formulation: All assessment and education complete, DC therapy    Frequency       Co-evaluation               AM-PAC PT "6 Clicks" Mobility  Outcome Measure Help needed turning from your back to your side while in a flat bed without using bedrails?: Total Help needed moving from lying on your back to sitting on the side of a flat bed without using bedrails?: Total Help needed moving to and from a bed to a chair (including a wheelchair)?: Total Help needed standing up from a chair using your arms (e.g., wheelchair or bedside chair)?: Total Help needed to walk in hospital room?: Total Help needed climbing 3-5 steps with a railing? : Total 6 Click Score: 6    End of Session Equipment Utilized During Treatment: Oxygen Activity Tolerance: Patient limited by lethargy;Patient limited by pain Patient left: in bed;with call bell/phone within reach;with bed alarm set;with family/visitor present Nurse Communication: Mobility status PT Visit Diagnosis:  Muscle weakness (generalized) (M62.81);Adult, failure to thrive (R62.7)    Time: [id number] PT Time Calculation (min) (ACUTE ONLY): 20 min   Charges:   PT Evaluation $PT Eval Moderate Complexity: 1 Mod           Tori Chrisopher Pustejovsky PT, DPT 02/26/22, 12:51 PM

## 2022-02-26 NOTE — Progress Notes (Signed)
[facility] [address] [facility]) Hospital Liaison Note    Mark Russell is an current [name] patient from home with a terminal diagnosis of Unspecified primary malignant neoplasm with secondary neoplasm to the bone and lung. Family called [facility] with patient complaints of increased weakness/swelling to the BUE. An [facility] RN made a visit to assess patient when family requested patient to be transported and evaluated at the hospital. Patient was transported via EMS to the [facility] ED on 2/19 for evaluation. Patient was admitted to the [facility] with weakness as well as for treatment of CAP on 2/19.  Per [place], Dr. Tomasa Hosteller, this is a related admission.    Visited Mark Russell at the bedside. Daughter was present at bedside. Patient was laying in bed, alert but a little sleepy. Report exchanged with RN and MD. He had some decline overnight. Has had increased SOB, his 02 dropped to 85% on 3L/min, so they increased him to 5L/min. Continues on IV antibiotics. Provided support to the family. Daughter reported that they were taking it day by day.    Mark Russell is inpatient appropriate due to requiring IV medications for treatment of community acquired pneumonia.    V/S: 142/60 bp, 98.7 Temp, 78 bpm, 18 RR, 90% on 5L/min via [place]   I/O: 1178.1/600   Labs:  Chloride: 95 (L) Calcium: 8.2 (L) Prothrombin Time: 26.6 (H) INR: 2.5 (H)  Diagnostics:  None today  IV/PRN:    cefTRIAXone (ROCEPHIN) 2 g in sodium chloride 0.9 % 100 mL IVPB Dose: 2 g Freq: Every 24 hours Route: IV x2  azithromycin (ZITHROMAX) 500 mg in sodium chloride 0.9 % 250 mL IVPB Dose: 500 mg Freq: Every 24 hours Route: IV x1  acetaminophen (TYLENOL) tablet 650 mg Dose: 650 mg Freq: Every 6 hours PRN Route: PO x1   fentaNYL (SUBLIMAZE) injection 12.5 mcg Dose: 12.5 mcg Freq: Every 2 hours PRN Route: IV x3  oxyCODONE-acetaminophen (PERCOCET/ROXICET) 5-325 MG per tablet 1-2 tablet Dose: 1-2 tablet Freq: Every 4 hours  PRN Route: PO x1 Problem list: Assessment & Plan:  Principal Problem:   Acute systolic heart failure (HCC) Active Problems:   Diabetes type 2 with atherosclerosis of arteries of extremities (HCC)   Hypoxia   PVD (peripheral vascular disease) (HCC)   S/P AKA (above knee amputation) bilateral (HCC)   COPD (chronic obstructive pulmonary disease) (HCC)   PAF (paroxysmal atrial fibrillation) (HCC)   HTN (hypertension)   CAP (community acquired pneumonia)   Weakness   Hospice care   Chronic respiratory failure with hypoxia (HCC)   Demand ischemia     Acute systolic CHF -EF 35 to [id number] -Increase Lasix to IV, 2 doses today   Community-acquired pneumonia -Continue Rocephin, azithromycin   Chronic hypoxemic respiratory failure -Remains on 4 L nasal cannula O2   Generalized weakness in setting of untreated lung cancer -Currently under home hospice care   Demand ischemia -Troponin trend is flat, 114-116  -Without ACS   Hypertension -Norvasc -Added hydralazine IV as needed   PAD status post bilateral AKA -Gabapentin for neuropathic pain   Paroxysmal A-fib -Currently on Coumadin GOC: Patient is a DNR.     D/C planning: Ongoing.     Family: Daughter present at bedside.     IDT: Updated   Should patient need ambulance transfer - Please use [facility]) as they contract this service for our active hospice patients.    Zigmund Gottron, RN [facility] Liaison  [phone]

## 2022-02-27 DIAGNOSIS — I5021 Acute systolic (congestive) heart failure: Secondary | ICD-10-CM | POA: Diagnosis not present

## 2022-02-27 LAB — URINALYSIS, ROUTINE W REFLEX MICROSCOPIC
Bacteria, UA: NONE SEEN
Bilirubin Urine: NEGATIVE
Glucose, UA: NEGATIVE mg/dL
Hgb urine dipstick: NEGATIVE
Ketones, ur: NEGATIVE mg/dL
Leukocytes,Ua: NEGATIVE
Nitrite: NEGATIVE
Protein, ur: 30 mg/dL — AB
Specific Gravity, Urine: 1.01 (ref 1.005–1.030)
pH: 8 (ref 5.0–8.0)

## 2022-02-27 LAB — GLUCOSE, CAPILLARY
Glucose-Capillary: 208 mg/dL — ABNORMAL HIGH (ref 70–99)
Glucose-Capillary: 128 mg/dL — ABNORMAL HIGH (ref 70–99)

## 2022-02-27 LAB — BASIC METABOLIC PANEL
Anion gap: 14 (ref 5–15)
BUN: 17 mg/dL (ref 8–23)
CO2: 33 mmol/L — ABNORMAL HIGH (ref 22–32)
Calcium: 8.1 mg/dL — ABNORMAL LOW (ref 8.9–10.3)
Chloride: 89 mmol/L — ABNORMAL LOW (ref 98–111)
Creatinine, Ser: 0.77 mg/dL (ref 0.61–1.24)
GFR, Estimated: >60 mL/min (ref >60)
Glucose, Bld: 163 mg/dL — ABNORMAL HIGH (ref 70–99)
Potassium: 2.8 mmol/L — ABNORMAL LOW (ref 3.5–5.1)
Sodium: 136 mmol/L (ref 135–145)

## 2022-02-27 LAB — CBC
HCT: 42.8 % (ref 39.0–52.0)
Hemoglobin: 13.2 g/dL (ref 13.0–17.0)
MCH: 30.6 pg (ref 26.0–34.0)
MCHC: 30.8 g/dL (ref 30.0–36.0)
MCV: 99.3 fL (ref 80.0–100.0)
Platelets: 468 10*3/uL — ABNORMAL HIGH (ref 150–400)
RBC: 4.31 MIL/uL (ref 4.22–5.81)
RDW: 16.6 % — ABNORMAL HIGH (ref 11.5–15.5)
WBC: 12.6 10*3/uL — ABNORMAL HIGH (ref 4.0–10.5)
nRBC: 0 % (ref 0.0–0.2)

## 2022-02-27 LAB — MAGNESIUM: Magnesium: 1.9 mg/dL (ref 1.7–2.4)

## 2022-02-27 LAB — PROTIME-INR
INR: 2.9 — ABNORMAL HIGH (ref 0.8–1.2)
Prothrombin Time: 30.4 seconds — ABNORMAL HIGH (ref 11.4–15.2)

## 2022-02-27 MED ORDER — GLYCOPYRROLATE 0.2 MG/ML IJ SOLN
0.1000 mg | INTRAMUSCULAR | Status: DC | PRN
  Administered 2022-02-27 – 2022-03-01 (×2): 0.1 mg via INTRAVENOUS
  Filled 2022-02-27 (×2): qty 1

## 2022-02-27 MED ORDER — FENTANYL 2500MCG IN NS 250ML (10MCG/ML) PREMIX INFUSION
25.0000 ug/h | INTRAVENOUS | Status: DC
  Administered 2022-02-27: 10 ug/h via INTRAVENOUS
  Filled 2022-02-27 (×2): qty 250

## 2022-02-27 MED ORDER — WARFARIN SODIUM 2 MG PO TABS
2.0000 mg | ORAL_TABLET | Freq: Once | ORAL | Status: DC
  Filled 2022-02-27: qty 1

## 2022-02-27 MED ORDER — FENTANYL BOLUS VIA INFUSION
20.0000 ug | INTRAVENOUS | Status: DC | PRN
  Administered 2022-02-27 (×4): 20 ug via INTRAVENOUS

## 2022-02-27 MED ORDER — SODIUM CHLORIDE 0.9 % IV SOLN: 1.0000 g | INTRAVENOUS | Status: DC

## 2022-02-27 MED ORDER — LABETALOL HCL 5 MG/ML IV SOLN: 10.0000 mg | INTRAVENOUS | Status: DC | PRN

## 2022-02-27 MED ORDER — HALOPERIDOL 1 MG PO TABS
0.5000 mg | ORAL_TABLET | ORAL | Status: DC | PRN
  Administered 2022-02-28: 0.5 mg via ORAL
  Filled 2022-02-27: qty 1

## 2022-02-27 MED ORDER — POTASSIUM CHLORIDE CRYS ER 20 MEQ PO TBCR
40.0000 meq | EXTENDED_RELEASE_TABLET | Freq: Once | ORAL | Status: AC
  Administered 2022-02-27: 40 meq via ORAL
  Filled 2022-02-27: qty 2

## 2022-02-27 MED ORDER — GUAIFENESIN ER 600 MG PO TB12
600.0000 mg | ORAL_TABLET | Freq: Two times a day (BID) | ORAL | Status: DC
  Filled 2022-02-27: qty 1

## 2022-02-27 MED ORDER — FENTANYL BOLUS VIA INFUSION
25.0000 ug | INTRAVENOUS | Status: DC | PRN
  Administered 2022-02-28 – 2022-03-01 (×16): 25 ug via INTRAVENOUS

## 2022-02-27 MED ORDER — HALOPERIDOL LACTATE 2 MG/ML PO CONC: 0.5000 mg | ORAL | Status: DC | PRN

## 2022-02-27 MED ORDER — FUROSEMIDE 10 MG/ML IJ SOLN
40.0000 mg | Freq: Four times a day (QID) | INTRAMUSCULAR | Status: AC
  Administered 2022-02-27 (×2): 40 mg via INTRAVENOUS
  Filled 2022-02-27 (×2): qty 4

## 2022-02-27 MED ORDER — HALOPERIDOL LACTATE 5 MG/ML IJ SOLN: 0.5000 mg | INTRAMUSCULAR | Status: DC | PRN

## 2022-02-27 MED ORDER — SODIUM CHLORIDE 0.9 % IV SOLN
2.0000 g | Freq: Two times a day (BID) | INTRAVENOUS | Status: DC
  Administered 2022-02-27 – 2022-02-28 (×2): 2 g via INTRAVENOUS
  Filled 2022-02-27 (×2): qty 12.5

## 2022-02-27 MED ORDER — POTASSIUM CHLORIDE 10 MEQ/100ML IV SOLN
10.0000 meq | INTRAVENOUS | Status: AC
  Administered 2022-02-27 (×3): 10 meq via INTRAVENOUS
  Filled 2022-02-27 (×3): qty 100

## 2022-02-27 MED ORDER — FENTANYL CITRATE PF 50 MCG/ML IJ SOSY
25.0000 ug | PREFILLED_SYRINGE | INTRAMUSCULAR | Status: DC | PRN
  Administered 2022-02-27: 25 ug via INTRAVENOUS
  Filled 2022-02-27: qty 1

## 2022-02-27 NOTE — Progress Notes (Signed)
Chaplain was referred to pt by ICU secretary while rounding, pt had family member at bedside. They stated pt was "having a rough day and coughing up blood." Declined chaplain visit at this time, will need follow up from chaplain services.    02/27/22 1300  Spiritual Encounters  Type of Visit Follow up  Care provided to: Family;Patient  Referral source Nurse (RN/NT/LPN)  Reason for visit End-of-life  OnCall Visit No  Spiritual Framework  Presenting Themes Significant life change  Community/Connection Family  Intervention Outcomes  Outcomes Declined chaplain visit  [place] Issues Still Outstanding Referring to oncoming chaplain for further support

## 2022-02-27 NOTE — Progress Notes (Addendum)
[facility] [address]  [facility]) Hospital Liaison Note    Mark Russell is an current [place] patient from home with a terminal diagnosis of Unspecified primary malignant neoplasm with secondary neoplasm to the bone and lung. Family called [facility] with patient complaints of increased weakness/swelling to the BUE. An [facility] RN made a visit to assess patient when family requested patient to be transported and evaluated at the hospital. Patient was transported via EMS to the [facility] ED on 2/19 for evaluation. Patient was admitted to the [facility] with weakness as well as for treatment of CAP on 2/19.  Per [place], Dr. Tomasa Hosteller, this is a related admission.     Overnight patient was moved to the SDU for closer monitoring after a hypoxic episode. Checked in with [facility] RN, who stated that patient was agitated earlier today but has settled down since family has been at bedside. Visited Mr. Strasburger in room with daughter Mark Russell at side. Patient was sitting up in bed in NAD with no complaints of discomfort on 15L HFNC. Patient was A/O xs2, following commands and able to communicate needs. Emotionally supported patient and family, encouraging them to verbal feelings and contact [place] for any hospice related concerns or needs. Discussed possible needs upon discharge and confirmed patient wishes of continuing hospice care - family hopes to take patient home with hospice with Liaison  starting conversation of other possible disposition options as well. Family made aware that [facility] will continue to follow patient daily during this hospitalization.     Patient continues to be inpatient appropriate due to requiring IV medications for treatment of CAP.    V/S:  97.7 Temp, 94 HR, 20 RR, 161/62 BP, 93% sats on HFNC O2 at 15L I/O: 120/3125 (-3005)  Labs: WBC 12.6, Plate 468, K 2.8, Chl 89, CO2 33, Cal 8.1, INR 2.9, Glucose 163, ABG on 2/22 (PH 7.52, pCO@ 52, PO2 75)  Diagnostics 02/26/22: DG Chest Xray: New near complete  opacification of the left hemithorax   IV/PRNs: hydrALAZINE (APRESOLINE) injection 10 mg 10 mg, IV, Q4H PRN Given, 10 mg at 02/23 at 0135  acetaminophen (TYLENOL) suppository 650 mg 650 mg, PRN, Q6h Adm '650mg'$  2/22 at 2126  fentaNYL (SUBLIMAZE) injection 12.5 mcg 12.5 mcg, IV, Q2H PRN Given, 12.5 mcg at 02/23 at 0641  ceFEPIme (MAXIPIME) 2 g in sodium chloride 0.9 % 100 mL IVPB 2 g, IV, Q12H  furosemide (LASIX) injection 40 mg 40 mg, IV, Q6H  potassium chloride 10 mEq in 100 mL IVPB 10 mEq, IV, Q1 Hr x 3 Given, 10 mEq at 02/23 0755    MD EPIC Problem list: No new notes for 2/23 at time of visit.Marland Kitchen Ew NNotes for Hospitalist on 2/22 at 2122 state: Patient had a hypoxic episode at 2020 (79%sats on 6L HFNC) with most significant change is his hypoxic state, patient requiring 15 L nonrebreather to maintain optimal oxygen saturation.  Airway patent and protected, crackles at bases.  Left lung more diminished than right.  Chest x-ray, ABG, BiPAP, lasix ordered.  Hypoxic episode could be related to worsening acute systolic CHF versus worsening CAP. Family updated (daughter Mark Russell and son Mark Russell), family would like to change patient's CODE STATUS to DNR/DNI.  Per family, patient would not want to be connected to mechanical ventilator.  However, family would like to trial BiPAP as long as patient is able to tolerate it. Patient will be moved to stepdown for BiPAP therapy. Plan/Intervention: Chest x-ray --> near complete opacification  of the left hemithorax could be consistent with consolidation and/or effusion. ABG --> 7.52/52/75 BiPAP Lasix   GOC: Clear - Patient is a DNR and family confirmed wishes to avoid mechanical ventilation and continued support of hospice.  D/C planning: Ongoing assessment - MD noted that should patient continue to decline - may consider IPU placement.         Please use [facility] for all [facility] patient transportation needs as they contract this service for our active hospice  patients  Family:  Supported family at bedside IDT: Updated [place] team on patient condition  Please reach out with any hospice related concerns or questions,    Gar Ponto, RN [facility] Liaison  (on [place]) [phone]

## 2022-02-27 NOTE — Progress Notes (Signed)
Patient agitated and pulling off BiPAP mask. RN educated patient on importance of wearing the BiPAP and he stated that he "could not breathe with the mask on". NP notified and gave the okay to give patient a break from the BiPAP. RN placed patient on 15 L [place] high flow nasal cannula. Patient stable with O2 saturations currently at 97%.

## 2022-02-27 NOTE — Progress Notes (Signed)
[place] for Warfarin Indication: paroxysmal atrial fibrillation, hx of DVT, PVD  Allergies  Allergen Reactions   Ace Inhibitors     hypotension   Dilaudid [Hydromorphone Hcl]     "Mean"   Metformin And Related     GI upset   Morphine And Related Other (See Comments)    "mean"   Pletal [Cilostazol]     SOB    Patient Measurements: Height: 4' (121.9 cm) Weight: 98.3 kg (216 lb 11.4 oz) IBW/kg (Calculated) : 22.4   Vital Signs: Temp: 97.7 F (36.5 C) (02/23 0309) Temp Source: Oral (02/23 0309) BP: 161/62 (02/23 0451) Pulse Rate: 94 (02/23 0451)  Labs: Recent Labs    02/24/22 0743 02/25/22 0607 02/26/22 0636 02/27/22 0252  HGB 11.6*  --   --  13.2  HCT 37.3*  --   --  42.8  PLT 338  --   --  468*  LABPROT 20.3* 22.4* 26.6* 30.4*  INR 1.8* 2.0* 2.5* 2.9*  CREATININE 0.73  --  0.88 0.77     Estimated Creatinine Clearance: 51.3 mL/min (by C-G formula based on SCr of 0.77 mg/dL).  Medications:  Spoke to patient's daughters and son. Patient's usual warfarin regimen is '4mg'$  daily except '2mg'$  on Sundays. However, INR check on 2/15 was supratherapeutic at 4, and patient was instructed to take '2mg'$  on 2/15-2/18 and then resume usual dosing today. Last dose ('2mg'$ ) taken PTA was 2/18 at 1800.   Assessment: 20 y/oM with PMH of recent diagnosis of lung cancer, PAF, DVT, PVD s/p bilateral BKA on chronic warfarin therapy presenting with arm swelling and weakness. Bilateral upper extremity Doppler negative for DVT. Patient found to have CAP and started on Ceftriaxone/Azithromycin. Pharmacy consulted for warfarin dosing while patient admitted.   Today, 02/27/22: INR therapeutic but trending up (2.9) CBC: Hgb improved 13.2, Plt WNL Note that broad spectrum antibiotics can increase INR  Goal of Therapy:  INR 2-3 Monitor platelets by anticoagulation protocol: Yes   Plan:  Decrease warfarin '2mg'$  PO x 1 at 16:00 today Daily PT/INR Monitor  CBC and for s/sx of bleeding   Peggyann Juba, PharmD, BCPS Pharmacy: [phone] 02/27/2022 7:12 AM

## 2022-02-27 NOTE — Progress Notes (Signed)
  PROGRESS NOTE  Patient continues to decline.  Patient was reevaluated by RN, patient's daughter Juliann Pulse and son [name].  Patient's mentation is altered, he did not recognize his daughter at bedside earlier.  Now coughing up dark red/brown sputum.  He is oriented to self and [facility] long hospital, but not to situation.  Tells me that he is here because he had a stroke.  He has no physical complaints, denies being short of breath despite being on 15 L high flow oxygen.  He continues to state that he wants to go home.  Appears to be uncomfortable.   Had a discussion with Juliann Pulse and [name] in patient's room.  They are clear in patient's goals that they do not want to prolong suffering.  They want to keep him as comfortable as possible.  We discussed comfort care and what that may mean.  At this point, he is unstable to transfer home or to residential hospice.  Anticipate in-hospital death in hours to days.  Will add orders to reflect comfort measures and monitor closely.     Dessa Phi, DO [facility] 02/27/2022, 1:03 PM  Available via Epic secure chat 7am-7pm After these hours, please refer to coverage provider listed on [email or web address]

## 2022-02-27 NOTE — Progress Notes (Incomplete)
Chaplain was paged to ICU to support family, pt was being transitioned to [place],     02/27/22 1400  Spiritual Encounters  Type of Visit Follow up  Care provided to: Patient;Family  Conversation partners present during encounter Nurse  Referral source Nurse (RN/NT/LPN)  Reason for visit End-of-life  OnCall Visit No  Spiritual Framework  Presenting Themes Significant life change;Impactful experiences and emotions  Community/Connection Family;Faith community  Patient Stress Factors Health changes  Family Stress Factors Loss  Interventions  Spiritual Care Interventions Made Compassionate presence;Reflective listening;Bereavement/grief support;Prayer;Supported grief process

## 2022-02-27 NOTE — Progress Notes (Signed)
PROGRESS NOTE    Mark Russell  [id number] DOB: 15-Jul-1935 DOA: 02/25/2022 PCP: Collene Leyden, MD     Brief Narrative:  Mark Russell is a 87 y.o. male with medical history significant for peripheral vascular disease resulting in bilateral AKA, non-insulin-dependent type 2 diabetes, paroxysmal atrial fibrillation on Coumadin, recent diagnosis of lung cancer who declined treatment and lives with his son who was admitted to the hospital with worsening weakness, bilateral arm swelling and inability to be cared for at home.  Patient has been on home hospice, history is provided by the patient's daughter who is also closely involved in his care.  She states that up until about 5 days ago, patient was still quite active, he gets around the home with his wheelchair, he is not confused, wears 2 L of oxygen at baseline.  Over the weekend, he started getting more and more weak.  On Friday, he was seen by hospice at home, they started him on low-dose of Xanax.  Also over the weekend, they discovered that his nasal cannula oxygen had malfunction, and there was a small piece of plastic blocking the flow of oxygen.  Patient got progressively weaker over the weekend, they also noticed that he developed worsening bilateral arm swelling.  This past Friday, family and also discussed with hospice and requested that he possibly be transition to inpatient hospice house, however they were told that he does not meet criteria.   In the ER, patient has slightly elevated blood pressure, otherwise stable on 2 L nasal cannula oxygen.  Saturating well, not tachycardic.  Work reveals WBC 10.6, hemoglobin 11.8, calcium 7.5, elevated troponin 114, recheck 116.  INR 1.9.  He was started on empiric antibiotics for community-acquired pneumonia.  New events last 24 hours / Subjective: Overnight, patient had clinical decline with hypoxia.  He was found to be satting 79% on 6 L high flow oxygen.  Repeat chest x-ray revealed left  hemithorax with near complete opacification.  He required up to 15 L nonrebreather mask, was placed on BiPAP and transferred to stepdown unit.  This morning, patient is off BiPAP.  He is alert and conversational and mentation has improved since yesterday.  He wants to go home.  He denies any complaints.  He remains on high flow nasal cannula O2.  Urine output was documented 3 L yesterday.  He also had fevers overnight.  I discussed with son at bedside patient's tenuous status.  We will continue IV Lasix.  He is a poor candidate for thoracentesis due to his Coumadin use.  Continue to monitor urine output.  He is on a fluid restriction diet.  Replace potassium today.  If patient declines further, he will need to be put back on BiPAP.  If he does not tolerate BiPAP, we need to discuss transition to comfort care.  Assessment & Plan:  Principal Problem:   Acute systolic heart failure (HCC) Active Problems:   Diabetes type 2 with atherosclerosis of arteries of extremities (HCC)   Hypoxia   PVD (peripheral vascular disease) (HCC)   S/P AKA (above knee amputation) bilateral (HCC)   COPD (chronic obstructive pulmonary disease) (HCC)   PAF (paroxysmal atrial fibrillation) (HCC)   HTN (hypertension)   CAP (community acquired pneumonia)   Weakness   Hospice care   Chronic respiratory failure with hypoxia ([place])   Demand ischemia   Acute systolic CHF -EF 35 to [id number] -Fluid restriction diet, strict I/Os  -Continue IV Lasix  Sepsis, Community-acquired pneumonia  -Sepsis not  present on admission. Fever on 2/22 with tachycardia  -Continue Rocephin, azithromycin --> due to fever overnight, change antibiotic to cefepime.  Blood cultures are ordered. UA is negative   Acute on chronic hypoxemic respiratory failure -Required BiPAP overnight.  Now on high flow nasal cannula O2  Generalized weakness in setting of untreated lung cancer -Currently under home hospice care  Demand ischemia -Troponin trend  is flat, 114-116  -Without ACS  Hypertension -Norvasc -Added hydralazine IV as needed  PAD status post bilateral AKA -Gabapentin for neuropathic pain  Paroxysmal A-fib -Currently on Coumadin   DVT prophylaxis:  warfarin (COUMADIN) tablet 2 mg  Code Status: DNR Family Communication: Son at bedside Disposition Plan:  Status is: Inpatient Remains inpatient appropriate because: CHF and respiratory failure    Antimicrobials:  Anti-infectives (From admission, onward)    Start     Dose/Rate Route Frequency Ordered Stop   02/27/22 0815  ceFEPIme (MAXIPIME) 1 g in sodium chloride 0.9 % 100 mL IVPB  Status:  Discontinued        1 g 200 mL/hr over 30 Minutes Intravenous Every 24 hours 02/27/22 0719 02/27/22 0723   02/27/22 0800  ceFEPIme (MAXIPIME) 2 g in sodium chloride 0.9 % 100 mL IVPB        2 g 200 mL/hr over 30 Minutes Intravenous Every 12 hours 02/27/22 0723     02/24/22 1700  azithromycin (ZITHROMAX) 500 mg in sodium chloride 0.9 % 250 mL IVPB  Status:  Discontinued        500 mg 250 mL/hr over 60 Minutes Intravenous Every 24 hours 02/22/2022 1831 02/27/22 0719   02/24/22 1600  cefTRIAXone (ROCEPHIN) 1 g in sodium chloride 0.9 % 100 mL IVPB  Status:  Discontinued        1 g 200 mL/hr over 30 Minutes Intravenous Every 24 hours 02/18/2022 1831 02/24/22 1143   02/24/22 1600  cefTRIAXone (ROCEPHIN) 2 g in sodium chloride 0.9 % 100 mL IVPB  Status:  Discontinued        2 g 200 mL/hr over 30 Minutes Intravenous Every 24 hours 02/24/22 1143 02/27/22 0719   02/19/2022 1600  cefTRIAXone (ROCEPHIN) 1 g in sodium chloride 0.9 % 100 mL IVPB        1 g 200 mL/hr over 30 Minutes Intravenous  Once 03/03/2022 1547 02/07/2022 1716   02/05/2022 1600  azithromycin (ZITHROMAX) 500 mg in sodium chloride 0.9 % 250 mL IVPB        500 mg 250 mL/hr over 60 Minutes Intravenous  Once 02/21/2022 1547 02/25/2022 1830        Objective: Vitals:   02/27/22 0300 02/27/22 0309 02/27/22 0451 02/27/22 0730  BP:  (!) 191/88  (!) 161/62   Pulse: 100 96 94   Resp: '17 18 20   '$ Temp:  97.7 F (36.5 C)  98.4 F (36.9 C)  TempSrc:  Oral  Oral  SpO2: 97% 96% (!) 89% 93%  Weight:      Height:        Intake/Output Summary (Last 24 hours) at 02/27/2022 1053 Last data filed at 02/27/2022 0856 Gross per 24 hour  Intake 668.21 ml  Output 2425 ml  Net -1756.79 ml    Filed Weights   02/27/2022 1233 02/26/22 2121  Weight: 81.8 kg 98.3 kg    Examination:  General exam: Appears calm, more alert and interactive than yesterday  Respiratory system: Comfortable without distress, no accessory muscle use, on high flow O2  Cardiovascular system: RRR. +  edema of arms, similar to yesterday  Central nervous system: Alert  Extremities: Bilat AKA    Data Reviewed: I have personally reviewed following labs and imaging studies  CBC: Recent Labs  Lab 02/07/2022 1330 02/24/22 0743 02/27/22 0252  WBC 10.6* 10.6* 12.6*  NEUTROABS 7.7  --   --   HGB 11.8* 11.6* 13.2  HCT 39.0 37.3* 42.8  MCV 102.9* 102.5* 99.3  PLT 365 338 468*    Basic Metabolic Panel: Recent Labs  Lab 02/06/2022 1330 02/24/22 0743 02/26/22 0636 02/27/22 0252  NA 136 132* 136 136  K 3.5 4.0 4.2 2.8*  CL 96* 92* 95* 89*  CO2 '30 30 27 '$ 33*  GLUCOSE 187* 152* 98 163*  BUN '8 8 17 17  '$ CREATININE 0.75 0.73 0.88 0.77  CALCIUM 7.5* 8.0* 8.2* 8.1*  MG  --   --   --  1.9    GFR: Estimated Creatinine Clearance: 51.3 mL/min (by C-G formula based on SCr of 0.77 mg/dL). Liver Function Tests: Recent Labs  Lab 02/17/2022 1330  AST 32  ALT 67*  ALKPHOS 72  BILITOT 1.0  PROT 6.9  ALBUMIN 3.0*    No results for input(s): "LIPASE", "AMYLASE" in the last 168 hours. Recent Labs  Lab 02/22/2022 1330  AMMONIA 48*    Coagulation Profile: Recent Labs  Lab 02/08/2022 1330 02/24/22 0743 02/25/22 0607 02/26/22 0636 02/27/22 0252  INR 1.9* 1.8* 2.0* 2.5* 2.9*    Cardiac Enzymes: No results for input(s): "CKTOTAL", "CKMB", "CKMBINDEX",  "TROPONINI" in the last 168 hours. BNP (last 3 results) No results for input(s): "PROBNP" in the last 8760 hours. HbA1C: No results for input(s): "HGBA1C" in the last 72 hours.  CBG: Recent Labs  Lab 02/26/22 0726 02/26/22 1130 02/26/22 1549 02/26/22 2217 02/27/22 0814  GLUCAP 109* 119* 153* 175* 208*    Lipid Profile: No results for input(s): "CHOL", "HDL", "LDLCALC", "TRIG", "CHOLHDL", "LDLDIRECT" in the last 72 hours. Thyroid Function Tests: No results for input(s): "TSH", "T4TOTAL", "FREET4", "T3FREE", "THYROIDAB" in the last 72 hours. Anemia Panel: No results for input(s): "VITAMINB12", "FOLATE", "FERRITIN", "TIBC", "IRON", "RETICCTPCT" in the last 72 hours. Sepsis Labs: No results for input(s): "PROCALCITON", "LATICACIDVEN" in the last 168 hours.  No results found for this or any previous visit (from the past 240 hour(s)).    Radiology Studies: DG Chest Port 1 View  Result Date: 02/26/2022 CLINICAL DATA:  Increasing shortness of breath EXAM: PORTABLE CHEST 1 VIEW COMPARISON:  02/24/2022 FINDINGS: Near complete opacification of the left hemithorax is noted consistent with a likely combination of consolidation and effusion. This is new from the prior exam. Right lung remains clear. No bony abnormality is noted. Aortic calcifications are seen. IMPRESSION: New near complete opacification of the left hemithorax as described. Electronically Signed   By: Inez Catalina M.D.   On: 02/26/2022 21:31      Scheduled Meds:  amLODipine  10 mg Oral Daily   baclofen  5 mg Oral TID   Chlorhexidine Gluconate Cloth  6 each Topical Daily   donepezil  10 mg Oral QHS   DULoxetine  30 mg Oral Daily   fentaNYL  1 patch Transdermal Q72H   furosemide  40 mg Intravenous Q6H   gabapentin  900 mg Oral TID   insulin aspart  0-15 Units Subcutaneous TID WC   insulin aspart  0-5 Units Subcutaneous QHS   ipratropium-albuterol  3 mL Nebulization TID   lidocaine  1 patch Transdermal Q24H    mometasone-formoterol  2 puff Inhalation BID   scopolamine  1 patch Transdermal Q72H   tamsulosin  0.4 mg Oral Daily   warfarin  2 mg Oral ONCE-1600   Warfarin - Pharmacist Dosing Inpatient   Does not apply q1600   Continuous Infusions:  ceFEPime (MAXIPIME) IV 2 g (02/27/22 0856)     LOS: 3 days   Time spent: 35 minutes   Dessa Phi, DO [facility] 02/27/2022, 10:53 AM   Available via Epic secure chat 7am-7pm After these hours, please refer to coverage provider listed on [email or web address]

## 2022-02-27 NOTE — Progress Notes (Signed)
PHARMACY NOTE:  ANTIMICROBIAL RENAL DOSAGE ADJUSTMENT  Current antimicrobial regimen includes a mismatch between antimicrobial dosage and estimated renal function.  As per policy approved by the Pharmacy & Therapeutics and Medical Executive Committees, the antimicrobial dosage will be adjusted accordingly.  Current antimicrobial dosage:  cefepime 1 gr IV q24h   Indication: sepsis  Renal Function:  Estimated Creatinine Clearance: 51.3 mL/min (by C-G formula based on SCr of 0.77 mg/dL). '[]'$      On intermittent HD, scheduled: '[]'$      On CRRT    Antimicrobial dosage has been changed to:   - Cefepime 2 gr IV q12h   Additional comments:   Thank you for allowing pharmacy to be a part of this patient's care.   Royetta Asal, PharmD, BCPS 02/27/2022 7:23 AM

## 2022-02-28 DIAGNOSIS — I5021 Acute systolic (congestive) heart failure: Secondary | ICD-10-CM | POA: Diagnosis not present

## 2022-02-28 MED ORDER — GUAIFENESIN ER 600 MG PO TB12: 600.0000 mg | ORAL_TABLET | Freq: Two times a day (BID) | ORAL | Status: DC | PRN

## 2022-02-28 MED ORDER — LORAZEPAM 2 MG/ML PO CONC: 1.0000 mg | Freq: Four times a day (QID) | ORAL | Status: DC | PRN

## 2022-02-28 MED ORDER — LORAZEPAM 2 MG/ML IJ SOLN
1.0000 mg | INTRAMUSCULAR | Status: DC | PRN
  Administered 2022-02-28 – 2022-03-01 (×8): 1 mg via INTRAVENOUS
  Filled 2022-02-28 (×8): qty 1

## 2022-02-28 MED ORDER — FENTANYL BOLUS VIA INFUSION
50.0000 ug | Freq: Once | INTRAVENOUS | Status: AC
  Administered 2022-02-28: 50 ug via INTRAVENOUS
  Filled 2022-02-28: qty 50

## 2022-02-28 NOTE — Progress Notes (Signed)
Patient noted to be uncomfortable and in pain throughout most of shift. Patient complaining of mainly pain to his stumps, particularly the left. Patient alert throughout shift, somewhat disoriented to time and situation. Discussed with Abigail Butts, NP. Fentanyl gtt and boluses adjusted. Some improvement noticed. Will continue titrating patient's O2 as appropriate.

## 2022-02-28 NOTE — Progress Notes (Signed)
       CROSS COVER NOTE  NAME: Yuan Ilgenfritz MRN: [id number] DOB : 1935/08/11    Date of Service   02/28/2022     HPI/Events of Note   RN reports patient complaining of uncontrolled pain despite being on continuous fentanyl gtt.  Continuous drip and bolus dose have been modified.   Interventions/ Plan   Above        Raenette Rover, DNP, Page

## 2022-02-28 NOTE — TOC Progression Note (Signed)
Transition of Care [facility]) - Progression Note    Patient Details  Name: Mark Russell MRN: [id number] Date of Birth: 1935/04/27  Transition of [facility]) CM/SW Contact  Henrietta Dine, RN Phone Number: 02/28/2022, 2:52 PM  Clinical Narrative:    Pt transitioned to comfort care; TOC will follow.        Expected Discharge Plan and Services                                               Social Determinants of Health (SDOH) Interventions SDOH Screenings   Food Insecurity: No Food Insecurity (02/08/2022)  Housing: Low Risk  (02/05/2022)  Transportation Needs: No Transportation Needs (02/28/2022)  Utilities: Not At Risk (02/08/2022)  Tobacco Use: Medium Risk (02/19/2022)    Readmission Risk Interventions     No data to display

## 2022-02-28 NOTE — Progress Notes (Signed)
PROGRESS NOTE    Mark Russell  [id number] DOB: Nov 05, 1935 DOA: 02/19/2022 PCP: Collene Leyden, MD     Brief Narrative:  Mark Russell is a 87 y.o. male with medical history significant for peripheral vascular disease resulting in bilateral AKA, non-insulin-dependent type 2 diabetes, paroxysmal atrial fibrillation on Coumadin, recent diagnosis of lung cancer who declined treatment and lives with his son who was admitted to the hospital with worsening weakness, bilateral arm swelling and inability to be cared for at home.  Patient has been on home hospice, history is provided by the patient's daughter who is also closely involved in his care.  She states that up until about 5 days ago, patient was still quite active, he gets around the home with his wheelchair, he is not confused, wears 2 L of oxygen at baseline.  Over the weekend, he started getting more and more weak.  On Friday, he was seen by hospice at home, they started him on low-dose of Xanax.  Also over the weekend, they discovered that his nasal cannula oxygen had malfunction, and there was a small piece of plastic blocking the flow of oxygen.  Patient got progressively weaker over the weekend, they also noticed that he developed worsening bilateral arm swelling.  This past Friday, family and also discussed with hospice and requested that he possibly be transition to inpatient hospice house, however they were told that he does not meet criteria.   In the ER, patient has slightly elevated blood pressure, otherwise stable on 2 L nasal cannula oxygen.  Saturating well, not tachycardic.  Work reveals WBC 10.6, hemoglobin 11.8, calcium 7.5, elevated troponin 114, recheck 116.  INR 1.9.  He was started on empiric antibiotics for community-acquired pneumonia.  Patient continued to decline despite IV Lasix.  He became hypoxic, required BiPAP for short period of time which was transitioned to high flow nasal cannula O2.  Repeat chest x-ray revealed left  hemithorax with near complete opacification.  He then started to have hemoptysis, Coumadin was discontinued.  After discussion with family, patient was transition to comfort care.  New events last 24 hours / Subjective: Patient evaluated in stepdown unit.  His mentation has been waxing and waning.  He remains comfortable on fentanyl drip.  Assessment & Plan:  Principal Problem:   Acute systolic heart failure (HCC) Active Problems:   Diabetes type 2 with atherosclerosis of arteries of extremities (HCC)   Hypoxia   PVD (peripheral vascular disease) (HCC)   S/P AKA (above knee amputation) bilateral (HCC)   COPD (chronic obstructive pulmonary disease) (HCC)   PAF (paroxysmal atrial fibrillation) (HCC)   HTN (hypertension)   CAP (community acquired pneumonia)   Weakness   Hospice care   Chronic respiratory failure with hypoxia ([place])   Demand ischemia   Acute systolic CHF Sepsis, Community-acquired pneumonia  Acute on chronic hypoxemic respiratory failure Generalized weakness in setting of untreated lung cancer Demand ischemia Hypertension PAD status post bilateral AKA Paroxysmal A-fib  Patient on comfort measures.  On fentanyl drip.  Anticipate in-hospital death, hours to days.   Code Status: DNR Family Communication: Son at bedside Disposition Plan:  Status is: Inpatient Remains inpatient appropriate because: Comfort care    Antimicrobials:  Anti-infectives (From admission, onward)    Start     Dose/Rate Route Frequency Ordered Stop   02/27/22 0815  ceFEPIme (MAXIPIME) 1 g in sodium chloride 0.9 % 100 mL IVPB  Status:  Discontinued        1 g 200  mL/hr over 30 Minutes Intravenous Every 24 hours 02/27/22 0719 02/27/22 0723   02/27/22 0800  ceFEPIme (MAXIPIME) 2 g in sodium chloride 0.9 % 100 mL IVPB  Status:  Discontinued        2 g 200 mL/hr over 30 Minutes Intravenous Every 12 hours 02/27/22 0723 02/28/22 0952   02/24/22 1700  azithromycin (ZITHROMAX) 500 mg in  sodium chloride 0.9 % 250 mL IVPB  Status:  Discontinued        500 mg 250 mL/hr over 60 Minutes Intravenous Every 24 hours 02/06/2022 1831 02/27/22 0719   02/24/22 1600  cefTRIAXone (ROCEPHIN) 1 g in sodium chloride 0.9 % 100 mL IVPB  Status:  Discontinued        1 g 200 mL/hr over 30 Minutes Intravenous Every 24 hours 02/19/2022 1831 02/24/22 1143   02/24/22 1600  cefTRIAXone (ROCEPHIN) 2 g in sodium chloride 0.9 % 100 mL IVPB  Status:  Discontinued        2 g 200 mL/hr over 30 Minutes Intravenous Every 24 hours 02/24/22 1143 02/27/22 0719   02/06/2022 1600  cefTRIAXone (ROCEPHIN) 1 g in sodium chloride 0.9 % 100 mL IVPB        1 g 200 mL/hr over 30 Minutes Intravenous  Once 02/14/2022 1547 02/26/2022 1716   02/08/2022 1600  azithromycin (ZITHROMAX) 500 mg in sodium chloride 0.9 % 250 mL IVPB        500 mg 250 mL/hr over 60 Minutes Intravenous  Once 02/20/2022 1547 02/20/2022 1830        Objective: Vitals:   02/28/22 0145 02/28/22 0300 02/28/22 0400 02/28/22 0800  BP:      Pulse: 71  71 76  Resp: '14  11 19  '$ Temp:  98.2 F (36.8 C)  (!) 96.7 F (35.9 C)  TempSrc:  Oral  Axillary  SpO2: 98%  95% 92%  Weight:      Height:        Intake/Output Summary (Last 24 hours) at 02/28/2022 1157 Last data filed at 02/28/2022 1000 Gross per 24 hour  Intake 177.49 ml  Output 1350 ml  Net -1172.51 ml    Filed Weights   02/24/2022 1233 02/26/22 2121  Weight: 81.8 kg 98.3 kg    Examination:  General exam: Appears calm, alert to voice Respiratory system: Comfortable without distress, no accessory muscle use Cardiovascular system: RRR. +edema of arms Central nervous system: Alert  Extremities: Bilat AKA    Data Reviewed: I have personally reviewed following labs and imaging studies  CBC: Recent Labs  Lab 02/20/2022 1330 02/24/22 0743 02/27/22 0252  WBC 10.6* 10.6* 12.6*  NEUTROABS 7.7  --   --   HGB 11.8* 11.6* 13.2  HCT 39.0 37.3* 42.8  MCV 102.9* 102.5* 99.3  PLT 365 338 468*     Basic Metabolic Panel: Recent Labs  Lab 02/28/2022 1330 02/24/22 0743 02/26/22 0636 02/27/22 0252  NA 136 132* 136 136  K 3.5 4.0 4.2 2.8*  CL 96* 92* 95* 89*  CO2 '30 30 27 '$ 33*  GLUCOSE 187* 152* 98 163*  BUN '8 8 17 17  '$ CREATININE 0.75 0.73 0.88 0.77  CALCIUM 7.5* 8.0* 8.2* 8.1*  MG  --   --   --  1.9    GFR: Estimated Creatinine Clearance: 51.3 mL/min (by C-G formula based on SCr of 0.77 mg/dL). Liver Function Tests: Recent Labs  Lab 02/06/2022 1330  AST 32  ALT 67*  ALKPHOS 72  BILITOT 1.0  PROT  6.9  ALBUMIN 3.0*    No results for input(s): "LIPASE", "AMYLASE" in the last 168 hours. Recent Labs  Lab 02/10/2022 1330  AMMONIA 48*    Coagulation Profile: Recent Labs  Lab 02/08/2022 1330 02/24/22 0743 02/25/22 0607 02/26/22 0636 02/27/22 0252  INR 1.9* 1.8* 2.0* 2.5* 2.9*    Cardiac Enzymes: No results for input(s): "CKTOTAL", "CKMB", "CKMBINDEX", "TROPONINI" in the last 168 hours. BNP (last 3 results) No results for input(s): "PROBNP" in the last 8760 hours. HbA1C: No results for input(s): "HGBA1C" in the last 72 hours.  CBG: Recent Labs  Lab 02/26/22 1130 02/26/22 1549 02/26/22 2217 02/27/22 0814 02/27/22 1223  GLUCAP 119* 153* 175* 208* 128*    Lipid Profile: No results for input(s): "CHOL", "HDL", "LDLCALC", "TRIG", "CHOLHDL", "LDLDIRECT" in the last 72 hours. Thyroid Function Tests: No results for input(s): "TSH", "T4TOTAL", "FREET4", "T3FREE", "THYROIDAB" in the last 72 hours. Anemia Panel: No results for input(s): "VITAMINB12", "FOLATE", "FERRITIN", "TIBC", "IRON", "RETICCTPCT" in the last 72 hours. Sepsis Labs: No results for input(s): "PROCALCITON", "LATICACIDVEN" in the last 168 hours.  Recent Results (from the past 240 hour(s))  Culture, blood (Routine X 2) w Reflex to ID Panel     Status: None (Preliminary result)   Collection Time: 02/27/22  9:03 AM   Specimen: BLOOD  Result Value Ref Range Status   Specimen Description    Final    BLOOD BLOOD RIGHT HAND Performed at [facility], [place] [address] [place], [place] [address]    Special Requests   Final    BOTTLES DRAWN AEROBIC ONLY Blood Culture adequate volume Performed at [place] [address] [place], [place] [address]    Culture   Final    NO GROWTH < 24 HOURS Performed at [place] [address] [place], [place] [address]    Report Status PENDING  Incomplete  Culture, blood (Routine X 2) w Reflex to ID Panel     Status: None (Preliminary result)   Collection Time: 02/27/22 11:37 AM   Specimen: BLOOD LEFT HAND  Result Value Ref Range Status   Specimen Description   Final    BLOOD LEFT HAND Performed at [facility], [place] [address] [place], [place] [address]    Special Requests   Final    BOTTLES DRAWN AEROBIC ONLY Blood Culture results may not be optimal due to an inadequate volume of blood received in culture bottles Performed at [facility] [address], [place], [place] [address]    Culture   Final    NO GROWTH < 24 HOURS Performed at [facility] [address] [place], [place] [address]    Report Status PENDING  Incomplete      Radiology Studies: DG Chest Port 1 View  Result Date: 02/26/2022 CLINICAL DATA:  Increasing shortness of breath EXAM: PORTABLE CHEST 1 VIEW COMPARISON:  02/24/2022 FINDINGS: Near complete opacification of the left hemithorax is noted consistent with a likely combination of consolidation and effusion. This is new from the prior exam. Right lung remains clear. No bony abnormality is noted. Aortic calcifications are seen. IMPRESSION: New near complete opacification of the left hemithorax as described. Electronically Signed   By: Inez Catalina M.D.   On: 02/26/2022 21:31      Scheduled Meds:  baclofen  5 mg Oral TID   DULoxetine  30 mg Oral Daily   gabapentin  900 mg Oral TID  lidocaine  1 patch Transdermal Q24H    scopolamine  1 patch Transdermal Q72H   Continuous Infusions:  fentaNYL infusion INTRAVENOUS 25 mcg/hr (02/28/22 1000)     LOS: 4 days   Time spent: 25 minutes   Dessa Phi, DO [facility] 02/28/2022, 11:57 AM   Available via Epic secure chat 7am-7pm After these hours, please refer to coverage provider listed on [email or web address]

## 2022-02-28 NOTE — Progress Notes (Signed)
[name] [address] [facility]) Hospitalized Hospice patient visit  Mark Russell is an current [place] patient from home with a terminal diagnosis of Unspecified primary malignant neoplasm with secondary neoplasm to the bone and lung. Family called [facility] with patient complaints of increased weakness/swelling to the BUE. An [facility] RN made a visit to assess patient when family requested patient to be transported and evaluated at the hospital. Patient was transported via EMS to the [facility] ED on 2/19 for evaluation. Patient was admitted to the [facility] with weakness as well as for treatment of CAP on 2/19.  Per [place], Dr. Tomasa Hosteller, this is a related admission.   Visited at bedside. Mark Russell was just moved back up to room 1611 to be kept comfortable. His son is at bedside and we discuss current status. Patient is mainly sleeping and is currently on a Fentanyl infusion to keep him comfortable. He does not wake during my visit. We briefly discuss the possibility of him moving to the [facility] but son relays he has talked with MD about this and they don't want to try moving his dad again.  Patient remains inpatient appropriate due to current Fentanyl Infusion  Vital Signs- 96.7/ 76/19    212/92    92% 2L nasal cannula I&O- 125/600 Abnormal Labs- None New  Diagnostics-  None New  IV/PRN Meds- Fentanyl 8mg continuous IV and 242m bolus 10 in last 24 hours  Problem List- Acute systolic CHF Sepsis, Community-acquired pneumonia  Acute on chronic hypoxemic respiratory failure Generalized weakness in setting of untreated lung cancer Demand ischemia Hypertension PAD status post bilateral AKA Paroxysmal A-fib  Discharge Planning- Patient will likely be hospital death.  Family Contact- Talked with son Mark Russell bedside. IDT: updated Goals of Care: Set, per family for patient to be as comfortable as possible.   Thank you for the opportunity to participate in this patient's care,  please don't hesitate to call for any hospice related questions or concerns.  Mark GarnerSN, RNPilgrim's [phone]

## 2022-02-28 NOTE — Plan of Care (Signed)
  Problem: Pain Managment: Goal: General experience of comfort will improve Outcome: Not Applicable  Pt on fent drip/comfort care

## 2022-02-28 NOTE — Progress Notes (Signed)
Patient received from the ICU. Patient appears to be comfortable with the Fentanyl infusion at this moment, will continue to monitor.

## 2022-03-01 DIAGNOSIS — I5021 Acute systolic (congestive) heart failure: Secondary | ICD-10-CM | POA: Diagnosis not present

## 2022-03-02 DIAGNOSIS — J90 Pleural effusion, not elsewhere classified: Secondary | ICD-10-CM | POA: Insufficient documentation

## 2022-03-02 DIAGNOSIS — Z66 Do not resuscitate: Secondary | ICD-10-CM | POA: Insufficient documentation

## 2022-03-05 LAB — CULTURE, BLOOD (ROUTINE X 2)
Culture: NO GROWTH
Culture: NO GROWTH
Special Requests: ADEQUATE

## 2022-03-06 NOTE — Progress Notes (Signed)
Hospitalist notified that pt expired at 2220. 2229 Pt's daughter [name] was notified. Pt's son called to notify that they were deciding if family wanted to visit and will keep staff posted.   2250 CDS notified 2257 pt placement notified.  Line and tubes removed, fentanyl gtt wasted with charge nurse.    Family notified staff that they will not be coming tonight.    [id number] pt transported to morgue.  Pt placement notified

## 2022-03-06 NOTE — Death Summary Note (Addendum)
DEATH SUMMARY   Patient Details  Name: Mark Russell MRN: [id number] DOB: 02-28-1935 [id number], Mark Rankins, Mark Russell Admission/Discharge Information   Admit Date:  03-04-2022  Date of Death: Date of Death: 2022-03-10  Time of Death: Time of Death: 25-Mar-2218  Length of Stay: 5   Principle Cause of death: Acute systolic heart failure   Hospital Diagnoses: Principal Problem:   Acute systolic heart failure ([place]) Active Problems:   Diabetes type 2 with atherosclerosis of arteries of extremities (HCC)   Hypoxia   PVD (peripheral vascular disease) (HCC)   S/P AKA (above knee amputation) bilateral (HCC)   COPD (chronic obstructive pulmonary disease) (HCC)   PAF (paroxysmal atrial fibrillation) (HCC)   HTN (hypertension)   CAP (community acquired pneumonia)   Weakness   Hospice care   Chronic respiratory failure with hypoxia ([place])   Demand ischemia   DNR (do not resuscitate)   Pleural effusion on left   Hospital Course: Mark Russell is a 87 y.o. male with medical history significant for peripheral vascular disease resulting in bilateral AKA, non-insulin-dependent type 2 diabetes, paroxysmal atrial fibrillation on Coumadin, recent diagnosis of lung cancer who declined treatment and lives with his son who was admitted to the hospital with worsening weakness, bilateral arm swelling and inability to be cared for at home.  Patient has been on home hospice, history is provided by the patient's daughter who is also closely involved in his care.  She states that up until about 5 days ago, patient was still quite active, he gets around the home with his wheelchair, he is not confused, wears 2 L of oxygen at baseline.  Over the weekend, he started getting more and more weak.  On Friday, he was seen by hospice at home, they started him on low-dose of Xanax.  Also over the weekend, they discovered that his nasal cannula oxygen had malfunction, and there was a small piece of plastic blocking the flow of oxygen.   Patient got progressively weaker over the weekend, they also noticed that he developed worsening bilateral arm swelling.  This past Friday, family and also discussed with hospice and requested that he possibly be transition to inpatient hospice house, however they were told that he does not meet criteria.    In the ER, patient has slightly elevated blood pressure, otherwise stable on 2 L nasal cannula oxygen.  Saturating well, not tachycardic.  Work reveals WBC 10.6, hemoglobin 11.8, calcium 7.5, elevated troponin 114, recheck 116.  INR 1.9.  He was started on empiric antibiotics for community-acquired pneumonia.   Patient continued to decline despite IV Lasix.  He became hypoxic, required BiPAP for short period of time which was transitioned to high flow nasal cannula O2.  Repeat chest x-ray revealed left hemithorax with near complete opacification.  He then started to have hemoptysis, Coumadin was discontinued.  After discussion with family, patient was transition to comfort care.  The results of significant diagnostics from this hospitalization (including imaging, microbiology, ancillary and laboratory) are listed below for reference.   Significant Diagnostic Studies: DG Chest Port 1 View  Result Date: 02/26/2022 CLINICAL DATA:  Increasing shortness of breath EXAM: PORTABLE CHEST 1 VIEW COMPARISON:  02/24/2022 FINDINGS: Near complete opacification of the left hemithorax is noted consistent with a likely combination of consolidation and effusion. This is new from the prior exam. Right lung remains clear. No bony abnormality is noted. Aortic calcifications are seen. IMPRESSION: New near complete opacification of the left hemithorax as described. Electronically Signed  By: Inez Catalina M.D.   On: 02/26/2022 21:31   ECHOCARDIOGRAM COMPLETE  Result Date: 02/24/2022    ECHOCARDIOGRAM REPORT   Patient Name:   Mark Russell Date of Exam: 02/24/2022 Medical Rec #:  [id number]     Height:       48.0 in  Accession #:    [id number]    Weight:       180.3 lb Date of Birth:  21-Aug-1935     BSA:          1.519 m Patient Age:    84 years      BP:           151/97 mmHg Patient Gender: M             HR:           94 bpm. Exam Location:  Inpatient Procedure: 2D Echo, Cardiac Doppler, Color Doppler and Intracardiac            Opacification Agent Indications:    I50.40* Unspecified combined systolic (congestive) and diastolic                 (congestive) heart failure  History:        Patient has no prior history of Echocardiogram examinations.                 Abnormal ECG, COPD, Arrythmias:Atrial Fibrillation; Risk                 Factors:Diabetes, Sleep Apnea and Hypertension.  Sonographer:    Roseanna Rainbow RDCS Referring Phys: [id number] [facility]  Sonographer Comments: Technically difficult study due to poor echo windows, suboptimal parasternal window, suboptimal apical window, suboptimal subcostal window and patient is obese. Image acquisition challenging due to patient body habitus. Patient could not follow instructions. Could not turn. IMPRESSIONS  1. Left ventricular ejection fraction, by estimation, is 35 to 40%. The left ventricle has moderately decreased function. The left ventricle demonstrates global hypokinesis. There is mild concentric left ventricular hypertrophy. Left ventricular diastolic parameters are indeterminate.  2. Right ventricular systolic function is normal. The right ventricular size is severely enlarged. There is severely elevated pulmonary artery systolic pressure. The estimated right ventricular systolic pressure is 0000000 mmHg.  3. Left atrial size was severely dilated.  4. Right atrial size was moderately dilated.  5. The mitral valve is abnormal. No evidence of mitral valve regurgitation. No evidence of mitral stenosis. The mean mitral valve gradient is 3.0 mmHg.  6. Discrepancy between color flow and spectral Doppler for tricuspid regurgitation assessment; study likely understimate tricuspid  regurgitation.  7. The aortic valve was not well visualized. Aortic valve regurgitation is not visualized. Mild to moderate aortic valve stenosis. Aortic valve mean gradient measures 21.0 mmHg. Aortic valve Vmax measures 2.97 m/s. Mean gradient pre contrast imaging 15 mm Hg.  8. The inferior vena cava is dilated in size with <50% respiratory variability, suggesting right atrial pressure of 15 mmHg. Comparison(s): No prior Echocardiogram. FINDINGS  Left Ventricle: Left ventricular ejection fraction, by estimation, is 35 to 40%. The left ventricle has moderately decreased function. The left ventricle demonstrates global hypokinesis. Definity contrast agent was given IV to delineate the left ventricular endocardial borders. The left ventricular internal cavity size was normal in size. There is mild concentric left ventricular hypertrophy. Left ventricular diastolic parameters are indeterminate. Right Ventricle: The right ventricular size is severely enlarged. Right vetricular wall thickness was not well visualized. Right ventricular systolic function is  normal. There is severely elevated pulmonary artery systolic pressure. The tricuspid regurgitant velocity is 3.39 m/s, and with an assumed right atrial pressure of 15 mmHg, the estimated right ventricular systolic pressure is 0000000 mmHg. Left Atrium: Left atrial size was severely dilated. Right Atrium: Right atrial size was moderately dilated. Pericardium: There is no evidence of pericardial effusion. Mitral Valve: The mitral valve is abnormal. No evidence of mitral valve regurgitation. No evidence of mitral valve stenosis. MV peak gradient, 10.9 mmHg. The mean mitral valve gradient is 3.0 mmHg. Tricuspid Valve: Discrepancy between color flow and spectral Doppler for tricuspid regurgitation assessment; study likely understimate tricuspid regurgitation. The tricuspid valve is not well visualized. Tricuspid valve regurgitation is not demonstrated. Aortic Valve: The aortic  valve was not well visualized. Aortic valve regurgitation is not visualized. Mild to moderate aortic stenosis is present. Aortic valve mean gradient measures 21.0 mmHg. Aortic valve peak gradient measures 35.3 mmHg. Aortic valve area, by VTI measures 1.20 cm. Pulmonic Valve: The pulmonic valve was grossly normal. Pulmonic valve regurgitation is trivial. No evidence of pulmonic stenosis. Aorta: The aortic root and ascending aorta are structurally normal, with no evidence of dilitation. Venous: The inferior vena cava is dilated in size with less than 50% respiratory variability, suggesting right atrial pressure of 15 mmHg. IAS/Shunts: The interatrial septum was not well visualized.  LEFT VENTRICLE PLAX 2D LVIDd:         5.60 cm LVIDs:         4.65 cm LV PW:         1.20 cm LV IVS:        1.20 cm LVOT diam:     2.30 cm LV SV:         59 LV SV Index:   39 LVOT Area:     4.15 cm  LV Volumes (MOD) LV vol d, MOD A2C: 190.0 ml LV vol d, MOD A4C: 156.0 ml LV vol s, MOD A2C: 101.0 ml LV vol s, MOD A4C: 74.0 ml LV SV MOD A2C:     89.0 ml LV SV MOD A4C:     156.0 ml LV SV MOD BP:      85.5 ml RIGHT VENTRICLE            IVC RV S prime:     7.51 cm/s  IVC diam: 2.40 cm TAPSE (M-mode): 0.9 cm LEFT ATRIUM             Index        RIGHT ATRIUM           Index LA diam:        4.45 cm 2.93 cm/m   RA Area:     34.40 cm LA Vol (A2C):   96.3 ml 63.39 ml/m  RA Volume:   148.00 ml 97.41 ml/m LA Vol (A4C):   46.3 ml 30.47 ml/m LA Biplane Vol: 68.8 ml 45.28 ml/m  AORTIC VALVE AV Area (Vmax):    1.15 cm AV Area (Vmean):   1.14 cm AV Area (VTI):     1.20 cm AV Vmax:           297.00 cm/s AV Vmean:          189.333 cm/s AV VTI:            0.490 m AV Peak Grad:      35.3 mmHg AV Mean Grad:      21.0 mmHg LVOT Vmax:         81.85  cm/s LVOT Vmean:        51.950 cm/s LVOT VTI:          0.141 m LVOT/AV VTI ratio: 0.29  AORTA Ao Root diam: 3.40 cm Ao Asc diam:  3.60 cm MITRAL VALVE                TRICUSPID VALVE MV Area (PHT): 3.62 cm      TR Peak grad:   46.0 mmHg MV Area VTI:   1.93 cm     TR Vmax:        339.00 cm/s MV Peak grad:  10.9 mmHg MV Mean grad:  3.0 mmHg     SHUNTS MV Vmax:       1.65 m/s     Systemic VTI:  0.14 m MV Vmean:      110.0 cm/s   Systemic Diam: 2.30 cm MV Decel Time: 210 msec MV E velocity: 169.50 cm/s Rudean Haskell Mark Russell Electronically signed by Rudean Haskell Mark Russell Signature Date/Time: 02/24/2022/4:21:04 PM    Final    DG CHEST PORT 1 VIEW  Result Date: 02/24/2022 CLINICAL DATA:  Hypoxia EXAM: PORTABLE CHEST 1 VIEW COMPARISON:  Portable exam 1236 hours compared to 02/27/2022 FINDINGS: Enlargement of cardiac silhouette with pulmonary vascular congestion. Atherosclerotic calcification aorta. Scattered interstitial infiltrate, improved since previous exam, likely reflecting mild pulmonary edema, improved since previous exam. No segmental consolidation, pleural effusion, or pneumothorax. IMPRESSION: Improved pulmonary edema. Electronically Signed   By: Lavonia Dana M.D.   On: 02/24/2022 13:22   UE VENOUS DUPLEX (7am - 7pm)  Result Date: 02/24/2022 UPPER VENOUS STUDY  Patient Name:  Mark Russell  Date of Exam:   02/11/2022 Medical Rec #: [id number]      Accession #:    [id number] Date of Birth: 1935-08-24      Patient Gender: M Patient Age:   3 years Exam Location:  [facility] Procedure:      VAS [place] UPPER EXTREMITY VENOUS DUPLEX Referring Phys: Lurena Nida --------------------------------------------------------------------------------  Indications: Bilateral arm weakness, swelling/pain Comparison Study: No prior studies. Performing Technologist: Darlin Coco RDMS, RVT  Examination Guidelines: A complete evaluation includes B-mode imaging, spectral Doppler, color Doppler, and power Doppler as needed of all accessible portions of each vessel. Bilateral testing is considered an integral part of a complete examination. Limited examinations for reoccurring indications may be performed as noted.  Right  Findings: +----------+------------+---------+-----------+----------+-------+ RIGHT     CompressiblePhasicitySpontaneousPropertiesSummary +----------+------------+---------+-----------+----------+-------+ IJV           Full       Yes       Yes                      +----------+------------+---------+-----------+----------+-------+ Subclavian               Yes       Yes                      +----------+------------+---------+-----------+----------+-------+ Axillary      Full       Yes       Yes                      +----------+------------+---------+-----------+----------+-------+ Brachial      Full                                          +----------+------------+---------+-----------+----------+-------+  Radial        Full                                          +----------+------------+---------+-----------+----------+-------+ Ulnar         Full                                          +----------+------------+---------+-----------+----------+-------+ Cephalic      Full                                          +----------+------------+---------+-----------+----------+-------+ Basilic       Full                                          +----------+------------+---------+-----------+----------+-------+  Left Findings: +----------+------------+---------+-----------+----------+-------+ LEFT      CompressiblePhasicitySpontaneousPropertiesSummary +----------+------------+---------+-----------+----------+-------+ IJV           Full       Yes       Yes                      +----------+------------+---------+-----------+----------+-------+ Subclavian               Yes       Yes                      +----------+------------+---------+-----------+----------+-------+ Axillary      Full       Yes       Yes                      +----------+------------+---------+-----------+----------+-------+ Brachial      Full                                           +----------+------------+---------+-----------+----------+-------+ Radial        Full                                          +----------+------------+---------+-----------+----------+-------+ Ulnar         Full                                          +----------+------------+---------+-----------+----------+-------+ Cephalic      Full                                          +----------+------------+---------+-----------+----------+-------+ Basilic       Full                                          +----------+------------+---------+-----------+----------+-------+  Summary:  Right: No evidence of deep vein thrombosis in the upper extremity. No evidence of superficial vein thrombosis in the upper extremity.  Left: No evidence of deep vein thrombosis in the upper extremity. No evidence of superficial vein thrombosis in the upper extremity.  *See table(s) above for measurements and observations.  Diagnosing physician: Deitra Mayo Mark Russell Electronically signed by Deitra Mayo Mark Russell on 02/24/2022 at 8:11:24 AM.    Final    CT Head Wo Contrast  Result Date: 02/27/2022 CLINICAL DATA:  Mental status change, unknown cause altered. EXAM: CT HEAD WITHOUT CONTRAST TECHNIQUE: Contiguous axial images were obtained from the base of the skull through the vertex without intravenous contrast. RADIATION DOSE REDUCTION: This exam was performed according to the departmental dose-optimization program which includes automated exposure control, adjustment of the mA and/or kV according to patient size and/or use of iterative reconstruction technique. COMPARISON:  Head CT 09/09/2020 FINDINGS: Brain: There is no evidence of an acute infarct, intracranial hemorrhage, midline shift, or extra-axial fluid collection. A small chronic left frontoparietal infarct at the medial aspect of the central sulcus, a chronic infarct involving the left caudate head and periventricular white matter,  and a small chronic left cerebellar infarct are unchanged. There is mild-to-moderate cerebral atrophy. A 2 x 0.5 cm calcified extra-axial lesion over the left lateral frontal convexity is unchanged and compatible with a meningioma, without associated mass effect or edema. Vascular: Calcified atherosclerosis at the skull base. No hyperdense vessel. Skull: No acute fracture or suspicious osseous lesion. Sinuses/Orbits: The visualized paranasal sinuses and mastoid air cells are clear. Bilateral cataract extraction. Other: None. IMPRESSION: 1. No evidence of acute intracranial abnormality. 2. Chronic infarcts as above. Electronically Signed   By: Logan Bores M.D.   On: 02/22/2022 15:22   DG Chest 2 View  Result Date: 02/15/2022 CLINICAL DATA:  Shortness of breath, confusion. Recent diagnosis of lung cancer. EXAM: CHEST - 2 VIEW COMPARISON:  Chest radiograph 01/11/2022, PET-CT 01/29/2022 FINDINGS: The heart is enlarged, unchanged. The upper mediastinal contours are stable. There is new patchy opacity in the right lower lobe with a small right pleural effusion. There is vascular congestion and mild pulmonary interstitial edema, worsened in the interim. Nodular opacity is again seen in the left upper lobe better seen on prior CT. There is no appreciable pneumothorax. The pathologic fracture of the left clavicle is again noted common better seen on the prior PET-CT. IMPRESSION: 1. New patchy opacities in the right lower lobe may reflect infection in the correct clinical setting. 2. Vascular congestion with mild pulmonary interstitial edema and small right pleural effusion, new since the prior radiograph. Electronically Signed   By: Valetta Mole M.D.   On: 02/17/2022 13:58    Microbiology: Recent Results (from the past 240 hour(s))  Culture, blood (Routine X 2) w Reflex to ID Panel     Status: None (Preliminary result)   Collection Time: 02/27/22  9:03 AM   Specimen: BLOOD  Result Value Ref Range Status    Specimen Description   Final    BLOOD BLOOD RIGHT HAND Performed at [facility], [place] [address], [place], [place] [address]    Special Requests   Final    BOTTLES DRAWN AEROBIC ONLY Blood Culture adequate volume Performed at [facility] [address] [place], [place] [address]    Culture   Final    NO GROWTH 2 DAYS Performed at [facility] [address] [place], [place] [address]  Report Status PENDING  Incomplete  Culture, blood (Routine X 2) w Reflex to ID Panel     Status: None (Preliminary result)   Collection Time: 02/27/22 11:37 AM   Specimen: BLOOD LEFT HAND  Result Value Ref Range Status   Specimen Description   Final    BLOOD LEFT HAND Performed at [facility], [place] [address] [place], [place] [address]    Special Requests   Final    BOTTLES DRAWN AEROBIC ONLY Blood Culture results may not be optimal due to an inadequate volume of blood received in culture bottles Performed at [facility] [address], [place], [place] [address]    Culture   Final    NO GROWTH 2 DAYS Performed at [facility] [address] [place], [place] [address]    Report Status PENDING  Incomplete    Time spent: 25 minutes  Signed: Dessa Phi, DO 03/02/22

## 2022-03-06 NOTE — Progress Notes (Signed)
Daughter, Wynelle Bourgeois, came by and picked up pt's belongings, down in the basement, and confirmed she received it.

## 2022-03-06 NOTE — Progress Notes (Signed)
    OVERNIGHT PROGRESS REPORT  Notified by RN that patient is deceased as of 04/07/18 Hrs  Patient was DNR/Comfort Care  2 RN verified.  Family was notified by RN.     Gershon Cull MSNA ACNPC-AG Acute Care Nurse Practitioner [place]

## 2022-03-06 NOTE — Progress Notes (Addendum)
Manufacturing engineer [facility]) Hospitalized hospice patient visit   Mark Russell is a current Mark Russell patient from home with a terminal diagnosis of Metastatic carcinoma from unknown primary site. Family called [facility] with patient complaints of increased weakness/swelling to the BUE. An [facility] RN visited to assess patient and family requested patient to be evaluated at the hospital. Patient was transported via EMS to the [facility] ED on 2/19 for evaluation. Patient was admitted to [facility] with weakness as well as for treatment of CAP on 2/19.  Per [place], Dr. Tomasa Hosteller, this is a related admission.    Visited patient at bedside and patient receiving patient care and showed no signs of pain/discomfort. Per RN/Cynthia at bedside, patient remains on a Fentanyl drip and appears to manage symptoms well. MSW spoke with spouse/Mark Russell via telephone call. Mark Russell speaks highly of comfort care provided at [facility] and has requested a HD. MSW inquires if family would be interested in transfer to [facility] for EOL care and family declines. Family reports that attending MD/Dr. Maylene Roes has shared that patient is not stable for transport. No additional questions or concerns were shared with MSW.  Patient remains GIP appropriate due to: ongoing Fentanyl infusion.    I&O: Intake/Output Summary (Last 24 hours) at 02/22/2022 1457 Last data filed at 02/28/2022 1100 Gross per 24 hour  Intake --  Output 900 ml  Net -900 ml   Abnormal Labs:  N/A  Diagnostics:  N/A  IV/PRN Meds:  fentaNYL, glycopyrrolate, ipratropium-albuterol, lip balm, LORazepam, ondansetron **OR** ondansetron (ZOFRAN) IVfentaNYL, 25 mcg, Q30 min PRN glycopyrrolate, 0.1 mg, Q4H PRN ipratropium-albuterol, 3 mL, Q4H PRN lip balm, , PRN LORazepam, 1 mg, Q1H PRN ondansetron, 4 mg, Q6H PRN  Or ondansetron (ZOFRAN) IV, 4 mg, Q6H PRN  Problem List:  Principal Problem:   Acute systolic heart failure (HCC) Active Problems:   Diabetes type 2 with  atherosclerosis of arteries of extremities (HCC)   Hypoxia   PVD (peripheral vascular disease) (HCC)   S/P AKA (above knee amputation) bilateral (HCC)   COPD (chronic obstructive pulmonary disease) (HCC)   PAF (paroxysmal atrial fibrillation) (HCC)   HTN (hypertension)   CAP (community acquired pneumonia)   Weakness   Hospice care   Chronic respiratory failure with hypoxia ([place])   Demand ischemia  Discharge Planning: MSW inquired with [facility] if they are interested in [facility] transfer if bed is available. Family declined and are planning for a HD as patient is likely to pass in transport per family discussion w/ provider.   Family Contact:  Spouse/Mark Russell, please see above for details.    IDT:  Updated   Goals of Care:  DNR. Anticipated hospital death.    Thank you for the opportunity to participate in this patient's care, please don't hesitate to call for any hospice related questions or concerns.   Phillis Haggis, MSW [place]

## 2022-03-06 NOTE — Progress Notes (Signed)
PROGRESS NOTE    Mark Russell  [id number] DOB: 04-15-1935 DOA: 03/04/2022 PCP: Collene Leyden, MD     Brief Narrative:  Mark Russell is a 87 y.o. male with medical history significant for peripheral vascular disease resulting in bilateral AKA, non-insulin-dependent type 2 diabetes, paroxysmal atrial fibrillation on Coumadin, recent diagnosis of lung cancer who declined treatment and lives with his son who was admitted to the hospital with worsening weakness, bilateral arm swelling and inability to be cared for at home.  Patient has been on home hospice, history is provided by the patient's daughter who is also closely involved in his care.  She states that up until about 5 days ago, patient was still quite active, he gets around the home with his wheelchair, he is not confused, wears 2 L of oxygen at baseline.  Over the weekend, he started getting more and more weak.  On Friday, he was seen by hospice at home, they started him on low-dose of Xanax.  Also over the weekend, they discovered that his nasal cannula oxygen had malfunction, and there was a small piece of plastic blocking the flow of oxygen.  Patient got progressively weaker over the weekend, they also noticed that he developed worsening bilateral arm swelling.  This past Friday, family and also discussed with hospice and requested that he possibly be transition to inpatient hospice house, however they were told that he does not meet criteria.   In the ER, patient has slightly elevated blood pressure, otherwise stable on 2 L nasal cannula oxygen.  Saturating well, not tachycardic.  Work reveals WBC 10.6, hemoglobin 11.8, calcium 7.5, elevated troponin 114, recheck 116.  INR 1.9.  He was started on empiric antibiotics for community-acquired pneumonia.  Patient continued to decline despite IV Lasix.  He became hypoxic, required BiPAP for short period of time which was transitioned to high flow nasal cannula O2.  Repeat chest x-ray revealed left  hemithorax with near complete opacification.  He then started to have hemoptysis, Coumadin was discontinued.  After discussion with family, patient was transition to comfort care.  New events last 24 hours / Subjective: Sleeping comfortably   Assessment & Plan:  Principal Problem:   Acute systolic heart failure (HCC) Active Problems:   Diabetes type 2 with atherosclerosis of arteries of extremities (HCC)   Hypoxia   PVD (peripheral vascular disease) (HCC)   S/P AKA (above knee amputation) bilateral (HCC)   COPD (chronic obstructive pulmonary disease) (HCC)   PAF (paroxysmal atrial fibrillation) (HCC)   HTN (hypertension)   CAP (community acquired pneumonia)   Weakness   Hospice care   Chronic respiratory failure with hypoxia ([place])   Demand ischemia   Acute systolic CHF Sepsis, Community-acquired pneumonia  Acute on chronic hypoxemic respiratory failure Generalized weakness in setting of untreated lung cancer Demand ischemia Hypertension PAD status post bilateral AKA Paroxysmal A-fib  Patient on comfort measures.  On fentanyl drip.  Anticipate in-hospital death, hours to days.   Code Status: DNR Family Communication: No family at bedside Disposition Plan:  Status is: Inpatient Remains inpatient appropriate because: Comfort care    Antimicrobials:  Anti-infectives (From admission, onward)    Start     Dose/Rate Route Frequency Ordered Stop   02/27/22 0815  ceFEPIme (MAXIPIME) 1 g in sodium chloride 0.9 % 100 mL IVPB  Status:  Discontinued        1 g 200 mL/hr over 30 Minutes Intravenous Every 24 hours 02/27/22 0719 02/27/22 0723   02/27/22 0800  ceFEPIme (MAXIPIME) 2 g in sodium chloride 0.9 % 100 mL IVPB  Status:  Discontinued        2 g 200 mL/hr over 30 Minutes Intravenous Every 12 hours 02/27/22 0723 02/28/22 0952   02/24/22 1700  azithromycin (ZITHROMAX) 500 mg in sodium chloride 0.9 % 250 mL IVPB  Status:  Discontinued        500 mg 250 mL/hr over 60 Minutes  Intravenous Every 24 hours 02/12/2022 1831 02/27/22 0719   02/24/22 1600  cefTRIAXone (ROCEPHIN) 1 g in sodium chloride 0.9 % 100 mL IVPB  Status:  Discontinued        1 g 200 mL/hr over 30 Minutes Intravenous Every 24 hours 02/22/2022 1831 02/24/22 1143   02/24/22 1600  cefTRIAXone (ROCEPHIN) 2 g in sodium chloride 0.9 % 100 mL IVPB  Status:  Discontinued        2 g 200 mL/hr over 30 Minutes Intravenous Every 24 hours 02/24/22 1143 02/27/22 0719   02/28/2022 1600  cefTRIAXone (ROCEPHIN) 1 g in sodium chloride 0.9 % 100 mL IVPB        1 g 200 mL/hr over 30 Minutes Intravenous  Once 02/28/2022 1547 02/27/2022 1716   02/21/2022 1600  azithromycin (ZITHROMAX) 500 mg in sodium chloride 0.9 % 250 mL IVPB        500 mg 250 mL/hr over 60 Minutes Intravenous  Once 02/27/2022 1547 02/06/2022 1830        Objective: Vitals:   02/28/22 0300 02/28/22 0400 02/28/22 0800 02/21/2022 0653  BP:    (!) 195/85  Pulse:  71 76 85  Resp:  '11 19 20  '$ Temp: 98.2 F (36.8 C)  (!) 96.7 F (35.9 C) 99.3 F (37.4 C)  TempSrc: Oral  Axillary Oral  SpO2:  95% 92% (!) 87%  Weight:      Height:        Intake/Output Summary (Last 24 hours) at 02/07/2022 1159 Last data filed at 02/22/2022 0656 Gross per 24 hour  Intake --  Output 600 ml  Net -600 ml    Filed Weights   02/14/2022 1233 02/26/22 2121  Weight: 81.8 kg 98.3 kg    Examination:  Respiratory system: Comfortable without distress, no accessory muscle use Cardiovascular system: RRR. +edema of arms Extremities: Bilat AKA    Data Reviewed: I have personally reviewed following labs and imaging studies  CBC: Recent Labs  Lab 02/05/2022 1330 02/24/22 0743 02/27/22 0252  WBC 10.6* 10.6* 12.6*  NEUTROABS 7.7  --   --   HGB 11.8* 11.6* 13.2  HCT 39.0 37.3* 42.8  MCV 102.9* 102.5* 99.3  PLT 365 338 468*    Basic Metabolic Panel: Recent Labs  Lab 02/13/2022 1330 02/24/22 0743 02/26/22 0636 02/27/22 0252  NA 136 132* 136 136  K 3.5 4.0 4.2 2.8*  CL 96*  92* 95* 89*  CO2 '30 30 27 '$ 33*  GLUCOSE 187* 152* 98 163*  BUN '8 8 17 17  '$ CREATININE 0.75 0.73 0.88 0.77  CALCIUM 7.5* 8.0* 8.2* 8.1*  MG  --   --   --  1.9    GFR: Estimated Creatinine Clearance: 51.3 mL/min (by C-G formula based on SCr of 0.77 mg/dL). Liver Function Tests: Recent Labs  Lab 03/02/2022 1330  AST 32  ALT 67*  ALKPHOS 72  BILITOT 1.0  PROT 6.9  ALBUMIN 3.0*    No results for input(s): "LIPASE", "AMYLASE" in the last 168 hours. Recent Labs  Lab 02/10/2022 1330  AMMONIA 48*    Coagulation Profile: Recent Labs  Lab 02/18/2022 1330 02/24/22 0743 02/25/22 0607 02/26/22 0636 02/27/22 0252  INR 1.9* 1.8* 2.0* 2.5* 2.9*    Cardiac Enzymes: No results for input(s): "CKTOTAL", "CKMB", "CKMBINDEX", "TROPONINI" in the last 168 hours. BNP (last 3 results) No results for input(s): "PROBNP" in the last 8760 hours. HbA1C: No results for input(s): "HGBA1C" in the last 72 hours.  CBG: Recent Labs  Lab 02/26/22 1130 02/26/22 1549 02/26/22 2217 02/27/22 0814 02/27/22 1223  GLUCAP 119* 153* 175* 208* 128*    Lipid Profile: No results for input(s): "CHOL", "HDL", "LDLCALC", "TRIG", "CHOLHDL", "LDLDIRECT" in the last 72 hours. Thyroid Function Tests: No results for input(s): "TSH", "T4TOTAL", "FREET4", "T3FREE", "THYROIDAB" in the last 72 hours. Anemia Panel: No results for input(s): "VITAMINB12", "FOLATE", "FERRITIN", "TIBC", "IRON", "RETICCTPCT" in the last 72 hours. Sepsis Labs: No results for input(s): "PROCALCITON", "LATICACIDVEN" in the last 168 hours.  Recent Results (from the past 240 hour(s))  Culture, blood (Routine X 2) w Reflex to ID Panel     Status: None (Preliminary result)   Collection Time: 02/27/22  9:03 AM   Specimen: BLOOD  Result Value Ref Range Status   Specimen Description   Final    BLOOD BLOOD RIGHT HAND Performed at [facility], [place] [address] [place], [place] [address]    Special Requests   Final    BOTTLES  DRAWN AEROBIC ONLY Blood Culture adequate volume Performed at [facility] [address], [place], [place] [address]    Culture   Final    NO GROWTH 2 DAYS Performed at [facility] [address], [place], [place] [address]    Report Status PENDING  Incomplete  Culture, blood (Routine X 2) w Reflex to ID Panel     Status: None (Preliminary result)   Collection Time: 02/27/22 11:37 AM   Specimen: BLOOD LEFT HAND  Result Value Ref Range Status   Specimen Description   Final    BLOOD LEFT HAND Performed at [facility], [place] [address], [place], [place] [address]    Special Requests   Final    BOTTLES DRAWN AEROBIC ONLY Blood Culture results may not be optimal due to an inadequate volume of blood received in culture bottles Performed at [facility] [address], [place], [place] [address]    Culture   Final    NO GROWTH 2 DAYS Performed at [facility] [address] [place], [place] [address]    Report Status PENDING  Incomplete      Radiology Studies: No results found.    Scheduled Meds:  baclofen  5 mg Oral TID   DULoxetine  30 mg Oral Daily   gabapentin  900 mg Oral TID   lidocaine  1 patch Transdermal Q24H   scopolamine  1 patch Transdermal Q72H   Continuous Infusions:  fentaNYL infusion INTRAVENOUS 25 mcg/hr (02/28/22 1000)     LOS: 5 days   Time spent: 15 minutes   Dessa Phi, DO [facility] 02/11/2022, 11:59 AM   Available via Epic secure chat 7am-7pm After these hours, please refer to coverage provider listed on [email or web address]

## 2022-03-06 DEATH — deceased

## 2022-11-04 IMAGING — DX DG CHEST 1V PORT
2 series · 2 of 2 positions shown · non-contrast
Comparison: 06/01/2020

CLINICAL DATA: Shortness of breath, COVID positive 06/01/2020

EXAM:
PORTABLE CHEST 1 VIEW

[chest ap (1 of 2)]
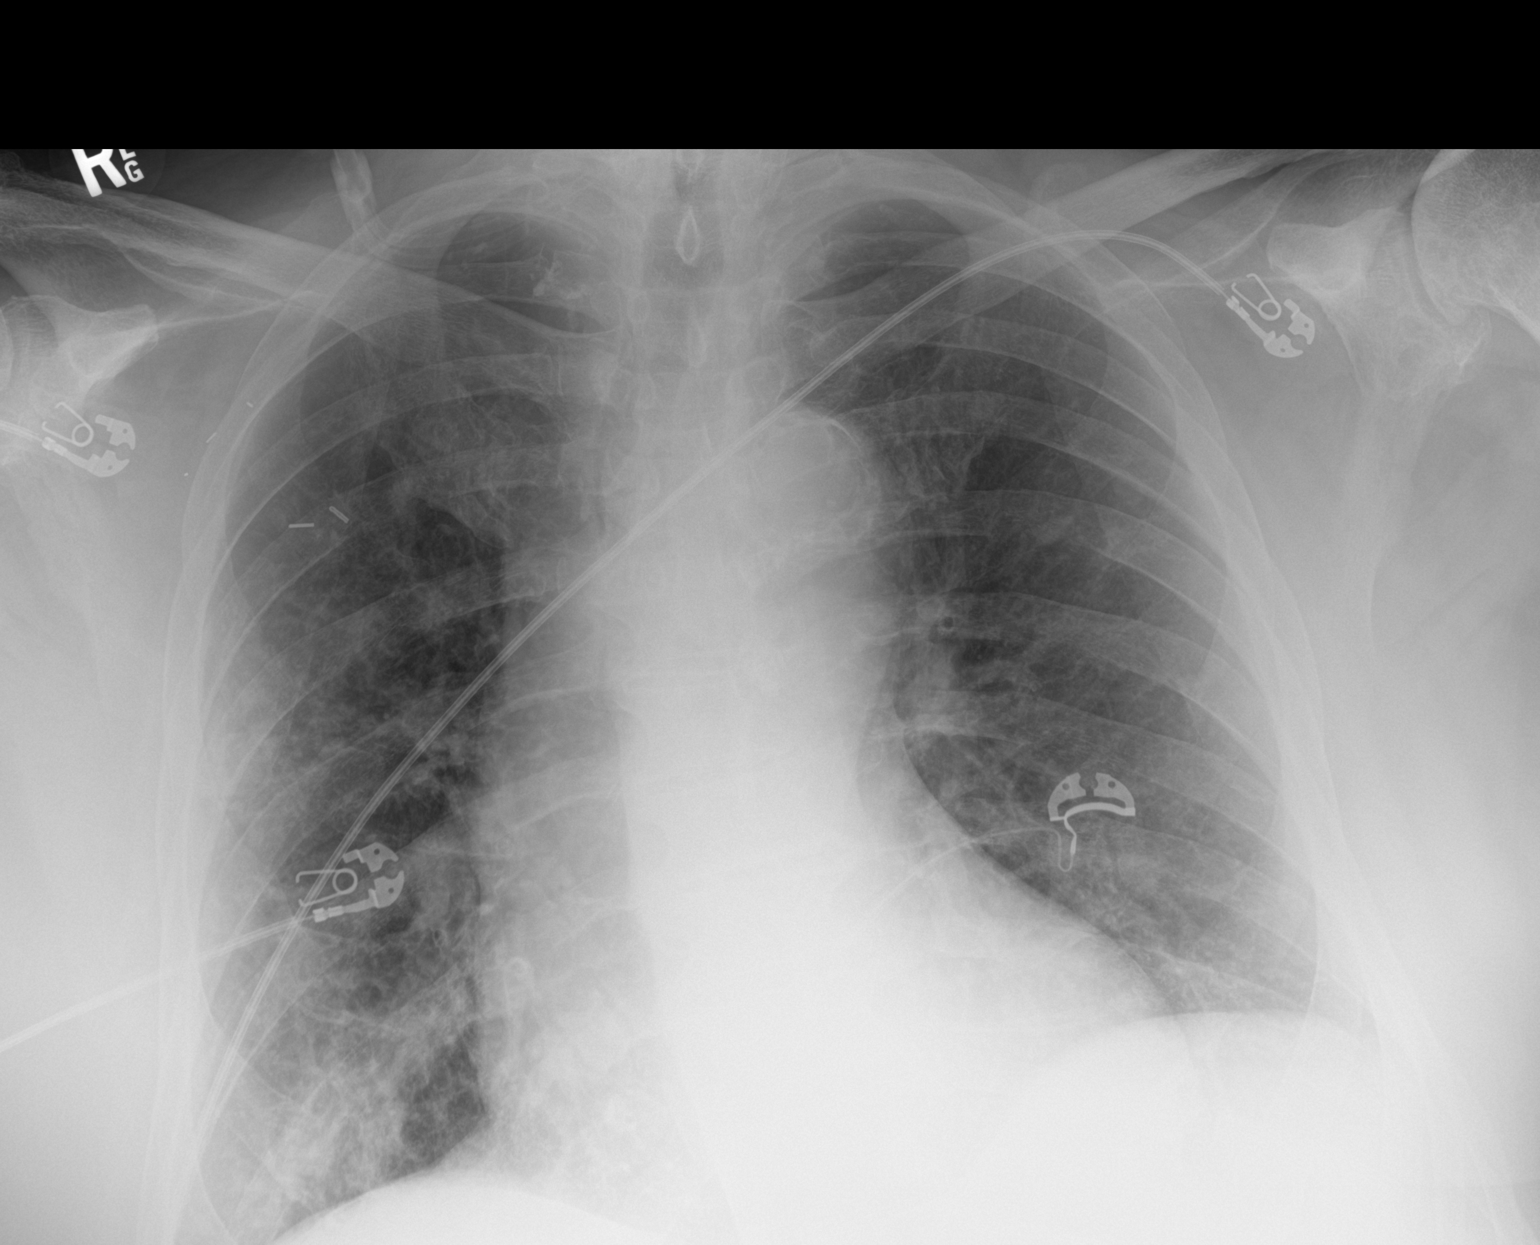

[chest ap (2 of 2)]
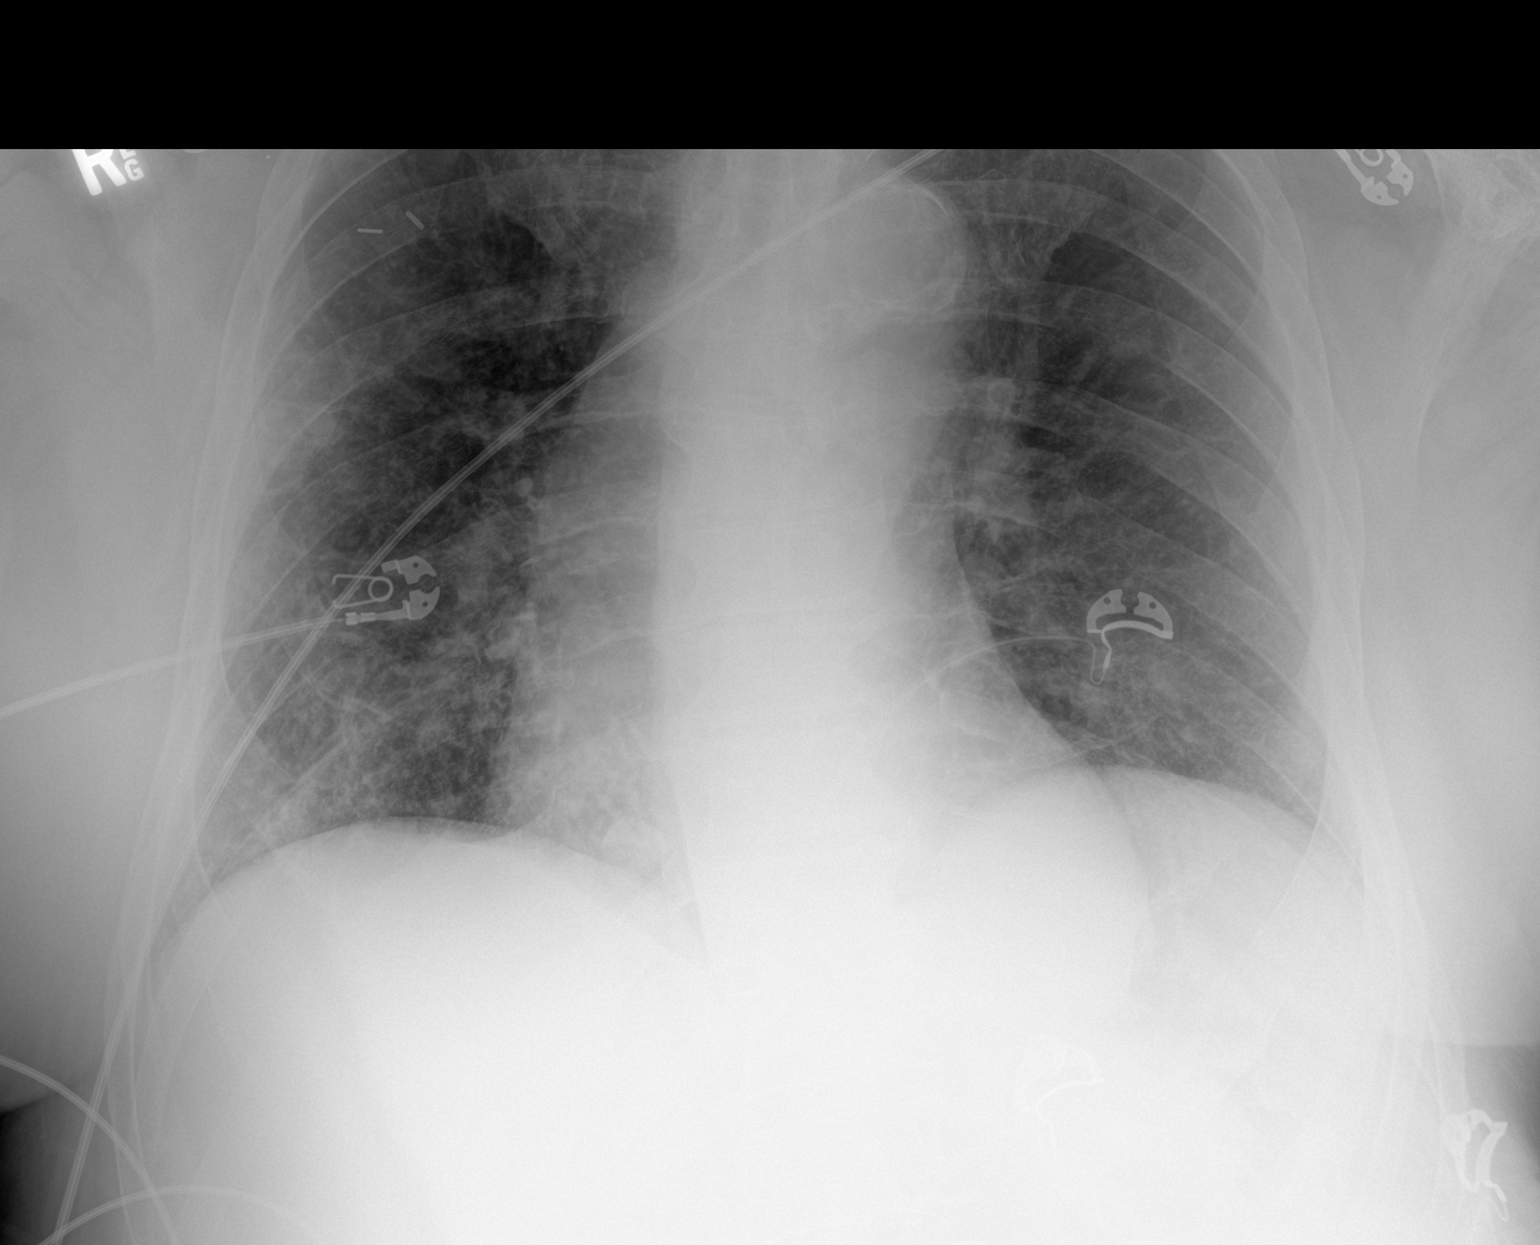

[2 of 2 positions shown; findings below may reference images not displayed]

FINDINGS: Increased mild mid and lower lung mixed interstitial and airspace
opacities, worse on the right compatible with COVID related
pneumonia.

No large effusion or pneumothorax.

Stable cardiomegaly without CHF. Aorta atherosclerotic. Degenerative
changes of the spine and left shoulder.
IMPRESSION: New mild mid and lower lung opacities, worse on the right compatible
with COVID related pneumonia.

Aortic Atherosclerosis ([id number].P).

## 2022-11-12 IMAGING — CT CT HEAD W/O CM
3 series · 15 of 47 positions shown, 18 images · non-contrast
Comparison: 06/06/2020

CLINICAL DATA: Mental status change with unknown cause. Retro
auricular cellulitis on the right

EXAM:
CT HEAD WITHOUT CONTRAST
TECHNIQUE: Contiguous axial images were obtained from the base of the skull
through the vertex without intravenous contrast.

[Series 2: head wo · axial · 0.47mm/px · z∈[-46,+89]mm · 9 of 33 slices shown, 12 images]
[im 3/33  brain]
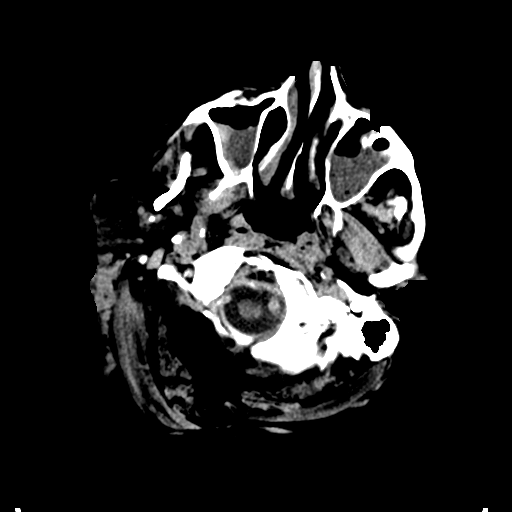
[im 3/33  bone]
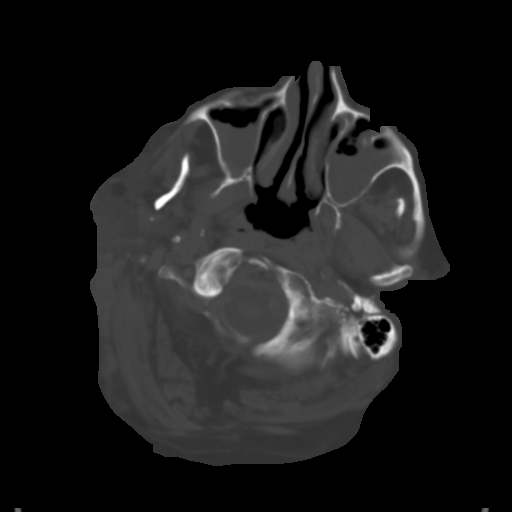
[im 6/33  brain]
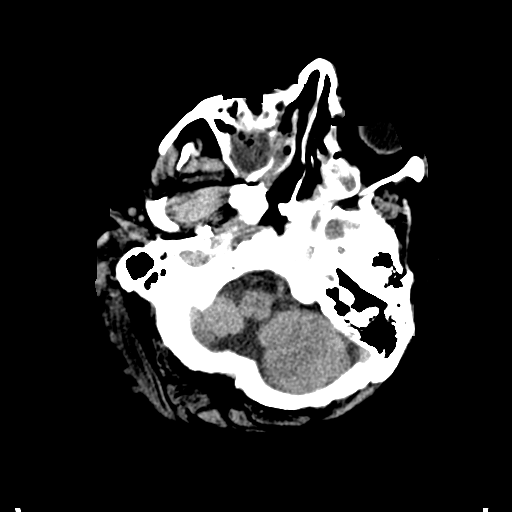
[im 9/33  brain]
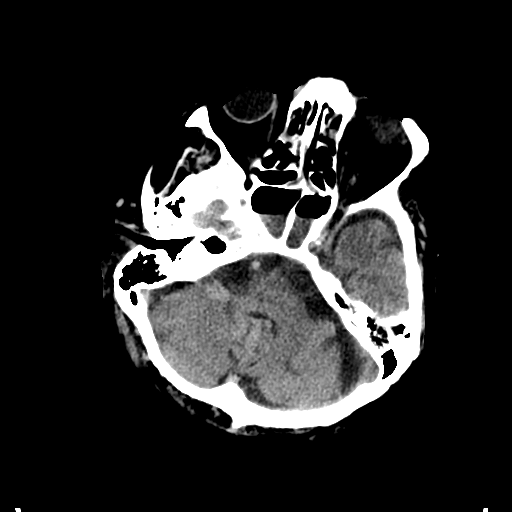
[im 13/33  brain]
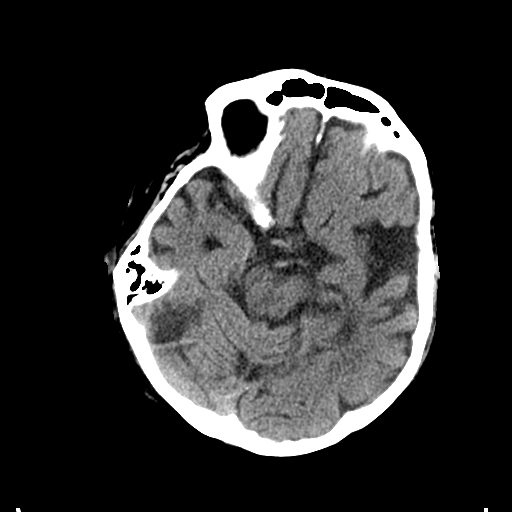
[im 17/33  brain]
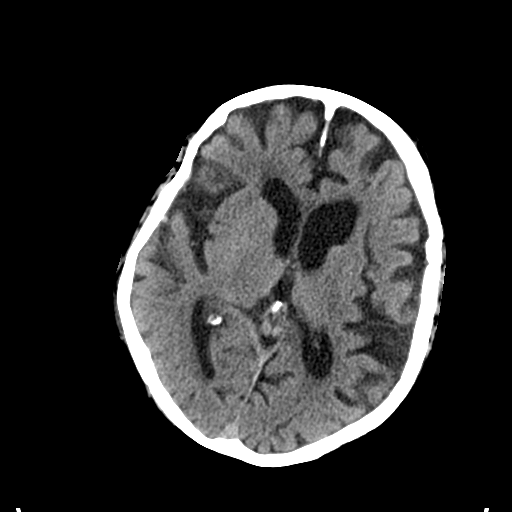
[im 17/33  bone]
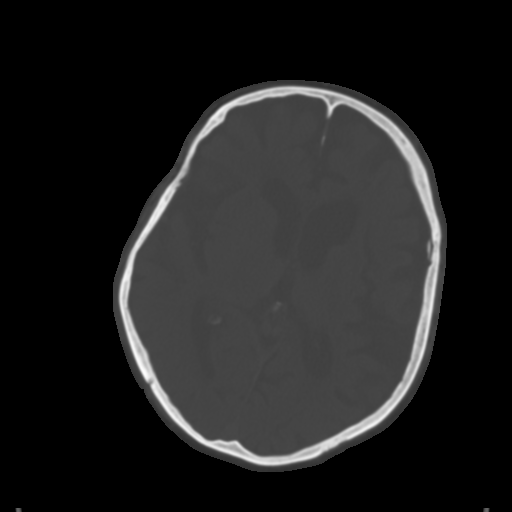
[im 20/33  brain]
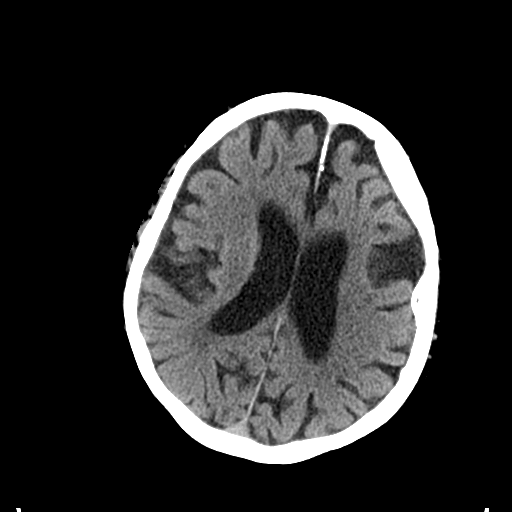
[im 24/33  brain]
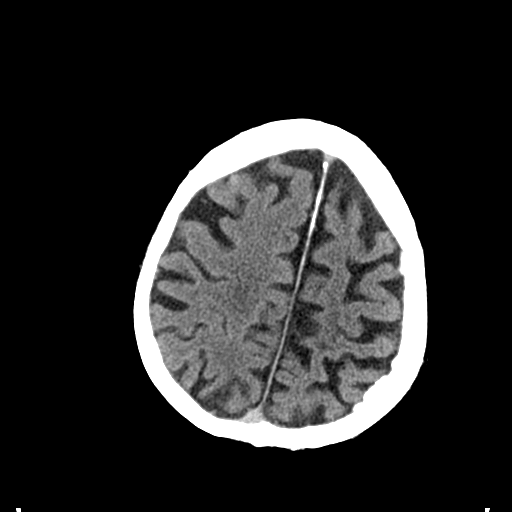
[im 27/33  brain]
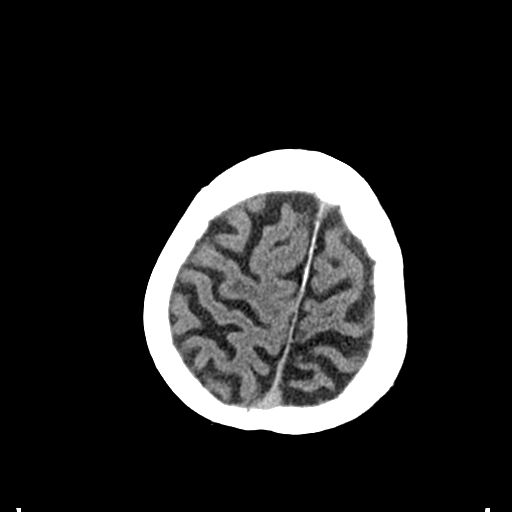
[im 30/33  brain]
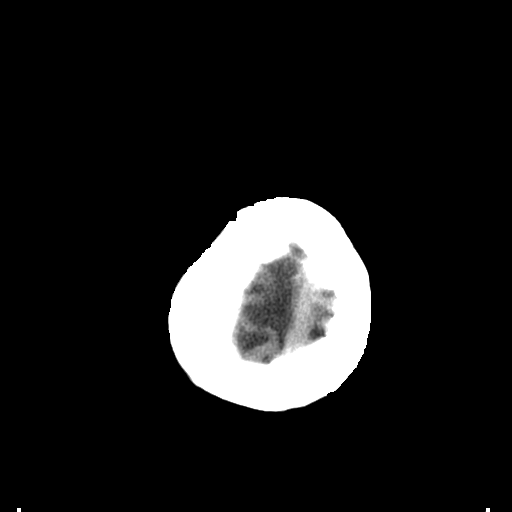
[im 30/33  bone]
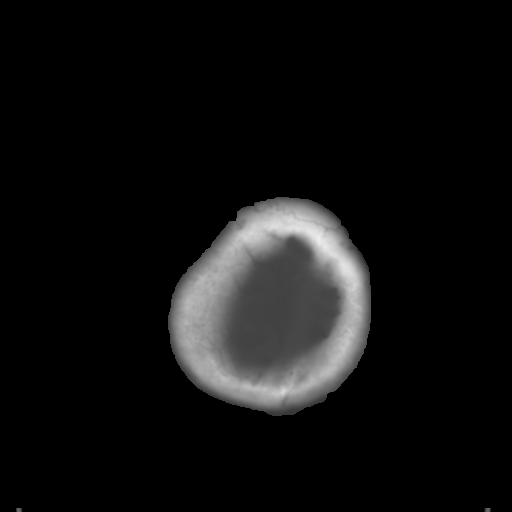

[Series 5: coronal soft tissue · coronal · 0.34mm/px · 3 of 77 slices shown]
[im 26/77  brain]
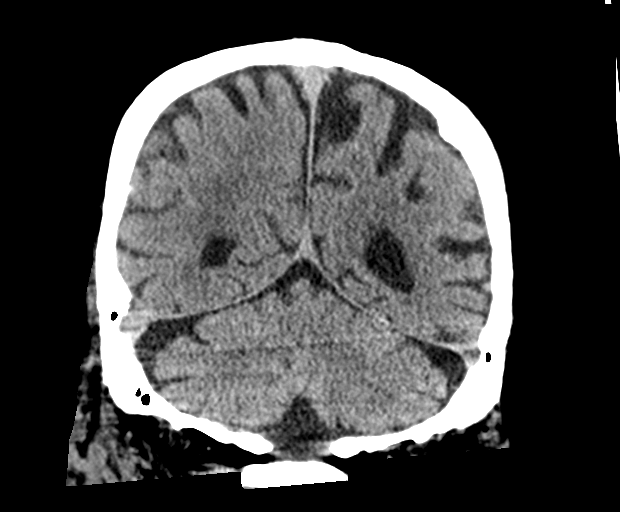
[im 34/77  brain]
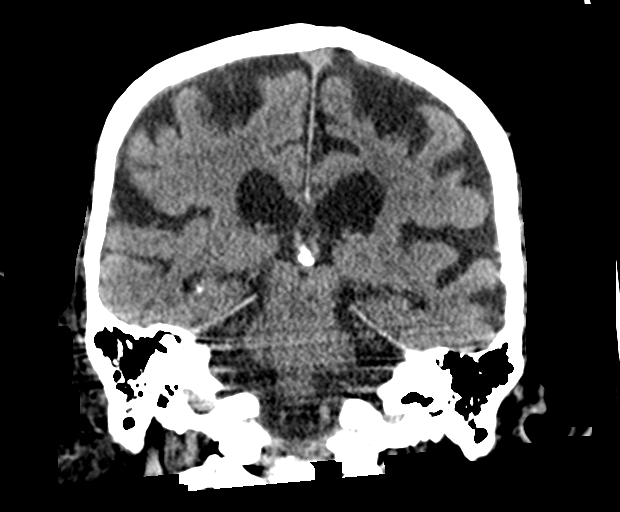
[im 43/77  brain]
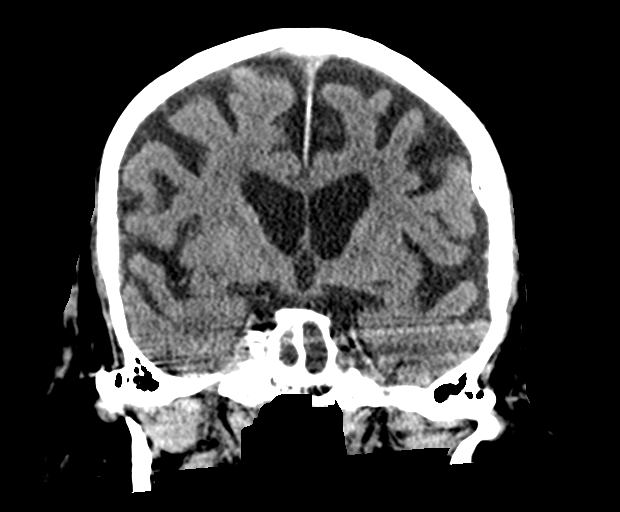

[Series 6: sagittal soft tissue · sagittal · 0.36mm/px · 3 of 63 slices shown]
[im 25/63  brain]
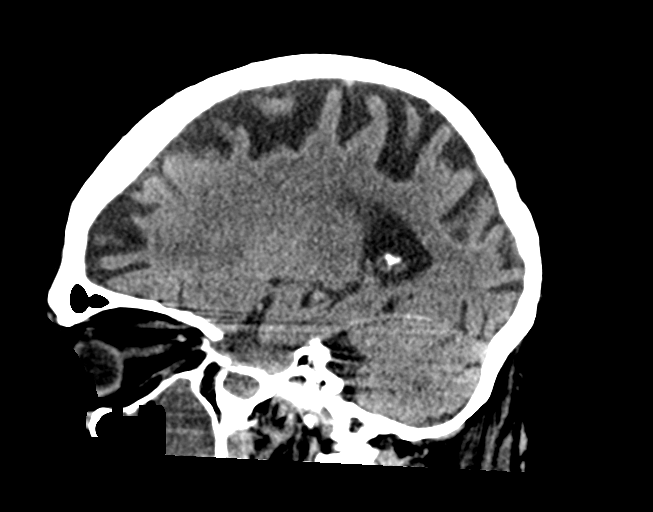
[im 32/63  brain]
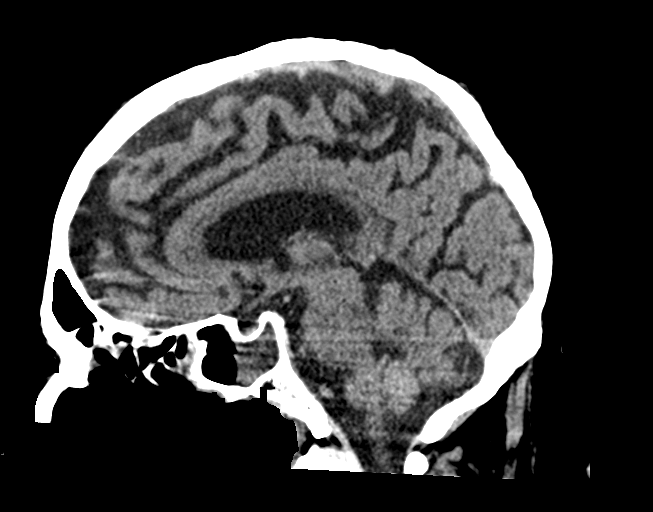
[im 38/63  brain]
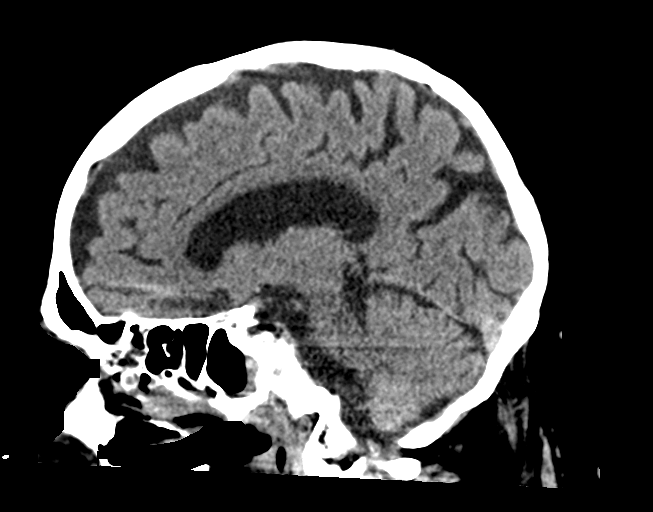

[15 of 47 positions shown; findings below may reference images not displayed]

FINDINGS: Brain: Generalized atrophy. Remote infarct at the high left frontal
parietal junction where there is asymmetric gyral narrowing. Small
remote left cerebellar infarction.

Vascular: No hyperdense vessel or unexpected calcification.

Skull: History of right retroauricular cellulitis correlating with
new subcutaneous reticulation. No discrete rounded area to imply
abscess. No soft tissue gas.

Sinuses/Orbits: Continued sinus inflammation with bilateral
maxillary and sphenoid fluid levels, progressed from prior.
Bilateral cataract resection.
IMPRESSION: 1. No acute intracranial finding.
2. Right retroauricular cellulitis without complicating features.
3. Progressive sinusitis with bilateral fluid level.

## 2023-02-07 IMAGING — DX DG CHEST 1V PORT
1 series · 1 of 1 positions shown · non-contrast
Comparison: 06/14/2020

CLINICAL DATA: Mental status changes and hypoxia.

EXAM:
PORTABLE CHEST 1 VIEW

[chest]
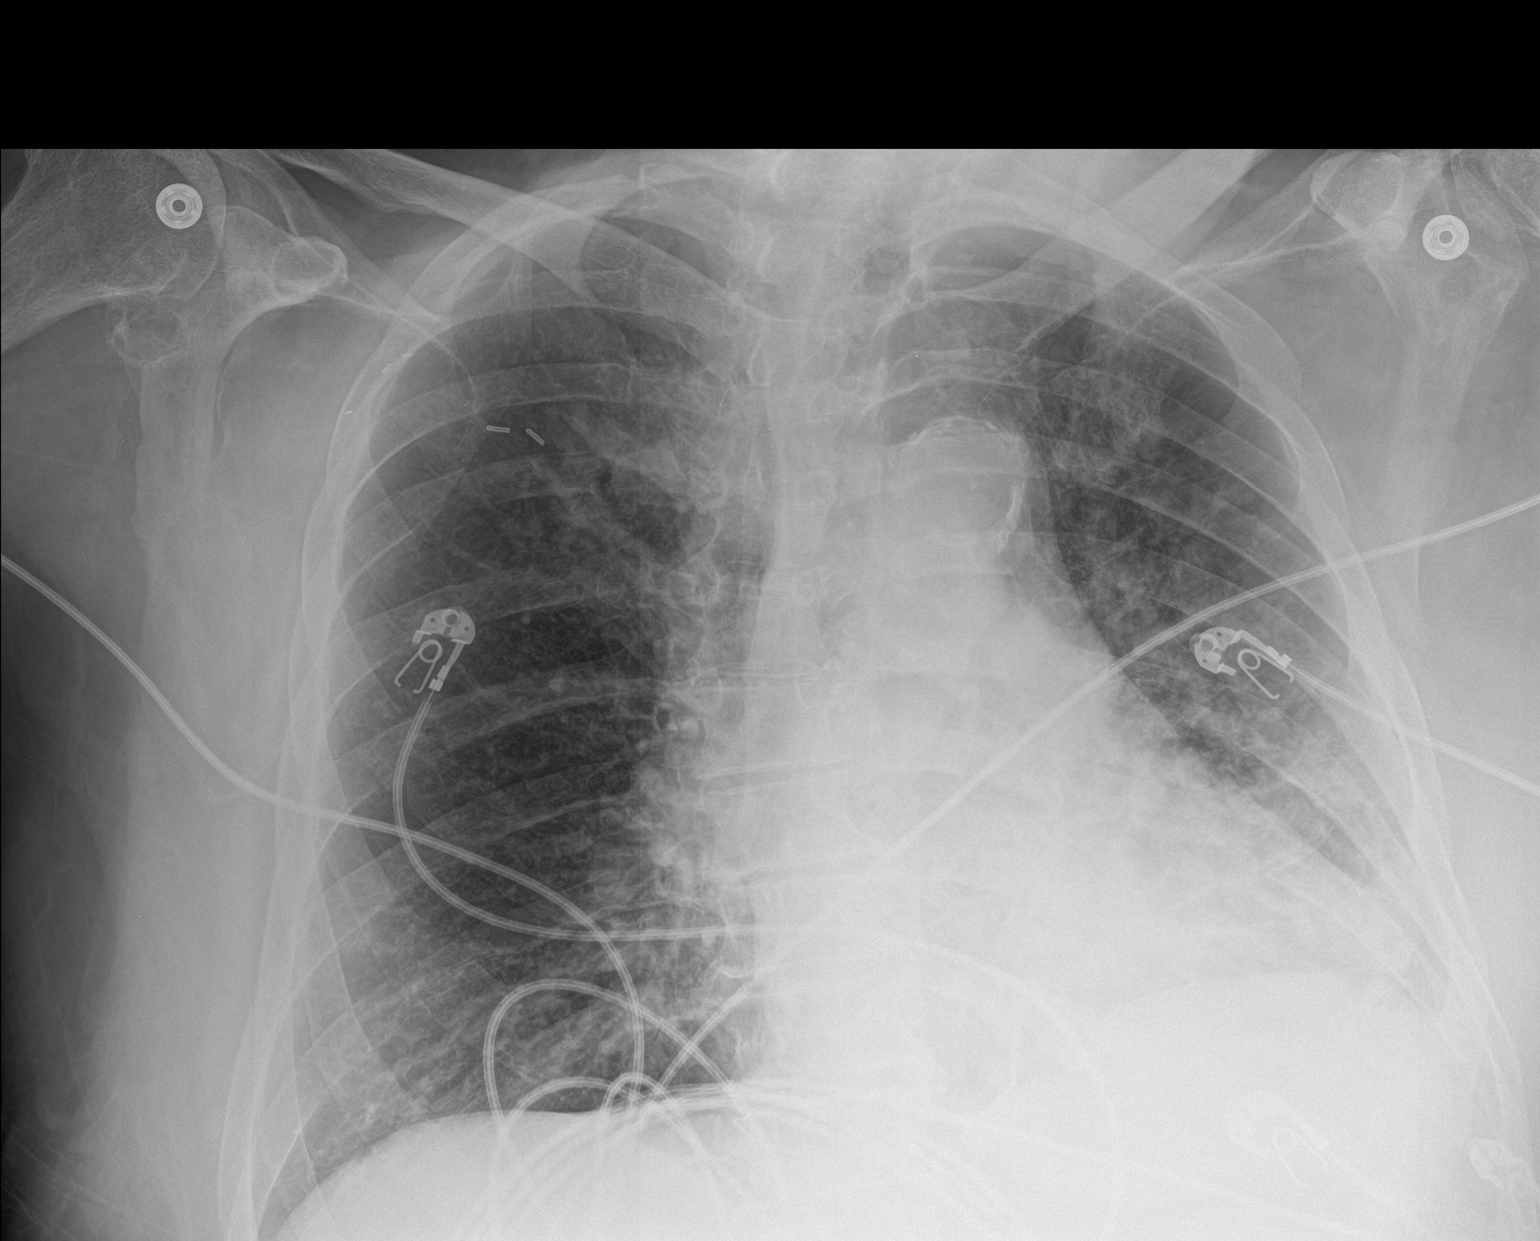

[1 of 1 positions shown; findings below may reference images not displayed]

FINDINGS: Stable mild cardiac enlargement, aortic atherosclerosis and
tortuosity of the thoracic aorta. Since the prior study, development
asymmetric pulmonary airspace opacity in the left lower lung zone
suspicious for pneumonia. There also is subtle increased opacity at
the right lung base consistent with atelectasis or infiltrate. No
overt edema or significant pleural effusions. No pneumothorax.
IMPRESSION: Development of asymmetric opacity in the left lower lung zone
suspicious for acute pneumonia. Mild right basilar atelectasis
versus infiltrate.
